# Patient Record
Sex: Female | Born: 1935
Health system: Southern US, Community
[De-identification: ages and names within clinical notes are randomized; demographics above are authoritative.]

## PROBLEM LIST (undated history)

## (undated) DIAGNOSIS — F0390 Unspecified dementia without behavioral disturbance: Secondary | ICD-10-CM

## (undated) DIAGNOSIS — I340 Nonrheumatic mitral (valve) insufficiency: Secondary | ICD-10-CM

## (undated) DIAGNOSIS — I4510 Unspecified right bundle-branch block: Secondary | ICD-10-CM

## (undated) DIAGNOSIS — I480 Paroxysmal atrial fibrillation: Secondary | ICD-10-CM

## (undated) DIAGNOSIS — I4892 Unspecified atrial flutter: Secondary | ICD-10-CM

## (undated) DIAGNOSIS — G4730 Sleep apnea, unspecified: Secondary | ICD-10-CM

## (undated) DIAGNOSIS — R0600 Dyspnea, unspecified: Secondary | ICD-10-CM

## (undated) DIAGNOSIS — I10 Essential (primary) hypertension: Secondary | ICD-10-CM

## (undated) DIAGNOSIS — J302 Other seasonal allergic rhinitis: Secondary | ICD-10-CM

## (undated) DIAGNOSIS — Z8619 Personal history of other infectious and parasitic diseases: Secondary | ICD-10-CM

## (undated) DIAGNOSIS — E7800 Pure hypercholesterolemia, unspecified: Secondary | ICD-10-CM

## (undated) DIAGNOSIS — I4891 Unspecified atrial fibrillation: Secondary | ICD-10-CM

## (undated) HISTORY — DX: Pure hypercholesterolemia, unspecified: E78.00

## (undated) HISTORY — DX: Unspecified atrial fibrillation: I48.91

## (undated) HISTORY — PX: ABDOMINAL HYSTERECTOMY: SHX81

## (undated) HISTORY — DX: Paroxysmal atrial fibrillation: I48.0

## (undated) HISTORY — DX: Unspecified atrial flutter: I48.92

## (undated) HISTORY — DX: Sleep apnea, unspecified: G47.30

## (undated) HISTORY — DX: Personal history of other infectious and parasitic diseases: Z86.19

## (undated) HISTORY — DX: Essential (primary) hypertension: I10

## (undated) HISTORY — DX: Nonrheumatic mitral (valve) insufficiency: I34.0

## (undated) HISTORY — PX: OTHER SURGICAL HISTORY: SHX169

## (undated) HISTORY — DX: Unspecified right bundle-branch block: I45.10

## (undated) HISTORY — DX: Other seasonal allergic rhinitis: J30.2

## (undated) HISTORY — DX: Dyspnea, unspecified: R06.00

---
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
## 1998-07-01 ENCOUNTER — Ambulatory Visit (HOSPITAL_COMMUNITY): Admission: RE | Admit: 1998-07-01 | Discharge: 1998-07-01 | Payer: Self-pay | Admitting: Specialist

## 1999-08-20 ENCOUNTER — Encounter: Admission: RE | Admit: 1999-08-20 | Discharge: 1999-08-20 | Payer: Self-pay | Admitting: Specialist

## 1999-08-20 ENCOUNTER — Encounter: Payer: Self-pay | Admitting: Specialist

## 1999-08-22 ENCOUNTER — Ambulatory Visit (HOSPITAL_BASED_OUTPATIENT_CLINIC_OR_DEPARTMENT_OTHER): Admission: RE | Admit: 1999-08-22 | Discharge: 1999-08-23 | Payer: Self-pay | Admitting: Specialist

## 1999-08-22 ENCOUNTER — Encounter (INDEPENDENT_AMBULATORY_CARE_PROVIDER_SITE_OTHER): Payer: Self-pay | Admitting: *Deleted

## 2006-09-09 ENCOUNTER — Ambulatory Visit: Payer: Self-pay | Admitting: Internal Medicine

## 2007-10-18 ENCOUNTER — Ambulatory Visit: Payer: Self-pay | Admitting: Internal Medicine

## 2007-10-20 ENCOUNTER — Ambulatory Visit: Payer: Self-pay | Admitting: Internal Medicine

## 2008-03-27 ENCOUNTER — Ambulatory Visit: Payer: Self-pay | Admitting: Internal Medicine

## 2008-03-31 ENCOUNTER — Ambulatory Visit: Payer: Self-pay | Admitting: Internal Medicine

## 2008-05-10 ENCOUNTER — Ambulatory Visit: Payer: Self-pay | Admitting: Internal Medicine

## 2008-12-31 ENCOUNTER — Ambulatory Visit: Payer: Self-pay | Admitting: Internal Medicine

## 2009-05-28 ENCOUNTER — Ambulatory Visit: Payer: Self-pay | Admitting: Internal Medicine

## 2010-03-06 ENCOUNTER — Inpatient Hospital Stay (HOSPITAL_COMMUNITY): Admission: EM | Admit: 2010-03-06 | Discharge: 2010-03-07 | Payer: Self-pay | Admitting: Emergency Medicine

## 2010-03-07 ENCOUNTER — Encounter (INDEPENDENT_AMBULATORY_CARE_PROVIDER_SITE_OTHER): Payer: Self-pay | Admitting: Emergency Medicine

## 2010-04-02 ENCOUNTER — Ambulatory Visit: Payer: Self-pay | Admitting: Cardiology

## 2010-06-06 ENCOUNTER — Ambulatory Visit: Payer: Self-pay | Admitting: Internal Medicine

## 2010-06-17 ENCOUNTER — Ambulatory Visit: Payer: Self-pay | Admitting: Internal Medicine

## 2010-08-05 ENCOUNTER — Ambulatory Visit: Payer: Self-pay | Admitting: Cardiology

## 2010-11-01 LAB — POCT I-STAT, CHEM 8
BUN: 10 mg/dL (ref 6–23)
Calcium, Ion: 1.07 mmol/L — ABNORMAL LOW (ref 1.12–1.32)
Chloride: 110 mEq/L (ref 96–112)
Creatinine, Ser: 0.7 mg/dL (ref 0.4–1.2)
Glucose, Bld: 105 mg/dL — ABNORMAL HIGH (ref 70–99)
HCT: 45 % (ref 36.0–46.0)
Hemoglobin: 15.3 g/dL — ABNORMAL HIGH (ref 12.0–15.0)
Potassium: 3.9 mEq/L (ref 3.5–5.1)
Sodium: 140 mEq/L (ref 135–145)
TCO2: 23 mmol/L (ref 0–100)

## 2010-11-01 LAB — CK TOTAL AND CKMB (NOT AT ARMC)
CK, MB: 1 ng/mL (ref 0.3–4.0)
Relative Index: INVALID (ref 0.0–2.5)

## 2010-11-01 LAB — LIPID PANEL
Cholesterol: 148 mg/dL (ref 0–200)
HDL: 39 mg/dL — ABNORMAL LOW (ref >39)
LDL Cholesterol: 71 mg/dL (ref 0–99)
Triglycerides: 188 mg/dL — ABNORMAL HIGH (ref <150)

## 2010-11-01 LAB — POCT CARDIAC MARKERS
CKMB, poc: <1 ng/mL — ABNORMAL LOW (ref 1.0–8.0)
Myoglobin, poc: 69.2 ng/mL (ref 12–200)
Troponin i, poc: <0.05 ng/mL (ref 0.00–0.09)

## 2010-11-01 LAB — CARDIAC PANEL(CRET KIN+CKTOT+MB+TROPI)
CK, MB: 0.8 ng/mL (ref 0.3–4.0)
CK, MB: 0.9 ng/mL (ref 0.3–4.0)
Total CK: 48 U/L (ref 7–177)
Troponin I: 0.01 ng/mL (ref 0.00–0.06)

## 2010-11-01 LAB — CBC
HCT: 42.5 % (ref 36.0–46.0)
HCT: 44.6 % (ref 36.0–46.0)
Hemoglobin: 14.6 g/dL (ref 12.0–15.0)
Hemoglobin: 15.6 g/dL — ABNORMAL HIGH (ref 12.0–15.0)
MCH: 32.4 pg (ref 26.0–34.0)
MCH: 32.7 pg (ref 26.0–34.0)
MCHC: 35 g/dL (ref 30.0–36.0)
MCV: 93.6 fL (ref 78.0–100.0)
MCV: 93.9 fL (ref 78.0–100.0)
Platelets: 181 10*3/uL (ref 150–400)
Platelets: 187 10*3/uL (ref 150–400)
RBC: 4.52 MIL/uL (ref 3.87–5.11)
RBC: 4.76 MIL/uL (ref 3.87–5.11)
RDW: 12.5 % (ref 11.5–15.5)
WBC: 6.1 10*3/uL (ref 4.0–10.5)
WBC: 7 10*3/uL (ref 4.0–10.5)

## 2010-11-01 LAB — COMPREHENSIVE METABOLIC PANEL
ALT: 33 U/L (ref 0–35)
AST: 35 U/L (ref 0–37)
Albumin: 3.9 g/dL (ref 3.5–5.2)
CO2: 21 mEq/L (ref 19–32)
Calcium: 8.9 mg/dL (ref 8.4–10.5)
Creatinine, Ser: 0.64 mg/dL (ref 0.4–1.2)
GFR calc Af Amer: >60 mL/min (ref >60)
Sodium: 137 mEq/L (ref 135–145)
Total Protein: 6.5 g/dL (ref 6.0–8.3)

## 2011-03-30 ENCOUNTER — Telehealth: Payer: Self-pay | Admitting: Cardiology

## 2011-03-30 NOTE — Telephone Encounter (Signed)
Called wanting to know when her app should be. Lm that she was suppose to have been seen in June. LM that she needs an app for Nov or Dec as a 57yr.

## 2011-03-30 NOTE — Telephone Encounter (Signed)
Please call pt back whether she needs to make an appt for a 6 M OV soon or should she wait until a year, pt states was last seen in December, chart in box

## 2011-04-01 ENCOUNTER — Other Ambulatory Visit: Payer: Self-pay | Admitting: Cardiology

## 2011-04-01 NOTE — Telephone Encounter (Signed)
escribe medication per fax request  

## 2011-06-29 ENCOUNTER — Ambulatory Visit: Payer: Self-pay | Admitting: Cardiology

## 2011-07-14 ENCOUNTER — Ambulatory Visit: Payer: Self-pay | Admitting: Internal Medicine

## 2011-07-22 ENCOUNTER — Encounter: Payer: Self-pay | Admitting: Cardiology

## 2011-07-22 ENCOUNTER — Ambulatory Visit (INDEPENDENT_AMBULATORY_CARE_PROVIDER_SITE_OTHER): Payer: Medicare Other | Admitting: Cardiology

## 2011-07-22 VITALS — BP 122/68 | HR 74 | Ht 66.0 in | Wt 191.0 lb

## 2011-07-22 DIAGNOSIS — E7800 Pure hypercholesterolemia, unspecified: Secondary | ICD-10-CM

## 2011-07-22 DIAGNOSIS — I480 Paroxysmal atrial fibrillation: Secondary | ICD-10-CM

## 2011-07-22 DIAGNOSIS — I4510 Unspecified right bundle-branch block: Secondary | ICD-10-CM

## 2011-07-22 DIAGNOSIS — I10 Essential (primary) hypertension: Secondary | ICD-10-CM

## 2011-07-22 DIAGNOSIS — I4891 Unspecified atrial fibrillation: Secondary | ICD-10-CM

## 2011-07-22 NOTE — Assessment & Plan Note (Signed)
Blood pressure is well controlled today. She will continue with her current medications.

## 2011-07-22 NOTE — Assessment & Plan Note (Signed)
No known recurrence of the atrial fibrillation since July of 2011. It is interesting to note that she has been diagnosed with sleep apnea and this may potentially have been a trigger for her atrophic fibrillation. We will continue with her diltiazem therapy. I'll followup again in one month.

## 2011-07-22 NOTE — Patient Instructions (Signed)
You need to get more aerobic exercise.  Continue your current medications.  I will see you again in 1 year

## 2011-07-22 NOTE — Progress Notes (Signed)
   Ned Clines Date of Birth: November 03, 1933 Medical Record #[id number]  History of Present Illness: Mrs. Pettitt is seen for yearly followup. She has a history of paroxysmal atrial fibrillation. Her last known occurrence was in July of 2011. She also has a history of hypertension and hypercholesterolemia. Since her visit last year she has been diagnosed with sleep apnea. She is now CPAP and oxygen therapy at night. She reports some chest and sinus congestion related to this. She has had no symptoms of palpitations or tachycardia. She has noted some periods of shortness of breath over the past couple of weeks but this resolves with Xanax. She has been under a lot of stress at work. She did have an echocardiogram in July of 2011 which was normal except for mild mitral insufficiency.  Current Outpatient Prescriptions on File Prior to Visit  Medication Sig Dispense Refill  . diltiazem (CARDIZEM CD) 240 MG 24 hr capsule TAKE 1 CAPSULE EVERY DAY  30 capsule  5    Allergies  Allergen Reactions  . Codeine     Past Medical History  Diagnosis Date  . Atrial fibrillation     paroxysmal  . Hypertension   . Hypercholesterolemia   . Mitral insufficiency     Mild mitral insufficiency  . Dyspnea   . Valvular heart disease     of unknown type  . Seasonal allergies   . Sleep apnea     Past Surgical History  Procedure Date  . Abdominal hysterectomy   . Tummy tuck     History  Smoking status  . Never Smoker   Smokeless tobacco  . Not on file    History  Alcohol Use     Family History  Problem Relation Age of Onset  . Stroke Mother   . Heart attack Mother   . Diabetes Sister     Review of Systems: As noted in history of present illness.  All other systems were reviewed and are negative.  Physical Exam: BP 122/68  Pulse 74  Ht 5\' 6"  (1.676 m)  Wt 86.637 kg (191 lb)  BMI 30.83 kg/m2 She is an overweight white female in no acute distress.The patient is alert and oriented  x 3.  The mood and affect are normal.  The skin is warm and dry.  Color is normal.  The HEENT exam reveals that the sclera are nonicteric.  The mucous membranes are moist.  The carotids are 2+ without bruits.  There is no thyromegaly.  There is no JVD.  The lungs are clear.  The chest wall is non tender.  The heart exam reveals a regular rate with a normal S1 and S2.  There are no murmurs, gallops, or rubs.  The PMI is not displaced.   Abdominal exam reveals good bowel sounds.  There is no guarding or rebound.  There is no hepatosplenomegaly or tenderness.  There are no masses.  Exam of the legs reveal no clubbing, cyanosis, or edema.  The legs are without rashes.  The distal pulses are intact.  Cranial nerves II - XII are intact.  Motor and sensory functions are intact.  The gait is normal.  LABORATORY DATA:  ECG demonstrates normal sinus rhythm with a chronic right bundle branch block. Assessment / Plan:

## 2011-10-09 ENCOUNTER — Telehealth: Payer: Self-pay | Admitting: Cardiology

## 2011-10-09 NOTE — Telephone Encounter (Signed)
New Problem   Patient request call concerning RX given to her by Chiropractor, she can be reached at hm# [phone]

## 2011-10-09 NOTE — Telephone Encounter (Signed)
Patient called, stated wanted to check with Dr.Jordan to see if okay to take 2 new meds prescribed by her chiropractor.Alpha-Lipoic Acid 100mg  3 times daily,and QH Absorbs 1 tablet 3 times daily (reduce form of Co Q 10) Advised will check with Dr.Jordan and call her back.

## 2011-10-13 NOTE — Telephone Encounter (Signed)
Patient called was told spoke with Dr.Jordan okay to take Alpha-lipoic acid and QH absorbs.

## 2011-10-28 ENCOUNTER — Other Ambulatory Visit: Payer: Self-pay

## 2011-10-28 MED ORDER — DILTIAZEM HCL ER COATED BEADS 240 MG PO CP24: 240.0000 mg | ORAL_CAPSULE | Freq: Every day | ORAL | Status: DC

## 2012-03-17 ENCOUNTER — Emergency Department: Payer: Self-pay | Admitting: *Deleted

## 2012-03-17 LAB — CBC
HCT: 39.3 % (ref 35.0–47.0)
MCV: 91 fL (ref 80–100)
Platelet: 202 10*3/uL (ref 150–440)
RDW: 12.5 % (ref 11.5–14.5)
WBC: 6.5 10*3/uL (ref 3.6–11.0)

## 2012-03-17 LAB — COMPREHENSIVE METABOLIC PANEL
Anion Gap: 9 (ref 7–16)
Calcium, Total: 9.3 mg/dL (ref 8.5–10.1)
Chloride: 102 mmol/L (ref 98–107)
Co2: 27 mmol/L (ref 21–32)
EGFR (African American): >60
EGFR (Non-African Amer.): 53 — ABNORMAL LOW
Glucose: 96 mg/dL (ref 65–99)
Potassium: 3.8 mmol/L (ref 3.5–5.1)
SGOT(AST): 36 U/L (ref 15–37)
Sodium: 138 mmol/L (ref 136–145)

## 2012-03-17 LAB — TROPONIN I: Troponin-I: <0.02 ng/mL

## 2012-05-04 ENCOUNTER — Emergency Department: Payer: Self-pay | Admitting: Emergency Medicine

## 2012-05-05 LAB — CBC WITH DIFFERENTIAL/PLATELET
Basophil #: 0.1 10*3/uL (ref 0.0–0.1)
Basophil %: 1.1 %
Eosinophil #: 0.3 10*3/uL (ref 0.0–0.7)
Eosinophil %: 5.9 %
HGB: 14.9 g/dL (ref 12.0–16.0)
Lymphocyte #: 1.2 10*3/uL (ref 1.0–3.6)
MCH: 32.8 pg (ref 26.0–34.0)
MCHC: 35.5 g/dL (ref 32.0–36.0)
MCV: 93 fL (ref 80–100)
Monocyte #: 0.5 x10 3/mm (ref 0.2–0.9)
Neutrophil %: 59.8 %
Platelet: 193 10*3/uL (ref 150–440)
RBC: 4.53 10*6/uL (ref 3.80–5.20)
RDW: 12.7 % (ref 11.5–14.5)
WBC: 5.2 10*3/uL (ref 3.6–11.0)

## 2012-05-05 LAB — CK TOTAL AND CKMB (NOT AT ARMC)
CK, Total: 37 U/L (ref 21–215)
CK-MB: <0.5 ng/mL — ABNORMAL LOW (ref 0.5–3.6)

## 2012-05-05 LAB — PROTIME-INR
INR: 1
Prothrombin Time: 13.2 secs (ref 11.5–14.7)

## 2012-05-05 LAB — URINALYSIS, COMPLETE
Glucose,UR: NEGATIVE mg/dL (ref 0–75)
Nitrite: NEGATIVE
Ph: 6 (ref 4.5–8.0)
Protein: NEGATIVE
RBC,UR: 8 /HPF (ref 0–5)
WBC UR: 8 /HPF (ref 0–5)

## 2012-05-05 LAB — COMPREHENSIVE METABOLIC PANEL
Alkaline Phosphatase: 81 U/L (ref 50–136)
Anion Gap: 9 (ref 7–16)
BUN: 13 mg/dL (ref 7–18)
Bilirubin,Total: 0.4 mg/dL (ref 0.2–1.0)
Calcium, Total: 9.6 mg/dL (ref 8.5–10.1)
Chloride: 104 mmol/L (ref 98–107)
Co2: 29 mmol/L (ref 21–32)
EGFR (Non-African Amer.): >60
Glucose: 126 mg/dL — ABNORMAL HIGH (ref 65–99)
Osmolality: 285 (ref 275–301)
Potassium: 3.6 mmol/L (ref 3.5–5.1)
Sodium: 142 mmol/L (ref 136–145)

## 2012-05-05 LAB — TROPONIN I: Troponin-I: <0.02 ng/mL

## 2012-07-19 ENCOUNTER — Ambulatory Visit: Payer: Self-pay | Admitting: Physician Assistant

## 2012-08-16 ENCOUNTER — Telehealth: Payer: Self-pay

## 2012-08-16 NOTE — Telephone Encounter (Signed)
Patient called was told received refill request for HCT 12.5 mg from [facility].Patient stated she is not taking.Also patient was told needs appointment with Dr.Jordan.Appointment scheduled with Dr.Jordan 09/14/12.

## 2012-09-14 ENCOUNTER — Ambulatory Visit: Payer: Medicare Other | Admitting: Cardiology

## 2013-05-14 ENCOUNTER — Inpatient Hospital Stay: Payer: Self-pay | Admitting: Internal Medicine

## 2013-05-14 DIAGNOSIS — R0602 Shortness of breath: Secondary | ICD-10-CM

## 2013-05-14 DIAGNOSIS — I4892 Unspecified atrial flutter: Secondary | ICD-10-CM

## 2013-05-14 DIAGNOSIS — I509 Heart failure, unspecified: Secondary | ICD-10-CM

## 2013-05-14 DIAGNOSIS — I10 Essential (primary) hypertension: Secondary | ICD-10-CM

## 2013-05-14 DIAGNOSIS — I059 Rheumatic mitral valve disease, unspecified: Secondary | ICD-10-CM

## 2013-05-14 LAB — BASIC METABOLIC PANEL
Anion Gap: 3 — ABNORMAL LOW (ref 7–16)
Chloride: 107 mmol/L (ref 98–107)
Creatinine: 1.04 mg/dL (ref 0.60–1.30)
EGFR (African American): >60

## 2013-05-14 LAB — CK TOTAL AND CKMB (NOT AT ARMC)
CK, Total: 71 U/L (ref 21–215)
CK-MB: 1.9 ng/mL (ref 0.5–3.6)

## 2013-05-14 LAB — CBC
HGB: 15.4 g/dL (ref 12.0–16.0)
MCH: 30.8 pg (ref 26.0–34.0)
MCHC: 33.2 g/dL (ref 32.0–36.0)
MCV: 93 fL (ref 80–100)
Platelet: 183 10*3/uL (ref 150–440)
RBC: 4.99 10*6/uL (ref 3.80–5.20)
RDW: 13.7 % (ref 11.5–14.5)
WBC: 5.7 10*3/uL (ref 3.6–11.0)

## 2013-05-14 LAB — MAGNESIUM: Magnesium: 1.6 mg/dL — ABNORMAL LOW

## 2013-05-14 LAB — TROPONIN I
Troponin-I: <0.02 ng/mL
Troponin-I: <0.02 ng/mL
Troponin-I: <0.02 ng/mL

## 2013-05-14 LAB — CK
CK, Total: 45 U/L (ref 21–215)
CK, Total: 42 U/L (ref 21–215)

## 2013-05-14 LAB — PRO B NATRIURETIC PEPTIDE: B-Type Natriuretic Peptide: 2418 pg/mL — ABNORMAL HIGH (ref 0–450)

## 2013-05-14 LAB — TSH: Thyroid Stimulating Horm: 2.24 u[IU]/mL

## 2013-05-15 LAB — CBC WITH DIFFERENTIAL/PLATELET
Basophil %: 1.3 %
Eosinophil %: 5.1 %
HCT: 43.6 % (ref 35.0–47.0)
HGB: 14.6 g/dL (ref 12.0–16.0)
MCH: 30.9 pg (ref 26.0–34.0)
MCHC: 33.5 g/dL (ref 32.0–36.0)
MCV: 92 fL (ref 80–100)
Monocyte #: 0.4 x10 3/mm (ref 0.2–0.9)
Monocyte %: 9.1 %
Neutrophil #: 2.6 10*3/uL (ref 1.4–6.5)
Neutrophil %: 53.7 %
Platelet: 171 10*3/uL (ref 150–440)
RBC: 4.73 10*6/uL (ref 3.80–5.20)

## 2013-05-15 LAB — MAGNESIUM: Magnesium: 1.9 mg/dL

## 2013-05-15 LAB — HEPATIC FUNCTION PANEL A (ARMC)
Albumin: 3.6 g/dL (ref 3.4–5.0)
SGPT (ALT): 31 U/L (ref 12–78)
Total Protein: 7 g/dL (ref 6.4–8.2)

## 2013-05-15 LAB — BASIC METABOLIC PANEL
BUN: 11 mg/dL (ref 7–18)
Calcium, Total: 9.5 mg/dL (ref 8.5–10.1)
Chloride: 105 mmol/L (ref 98–107)
Co2: 30 mmol/L (ref 21–32)
Creatinine: 1.25 mg/dL (ref 0.60–1.30)
EGFR (African American): 48 — ABNORMAL LOW
EGFR (Non-African Amer.): 41 — ABNORMAL LOW
Glucose: 92 mg/dL (ref 65–99)
Potassium: 3.7 mmol/L (ref 3.5–5.1)
Sodium: 139 mmol/L (ref 136–145)

## 2013-05-16 LAB — LIPASE, BLOOD: Lipase: 349 U/L (ref 73–393)

## 2013-05-17 DIAGNOSIS — I4891 Unspecified atrial fibrillation: Secondary | ICD-10-CM

## 2013-05-26 ENCOUNTER — Encounter: Payer: Self-pay | Admitting: Cardiovascular Disease

## 2013-05-26 ENCOUNTER — Ambulatory Visit (INDEPENDENT_AMBULATORY_CARE_PROVIDER_SITE_OTHER): Payer: Medicare Other | Admitting: Cardiovascular Disease

## 2013-05-26 VITALS — BP 120/62 | HR 51 | Ht 66.5 in | Wt 173.2 lb

## 2013-05-26 DIAGNOSIS — E7800 Pure hypercholesterolemia, unspecified: Secondary | ICD-10-CM

## 2013-05-26 DIAGNOSIS — R0989 Other specified symptoms and signs involving the circulatory and respiratory systems: Secondary | ICD-10-CM

## 2013-05-26 DIAGNOSIS — R0602 Shortness of breath: Secondary | ICD-10-CM

## 2013-05-26 DIAGNOSIS — I10 Essential (primary) hypertension: Secondary | ICD-10-CM

## 2013-05-26 DIAGNOSIS — E785 Hyperlipidemia, unspecified: Secondary | ICD-10-CM

## 2013-05-26 DIAGNOSIS — R000 Tachycardia, unspecified: Secondary | ICD-10-CM

## 2013-05-26 DIAGNOSIS — I4891 Unspecified atrial fibrillation: Secondary | ICD-10-CM

## 2013-05-26 NOTE — Assessment & Plan Note (Signed)
Blood pressure is well controlled on today's visit. No changes made to the medications. 

## 2013-05-26 NOTE — Assessment & Plan Note (Signed)
We have suggested that she had lab work done at her convenience given strong family history of CAD and PVD.

## 2013-05-26 NOTE — Assessment & Plan Note (Signed)
She is maintaining normal sinus rhythm today. Suspect recent episode was triggered by upper respiratory infection. We have suggested she cut her metoprolol succinate in half and take a half pill twice a day , decrease amiodarone down to 1 pill per day, hold her digoxin and stay on her anticoagulation for one more month. We have suggested she closely monitor her heart rate at home. If she has additional episodes of tachycardia concerning for atrial fibrillation or flutter, that she call he office.

## 2013-05-26 NOTE — Progress Notes (Signed)
Patient ID: Jill Snow, female    DOB: 1933/09/16, 79 y.o.   MRN: [id number]  HPI Comments: Jill Snow has a history of paroxysmal atrial fibrillation, episode in July 2011 , also history of hypertension and hypercholesterolemia, diagnosed with with sleep apnea, uses nasal pillow CPAP and oxygen therapy at night.  Recent hospitalization 05/14/2013 for palpitations, shortness of breath with tachycardia. Jill Snow had upper respiratory infection and was found to be in atrial flutter. Jill Snow was given IV Cardizem with minimal improvement of her heart rate. Jill Snow was hypotensive and required several days in the hospital for management. Jill Snow ended up needing TEE and cardioversion as rate was difficult to control despite metoprolol, aspirin channel blocker and digoxin. Jill Snow was started on anticoagulation at that time.   Jill Snow presents in followup today and reports that Jill Snow is feeling better. Jill Snow was initially week and now is back to work, feeling more like herself Jill Snow has not been measuring her heart rate or blood pressure at home Jill Snow did have some confusion while in the hospital. This has resolved at home.  TEE dated 05/16/2013 showed ejection fraction 45-50%, mildly dilated left and right atrium, moderate mitral valve regurgitation noted  Chest x-ray in the hospital did show small bilateral pleural effusions  EKG today shows sinus bradycardia, right bundle branch block, rate 51 beats per minute, nonspecific ST and T wave abnormality in lead 3, aVF   Outpatient Encounter Prescriptions as of 05/26/2013  Medication Sig Dispense Refill  . ALPRAZolam (XANAX) 0.25 MG tablet Take 0.25 mg by mouth at bedtime as needed.        Marland Kitchen amiodarone (PACERONE) 200 MG tablet Take 200 mg by mouth 2 (two) times daily.      Marland Kitchen apixaban (ELIQUIS) 5 MG TABS tablet Take 5 mg by mouth every 12 (twelve) hours.      . fenofibrate (TRICOR) 145 MG tablet Take 145 mg by mouth daily.      . fluticasone (FLONASE) 50 MCG/ACT nasal  spray Place 2 sprays into the nose daily.      . furosemide (LASIX) 20 MG tablet Take 20 mg by mouth as directed. [place], [place]. And Fri.      . metoprolol succinate (TOPROL-XL) 50 MG 24 hr tablet Take 50 mg by mouth daily. Take with or immediately following a meal.      . montelukast (SINGULAIR) 10 MG tablet Take 10 mg by mouth at bedtime.      . digoxin (LANOXIN) 0.125 MG tablet Take 0.125 mg by mouth daily.        Review of Systems  Constitutional: Negative.   HENT: Negative.   Eyes: Negative.   Respiratory: Negative.   Cardiovascular: Negative.   Gastrointestinal: Negative.   Endocrine: Negative.   Musculoskeletal: Negative.   Skin: Negative.   Allergic/Immunologic: Negative.   Neurological: Negative.   Hematological: Negative.   Psychiatric/Behavioral: Negative.     BP 120/62  Pulse 51  Ht 5' 6.5" (1.689 m)  Wt 173 lb 4 oz (78.586 kg)  BMI 27.55 kg/m2  Physical Exam  Nursing note and vitals reviewed. Constitutional: Jill Snow is oriented to person, place, and time. Jill Snow appears well-developed and well-nourished.  HENT:  Head: Normocephalic.  Nose: Nose normal.  Mouth/Throat: Oropharynx is clear and moist.  Eyes: Conjunctivae are normal. Pupils are equal, round, and reactive to light.  Neck: Normal range of motion. Neck supple. No JVD present. Carotid bruit is present.  Cardiovascular: Normal rate, regular rhythm, S1 normal, S2  normal, normal heart sounds and intact distal pulses.  Exam reveals no gallop and no friction rub.   No murmur heard. Pulmonary/Chest: Effort normal and breath sounds normal. No respiratory distress. Jill Snow has no wheezes. Jill Snow has no rales. Jill Snow exhibits no tenderness.  Abdominal: Soft. Bowel sounds are normal. Jill Snow exhibits no distension. There is no tenderness.  Musculoskeletal: Normal range of motion. Jill Snow exhibits no edema and no tenderness.  Lymphadenopathy:    Jill Snow has no cervical adenopathy.  Neurological: Jill Snow is alert and oriented to person, place,  and time. Coordination normal.  Skin: Skin is warm and dry. No rash noted. No erythema.  Psychiatric: Jill Snow has a normal mood and affect. Her behavior is normal. Judgment and thought content normal.    Assessment and Plan

## 2013-05-26 NOTE — Patient Instructions (Addendum)
You are doing well. Please cut the metoprolol and take 1/2 in the am and 1/2 in the PM Decrease the amiodarone to one a day Stop the digoxin Stay on the eliquis for one more month  Call the office if you have tachycardia/fast heart rate  We will schedule you for a carotid ultrasound  Please call us if you have new issues that need to be addressed before your next appt.  Your physician wants you to follow-up in: 6 months.  You will receive a reminder letter in the mail two months in advance. If you don't receive a letter, please call our office to schedule the follow-up appointment.

## 2013-05-29 ENCOUNTER — Ambulatory Visit: Payer: Medicare Other

## 2013-05-29 ENCOUNTER — Ambulatory Visit (INDEPENDENT_AMBULATORY_CARE_PROVIDER_SITE_OTHER): Payer: Medicare Other | Admitting: *Deleted

## 2013-05-29 DIAGNOSIS — E785 Hyperlipidemia, unspecified: Secondary | ICD-10-CM

## 2013-05-29 DIAGNOSIS — I4891 Unspecified atrial fibrillation: Secondary | ICD-10-CM

## 2013-05-29 NOTE — Progress Notes (Signed)
Pt came in today complaining of "no energy" and low heart rate.  Pt's metoprolol was decreased on her office visit 05/26/13 from 50mg  once daily to 1/2 tab in am and 1/2 tab in pm. Pt states that symptoms have not improved over the weekend.  EKG shows heart rate at 52. Spoke w/ Dr. Mariah Milling and instructed pt to decrease her metoprolol succinate down to 1/2 pill in the evening. Pt will monitor her symptoms and heart rate and will let us know if symptoms have not improved by next week.

## 2013-05-30 ENCOUNTER — Encounter: Payer: Medicare Other | Admitting: Cardiovascular Disease

## 2013-05-30 ENCOUNTER — Telehealth: Payer: Self-pay | Admitting: *Deleted

## 2013-05-30 LAB — LIPID PANEL
Cholesterol, Total: 224 mg/dL — ABNORMAL HIGH (ref 100–199)
Triglycerides: 472 mg/dL — ABNORMAL HIGH (ref 0–149)

## 2013-05-30 NOTE — Telephone Encounter (Signed)
Patient wanted to check with Dr. Mariah Milling and make sure it was ok for her to receive the flu shot?

## 2013-05-30 NOTE — Telephone Encounter (Signed)
Spoke w/ pt.  Encouraged her to seek the flu shot.   Told her to answer questions appropriately before getting it.

## 2013-06-02 ENCOUNTER — Other Ambulatory Visit: Payer: Self-pay | Admitting: Physician Assistant

## 2013-06-02 ENCOUNTER — Telehealth: Payer: Self-pay

## 2013-06-02 DIAGNOSIS — E785 Hyperlipidemia, unspecified: Secondary | ICD-10-CM

## 2013-06-02 MED ORDER — ATORVASTATIN CALCIUM 10 MG PO TABS: 10.0000 mg | ORAL_TABLET | Freq: Every day | ORAL | Status: DC

## 2013-06-02 NOTE — Telephone Encounter (Signed)
Spoke w/ pt.  She is aware of results.   She is agreeable with plan of starting atorvastatin 10 mg daily and having labs in 6 weeks.

## 2013-06-02 NOTE — Telephone Encounter (Signed)
Message copied by Marilynne Halsted on Fri Jun 02, 2013  4:02 PM ------      Message from: Odella Aquas A      Created: Fri Jun 02, 2013  3:46 PM       Markedly elevated TGs despite fibrate use. Given likely atherosclerosis (+ carotid bruit on exam per recent office note) would recommend adding at least moderate potency statin. Start atorvastatin 10mg  daily. Check LFTs and repeat lipid panel in 6 weeks. ------

## 2013-06-05 ENCOUNTER — Encounter: Payer: Self-pay | Admitting: *Deleted

## 2013-06-12 ENCOUNTER — Encounter: Payer: Self-pay | Admitting: *Deleted

## 2013-06-13 ENCOUNTER — Encounter: Payer: Self-pay | Admitting: Cardiovascular Disease

## 2013-06-22 ENCOUNTER — Encounter (INDEPENDENT_AMBULATORY_CARE_PROVIDER_SITE_OTHER): Payer: Medicare Other

## 2013-06-22 DIAGNOSIS — I6529 Occlusion and stenosis of unspecified carotid artery: Secondary | ICD-10-CM

## 2013-06-22 DIAGNOSIS — R0989 Other specified symptoms and signs involving the circulatory and respiratory systems: Secondary | ICD-10-CM

## 2013-06-26 ENCOUNTER — Telehealth: Payer: Self-pay

## 2013-06-26 NOTE — Telephone Encounter (Signed)
Message copied by Marilynne Halsted on Mon Jun 26, 2013  4:22 PM ------      Message from: Antonieta Iba      Created: Sat Jun 24, 2013  9:16 PM       Mild carotid disease bilateral  1 - 39%      Medical management ------

## 2013-06-26 NOTE — Telephone Encounter (Signed)
Spoke w/ pt.  She is aware of results and is agreeable to plan.  Reports that she finished her Eliquis and per Dr. Windell Hummingbird instructions on her last visit, she will not have that refilled.

## 2013-07-10 ENCOUNTER — Ambulatory Visit (INDEPENDENT_AMBULATORY_CARE_PROVIDER_SITE_OTHER): Payer: Medicare Other | Admitting: Internal Medicine

## 2013-07-10 ENCOUNTER — Encounter: Payer: Self-pay | Admitting: Internal Medicine

## 2013-07-10 VITALS — BP 126/78 | HR 72 | Temp 98.0°F | Ht 64.25 in | Wt 176.2 lb

## 2013-07-10 DIAGNOSIS — H6122 Impacted cerumen, left ear: Secondary | ICD-10-CM

## 2013-07-10 DIAGNOSIS — E7800 Pure hypercholesterolemia, unspecified: Secondary | ICD-10-CM

## 2013-07-10 DIAGNOSIS — H6120 Impacted cerumen, unspecified ear: Secondary | ICD-10-CM

## 2013-07-10 DIAGNOSIS — I10 Essential (primary) hypertension: Secondary | ICD-10-CM

## 2013-07-10 DIAGNOSIS — I4891 Unspecified atrial fibrillation: Secondary | ICD-10-CM

## 2013-07-10 NOTE — Assessment & Plan Note (Signed)
Lipids reviewed

## 2013-07-10 NOTE — Progress Notes (Signed)
Pre-visit discussion using our clinic review tool. No additional management support is needed unless otherwise documented below in the visit note.  

## 2013-07-10 NOTE — Assessment & Plan Note (Signed)
Well controlled Continue current therapy 

## 2013-07-10 NOTE — Patient Instructions (Signed)
Cerumen Impaction A cerumen impaction is when the wax in your ear forms a plug. This plug usually causes reduced hearing. Sometimes it also causes an earache or dizziness. Removing a cerumen impaction can be difficult and painful. The wax sticks to the ear canal. The canal is sensitive and bleeds easily. If you try to remove a heavy wax buildup with a cotton tipped swab, you may push it in further. Irrigation with water, suction, and small ear curettes may be used to clear out the wax. If the impaction is fixed to the skin in the ear canal, ear drops may be needed for a few days to loosen the wax. People who build up a lot of wax frequently can use ear wax removal products available in your local drugstore. SEEK MEDICAL CARE IF:  You develop an earache, increased hearing loss, or marked dizziness. Document Released: 09/10/2004 Document Revised: 10/26/2011 Document Reviewed: 10/31/2009 ExitCare Patient Information 2014 [employer].  

## 2013-07-10 NOTE — Progress Notes (Signed)
HPI  Pt presents to the clinic today to establish care. She is transferring care from Dr. Welton Flakes. She was dismissed from that practice for failure to comply with there treatment regimen. The last time she was seen there was 04/2013. She does see Dr. Mariah Milling for her heart related conditions  Flu: yearly 05/2013 Tetanus: unsure of date Pneumovax: unsure of date Pap Smear: 04/2013 Mammogram: due 07/2013 [facility]) Colonoscopy: never  Eye Doctor: as needed (2012) Dentist: biannually  Past Medical History  Diagnosis Date  . Atrial fibrillation     paroxysmal  . Hypertension   . Hypercholesterolemia   . Mitral insufficiency     Mild mitral insufficiency  . Dyspnea   . Seasonal allergies   . Sleep apnea   . RBBB (right bundle branch block)   . History of chicken pox   . Arrhythmia     Current Outpatient Prescriptions  Medication Sig Dispense Refill  . atorvastatin (LIPITOR) 10 MG tablet Take 1 tablet (10 mg total) by mouth daily.  90 tablet  3  . fluticasone (FLONASE) 50 MCG/ACT nasal spray Place 2 sprays into the nose daily.      . furosemide (LASIX) 20 MG tablet Take 20 mg by mouth as directed. [place], [place]. And Fri.      . ALPRAZolam (XANAX) 0.25 MG tablet Take 0.25 mg by mouth at bedtime as needed.        Marland Kitchen amiodarone (PACERONE) 200 MG tablet Take 200 mg by mouth 2 (two) times daily.      Marland Kitchen apixaban (ELIQUIS) 5 MG TABS tablet Take 5 mg by mouth every 12 (twelve) hours.      . fenofibrate (TRICOR) 145 MG tablet Take 145 mg by mouth daily.      . metoprolol succinate (TOPROL-XL) 50 MG 24 hr tablet Take 50 mg by mouth daily. Take with or immediately following a meal.      . montelukast (SINGULAIR) 10 MG tablet Take 10 mg by mouth at bedtime.      Marland Kitchen zolpidem (AMBIEN) 5 MG tablet Take 5 mg by mouth at bedtime as needed.        No current facility-administered medications for this visit.    Allergies  Allergen Reactions  . Codeine     Family History  Problem Relation Age of  Onset  . Stroke Mother   . Heart attack Mother   . Arthritis Mother   . Hyperlipidemia Mother   . Heart disease Mother   . Diabetes Mother   . Diabetes Sister   . Cancer Daughter   . Heart disease Daughter     History   Social History  . Marital Status: Widowed    Spouse Name: N/A    Number of Children: 2  . Years of Education: N/A   Occupational History  . REALTOR    Social History Main Topics  . Smoking status: Never Smoker   . Smokeless tobacco: Not on file  . Alcohol Use: No  . Drug Use: No  . Sexual Activity: No   Other Topics Concern  . Not on file   Social History Narrative  . No narrative on file    ROS:  Constitutional: Denies fever, malaise, fatigue, headache or abrupt weight changes.  HEENT: Denies eye pain, eye redness, ear pain, ringing in the ears, wax buildup, runny nose, nasal congestion, bloody nose, or sore throat. Respiratory: Pt reports cough. Denies difficulty breathing, shortness of breath, or sputum production.   Cardiovascular: Denies  chest pain, chest tightness, palpitations or swelling in the hands or feet.  Gastrointestinal: Denies abdominal pain, bloating, constipation, diarrhea or blood in the stool.  GU: Denies frequency, urgency, pain with urination, blood in urine, odor or discharge. Musculoskeletal: Denies decrease in range of motion, difficulty with gait, muscle pain or joint pain and swelling.  Skin: Denies redness, rashes, lesions or ulcercations.  Neurological: Denies dizziness, difficulty with memory, difficulty with speech or problems with balance and coordination.   No other specific complaints in a complete review of systems (except as listed in HPI above).  PE:  BP 126/78  Pulse 72  Temp(Src) 98 F (36.7 C) (Oral)  Ht 5' 4.25" (1.632 m)  Wt 176 lb 4 oz (79.946 kg)  BMI 30.02 kg/m2  SpO2 97% Wt Readings from Last 3 Encounters:  07/10/13 176 lb 4 oz (79.946 kg)  05/26/13 173 lb 4 oz (78.586 kg)  07/22/11 191 lb  (86.637 kg)    General: Appears her stated age, well developed, well nourished in NAD. HEENT: Head: normal shape and size; Eyes: sclera white, no icterus, conjunctiva pink, PERRLA and EOMs intact; Ears: Tm's gray and intact, normal light reflex; Nose: mucosa pink and moist, septum midline; Throat/Mouth: Teeth present, mucosa pink and moist, no lesions or ulcerations noted.  Neck: Normal range of motion. Neck supple, trachea midline. No massses, lumps or thyromegaly present.  Cardiovascular: Normal rate and irregular rhythm. S1,S2 noted.  No murmur, rubs or gallops noted. No JVD or BLE edema. No carotid bruits noted. Pulmonary/Chest: Normal effort and positive vesicular breath sounds. No respiratory distress. No wheezes, rales or ronchi noted.  Abdomen: Soft and nontender. Normal bowel sounds, no bruits noted. No distention or masses noted. Liver, spleen and kidneys non palpable. Musculoskeletal: Normal range of motion. No signs of joint swelling. No difficulty with gait.  Neurological: Alert and oriented. Cranial nerves II-XII intact. Coordination normal. +DTRs bilaterally. Psychiatric: Mood and affect normal. Behavior is normal. Judgment and thought content normal.     BMET    Component Value Date/Time   NA 137 03/06/2010 1411   K 3.8 03/06/2010 1411   CL 108 03/06/2010 1411   CO2 21 03/06/2010 1411   GLUCOSE 135* 03/06/2010 1411   BUN 8 03/06/2010 1411   CREATININE 0.64 03/06/2010 1411   CALCIUM 8.9 03/06/2010 1411   GFRNONAA >60 03/06/2010 1411   GFRAA  Value: >60        The eGFR has been calculated using the MDRD equation. This calculation has not been validated in all clinical situations. eGFR's persistently <60 mL/min signify possible Chronic Kidney Disease. 03/06/2010 1411    Lipid Panel     Component Value Date/Time   CHOL  Value: 148        ATP III CLASSIFICATION:  <200     mg/dL   Desirable  811-914  mg/dL   Borderline High  >=782    mg/dL   High        9/56/2130 0440   TRIG 472*  05/29/2013 0834   HDL 47 05/29/2013 0834   HDL 39* 03/07/2010 0440   CHOLHDL 4.8* 05/29/2013 0834   CHOLHDL 3.8 03/07/2010 0440   VLDL 38 03/07/2010 0440   LDLCALC Comment 05/29/2013 0834   LDLCALC  Value: 71        Total Cholesterol/HDL:CHD Risk Coronary Heart Disease Risk Table                     Men  Women  1/2 Average Risk   3.4   3.3  Average Risk       5.0   4.4  2 X Average Risk   9.6   7.1  3 X Average Risk  23.4   11.0        Use the calculated Patient Ratio above and the CHD Risk Table to determine the patient's CHD Risk.        ATP III CLASSIFICATION (LDL):  <100     mg/dL   Optimal  454-098  mg/dL   Near or Above                    Optimal  130-159  mg/dL   Borderline  119-147  mg/dL   High  >829     mg/dL   Very High 5/62/1308 6578    CBC    Component Value Date/Time   WBC 6.1 03/06/2010 1710   RBC 4.52 03/06/2010 1710   HGB 14.6 03/06/2010 1710   HCT 42.5 03/06/2010 1710   PLT 187 03/06/2010 1710   MCV 93.9 03/06/2010 1710   MCH 32.4 03/06/2010 1710   MCHC 34.5 03/06/2010 1710   RDW 12.4 03/06/2010 1710    Hgb A1C No results found for this basename: HGBA1C     Assessment and Plan:  Preventative Health Maintenance:  Will order your mammogram She declines colonoscopy She will hold on Tdap as Medicare will not pay for it She will find out if she has had a pneumonia shot Encouraged pt to visit eye doctor annually  Left cerumen impaction:  Irrigated by CMA  RTC in 6 months or sooner if needed

## 2013-07-10 NOTE — Assessment & Plan Note (Signed)
Not anticoagulated Follows with Dr. Mariah Milling

## 2013-07-17 ENCOUNTER — Other Ambulatory Visit: Payer: Self-pay | Admitting: Family Medicine

## 2013-07-18 ENCOUNTER — Ambulatory Visit (INDEPENDENT_AMBULATORY_CARE_PROVIDER_SITE_OTHER): Payer: Medicare Other

## 2013-07-18 ENCOUNTER — Telehealth: Payer: Self-pay

## 2013-07-18 ENCOUNTER — Other Ambulatory Visit: Payer: Self-pay

## 2013-07-18 DIAGNOSIS — E785 Hyperlipidemia, unspecified: Secondary | ICD-10-CM

## 2013-07-18 DIAGNOSIS — Z23 Encounter for immunization: Secondary | ICD-10-CM

## 2013-07-18 DIAGNOSIS — I4891 Unspecified atrial fibrillation: Secondary | ICD-10-CM

## 2013-07-18 DIAGNOSIS — I10 Essential (primary) hypertension: Secondary | ICD-10-CM

## 2013-07-18 MED ORDER — AMIODARONE HCL 200 MG PO TABS: 200.0000 mg | ORAL_TABLET | Freq: Every day | ORAL | Status: DC

## 2013-07-18 MED ORDER — METOPROLOL SUCCINATE ER 50 MG PO TB24: ORAL_TABLET | ORAL | Status: DC

## 2013-07-18 NOTE — Telephone Encounter (Signed)
Left message for pt to call back  °

## 2013-07-18 NOTE — Telephone Encounter (Signed)
Spoke w/ pt.  Reviewed meds with her and she states that she has no refills on her amiodarone and metoprolol.   Sent refills in for her. She will call if she has any other questions or concerns.

## 2013-07-18 NOTE — Telephone Encounter (Signed)
Patient has not taken her heart medications for two weeks. She is unsure of what medications she is to be taken for her heart. She came in today for a Liv/Lip and has taken the atorvastatin. Please call the patient because she is out of all her heart medications and was told has no refills left. I told her what the last office visit stated but she thinks Dr. Mariah Milling took her off a lot of heart medications and she needs some reassurance on what she is to be taking. Please advise.

## 2013-07-19 ENCOUNTER — Other Ambulatory Visit: Payer: Self-pay

## 2013-07-19 LAB — LIPID PANEL
Chol/HDL Ratio: 5 ratio units — ABNORMAL HIGH (ref 0.0–4.4)
Triglycerides: 156 mg/dL — ABNORMAL HIGH (ref 0–149)

## 2013-07-19 LAB — HEPATIC FUNCTION PANEL
ALT: 21 IU/L (ref 0–32)
AST: 30 IU/L (ref 0–40)
Albumin: 4.4 g/dL (ref 3.5–4.8)
Alkaline Phosphatase: 58 IU/L (ref 39–117)
Total Bilirubin: 0.6 mg/dL (ref 0.0–1.2)

## 2013-07-19 MED ORDER — ZOLPIDEM TARTRATE 5 MG PO TABS: 5.0000 mg | ORAL_TABLET | Freq: Every evening | ORAL | Status: DC | PRN

## 2013-07-19 NOTE — Telephone Encounter (Signed)
Called to [facility] [address] [place]. 

## 2013-07-19 NOTE — Telephone Encounter (Signed)
Pt left note requesting refill ambien to [facility] [address]Please advise.

## 2013-07-19 NOTE — Telephone Encounter (Signed)
Okay #30 x 0 

## 2013-07-24 ENCOUNTER — Encounter: Payer: Self-pay | Admitting: Cardiovascular Disease

## 2013-07-24 ENCOUNTER — Telehealth: Payer: Self-pay | Admitting: *Deleted

## 2013-07-24 NOTE — Telephone Encounter (Signed)
Pt called and left voicemail requesting a rx be called in for a cold that pt has had for 3 weeks.  I called pt and left a message on her cell phone voicemail that she would need to call back to schedule and appt to be evaluated.

## 2013-07-25 ENCOUNTER — Encounter: Payer: Self-pay | Admitting: Internal Medicine

## 2013-07-25 ENCOUNTER — Ambulatory Visit (INDEPENDENT_AMBULATORY_CARE_PROVIDER_SITE_OTHER): Payer: Medicare Other | Admitting: Internal Medicine

## 2013-07-25 ENCOUNTER — Telehealth: Payer: Self-pay

## 2013-07-25 VITALS — BP 120/80 | HR 58 | Temp 98.2°F

## 2013-07-25 DIAGNOSIS — J209 Acute bronchitis, unspecified: Secondary | ICD-10-CM

## 2013-07-25 MED ORDER — DOXYCYCLINE HYCLATE 100 MG PO TABS: 100.0000 mg | ORAL_TABLET | Freq: Two times a day (BID) | ORAL | Status: DC

## 2013-07-25 MED ORDER — AZITHROMYCIN 250 MG PO TABS: ORAL_TABLET | ORAL | Status: DC

## 2013-07-25 NOTE — Telephone Encounter (Signed)
Change to doxycycline 100 mg BID x 10 days

## 2013-07-25 NOTE — Telephone Encounter (Signed)
Arlys John pharmacist at [facility] [facility] left v/m; pt is taking amiodarone and Arlys John said combination of Amiodarone and Z pack can cause prolonged QT. Arlys John request cb to fill Z pack or change antibiotic to Doxycycline that may not cause prolonged QT with Amiodarone.Please advise.

## 2013-07-25 NOTE — Progress Notes (Signed)
Pre-visit discussion using our clinic review tool. No additional management support is needed unless otherwise documented below in the visit note.  

## 2013-07-25 NOTE — Progress Notes (Signed)
HPI  Pt presents to the clinic today with c/o chest congestion with thick green mucous production. She has had some fatigue. She denies fever, but has had chills and body aches. She has not tried anything OTC because she is not sure what interacts with her medicines. She has had sick contacts. She has a history of seasonal allergies and OSA.  Review of Systems      Past Medical History  Diagnosis Date  . Atrial fibrillation     paroxysmal  . Hypertension   . Hypercholesterolemia   . Mitral insufficiency     Mild mitral insufficiency  . Dyspnea   . Seasonal allergies   . Sleep apnea   . RBBB (right bundle branch block)   . History of chicken pox   . Arrhythmia     Family History  Problem Relation Age of Onset  . Stroke Mother   . Heart attack Mother   . Arthritis Mother   . Hyperlipidemia Mother   . Heart disease Mother   . Diabetes Mother   . Diabetes Sister   . Cancer Daughter   . Heart disease Daughter     History   Social History  . Marital Status: Widowed    Spouse Name: N/A    Number of Children: 2  . Years of Education: N/A   Occupational History  . REALTOR    Social History Main Topics  . Smoking status: Never Smoker   . Smokeless tobacco: Not on file  . Alcohol Use: No  . Drug Use: No  . Sexual Activity: No   Other Topics Concern  . Not on file   Social History Narrative  . No narrative on file    Allergies  Allergen Reactions  . Codeine      Constitutional: Positive headache, fatigue. Denies fever or abrupt weight changes.  HEENT:  Positive sore throat. Denies eye redness, eye pain, pressure behind the eyes, facial pain, nasal congestion, ear pain, ringing in the ears, wax buildup, runny nose or bloody nose. Respiratory: Positive cough. Denies difficulty breathing or shortness of breath.  Cardiovascular: Denies chest pain, chest tightness, palpitations or swelling in the hands or feet.   No other specific complaints in a complete  review of systems (except as listed in HPI above).  Objective:   BP 120/80  Pulse 58  Temp(Src) 98.2 F (36.8 C) (Tympanic)  SpO2 98% Wt Readings from Last 3 Encounters:  07/10/13 176 lb 4 oz (79.946 kg)  05/26/13 173 lb 4 oz (78.586 kg)  07/22/11 191 lb (86.637 kg)     General: Appears her stated age, well developed, well nourished in NAD. HEENT: Head: normal shape and size; Eyes: sclera white, no icterus, conjunctiva pink, PERRLA and EOMs intact; Ears: Tm's gray and intact, normal light reflex; Nose: mucosa pink and moist, septum midline; Throat/Mouth: + PND. Teeth present, mucosa erythematous and moist, no exudate noted, no lesions or ulcerations noted.  Neck: Mild cervical lymphadenopathy. Neck supple, trachea midline. No massses, lumps or thyromegaly present.  Cardiovascular: Normal rate and rhythm. S1,S2 noted.  No murmur, rubs or gallops noted. No JVD or BLE edema. No carotid bruits noted. Pulmonary/Chest: Normal effort and positive vesicular breath sounds. No respiratory distress. No wheezes, rales or ronchi noted.      Assessment & Plan:   Acute Bronchitis  Get some rest and drink plenty of water Do salt water gargles for the sore throat eRx for Azithromax x 5 days Delsym OTC Do not  use your CPAP for the next 4 nights  RTC as needed or if symptoms persist.

## 2013-07-25 NOTE — Telephone Encounter (Signed)
Pt called about status of med change. Spoke with [name] [facility] and called in doxycycline 100 mg twice a day for 10 days. #20 x 0. Cancelled Zpack prescription. Pt will go by pharmacy to pick up doxycycline.

## 2013-07-25 NOTE — Patient Instructions (Signed)
Acute Bronchitis Bronchitis is inflammation of the airways that extend from the windpipe into the lungs (bronchi). The inflammation often causes mucus to develop. This leads to a cough, which is the most common symptom of bronchitis.  In acute bronchitis, the condition usually develops suddenly and goes away over time, usually in a couple weeks. Smoking, allergies, and asthma can make bronchitis worse. Repeated episodes of bronchitis may cause further lung problems.  CAUSES Acute bronchitis is most often caused by the same virus that causes a cold. The virus can spread from person to person (contagious).  SIGNS AND SYMPTOMS   Cough.   Fever.   Coughing up mucus.   Body aches.   Chest congestion.   Chills.   Shortness of breath.   Sore throat.  DIAGNOSIS  Acute bronchitis is usually diagnosed through a physical exam. Tests, such as chest X-rays, are sometimes done to rule out other conditions.  TREATMENT  Acute bronchitis usually goes away in a couple weeks. Often times, no medical treatment is necessary. Medicines are sometimes given for relief of fever or cough. Antibiotics are usually not needed but may be prescribed in certain situations. In some cases, an inhaler may be recommended to help reduce shortness of breath and control the cough. A cool mist vaporizer may also be used to help thin bronchial secretions and make it easier to clear the chest.  HOME CARE INSTRUCTIONS  Get plenty of rest.   Drink enough fluids to keep your urine clear or pale yellow (unless you have a medical condition that requires fluid restriction). Increasing fluids may help thin your secretions and will prevent dehydration.   Only take over-the-counter or prescription medicines as directed by your health care provider.   Avoid smoking and secondhand smoke. Exposure to cigarette smoke or irritating chemicals will make bronchitis worse. If you are a smoker, consider using nicotine gum or skin  patches to help control withdrawal symptoms. Quitting smoking will help your lungs heal faster.   Reduce the chances of another bout of acute bronchitis by washing your hands frequently, avoiding people with cold symptoms, and trying not to touch your hands to your mouth, nose, or eyes.   Follow up with your health care provider as directed.  SEEK MEDICAL CARE IF: Your symptoms do not improve after 1 week of treatment.  SEEK IMMEDIATE MEDICAL CARE IF:  You develop an increased fever or chills.   You have chest pain.   You have severe shortness of breath.  You have bloody sputum.   You develop dehydration.  You develop fainting.  You develop repeated vomiting.  You develop a severe headache. MAKE SURE YOU:   Understand these instructions.  Will watch your condition.  Will get help right away if you are not doing well or get worse. Document Released: 09/10/2004 Document Revised: 04/05/2013 Document Reviewed: 01/24/2013 ExitCare Patient Information 2014 [employer].  

## 2013-08-08 ENCOUNTER — Other Ambulatory Visit: Payer: Self-pay | Admitting: Cardiovascular Disease

## 2013-08-08 DIAGNOSIS — E785 Hyperlipidemia, unspecified: Secondary | ICD-10-CM

## 2013-08-08 MED ORDER — ATORVASTATIN CALCIUM 10 MG PO TABS: 10.0000 mg | ORAL_TABLET | Freq: Every day | ORAL | Status: DC

## 2013-08-15 ENCOUNTER — Telehealth: Payer: Self-pay | Admitting: *Deleted

## 2013-08-15 ENCOUNTER — Other Ambulatory Visit: Payer: Self-pay | Admitting: Internal Medicine

## 2013-08-15 DIAGNOSIS — E7800 Pure hypercholesterolemia, unspecified: Secondary | ICD-10-CM

## 2013-08-15 NOTE — Telephone Encounter (Signed)
Patient stated that [facility] had called with her lab results and started her on atorvastatin per Dr. Mariah Milling. She is confused because she said she had already been taking atorvastatin 10 mg daily. Tricor was also on her med list. She said she does not think she has ever taken tricor.

## 2013-08-15 NOTE — Telephone Encounter (Signed)
Last filled 09/08/12

## 2013-08-15 NOTE — Telephone Encounter (Signed)
Ok to phone in ambien 

## 2013-08-15 NOTE — Telephone Encounter (Signed)
Patient called and is confused on the Lipitor. Not sure what the directions are please advise. Thanks

## 2013-08-15 NOTE — Telephone Encounter (Signed)
rx called into pharmacy

## 2013-08-17 NOTE — Telephone Encounter (Signed)
We did not have atorvastatin on her med lis on last visit. If she is already taking lipitor/atorvastatin 10 daily and total cholesterol 248, then need 40 mg daily with recheck in 3 months   If not taking daily, then start lipitor 10 daily. Could hold tricor for now if she likes, focus on atorvastatin/lipitor to start

## 2013-08-18 ENCOUNTER — Other Ambulatory Visit: Payer: Self-pay

## 2013-08-18 DIAGNOSIS — E7800 Pure hypercholesterolemia, unspecified: Secondary | ICD-10-CM

## 2013-08-18 MED ORDER — ATORVASTATIN CALCIUM 40 MG PO TABS
40.0000 mg | ORAL_TABLET | Freq: Every day | ORAL | Status: DC
Start: 2013-08-18 — End: 2013-11-27

## 2013-08-18 NOTE — Telephone Encounter (Signed)
Spoke w/ pt.  She reports that she has been taking lipitor 10mg  daily, as well as tricor.  She is agreeable to holding and tricor and starting lipitor 40 mg. She will have labs rechecking in 3 months and call the office w/ any questions or concerns.

## 2013-09-04 ENCOUNTER — Ambulatory Visit: Payer: Self-pay | Admitting: Internal Medicine

## 2013-09-13 ENCOUNTER — Other Ambulatory Visit: Payer: Self-pay

## 2013-09-13 MED ORDER — ALPRAZOLAM 0.25 MG PO TABS: 0.2500 mg | ORAL_TABLET | Freq: Every evening | ORAL | Status: DC | PRN

## 2013-09-13 MED ORDER — ZOLPIDEM TARTRATE 5 MG PO TABS: ORAL_TABLET | ORAL | Status: DC

## 2013-09-13 NOTE — Telephone Encounter (Signed)
Please phone in [place] and xanax

## 2013-09-13 NOTE — Telephone Encounter (Signed)
Received request for Xanax-in historical, never prescribed by you, and Ambien last filled 08/15/13--please advise

## 2013-09-13 NOTE — Telephone Encounter (Signed)
Rx called in to pharmacy. 

## 2013-10-13 ENCOUNTER — Telehealth: Payer: Self-pay

## 2013-10-13 NOTE — Telephone Encounter (Signed)
Pt said needs new order for cpap supplies;  Pt needs order for chin strap to keep pts mouth closed while using cpap. Pt request order faxed [place] Patient phone # [phone]. Pt said was seen recently and does not want to schedule appt for durable med equipment.Please advise. Pt request cb.

## 2013-10-13 NOTE — Telephone Encounter (Signed)
Who originally ordered her CPAP, primary care specialist normally do not order these supplies

## 2013-10-13 NOTE — Telephone Encounter (Signed)
Pt states that she usually orders for herself but she states this time they told her she needed an order for chin strap... Anything else pt states her previous PCP would order--I will call to find out what is needed

## 2013-10-28 ENCOUNTER — Other Ambulatory Visit: Payer: Self-pay | Admitting: Internal Medicine

## 2013-10-30 NOTE — Telephone Encounter (Signed)
LAST FILLED 09/13/13--PLEASE ADVISE

## 2013-10-30 NOTE — Telephone Encounter (Signed)
Ok to phone in ambien and xanax 

## 2013-10-30 NOTE — Telephone Encounter (Signed)
Rx called in to pharmacy. 

## 2013-11-22 ENCOUNTER — Telehealth: Payer: Self-pay

## 2013-11-22 ENCOUNTER — Ambulatory Visit (INDEPENDENT_AMBULATORY_CARE_PROVIDER_SITE_OTHER): Payer: Medicare PPO | Admitting: *Deleted

## 2013-11-22 DIAGNOSIS — E7800 Pure hypercholesterolemia, unspecified: Secondary | ICD-10-CM

## 2013-11-22 NOTE — Telephone Encounter (Signed)
Notes indicate she was started on lipitor 10 mg daily

## 2013-11-22 NOTE — Telephone Encounter (Signed)
Pt came in for lab draw for cholesterol check today.  She states that since starting lipitor 40mg , she is experiencing severe leg pain. She also states that she has been feeling very tired.   She has contributed these sx to her job, but is not wondering if they are r/t lipitor or if she needs further cardiac testing.  Please advise. Thank you.

## 2013-11-23 ENCOUNTER — Other Ambulatory Visit: Payer: Medicare Other

## 2013-11-23 LAB — LIPID PANEL
CHOLESTEROL TOTAL: 242 mg/dL — AB (ref 100–199)
Chol/HDL Ratio: 4.7 ratio units — ABNORMAL HIGH (ref 0.0–4.4)
HDL: 51 mg/dL (ref >39)
LDL Calculated: 165 mg/dL — ABNORMAL HIGH (ref 0–99)
Triglycerides: 131 mg/dL (ref 0–149)
VLDL Cholesterol Cal: 26 mg/dL (ref 5–40)

## 2013-11-23 NOTE — Telephone Encounter (Signed)
We increased her Lipitor to 40mg  daily on 08/18/13.  Do you want her to try the 10mg ?   "Stana Bunting, RN at 08/18/2013 8:52 AM   Status: Signed       Spoke w/ pt. She reports that she has been taking lipitor 10mg  daily, as well as tricor.  She is agreeable to holding and tricor and starting lipitor 40 mg.  She will have labs rechecking in 3 months and call the office w/ any questions or concerns.        Minna Merritts, MD at 08/17/2013 8:39 PM    Status: Signed        We did not have atorvastatin on her med lis on last visit.  If she is already taking lipitor/atorvastatin 10 daily and total cholesterol 248, then need 40 mg daily with recheck in 3 months"

## 2013-11-24 NOTE — Telephone Encounter (Signed)
Would go back to 10 mg daily

## 2013-11-27 ENCOUNTER — Other Ambulatory Visit: Payer: Self-pay

## 2013-11-27 DIAGNOSIS — E785 Hyperlipidemia, unspecified: Secondary | ICD-10-CM

## 2013-11-27 MED ORDER — LOVASTATIN 20 MG PO TABS: 20.0000 mg | ORAL_TABLET | Freq: Every day | ORAL | Status: DC

## 2013-11-27 NOTE — Telephone Encounter (Signed)
Left detailed message on pt's vm of Dr. Donivan Scull recommendation. Asked pt to call back w/ any questions or concerns.

## 2013-11-27 NOTE — Telephone Encounter (Signed)
Would try lovastatin 20 mg daily We'll need to increase the dose in several months as this is a weaker medicine Check cholesterol level in 3 months time

## 2013-11-27 NOTE — Telephone Encounter (Signed)
Spoke w/ pt.  Advised her to reduce Lipitor from 40mg  down to 10mg .  Pt states that she does not feel comfortable taking Lipitor at all, as she feels that it is "crippling" her. She would like to be changed to a different type of medication.  Pt reports that she has not taken any Lipitor since her last ov. Please advise.  Thank you.

## 2013-12-29 ENCOUNTER — Encounter: Payer: Self-pay | Admitting: Internal Medicine

## 2013-12-29 ENCOUNTER — Ambulatory Visit (INDEPENDENT_AMBULATORY_CARE_PROVIDER_SITE_OTHER): Payer: Medicare PPO | Admitting: Internal Medicine

## 2013-12-29 VITALS — BP 120/62 | HR 64 | Temp 98.5°F | Wt 176.5 lb

## 2013-12-29 DIAGNOSIS — J019 Acute sinusitis, unspecified: Secondary | ICD-10-CM

## 2013-12-29 MED ORDER — CEFUROXIME AXETIL 500 MG PO TABS
500.0000 mg | ORAL_TABLET | Freq: Two times a day (BID) | ORAL | Status: DC
Start: 2013-12-29 — End: 2014-01-02

## 2013-12-29 NOTE — Progress Notes (Signed)
HPI  Pt presents to the clinic today with c/o nasal congestion, facial pain and pressure, headache and fatigue. She reports this started 1 month ago. She is blowing thick yellow mucous out of her nose. She denies fever, but has had chills and body aches. She has tried OTC sinus medication, flonase and singulair without relief. She does have a history of seasonal allergies.  Review of Systems    Past Medical History  Diagnosis Date  . Atrial fibrillation     paroxysmal  . Hypertension   . Hypercholesterolemia   . Mitral insufficiency     Mild mitral insufficiency  . Dyspnea   . Seasonal allergies   . Sleep apnea   . RBBB (right bundle branch block)   . History of chicken pox   . Arrhythmia     Family History  Problem Relation Age of Onset  . Stroke Mother   . Heart attack Mother   . Arthritis Mother   . Hyperlipidemia Mother   . Heart disease Mother   . Diabetes Mother   . Diabetes Sister   . Cancer Daughter   . Heart disease Daughter     History   Social History  . Marital Status: Widowed    Spouse Name: N/A    Number of Children: 2  . Years of Education: N/A   Occupational History  . REALTOR    Social History Main Topics  . Smoking status: Never Smoker   . Smokeless tobacco: Not on file  . Alcohol Use: No  . Drug Use: No  . Sexual Activity: No   Other Topics Concern  . Not on file   Social History Narrative  . No narrative on file    Allergies  Allergen Reactions  . Codeine      Constitutional: Positive headache, fatigue. Denies fever or abrupt weight changes.  HEENT:  Positive eye pain, pressure behind the eyes, facial pain, nasal congestion and sore throat. Denies eye redness, ear pain, ringing in the ears, wax buildup, runny nose or bloody nose. Respiratory: Positive cough. Denies difficulty breathing or shortness of breath.  Cardiovascular: Denies chest pain, chest tightness, palpitations or swelling in the hands or feet.   No other  specific complaints in a complete review of systems (except as listed in HPI above).  Objective:    General: Appears his stated age, well developed, well nourished in NAD. HEENT: Head: normal shape and size, maxillary sinus tenderness noted; Eyes: sclera white, no icterus, conjunctiva pink, PERRLA and EOMs intact; Ears: Tm's gray and intact, normal light reflex, + effusion bilaterally; Nose: mucosa boggy and moist, septum midline; Throat/Mouth: + PND. Teeth present, mucosa pink and moist, no exudate noted, no lesions or ulcerations noted.  Neck: Neck supple, trachea midline. No massses, lumps or thyromegaly present.  Cardiovascular: Normal rate and rhythm. S1,S2 noted.  No murmur, rubs or gallops noted. No JVD or BLE edema. No carotid bruits noted. Pulmonary/Chest: Normal effort and positive vesicular breath sounds. No respiratory distress. No wheezes, rales or ronchi noted.      Assessment & Plan:   Acute sinusitis  Can use a Neti Pot which can be purchased from your local drug store. eRx for Ceftin BID x 10 days (she reports she cannot take Augmentin d/t GI upset) Ibuprofen for pain/fever Continue singulair, claritin and flonase  RTC as needed or if symptoms persist.

## 2013-12-29 NOTE — Patient Instructions (Addendum)

## 2013-12-29 NOTE — Progress Notes (Signed)
Pre visit review using our clinic review tool, if applicable. No additional management support is needed unless otherwise documented below in the visit note. 

## 2014-01-02 ENCOUNTER — Ambulatory Visit (INDEPENDENT_AMBULATORY_CARE_PROVIDER_SITE_OTHER): Payer: Medicare PPO | Admitting: Internal Medicine

## 2014-01-02 ENCOUNTER — Encounter: Payer: Self-pay | Admitting: Internal Medicine

## 2014-01-02 VITALS — BP 118/66 | HR 66 | Temp 97.5°F | Wt 177.5 lb

## 2014-01-02 DIAGNOSIS — Z23 Encounter for immunization: Secondary | ICD-10-CM

## 2014-01-02 DIAGNOSIS — S51809A Unspecified open wound of unspecified forearm, initial encounter: Secondary | ICD-10-CM

## 2014-01-02 DIAGNOSIS — W540XXA Bitten by dog, initial encounter: Secondary | ICD-10-CM

## 2014-01-02 DIAGNOSIS — S51859A Open bite of unspecified forearm, initial encounter: Secondary | ICD-10-CM

## 2014-01-02 MED ORDER — METRONIDAZOLE 500 MG PO TABS: 500.0000 mg | ORAL_TABLET | Freq: Three times a day (TID) | ORAL | Status: DC

## 2014-01-02 MED ORDER — CEFTRIAXONE SODIUM 1 G IJ SOLR
1.0000 g | Freq: Once | INTRAMUSCULAR | Status: AC
  Administered 2014-01-02: 1 g via INTRAMUSCULAR

## 2014-01-02 MED ORDER — CEFUROXIME AXETIL 500 MG PO TABS
500.0000 mg | ORAL_TABLET | Freq: Two times a day (BID) | ORAL | Status: DC
Start: 2014-01-02 — End: 2014-03-07

## 2014-01-02 NOTE — Addendum Note (Signed)
Addended by: Lurlean Nanny on: 01/02/2014 02:31 PM   Modules accepted: Orders

## 2014-01-02 NOTE — Patient Instructions (Addendum)

## 2014-01-02 NOTE — Progress Notes (Signed)
Pre visit review using our clinic review tool, if applicable. No additional management support is needed unless otherwise documented below in the visit note. 

## 2014-01-02 NOTE — Progress Notes (Signed)
Subjective:    Patient ID: Jill Snow, female    DOB: 08-20-1933, 80 y.o.   MRN: [id number]  HPI  Pt presents to the clinic today for a dog bite. This occurred 3 days ago. She reports 4 puncture wounds on her right forearm. She has noticed some redness, warmth and tenderness around the puncture sites. She denies fevers. Her tetanus injection is overdue. The dog is up to date on all of his shots.  Review of Systems  Past Medical History  Diagnosis Date  . Atrial fibrillation     paroxysmal  . Hypertension   . Hypercholesterolemia   . Mitral insufficiency     Mild mitral insufficiency  . Dyspnea   . Seasonal allergies   . Sleep apnea   . RBBB (right bundle branch block)   . History of chicken pox   . Arrhythmia     Current Outpatient Prescriptions  Medication Sig Dispense Refill  . ALPRAZolam (XANAX) 0.25 MG tablet TAKE ONE TABLET AT BEDTIME AS NEEDED  30 tablet  0  . amiodarone (PACERONE) 200 MG tablet Take 1 tablet (200 mg total) by mouth daily.  30 tablet  6  . cefUROXime (CEFTIN) 500 MG tablet Take 1 tablet (500 mg total) by mouth 2 (two) times daily with a meal.  20 tablet  0  . fluticasone (FLONASE) 50 MCG/ACT nasal spray Place 2 sprays into the nose daily.      . furosemide (LASIX) 20 MG tablet Take 20 mg by mouth as directed. [place], [place]. And Fri.      . lovastatin (MEVACOR) 20 MG tablet Take 1 tablet (20 mg total) by mouth at bedtime.  30 tablet  6  . metoprolol succinate (TOPROL-XL) 50 MG 24 hr tablet TAKE 1/2 TABLET BY MOUTH DAILY FOR BLOOD PRESSURE  30 tablet  5  . montelukast (SINGULAIR) 10 MG tablet Take 10 mg by mouth at bedtime.      Marland Kitchen zolpidem (AMBIEN) 5 MG tablet TAKE ONE TABLET AT BEDTIME AS NEEDED  30 tablet  0   No current facility-administered medications for this visit.    Allergies  Allergen Reactions  . Codeine     Family History  Problem Relation Age of Onset  . Stroke Mother   . Heart attack Mother   . Arthritis Mother   .  Hyperlipidemia Mother   . Heart disease Mother   . Diabetes Mother   . Diabetes Sister   . Cancer Daughter   . Heart disease Daughter     History   Social History  . Marital Status: Widowed    Spouse Name: N/A    Number of Children: 2  . Years of Education: N/A   Occupational History  . REALTOR    Social History Main Topics  . Smoking status: Never Smoker   . Smokeless tobacco: Not on file  . Alcohol Use: No  . Drug Use: No  . Sexual Activity: No   Other Topics Concern  . Not on file   Social History Narrative  . No narrative on file     Constitutional: Denies fever, malaise, fatigue, headache or abrupt weight changes.  Skin: Pt reports dog bite.    No other specific complaints in a complete review of systems (except as listed in HPI above).     Objective:   Physical Exam  BP 118/66  Pulse 66  Temp(Src) 97.5 F (36.4 C) (Oral)  Wt 177 lb 8 oz (80.513 kg)  SpO2 98% Wt Readings from Last 3 Encounters:  01/02/14 177 lb 8 oz (80.513 kg)  12/29/13 176 lb 8 oz (80.06 kg)  07/10/13 176 lb 4 oz (79.946 kg)    General: Appears her stated age, well developed, well nourished in NAD. Skin: 4 linear puncture wounds noted on the right forearm, surrounded by area of redness. Area scabbed over. Some streaking up the arm noted. Cardiovascular: Normal rate and rhythm. S1,S2 noted.  No murmur, rubs or gallops noted. No JVD or BLE edema. No carotid bruits noted. Pulmonary/Chest: Normal effort and positive vesicular breath sounds. No respiratory distress. No wheezes, rales or ronchi noted.    BMET    Component Value Date/Time   NA 137 03/06/2010 1411   K 3.8 03/06/2010 1411   CL 108 03/06/2010 1411   CO2 21 03/06/2010 1411   GLUCOSE 135* 03/06/2010 1411   BUN 8 03/06/2010 1411   CREATININE 0.64 03/06/2010 1411   CALCIUM 8.9 03/06/2010 1411   GFRNONAA >60 03/06/2010 1411   GFRAA  Value: >60        The eGFR has been calculated using the MDRD equation. This calculation has  not been validated in all clinical situations. eGFR's persistently <60 mL/min signify possible Chronic Kidney Disease. 03/06/2010 1411    Lipid Panel     Component Value Date/Time   CHOL  Value: 148        ATP III CLASSIFICATION:  <200     mg/dL   Desirable  200-239  mg/dL   Borderline High  >=240    mg/dL   High        03/07/2010 0440   TRIG 131 11/22/2013 0832   HDL 51 11/22/2013 0832   HDL 39* 03/07/2010 0440   CHOLHDL 4.7* 11/22/2013 0832   CHOLHDL 3.8 03/07/2010 0440   VLDL 38 03/07/2010 0440   LDLCALC 165* 11/22/2013 0832   LDLCALC  Value: 71        Total Cholesterol/HDL:CHD Risk Coronary Heart Disease Risk Table                     Men   Women  1/2 Average Risk   3.4   3.3  Average Risk       5.0   4.4  2 X Average Risk   9.6   7.1  3 X Average Risk  23.4   11.0        Use the calculated Patient Ratio above and the CHD Risk Table to determine the patient's CHD Risk.        ATP III CLASSIFICATION (LDL):  <100     mg/dL   Optimal  100-129  mg/dL   Near or Above                    Optimal  130-159  mg/dL   Borderline  160-189  mg/dL   High  >190     mg/dL   Very High 03/07/2010 0440    CBC    Component Value Date/Time   WBC 6.1 03/06/2010 1710   RBC 4.52 03/06/2010 1710   HGB 14.6 03/06/2010 1710   HCT 42.5 03/06/2010 1710   PLT 187 03/06/2010 1710   MCV 93.9 03/06/2010 1710   MCH 32.4 03/06/2010 1710   MCHC 34.5 03/06/2010 1710   RDW 12.4 03/06/2010 1710    Hgb A1C No results found for this basename: HGBA1C         Assessment &  Plan:   Dog bite:  She is already on Ceftin for a sinus infection- Will extend this course for 7 more days eRx for flagyl TID x 10 days 1 gm Rocephin IM today Tdap given today Instructed her how to clean the wound with warm water and soap  Watch for sign of infection, redness, streaking, warmth, purulent drainage, fever  RTC as needed or if any of the signs discussed above develop.

## 2014-01-05 ENCOUNTER — Other Ambulatory Visit: Payer: Self-pay | Admitting: Internal Medicine

## 2014-01-05 NOTE — Telephone Encounter (Signed)
Last called in 10/30/13--please advise

## 2014-01-09 NOTE — Telephone Encounter (Signed)
Rx called in to pharmacy. 

## 2014-01-09 NOTE — Telephone Encounter (Signed)
Ok to phone in xanax 

## 2014-01-23 ENCOUNTER — Other Ambulatory Visit: Payer: Self-pay | Admitting: Internal Medicine

## 2014-01-23 NOTE — Telephone Encounter (Signed)
Please phone in ambien

## 2014-01-23 NOTE — Telephone Encounter (Signed)
Last filled 10/30/13--please advise

## 2014-01-24 NOTE — Telephone Encounter (Signed)
Rx called in to pharmacy. 

## 2014-01-24 NOTE — Telephone Encounter (Signed)
This was never prescribed by you--just wanted the okay to send in Rx for pt--recently had an acute OV with you on 12/29/13 and 01/02/14--please advise

## 2014-02-28 ENCOUNTER — Telehealth: Payer: Self-pay | Admitting: Cardiovascular Disease

## 2014-02-28 NOTE — Telephone Encounter (Signed)
Pt isnt feeling well, doesn't want to say shes sick but would like to do some blood work and or come see [name], please advise

## 2014-02-28 NOTE — Telephone Encounter (Signed)
Pt has not been seen since 05/26/13.  She needs an appt to be seen.  Thank you.

## 2014-03-06 ENCOUNTER — Other Ambulatory Visit: Payer: Self-pay | Admitting: Internal Medicine

## 2014-03-06 NOTE — Telephone Encounter (Signed)
Ok to phone in ambien 

## 2014-03-06 NOTE — Telephone Encounter (Signed)
last filled 01/23/14--please advise

## 2014-03-06 NOTE — Telephone Encounter (Signed)
Rx called in to pharmacy. 

## 2014-03-07 ENCOUNTER — Encounter: Payer: Self-pay | Admitting: Cardiovascular Disease

## 2014-03-07 ENCOUNTER — Ambulatory Visit (INDEPENDENT_AMBULATORY_CARE_PROVIDER_SITE_OTHER): Payer: Medicare PPO | Admitting: Cardiovascular Disease

## 2014-03-07 VITALS — BP 138/82 | HR 65 | Ht 66.5 in | Wt 177.2 lb

## 2014-03-07 DIAGNOSIS — R0602 Shortness of breath: Secondary | ICD-10-CM

## 2014-03-07 DIAGNOSIS — G4733 Obstructive sleep apnea (adult) (pediatric): Secondary | ICD-10-CM | POA: Insufficient documentation

## 2014-03-07 DIAGNOSIS — I480 Paroxysmal atrial fibrillation: Secondary | ICD-10-CM

## 2014-03-07 DIAGNOSIS — M810 Age-related osteoporosis without current pathological fracture: Secondary | ICD-10-CM

## 2014-03-07 DIAGNOSIS — I10 Essential (primary) hypertension: Secondary | ICD-10-CM

## 2014-03-07 DIAGNOSIS — E7800 Pure hypercholesterolemia, unspecified: Secondary | ICD-10-CM

## 2014-03-07 DIAGNOSIS — R5381 Other malaise: Secondary | ICD-10-CM

## 2014-03-07 DIAGNOSIS — I4891 Unspecified atrial fibrillation: Secondary | ICD-10-CM

## 2014-03-07 DIAGNOSIS — R5382 Chronic fatigue, unspecified: Secondary | ICD-10-CM

## 2014-03-07 DIAGNOSIS — E785 Hyperlipidemia, unspecified: Secondary | ICD-10-CM

## 2014-03-07 DIAGNOSIS — Z79899 Other long term (current) drug therapy: Secondary | ICD-10-CM

## 2014-03-07 DIAGNOSIS — Z9989 Dependence on other enabling machines and devices: Secondary | ICD-10-CM

## 2014-03-07 DIAGNOSIS — R5383 Other fatigue: Secondary | ICD-10-CM

## 2014-03-07 NOTE — Assessment & Plan Note (Signed)
Suggested that she stay on her lovastatin. Lipid panel ordered and drawn today in clinic, among other labs.

## 2014-03-07 NOTE — Patient Instructions (Addendum)
You are doing well. No medication changes were made.  We will make a referral to pulmonary in [place] for sleep apnea, follow up for CPAP Dr. Elsworth Soho, Aug 27 @ 9:30 Please call [phone] if you are unable to keep this appt  Go to the app store: Search for pulse meter Download: instant heart rate , cardiograph  Please call us if you have new issues that need to be addressed before your next appt.  Your physician wants you to follow-up in: 12 months.  You will receive a reminder letter in the mail two months in advance. If you don't receive a letter, please call our office to schedule the follow-up appointment.

## 2014-03-07 NOTE — Assessment & Plan Note (Signed)
She denies having any further episodes of atrial fibrillation. No medication changes made

## 2014-03-07 NOTE — Progress Notes (Signed)
Patient ID: Jill Snow, female    DOB: [birth year], 80 y.o.   MRN: [id number]  HPI Comments: Jill Snow has a history of paroxysmal atrial fibrillation, episode in July 2011 , also history of hypertension and hypercholesterolemia, diagnosed with with sleep apnea, uses nasal pillow CPAP and oxygen therapy at night.  In followup today, she reports that she is feeling well. She denies having any arrhythmia. She works out several days per week, continues to work in Personal assistant. She is uncertain if she needs her CPAP. States that she might need a change on her settings or tubing. She does not have anyone that can help her. She has tried to sleep without her CPAP and reports that she sleeps well. Reports also having oxygen which again she does not think that she needs. No prior history of smoking  Recent hospitalization 05/14/2013 for palpitations, shortness of breath with tachycardia. She had upper respiratory infection and was found to be in atrial flutter. She was given IV Cardizem with minimal improvement of her heart rate. She was hypotensive and required several days in the hospital for management. She ended up needing TEE and cardioversion as rate was difficult to control despite metoprolol, aspirin channel blocker and digoxin. She was started on anticoagulation at that time.   She presents in followup today and reports that she is feeling better. She was initially week and now is back to work, feeling more like herself She has not been measuring her heart rate or blood pressure at home She did have some confusion while in the hospital. This has resolved at home.  TEE dated 05/16/2013 showed ejection fraction 45-50%, mildly dilated left and right atrium, moderate mitral valve regurgitation noted Chest x-ray in the hospital did show small bilateral pleural effusions  EKG today shows sinus bradycardia, right bundle branch block, rate 65 beats per minute, nonspecific ST and T wave  abnormality in lead 3, aVF   Outpatient Encounter Prescriptions as of 03/07/2014  Medication Sig  . ALPRAZolam (XANAX) 0.25 MG tablet TAKE ONE TABLET AT BEDTIME AS NEEDED  . amiodarone (PACERONE) 200 MG tablet Take 1 tablet (200 mg total) by mouth daily.  . cetirizine (ZYRTEC) 10 MG tablet TAKE ONE TABLET EVERY DAY  . fluticasone (FLONASE) 50 MCG/ACT nasal spray Place 2 sprays into the nose as needed.   Marland Kitchen lisinopril (PRINIVIL,ZESTRIL) 10 MG tablet Take 10 mg by mouth daily.  Marland Kitchen lovastatin (MEVACOR) 20 MG tablet Take 1 tablet (20 mg total) by mouth at bedtime.  . montelukast (SINGULAIR) 10 MG tablet Take 10 mg by mouth at bedtime.  Marland Kitchen zolpidem (AMBIEN) 5 MG tablet TAKE ONE TABLET AT BEDTIME AS NEEDED    Review of Systems  Constitutional: Negative.   HENT: Negative.   Eyes: Negative.   Respiratory: Negative.   Cardiovascular: Negative.   Gastrointestinal: Negative.   Endocrine: Negative.   Musculoskeletal: Negative.   Skin: Negative.   Allergic/Immunologic: Negative.   Neurological: Negative.   Hematological: Negative.   Psychiatric/Behavioral: Negative.   All other systems reviewed and are negative.   BP 138/82  Pulse 65  Ht 5' 6.5" (1.689 m)  Wt 177 lb 4 oz (80.4 kg)  BMI 28.18 kg/m2  Physical Exam  Nursing note and vitals reviewed. Constitutional: She is oriented to person, place, and time. She appears well-developed and well-nourished.  HENT:  Head: Normocephalic.  Nose: Nose normal.  Mouth/Throat: Oropharynx is clear and moist.  Eyes: Conjunctivae are normal. Pupils are equal, round, and  reactive to light.  Neck: Normal range of motion. Neck supple. No JVD present. Carotid bruit is present.  Cardiovascular: Normal rate, regular rhythm, S1 normal, S2 normal, normal heart sounds and intact distal pulses.  Exam reveals no gallop and no friction rub.   No murmur heard. Pulmonary/Chest: Effort normal and breath sounds normal. No respiratory distress. She has no wheezes.  She has no rales. She exhibits no tenderness.  Abdominal: Soft. Bowel sounds are normal. She exhibits no distension. There is no tenderness.  Musculoskeletal: Normal range of motion. She exhibits no edema and no tenderness.  Lymphadenopathy:    She has no cervical adenopathy.  Neurological: She is alert and oriented to person, place, and time. Coordination normal.  Skin: Skin is warm and dry. No rash noted. No erythema.  Psychiatric: She has a normal mood and affect. Her behavior is normal. Judgment and thought content normal.    Assessment and Plan

## 2014-03-07 NOTE — Assessment & Plan Note (Signed)
Blood pressure is well controlled on today's visit. No changes made to the medications. 

## 2014-03-07 NOTE — Assessment & Plan Note (Signed)
She currently uses CPAP with oxygen, nasal pillow. She does not feel like she needs it and would like followup. We will refer her to sleep clinic for further evaluation.

## 2014-03-08 LAB — LIPID PANEL
CHOL/HDL RATIO: 5.7 ratio — AB (ref 0.0–4.4)
CHOLESTEROL TOTAL: 281 mg/dL — AB (ref 100–199)
HDL: 49 mg/dL (ref >39)
LDL Calculated: 202 mg/dL — ABNORMAL HIGH (ref 0–99)
Triglycerides: 148 mg/dL (ref 0–149)
VLDL Cholesterol Cal: 30 mg/dL (ref 5–40)

## 2014-03-08 LAB — VITAMIN D 25 HYDROXY (VIT D DEFICIENCY, FRACTURES): Vit D, 25-Hydroxy: 19.2 ng/mL — ABNORMAL LOW (ref 30.0–100.0)

## 2014-03-08 LAB — TSH: TSH: 4.17 u[IU]/mL (ref 0.450–4.500)

## 2014-03-09 ENCOUNTER — Ambulatory Visit: Payer: Medicare PPO | Admitting: Cardiovascular Disease

## 2014-03-12 ENCOUNTER — Telehealth: Payer: Self-pay

## 2014-03-12 DIAGNOSIS — E785 Hyperlipidemia, unspecified: Secondary | ICD-10-CM

## 2014-03-12 NOTE — Telephone Encounter (Signed)
Pt called requesting lab results, had Vit D drawn last week.

## 2014-03-12 NOTE — Telephone Encounter (Signed)
Pt lab results

## 2014-03-13 NOTE — Telephone Encounter (Signed)
Cholesterol is 280, Very high Is she taking lovastatin 20?  May do better on crestor 20 mg daily   Vit D is very low Would talk with PMD Suggest at least 3,000 units per day

## 2014-03-14 ENCOUNTER — Encounter: Payer: Self-pay | Admitting: Internal Medicine

## 2014-03-14 ENCOUNTER — Telehealth: Payer: Self-pay

## 2014-03-14 ENCOUNTER — Ambulatory Visit (INDEPENDENT_AMBULATORY_CARE_PROVIDER_SITE_OTHER): Payer: Medicare PPO | Admitting: Internal Medicine

## 2014-03-14 VITALS — BP 124/66 | HR 76 | Temp 97.8°F | Wt 176.0 lb

## 2014-03-14 DIAGNOSIS — E559 Vitamin D deficiency, unspecified: Secondary | ICD-10-CM

## 2014-03-14 DIAGNOSIS — R5383 Other fatigue: Secondary | ICD-10-CM

## 2014-03-14 DIAGNOSIS — R5381 Other malaise: Secondary | ICD-10-CM

## 2014-03-14 LAB — VITAMIN B12: Vitamin B-12: 406 pg/mL (ref 211–911)

## 2014-03-14 MED ORDER — ROSUVASTATIN CALCIUM 20 MG PO TABS: 20.0000 mg | ORAL_TABLET | Freq: Every day | ORAL | Status: DC

## 2014-03-14 MED ORDER — VITAMIN D (ERGOCALCIFEROL) 1.25 MG (50000 UNIT) PO CAPS
50000.0000 [IU] | ORAL_CAPSULE | ORAL | Status: DC
Start: 2014-03-14 — End: 2014-08-29

## 2014-03-14 NOTE — Telephone Encounter (Signed)
Reviewed results w/ pt.  She states that she does not want to try Crestor, as she thinks she has heard bad things about it and is pretty sure that she took it previously, but cannot remember. She states that she has not been adhering to her diet recently, as she has been depressed and eating a lot of sweets.  She would like to modify her diet and have her chol rechecked in a few months to see if this helps.  She will contact her PCP to discuss Vit D and call us w/ any questions or concerns.

## 2014-03-14 NOTE — Patient Instructions (Addendum)
Vitamin D Deficiency Vitamin D is an important vitamin that your body needs. Having too little of it in your body is called a deficiency. A very bad deficiency can make your bones soft and can cause a condition called rickets.  Vitamin D is important to your body for different reasons, such as:   It helps your body absorb 2 minerals called calcium and phosphorus.  It helps make your bones healthy.  It may prevent some diseases, such as diabetes and multiple sclerosis.  It helps your muscles and heart. You can get vitamin D in several ways. It is a natural part of some foods. The vitamin is also added to some dairy products and cereals. Some people take vitamin D supplements. Also, your body makes vitamin D when you are in the sun. It changes the sun's rays into a form of the vitamin that your body can use. CAUSES   Not eating enough foods that contain vitamin D.  Not getting enough sunlight.  Having certain digestive system diseases that make it hard to absorb vitamin D. These diseases include Crohn's disease, chronic pancreatitis, and cystic fibrosis.  Having a surgery in which part of the stomach or small intestine is removed.  Being obese. Fat cells pull vitamin D out of your blood. That means that obese people may not have enough vitamin D left in their blood and in other body tissues.  Having chronic kidney or liver disease. RISK FACTORS Risk factors are things that make you more likely to develop a vitamin D deficiency. They include:  Being older.  Not being able to get outside very much.  Living in a nursing home.  Having had broken bones.  Having weak or thin bones (osteoporosis).  Having a disease or condition that changes how your body absorbs vitamin D.  Having dark skin.  Some medicines such as seizure medicines or steroids.  Being overweight or obese. SYMPTOMS Mild cases of vitamin D deficiency may not have any symptoms. If you have a very bad case, symptoms  may include:  Bone pain.  Muscle pain.  Falling often.  Broken bones caused by a minor injury, due to osteoporosis. DIAGNOSIS A blood test is the best way to tell if you have a vitamin D deficiency. TREATMENT Vitamin D deficiency can be treated in different ways. Treatment for vitamin D deficiency depends on what is causing it. Options include:  Taking vitamin D supplements.  Taking a calcium supplement. Your caregiver will suggest what dose is best for you. HOME CARE INSTRUCTIONS  Take any supplements that your caregiver prescribes. Follow the directions carefully. Take only the suggested amount.  Have your blood tested 2 months after you start taking supplements.  Eat foods that contain vitamin D. Healthy choices include:  Fortified dairy products, cereals, or juices. Fortified means vitamin D has been added to the food. Check the label on the package to be sure.  Fatty fish like salmon or trout.  Eggs.  Oysters.  Do not use a tanning bed.  Keep your weight at a healthy level. Lose weight if you need to.  Keep all follow-up appointments. Your caregiver will need to perform blood tests to make sure your vitamin D deficiency is going away. SEEK MEDICAL CARE IF:  You have any questions about your treatment.  You continue to have symptoms of vitamin D deficiency.  You have nausea or vomiting.  You are constipated.  You feel confused.  You have severe abdominal or back pain. MAKE   SURE YOU:  Understand these instructions.  Will watch your condition.  Will get help right away if you are not doing well or get worse. Document Released: 10/26/2011 Document Revised: 11/28/2012 Document Reviewed: 10/26/2011 Exit[employer] Patient Information 2015 [employer]. This information is not intended to replace advice given to you by your health care provider. Make sure you discuss any questions you have with your health care provider.  

## 2014-03-14 NOTE — Telephone Encounter (Signed)
I though she was already taking pravastatin 20 mg daily? If not, Would start Diet and exercise will help, but will likely continue to run high Will need a pill for any significant drop

## 2014-03-14 NOTE — Progress Notes (Signed)
Subjective:    Patient ID: Jill Snow, female    DOB: April 01, 1934, 80 y.o.   MRN: [id number]  HPI  Pt presents to the clinic today to discuss her Vit D levels. She has her labs drawn by cardiology. Dr. Philmore Pali advised her that her Vit D was very low and that she should start 3000 units of Vit D daily. She has not started that yet. Her level was 19. She c/o fatigue and joint pains. She has had a bone density exam within the last 3 years which was normal by her report. She would also like to have her b12 checked as well.  Review of Systems      Past Medical History  Diagnosis Date  . Atrial fibrillation     paroxysmal  . Hypertension   . Hypercholesterolemia   . Mitral insufficiency     Mild mitral insufficiency  . Dyspnea   . Seasonal allergies   . Sleep apnea   . RBBB (right bundle branch block)   . History of chicken pox   . Arrhythmia     Current Outpatient Prescriptions  Medication Sig Dispense Refill  . ALPRAZolam (XANAX) 0.25 MG tablet TAKE ONE TABLET AT BEDTIME AS NEEDED  30 tablet  0  . amiodarone (PACERONE) 200 MG tablet Take 1 tablet (200 mg total) by mouth daily.  30 tablet  6  . cetirizine (ZYRTEC) 10 MG tablet TAKE ONE TABLET EVERY DAY  30 tablet  6  . fluticasone (FLONASE) 50 MCG/ACT nasal spray Place 2 sprays into the nose as needed.       Marland Kitchen lisinopril (PRINIVIL,ZESTRIL) 10 MG tablet Take 10 mg by mouth daily.      Marland Kitchen lovastatin (MEVACOR) 20 MG tablet Take 1 tablet (20 mg total) by mouth at bedtime.  30 tablet  6  . montelukast (SINGULAIR) 10 MG tablet Take 10 mg by mouth at bedtime.      Marland Kitchen zolpidem (AMBIEN) 5 MG tablet TAKE ONE TABLET AT BEDTIME AS NEEDED  30 tablet  0   No current facility-administered medications for this visit.    Allergies  Allergen Reactions  . Codeine     Family History  Problem Relation Age of Onset  . Stroke Mother   . Heart attack Mother   . Arthritis Mother   . Hyperlipidemia Mother   . Heart disease Mother   .  Diabetes Mother   . Diabetes Sister   . Cancer Daughter   . Heart disease Daughter     History   Social History  . Marital Status: Widowed    Spouse Name: N/A    Number of Children: 2  . Years of Education: N/A   Occupational History  . REALTOR    Social History Main Topics  . Smoking status: Never Smoker   . Smokeless tobacco: Not on file  . Alcohol Use: No  . Drug Use: No  . Sexual Activity: No   Other Topics Concern  . Not on file   Social History Narrative  . No narrative on file     Constitutional: Pt reports fatigue. Denies fever, malaise, headache or abrupt weight changes.  Respiratory: Denies difficulty breathing, shortness of breath, cough or sputum production.   Cardiovascular: Denies chest pain, chest tightness, palpitations or swelling in the hands or feet.  Musculoskeletal: Pt reports joint pain. Denies decrease in range of motion, difficulty with gait, muscle pain or joint swelling.  Neurological: Denies dizziness, difficulty with memory, difficulty  with speech or problems with balance and coordination.   No other specific complaints in a complete review of systems (except as listed in HPI above).  Objective:   Physical Exam   BP 124/66  Pulse 76  Temp(Src) 97.8 F (36.6 C) (Oral)  Wt 176 lb (79.833 kg)  SpO2 98% Wt Readings from Last 3 Encounters:  03/14/14 176 lb (79.833 kg)  03/07/14 177 lb 4 oz (80.4 kg)  01/02/14 177 lb 8 oz (80.513 kg)    General: Appears her stated age, well developed, well nourished in NAD. Cardiovascular: Normal rate and rhythm. S1,S2 noted.  No murmur, rubs or gallops noted. No JVD or BLE edema. No carotid bruits noted. Pulmonary/Chest: Normal effort and positive vesicular breath sounds. No respiratory distress. No wheezes, rales or ronchi noted.  Neurological: Alert and oriented. Cranial nerves grossly intact.   BMET    Component Value Date/Time   NA 137 03/06/2010 1411   K 3.8 03/06/2010 1411   CL 108  03/06/2010 1411   CO2 21 03/06/2010 1411   GLUCOSE 135* 03/06/2010 1411   BUN 8 03/06/2010 1411   CREATININE 0.64 03/06/2010 1411   CALCIUM 8.9 03/06/2010 1411   GFRNONAA >60 03/06/2010 1411   GFRAA  Value: >60        The eGFR has been calculated using the MDRD equation. This calculation has not been validated in all clinical situations. eGFR's persistently <60 mL/min signify possible Chronic Kidney Disease. 03/06/2010 1411    Lipid Panel     Component Value Date/Time   CHOL  Value: 148        ATP III CLASSIFICATION:  <200     mg/dL   Desirable  200-239  mg/dL   Borderline High  >=240    mg/dL   High        03/07/2010 0440   TRIG 148 03/07/2014 0832   HDL 49 03/07/2014 0832   HDL 39* 03/07/2010 0440   CHOLHDL 5.7* 03/07/2014 0832   CHOLHDL 3.8 03/07/2010 0440   VLDL 38 03/07/2010 0440   LDLCALC 202* 03/07/2014 0832   LDLCALC  Value: 71        Total Cholesterol/HDL:CHD Risk Coronary Heart Disease Risk Table                     Men   Women  1/2 Average Risk   3.4   3.3  Average Risk       5.0   4.4  2 X Average Risk   9.6   7.1  3 X Average Risk  23.4   11.0        Use the calculated Patient Ratio above and the CHD Risk Table to determine the patient's CHD Risk.        ATP III CLASSIFICATION (LDL):  <100     mg/dL   Optimal  100-129  mg/dL   Near or Above                    Optimal  130-159  mg/dL   Borderline  160-189  mg/dL   High  >190     mg/dL   Very High 03/07/2010 0440    CBC    Component Value Date/Time   WBC 6.1 03/06/2010 1710   RBC 4.52 03/06/2010 1710   HGB 14.6 03/06/2010 1710   HCT 42.5 03/06/2010 1710   PLT 187 03/06/2010 1710   MCV 93.9 03/06/2010 1710   MCH 32.4 03/06/2010  1710   MCHC 34.5 03/06/2010 1710   RDW 12.4 03/06/2010 1710    Hgb A1C No results found for this basename: HGBA1C        Assessment & Plan:   Vit D deficiency:  Will give RX for ergocalciferol 50000 units weekly x 12 weeks RTC in 12 weeks for lab on ly appt to recheck vit D  Fatigue:  Will also check B12  today and let you know if you need to start supplements for that  RTC as needed

## 2014-03-14 NOTE — Telephone Encounter (Signed)
Spoke w/ pt.  After some discussion, she would like to try Crestor 20 mg daily w/ the understanding that if she experiences any unpleasant side effects, we can definitely try something different.  Advised pt that I am leaving samples at the front desk for her to pick up at her convenience, as well as a low cholesterol diet.  She is appreciative and will call w/ any other questions or concerns.

## 2014-03-14 NOTE — Progress Notes (Signed)
Pre visit review using our clinic review tool, if applicable. No additional management support is needed unless otherwise documented below in the visit note. 

## 2014-03-14 NOTE — Telephone Encounter (Signed)
Spoke w/ pt earlier today regarding her labs:  Reviewed results w/ pt. She states that she does not want to try Crestor, as she thinks she has heard bad things about it and is pretty sure that she took it previously, but cannot remember.  She states that she has not been adhering to her diet recently, as she has been depressed and eating a lot of sweets.  She would like to modify her diet and have her chol rechecked in a few months to see if this helps.  She will contact her PCP to discuss Vit D and call us w/ any questions or concerns.   She states that she spoke w/ her daughter who has had good results on pravastatin and would like to try this.  Please advise. Thank you.

## 2014-03-14 NOTE — Telephone Encounter (Signed)
Pt is returning your call regarding her Crestor. Please call.

## 2014-03-16 ENCOUNTER — Other Ambulatory Visit: Payer: Self-pay | Admitting: Internal Medicine

## 2014-03-19 NOTE — Telephone Encounter (Signed)
Rx called to pharmacy as instructed. 

## 2014-03-19 NOTE — Telephone Encounter (Signed)
Ok to phone in xanax 

## 2014-03-20 ENCOUNTER — Other Ambulatory Visit: Payer: Self-pay | Admitting: Cardiovascular Disease

## 2014-04-12 ENCOUNTER — Institutional Professional Consult (permissible substitution): Payer: Medicare PPO | Admitting: Pulmonary Disease

## 2014-04-28 ENCOUNTER — Other Ambulatory Visit: Payer: Self-pay | Admitting: Internal Medicine

## 2014-04-30 NOTE — Telephone Encounter (Signed)
Last filled 03/19/14--please advise

## 2014-04-30 NOTE — Telephone Encounter (Signed)
Rx called in to pharmacy. 

## 2014-05-15 ENCOUNTER — Encounter: Payer: Self-pay | Admitting: Family Medicine

## 2014-05-15 ENCOUNTER — Ambulatory Visit (INDEPENDENT_AMBULATORY_CARE_PROVIDER_SITE_OTHER): Payer: Medicare PPO | Admitting: Family Medicine

## 2014-05-15 ENCOUNTER — Institutional Professional Consult (permissible substitution): Payer: Medicare PPO | Admitting: Pulmonary Disease

## 2014-05-15 VITALS — BP 120/60 | HR 68 | Temp 97.9°F | Ht 66.5 in | Wt 175.0 lb

## 2014-05-15 DIAGNOSIS — Z23 Encounter for immunization: Secondary | ICD-10-CM

## 2014-05-15 DIAGNOSIS — J069 Acute upper respiratory infection, unspecified: Secondary | ICD-10-CM

## 2014-05-15 NOTE — Progress Notes (Signed)
See scanned in note.

## 2014-05-15 NOTE — Progress Notes (Signed)
Pre visit review using our clinic review tool, if applicable. No additional management support is needed unless otherwise documented below in the visit note. 

## 2014-05-15 NOTE — Addendum Note (Signed)
Addended by: Eliezer Lofts E on: 05/15/2014 04:16 PM   Modules accepted: Level of Service

## 2014-05-21 ENCOUNTER — Other Ambulatory Visit: Payer: Self-pay | Admitting: Family Medicine

## 2014-06-04 ENCOUNTER — Other Ambulatory Visit: Payer: Self-pay | Admitting: Internal Medicine

## 2014-06-05 NOTE — Telephone Encounter (Signed)
Rx called in as prescribed pt advise she is due for f/u with Webb Silversmith, NP in winter, pt said her calender is at work and she will call back to schedule appt once she get to work

## 2014-06-05 NOTE — Telephone Encounter (Signed)
Alprazolam #30x0 last filled 04/30/14, Zolpidem #30x0 last filled 03/06/14. Pt est in 11/15, last appt was an acute visit for URI on 05/15/14. No future appts scheduled. Ok to refill?

## 2014-06-05 NOTE — Telephone Encounter (Signed)
Please schedule her an appt for winter  Px written for call in

## 2014-06-06 ENCOUNTER — Other Ambulatory Visit: Payer: Self-pay | Admitting: Cardiovascular Disease

## 2014-06-06 ENCOUNTER — Other Ambulatory Visit: Payer: Self-pay

## 2014-06-06 NOTE — Telephone Encounter (Signed)
Medications have not been filled by you yet--please advise--also received request that pt wants mail order for Xanax and Ambien please advise for 30 day or will have to stay local

## 2014-06-06 NOTE — Telephone Encounter (Signed)
She is followed by cardiology who is prescribing the amiodarone and lisinopril. She should have them refill those. Ambien and xanax can only have 30 day supply. No mail order for controlled substances.

## 2014-06-07 ENCOUNTER — Other Ambulatory Visit: Payer: Self-pay | Admitting: Internal Medicine

## 2014-06-14 ENCOUNTER — Telehealth: Payer: Self-pay | Admitting: Cardiovascular Disease

## 2014-06-14 ENCOUNTER — Other Ambulatory Visit: Payer: Self-pay | Admitting: *Deleted

## 2014-06-14 ENCOUNTER — Other Ambulatory Visit: Payer: Self-pay | Admitting: Cardiovascular Disease

## 2014-06-14 ENCOUNTER — Telehealth: Payer: Self-pay | Admitting: *Deleted

## 2014-06-14 MED ORDER — ROSUVASTATIN CALCIUM 20 MG PO TABS: 20.0000 mg | ORAL_TABLET | Freq: Every day | ORAL | Status: DC

## 2014-06-14 NOTE — Telephone Encounter (Signed)
Crestor Rx sent to [facility] #30 R#3  Pt made aware that we do not refill vitamin D that would have to be refilled by pcp not cardiology medication.

## 2014-06-14 NOTE — Telephone Encounter (Signed)
Requested Prescriptions   Signed Prescriptions Disp Refills  . rosuvastatin (CRESTOR) 20 MG tablet 30 tablet 3    Sig: Take 1 tablet (20 mg total) by mouth daily.    Authorizing Provider: Minna Merritts    Ordering User: Britt Bottom

## 2014-06-14 NOTE — Telephone Encounter (Signed)
Pt is calling so we can refill her Vitamin and dr wanted her to go on crestor can we call them in for her,  [facility], [address].

## 2014-06-20 ENCOUNTER — Other Ambulatory Visit: Payer: Self-pay | Admitting: Internal Medicine

## 2014-06-22 ENCOUNTER — Ambulatory Visit: Payer: Medicare PPO | Admitting: Internal Medicine

## 2014-06-25 ENCOUNTER — Telehealth: Payer: Self-pay | Admitting: Internal Medicine

## 2014-06-25 ENCOUNTER — Encounter: Payer: Self-pay | Admitting: Internal Medicine

## 2014-06-25 ENCOUNTER — Ambulatory Visit (INDEPENDENT_AMBULATORY_CARE_PROVIDER_SITE_OTHER): Payer: Medicare PPO | Admitting: Internal Medicine

## 2014-06-25 VITALS — BP 114/70 | HR 74 | Temp 97.7°F | Wt 172.0 lb

## 2014-06-25 DIAGNOSIS — E559 Vitamin D deficiency, unspecified: Secondary | ICD-10-CM

## 2014-06-25 DIAGNOSIS — E785 Hyperlipidemia, unspecified: Secondary | ICD-10-CM

## 2014-06-25 DIAGNOSIS — M25572 Pain in left ankle and joints of left foot: Secondary | ICD-10-CM

## 2014-06-25 DIAGNOSIS — R5383 Other fatigue: Secondary | ICD-10-CM

## 2014-06-25 LAB — COMPREHENSIVE METABOLIC PANEL
ALBUMIN: 3.7 g/dL (ref 3.5–5.2)
ALK PHOS: 62 U/L (ref 39–117)
ALT: 31 U/L (ref 0–35)
AST: 35 U/L (ref 0–37)
BUN: 12 mg/dL (ref 6–23)
CHLORIDE: 106 meq/L (ref 96–112)
CO2: 25 mEq/L (ref 19–32)
Calcium: 9.8 mg/dL (ref 8.4–10.5)
Creatinine, Ser: 0.9 mg/dL (ref 0.4–1.2)
GFR: 64.26 mL/min (ref >60.00)
Glucose, Bld: 84 mg/dL (ref 70–99)
POTASSIUM: 4.1 meq/L (ref 3.5–5.1)
SODIUM: 141 meq/L (ref 135–145)
TOTAL PROTEIN: 7.6 g/dL (ref 6.0–8.3)
Total Bilirubin: 0.9 mg/dL (ref 0.2–1.2)

## 2014-06-25 LAB — CBC
HCT: 44 % (ref 36.0–46.0)
Hemoglobin: 14.7 g/dL (ref 12.0–15.0)
MCHC: 33.3 g/dL (ref 30.0–36.0)
MCV: 95.6 fl (ref 78.0–100.0)
PLATELETS: 200 10*3/uL (ref 150.0–400.0)
RBC: 4.61 Mil/uL (ref 3.87–5.11)
RDW: 12.6 % (ref 11.5–15.5)
WBC: 5.1 10*3/uL (ref 4.0–10.5)

## 2014-06-25 LAB — VITAMIN D 25 HYDROXY (VIT D DEFICIENCY, FRACTURES): VITD: 38.53 ng/mL (ref 30.00–100.00)

## 2014-06-25 LAB — LIPID PANEL
CHOL/HDL RATIO: 6
Cholesterol: 273 mg/dL — ABNORMAL HIGH (ref 0–200)
HDL: 43.9 mg/dL (ref >39.00)
LDL Cholesterol: 195 mg/dL — ABNORMAL HIGH (ref 0–99)
NONHDL: 229.1
Triglycerides: 169 mg/dL — ABNORMAL HIGH (ref 0.0–149.0)
VLDL: 33.8 mg/dL (ref 0.0–40.0)

## 2014-06-25 LAB — TSH: TSH: 3.19 u[IU]/mL (ref 0.35–4.50)

## 2014-06-25 NOTE — Telephone Encounter (Signed)
Pt needs to schedule follow up appt 2 months from 06/25/14 per [facility]. Called to r/s left vm.

## 2014-06-25 NOTE — Patient Instructions (Signed)

## 2014-06-25 NOTE — Progress Notes (Signed)
Pre visit review using our clinic review tool, if applicable. No additional management support is needed unless otherwise documented below in the visit note. 

## 2014-06-25 NOTE — Progress Notes (Signed)
HPI  Jill Snow is a 80yrold female being seen for increased fatigue. She is concerned that her Vit D level is low again because she has been out of her prescription. She denies any changes in her diet, temperature, hair loss, malaise, weight loss, or depression/anxiety. She states she sleeps well when taking ambien, but still feels increasingly tired throughout the day. She is able to function and complete all her daily routines without difficulty. She denies any chest pain or shortness of breath.  She did fall 4 days ago while cleaning her house. She denies any overall complaints except left ankle swelling, which she states has improved. She is going to her chiropractor for her back, but otherwise feels she is recovering well. Denies any bruising.  She does request a refill on her Crestor which was started by Dr. GHilma Favors her cardiologist.  Past Medical History  Diagnosis Date  . Atrial fibrillation     paroxysmal  . Hypertension   . Hypercholesterolemia   . Mitral insufficiency     Mild mitral insufficiency  . Dyspnea   . Seasonal allergies   . Sleep apnea   . RBBB (right bundle branch block)   . History of chicken pox   . Arrhythmia     Current Outpatient Prescriptions  Medication Sig Dispense Refill  . ALPRAZolam (XANAX) 0.25 MG tablet TAKE 1 TABLET AT BEDTIME AS NEEDED 30 tablet 0  . amiodarone (PACERONE) 200 MG tablet TAKE ONE TABLET EVERY DAY 30 tablet 3  . fluticasone (FLONASE) 50 MCG/ACT nasal spray Place 2 sprays into the nose as needed.     .Marland Kitchenlisinopril (PRINIVIL,ZESTRIL) 10 MG tablet Take 10 mg by mouth daily.    . montelukast (SINGULAIR) 10 MG tablet Take 10 mg by mouth at bedtime.    . rosuvastatin (CRESTOR) 20 MG tablet Take 1 tablet (20 mg total) by mouth daily. 30 tablet 3  . Vitamin D, Ergocalciferol, (DRISDOL) 50000 UNITS CAPS capsule Take 1 capsule (50,000 Units total) by mouth every 7 (seven) days. 12 capsule 0  . zolpidem (AMBIEN) 5 MG tablet TAKE ONE TABLET  BY MOUTH AT BEDTIME AS NEEDED 30 tablet 0   No current facility-administered medications for this visit.    Allergies  Allergen Reactions  . Codeine     Family History  Problem Relation Age of Onset  . Stroke Mother   . Heart attack Mother   . Arthritis Mother   . Hyperlipidemia Mother   . Heart disease Mother   . Diabetes Mother   . Diabetes Sister   . Cancer Daughter   . Heart disease Daughter     History   Social History  . Marital Status: Widowed    Spouse Name: N/A    Number of Children: 2  . Years of Education: N/A   Occupational History  . REALTOR    Social History Main Topics  . Smoking status: Never Smoker   . Smokeless tobacco: Never Used  . Alcohol Use: No  . Drug Use: No  . Sexual Activity: No   Other Topics Concern  . Not on file   Social History Narrative    ROS:  Constitutional: Endorses fatigue. Denies fever, malaise, headache or abrupt weight changes.  Respiratory: Denies difficulty breathing, shortness of breath, cough or sputum production.   Cardiovascular: Denies chest pain, chest tightness, palpitations or swelling in the hands or feet.  Musculoskeletal: Endorses left ankle swelling. Denies decrease in range of motion, difficulty with gait, muscle  pain or joint pain.  Skin: Denies redness, rashes, lesions or ulcercations.  Neurological: Denies dizziness, difficulty with memory, difficulty with speech or problems with balance and coordination.   No other specific complaints in a complete review of systems (except as listed in HPI above).  PE:  BP 114/70 mmHg  Pulse 74  Temp(Src) 97.7 F (36.5 C) (Oral)  Wt 172 lb (78.019 kg)  SpO2 97% Wt Readings from Last 3 Encounters:  06/25/14 172 lb (78.019 kg)  05/15/14 175 lb (79.379 kg)  03/14/14 176 lb (79.833 kg)    General: Appears their stated age, well developed, well nourished in NAD. Neck: Mild thyromegaly. No difficulty swallowing. Cardiovascular: Irregular rate. Normal rate  and rhythm. S1,S2 noted.  No murmur, rubs or gallops noted. No JVD or BLE edema. No carotid bruits noted. Pulmonary/Chest: Normal effort and positive vesicular breath sounds. No respiratory distress. No wheezes, rales or ronchi noted.  Abdomen: Soft and nontender. Normal bowel sounds, no bruits noted. No distention or masses noted. Liver, spleen and kidneys non palpable. Musculoskeletal: Mild swelling to left ankle noted, no bruising. Normal range of motion. No signs of joint swelling. No difficulty with gait.  Neurological: Alert and oriented. Cranial nerves II-XII intact. Coordination normal. +DTRs bilaterally. Psychiatric: Mood and affect normal. Behavior is normal. Judgment and thought content normal.   EKG:  BMET    Component Value Date/Time   NA 137 03/06/2010 1411   K 3.8 03/06/2010 1411   CL 108 03/06/2010 1411   CO2 21 03/06/2010 1411   GLUCOSE 135* 03/06/2010 1411   BUN 8 03/06/2010 1411   CREATININE 0.64 03/06/2010 1411   CALCIUM 8.9 03/06/2010 1411   GFRNONAA >60 03/06/2010 1411   GFRAA  03/06/2010 1411    >60        The eGFR has been calculated using the MDRD equation. This calculation has not been validated in all clinical situations. eGFR's persistently <60 mL/min signify possible Chronic Kidney Disease.    Lipid Panel     Component Value Date/Time   CHOL  03/07/2010 0440    148        ATP III CLASSIFICATION:  <200     mg/dL   Desirable  200-239  mg/dL   Borderline High  >=240    mg/dL   High          TRIG 148 03/07/2014 0832   HDL 49 03/07/2014 0832   HDL 39* 03/07/2010 0440   CHOLHDL 5.7* 03/07/2014 0832   CHOLHDL 3.8 03/07/2010 0440   VLDL 38 03/07/2010 0440   LDLCALC 202* 03/07/2014 0832   LDLCALC  03/07/2010 0440    71        Total Cholesterol/HDL:CHD Risk Coronary Heart Disease Risk Table                     Men   Women  1/2 Average Risk   3.4   3.3  Average Risk       5.0   4.4  2 X Average Risk   9.6   7.1  3 X Average Risk  23.4    11.0        Use the calculated Patient Ratio above and the CHD Risk Table to determine the patient's CHD Risk.        ATP III CLASSIFICATION (LDL):  <100     mg/dL   Optimal  100-129  mg/dL   Near or Above  Optimal  130-159  mg/dL   Borderline  160-189  mg/dL   High  >190     mg/dL   Very High    CBC    Component Value Date/Time   WBC 6.1 03/06/2010 1710   RBC 4.52 03/06/2010 1710   HGB 14.6 03/06/2010 1710   HCT 42.5 03/06/2010 1710   PLT 187 03/06/2010 1710   MCV 93.9 03/06/2010 1710   MCH 32.4 03/06/2010 1710   MCHC 34.5 03/06/2010 1710   RDW 12.4 03/06/2010 1710    Hgb A1C No results found for: HGBA1C   Assessment and Plan:  Fatigue: Exam is overall normal.  Will check labs today: Vitamin D level, CBC, CMET, TSH Will treat following results  Hyperlipidemia: Will recheck lipid panel today Refilled Crestor Follow up in 3-6 months for routine visit  Left ankle sprain RICE Ibuprofen/tylenol for pain/swelling Call for any changes or concerns  Westley Foots, Student-NP

## 2014-06-25 NOTE — Progress Notes (Signed)
 Subjective:    Patient ID: Jill Snow, female    DOB: 11/08/1933, 80 y.o.   MRN: [id number]  HPI  Pt presents to the clinic today with c/o fatigue. She reports she has felt more fatigued since she stop the prescription the Vit D. She would like a refill of that today. She reports that she is sleeping okay with the use of the ambien.   She reports that she did fall about 4 days ago. She was cleaning her house, when she unexpectedly tripped and fell. She c/o pain mostly in her right ankle. She reports that it is sore.  Additionally, she is requesting a RX for Crestor. She reports that Dr. Gollan wants her to switch from Lipitor to Crestor. She reports that Dr. Gollan did give her samples but never gave her a prescription. She finished the prescription, and reports she started taking her Lipitor that she previously had. She has been working on a low fat diet.  Review of Systems   Past Medical History  Diagnosis Date  . Atrial fibrillation     paroxysmal  . Hypertension   . Hypercholesterolemia   . Mitral insufficiency     Mild mitral insufficiency  . Dyspnea   . Seasonal allergies   . Sleep apnea   . RBBB (right bundle branch block)   . History of chicken pox   . Arrhythmia     Current Outpatient Prescriptions  Medication Sig Dispense Refill  . ALPRAZolam (XANAX) 0.25 MG tablet TAKE 1 TABLET AT BEDTIME AS NEEDED 30 tablet 0  . amiodarone (PACERONE) 200 MG tablet TAKE ONE TABLET EVERY DAY 30 tablet 3  . fluticasone (FLONASE) 50 MCG/ACT nasal spray Place 2 sprays into the nose as needed.     . lisinopril (PRINIVIL,ZESTRIL) 10 MG tablet Take 10 mg by mouth daily.    . montelukast (SINGULAIR) 10 MG tablet Take 10 mg by mouth at bedtime.    . rosuvastatin (CRESTOR) 20 MG tablet Take 1 tablet (20 mg total) by mouth daily. 30 tablet 3  . Vitamin D, Ergocalciferol, (DRISDOL) 50000 UNITS CAPS capsule Take 1 capsule (50,000 Units total) by mouth every 7 (seven) days. 12 capsule 0    . zolpidem (AMBIEN) 5 MG tablet TAKE ONE TABLET BY MOUTH AT BEDTIME AS NEEDED 30 tablet 0   No current facility-administered medications for this visit.    Allergies  Allergen Reactions  . Codeine     Family History  Problem Relation Age of Onset  . Stroke Mother   . Heart attack Mother   . Arthritis Mother   . Hyperlipidemia Mother   . Heart disease Mother   . Diabetes Mother   . Diabetes Sister   . Cancer Daughter   . Heart disease Daughter     History   Social History  . Marital Status: Widowed    Spouse Name: N/A    Number of Children: 2  . Years of Education: N/A   Occupational History  . REALTOR    Social History Main Topics  . Smoking status: Never Smoker   . Smokeless tobacco: Never Used  . Alcohol Use: No  . Drug Use: No  . Sexual Activity: No   Other Topics Concern  . Not on file   Social History Narrative     Constitutional: Pt reports fatigue. Denies fever, malaise, headache or abrupt weight changes.  Respiratory: Denies difficulty breathing, shortness of breath, cough or sputum production.   Cardiovascular: Denies chest   pain, chest tightness, palpitations or swelling in the hands or feet.  Musculoskeletal: Pt reports left ankle pain. Denies decrease in range of motion, difficulty with gait, muscle pain or joint swelling.    No other specific complaints in a complete review of systems (except as listed in HPI above).   Objective:   Physical Exam  BP 114/70 mmHg  Pulse 74  Temp(Src) 97.7 F (36.5 C) (Oral)  Wt 172 lb (78.019 kg)  SpO2 97% Wt Readings from Last 3 Encounters:  06/25/14 172 lb (78.019 kg)  05/15/14 175 lb (79.379 kg)  03/14/14 176 lb (79.833 kg)    General: Appears her stated age, well developed, well nourished in NAD. Cardiovascular: Normal rate and rhythm. S1,S2 noted.  No murmur, rubs or gallops noted.  Pulmonary/Chest: Normal effort and positive vesicular breath sounds. No respiratory distress. No wheezes, rales  or ronchi noted.  Musculoskeletal: Normal flexion, extension and rotation of the left ankle. No signs of joint swelling. No difficulty with gait.   BMET    Component Value Date/Time   NA 137 03/06/2010 1411   K 3.8 03/06/2010 1411   CL 108 03/06/2010 1411   CO2 21 03/06/2010 1411   GLUCOSE 135* 03/06/2010 1411   BUN 8 03/06/2010 1411   CREATININE 0.64 03/06/2010 1411   CALCIUM 8.9 03/06/2010 1411   GFRNONAA >60 03/06/2010 1411   GFRAA  03/06/2010 1411    >60        The eGFR has been calculated using the MDRD equation. This calculation has not been validated in all clinical situations. eGFR's persistently <60 mL/min signify possible Chronic Kidney Disease.    Lipid Panel     Component Value Date/Time   CHOL  03/07/2010 0440    148        ATP III CLASSIFICATION:  <200     mg/dL   Desirable  200-239  mg/dL   Borderline High  >=240    mg/dL   High          TRIG 148 03/07/2014 0832   HDL 49 03/07/2014 0832   HDL 39* 03/07/2010 0440   CHOLHDL 5.7* 03/07/2014 0832   CHOLHDL 3.8 03/07/2010 0440   VLDL 38 03/07/2010 0440   LDLCALC 202* 03/07/2014 0832   LDLCALC  03/07/2010 0440    71        Total Cholesterol/HDL:CHD Risk Coronary Heart Disease Risk Table                     Men   Women  1/2 Average Risk   3.4   3.3  Average Risk       5.0   4.4  2 X Average Risk   9.6   7.1  3 X Average Risk  23.4   11.0        Use the calculated Patient Ratio above and the CHD Risk Table to determine the patient's CHD Risk.        ATP III CLASSIFICATION (LDL):  <100     mg/dL   Optimal  100-129  mg/dL   Near or Above                    Optimal  130-159  mg/dL   Borderline  160-189  mg/dL   High  >190     mg/dL   Very High    CBC    Component Value Date/Time   WBC 6.1 03/06/2010 1710   RBC 4.52  03/06/2010 1710   HGB 14.6 03/06/2010 1710   HCT 42.5 03/06/2010 1710   PLT 187 03/06/2010 1710   MCV 93.9 03/06/2010 1710   MCH 32.4 03/06/2010 1710   MCHC 34.5 03/06/2010  1710   RDW 12.4 03/06/2010 1710    Hgb A1C No results found for: HGBA1C       Assessment & Plan:   Fatigue:  Will recheck CBC, TSH and Vit D Discussed continuing the 50,000 units of Vit D versus 2000 units of Vit D OTC (will depend on lab results)  Hyperlipidemia:  Will check CMET and Lipid profile today Once gt results back- will call in RX for crestor  Left ankle pain s/p fall:  Likely just soft tissue injury/sprain Encouraged her to put ice/ibuprofen for swelling and pain Wear shoes that support the ankle  RTC in 1-2 months for followup of chronic conditions 

## 2014-06-26 MED ORDER — ROSUVASTATIN CALCIUM 10 MG PO TABS: 10.0000 mg | ORAL_TABLET | Freq: Every day | ORAL | Status: DC

## 2014-06-26 NOTE — Addendum Note (Signed)
Addended by: Lurlean Nanny on: 06/26/2014 04:53 PM   Modules accepted: Orders

## 2014-07-03 ENCOUNTER — Other Ambulatory Visit: Payer: Self-pay | Admitting: Family Medicine

## 2014-07-04 NOTE — Telephone Encounter (Signed)
Ok to phone in xanax on 07/06/14

## 2014-07-05 NOTE — Telephone Encounter (Signed)
Pt left v/m requesting cb with status of xanax refill to [facility].Please advise.

## 2014-07-06 NOTE — Telephone Encounter (Signed)
Rx called in to pharmacy. 

## 2014-08-13 ENCOUNTER — Other Ambulatory Visit: Payer: Self-pay | Admitting: Internal Medicine

## 2014-08-13 NOTE — Telephone Encounter (Signed)
Last filled 07/04/14--please advise

## 2014-08-13 NOTE — Telephone Encounter (Signed)
Ok to phone in xanax 

## 2014-08-13 NOTE — Telephone Encounter (Signed)
Rx called in to pharmacy. 

## 2014-08-14 ENCOUNTER — Telehealth: Payer: Self-pay

## 2014-08-14 NOTE — Telephone Encounter (Signed)
PCP made this change, they will need to address.

## 2014-08-14 NOTE — Telephone Encounter (Signed)
The patient would like to come off crestor because it's too expensive and would like to go back on Lovastatin. Please advise?

## 2014-08-27 ENCOUNTER — Ambulatory Visit: Payer: Medicare PPO | Admitting: Internal Medicine

## 2014-08-29 ENCOUNTER — Encounter: Payer: Self-pay | Admitting: Family Medicine

## 2014-08-29 ENCOUNTER — Ambulatory Visit (INDEPENDENT_AMBULATORY_CARE_PROVIDER_SITE_OTHER): Payer: PPO | Admitting: Family Medicine

## 2014-08-29 VITALS — BP 126/74 | HR 64 | Temp 98.2°F | Wt 171.5 lb

## 2014-08-29 DIAGNOSIS — E559 Vitamin D deficiency, unspecified: Secondary | ICD-10-CM

## 2014-08-29 DIAGNOSIS — J0100 Acute maxillary sinusitis, unspecified: Secondary | ICD-10-CM

## 2014-08-29 DIAGNOSIS — J019 Acute sinusitis, unspecified: Secondary | ICD-10-CM | POA: Insufficient documentation

## 2014-08-29 MED ORDER — AMOXICILLIN-POT CLAVULANATE 875-125 MG PO TABS: 1.0000 | ORAL_TABLET | Freq: Two times a day (BID) | ORAL | Status: AC

## 2014-08-29 MED ORDER — VITAMIN D (ERGOCALCIFEROL) 1.25 MG (50000 UNIT) PO CAPS: 50000.0000 [IU] | ORAL_CAPSULE | ORAL | Status: DC

## 2014-08-29 NOTE — Progress Notes (Signed)
Pre visit review using our clinic review tool, if applicable. No additional management support is needed unless otherwise documented below in the visit note. 

## 2014-08-29 NOTE — Patient Instructions (Addendum)
You have a sinus infection, possibly viral. Give 1-2 days - and if improving, no need for antibiotics. Otherwise augmentin 10- d course is at your pharmacy. Use over the counter delsym or dimetapp (make sure no decongestant in it. Push fluids and plenty of rest. Nasal saline irrigation or neti pot to help drain sinuses. Let us know if fever >101.5, trouble opening/closing mouth, difficulty swallowing, or worsening - you may need to be seen again.

## 2014-08-29 NOTE — Assessment & Plan Note (Signed)
Of 1 wk duration. Discussed viral vs bacterial infection. Treat with supportive care as per instructions, delsym or dimetapp otc (intolerance to codeine and mucinex in past) Discussed reasons to fill abx - pt agrees with plan.

## 2014-08-29 NOTE — Assessment & Plan Note (Signed)
Pt has not been taking - discussed this. Will start weekly supplementation. refilled today

## 2014-08-29 NOTE — Progress Notes (Signed)
BP 126/74 mmHg  Pulse 64  Temp(Src) 98.2 F (36.8 C) (Oral)  Wt 171 lb 8 oz (77.792 kg)  SpO2 97%   CC: sinus congestion  Subjective:    Patient ID: Jill Snow, female    DOB: 1934/02/10, 81 y.o.   MRN: [id number]  HPI: Jill Snow is a 81 y.o. female presenting on 08/29/2014 for Sinusitis   1 wk h/o head congestion that seems to be progressing into chest. Cough worse at night time productive of thick white mucous. + maxillary and frontal sinus pressure. + ST. + sinus pressure headache. + myalgias.  No fevers/chills, ear or tooth pain, PNDrainage.  Hasn't tried any OTC remedies. No sick contacts at home. No smokers at home. No h/o asthma. H/o lone episode of paroxysmal atrial fibrillation on amiodarone.  Relevant past medical, surgical, family and social history reviewed and updated as indicated. Interim medical history since our last visit reviewed. Allergies and medications reviewed and updated. Current Outpatient Prescriptions on File Prior to Visit  Medication Sig  . ALPRAZolam (XANAX) 0.25 MG tablet TAKE ONE TABLET BY MOUTH AT BEDTIME AS NEEDED  . amiodarone (PACERONE) 200 MG tablet TAKE ONE TABLET EVERY DAY  . lisinopril (PRINIVIL,ZESTRIL) 10 MG tablet Take 10 mg by mouth daily.  . montelukast (SINGULAIR) 10 MG tablet Take 10 mg by mouth at bedtime.  . rosuvastatin (CRESTOR) 10 MG tablet Take 1 tablet (10 mg total) by mouth daily.  . fluticasone (FLONASE) 50 MCG/ACT nasal spray Place 2 sprays into the nose as needed.   . zolpidem (AMBIEN) 5 MG tablet TAKE ONE TABLET BY MOUTH AT BEDTIME AS NEEDED (Patient not taking: Reported on 08/29/2014)   No current facility-administered medications on file prior to visit.    Review of Systems Per HPI unless specifically indicated above     Objective:    BP 126/74 mmHg  Pulse 64  Temp(Src) 98.2 F (36.8 C) (Oral)  Wt 171 lb 8 oz (77.792 kg)  SpO2 97%  Wt Readings from Last 3 Encounters:  08/29/14 171 lb 8  oz (77.792 kg)  06/25/14 172 lb (78.019 kg)  05/15/14 175 lb (79.379 kg)    Physical Exam  Constitutional: She appears well-developed and well-nourished. No distress.  HENT:  Head: Normocephalic and atraumatic.  Right Ear: Hearing, tympanic membrane, external ear and ear canal normal.  Left Ear: Hearing, tympanic membrane, external ear and ear canal normal.  Nose: No mucosal edema (injected nares) or rhinorrhea. Right sinus exhibits maxillary sinus tenderness. Right sinus exhibits no frontal sinus tenderness. Left sinus exhibits maxillary sinus tenderness. Left sinus exhibits no frontal sinus tenderness.  Mouth/Throat: Uvula is midline, oropharynx is clear and moist and mucous membranes are normal. No oropharyngeal exudate, posterior oropharyngeal edema, posterior oropharyngeal erythema or tonsillar abscesses.  Eyes: Conjunctivae and EOM are normal. Pupils are equal, round, and reactive to light. No scleral icterus.  Neck: Normal range of motion. Neck supple.  Cardiovascular: Normal rate, regular rhythm, normal heart sounds and intact distal pulses.   No murmur heard. Pulmonary/Chest: Effort normal and breath sounds normal. No respiratory distress. She has no wheezes. She has no rales.  Lymphadenopathy:    She has no cervical adenopathy.  Skin: Skin is warm and dry. No rash noted.  Nursing note and vitals reviewed.      Assessment & Plan:   Problem List Items Addressed This Visit    Vitamin D deficiency    Pt has not been taking - discussed this.  Will start weekly supplementation. refilled today      Relevant Medications   Vitamin D, Ergocalciferol, (DRISDOL) 50000 UNITS CAPS capsule   Acute sinusitis - Primary    Of 1 wk duration. Discussed viral vs bacterial infection. Treat with supportive care as per instructions, delsym or dimetapp otc (intolerance to codeine and mucinex in past) Discussed reasons to fill abx - pt agrees with plan.      Relevant Medications    amoxicillin-clavulanate (AUGMENTIN) tablet 875-125 mg       Follow up plan: Return if symptoms worsen or fail to improve.

## 2014-09-27 ENCOUNTER — Ambulatory Visit: Payer: Medicare PPO | Admitting: Internal Medicine

## 2014-09-28 ENCOUNTER — Ambulatory Visit: Payer: Medicare PPO | Admitting: Internal Medicine

## 2014-10-01 ENCOUNTER — Ambulatory Visit: Payer: PPO | Admitting: Internal Medicine

## 2014-10-05 ENCOUNTER — Telehealth: Payer: Self-pay | Admitting: Internal Medicine

## 2014-10-05 ENCOUNTER — Other Ambulatory Visit: Payer: Self-pay | Admitting: Cardiovascular Disease

## 2014-10-05 ENCOUNTER — Other Ambulatory Visit: Payer: Self-pay | Admitting: Internal Medicine

## 2014-10-05 NOTE — Telephone Encounter (Signed)
Ok to phone in xanax 

## 2014-10-05 NOTE — Telephone Encounter (Signed)
Pt called stating her pharmacy has requested her xanax 4 times. I told pt I had not seen where they have requested it since 08/13/14. Pt is requesting refill for xanax.   [facility]

## 2014-10-05 NOTE — Telephone Encounter (Signed)
Last filled 08/13/14--please advise

## 2014-10-08 NOTE — Telephone Encounter (Signed)
Confirmed Rx called into pharmacy

## 2014-10-09 ENCOUNTER — Ambulatory Visit (INDEPENDENT_AMBULATORY_CARE_PROVIDER_SITE_OTHER): Payer: PPO | Admitting: Internal Medicine

## 2014-10-09 ENCOUNTER — Other Ambulatory Visit: Payer: Self-pay | Admitting: Internal Medicine

## 2014-10-09 ENCOUNTER — Encounter: Payer: Self-pay | Admitting: Internal Medicine

## 2014-10-09 VITALS — BP 118/62 | HR 67 | Temp 97.8°F | Wt 170.0 lb

## 2014-10-09 DIAGNOSIS — R748 Abnormal levels of other serum enzymes: Secondary | ICD-10-CM

## 2014-10-09 DIAGNOSIS — E785 Hyperlipidemia, unspecified: Secondary | ICD-10-CM

## 2014-10-09 LAB — COMPREHENSIVE METABOLIC PANEL
ALT: 26 U/L (ref 0–35)
AST: 35 U/L (ref 0–37)
Albumin: 4.2 g/dL (ref 3.5–5.2)
Alkaline Phosphatase: 62 U/L (ref 39–117)
BUN: 10 mg/dL (ref 6–23)
CALCIUM: 9.6 mg/dL (ref 8.4–10.5)
CHLORIDE: 105 meq/L (ref 96–112)
CO2: 31 meq/L (ref 19–32)
CREATININE: 0.79 mg/dL (ref 0.40–1.20)
GFR: 74.64 mL/min (ref >60.00)
GLUCOSE: 109 mg/dL — AB (ref 70–99)
Potassium: 3.7 mEq/L (ref 3.5–5.1)
Sodium: 141 mEq/L (ref 135–145)
Total Bilirubin: 0.6 mg/dL (ref 0.2–1.2)
Total Protein: 7 g/dL (ref 6.0–8.3)

## 2014-10-09 LAB — LDL CHOLESTEROL, DIRECT: Direct LDL: 173 mg/dL

## 2014-10-09 LAB — LIPID PANEL
Cholesterol: 240 mg/dL — ABNORMAL HIGH (ref 0–200)
HDL: 42.2 mg/dL (ref >39.00)
NonHDL: 197.8
TRIGLYCERIDES: 222 mg/dL — AB (ref 0.0–149.0)
Total CHOL/HDL Ratio: 6
VLDL: 44.4 mg/dL — ABNORMAL HIGH (ref 0.0–40.0)

## 2014-10-09 NOTE — Progress Notes (Signed)
Pre visit review using our clinic review tool, if applicable. No additional management support is needed unless otherwise documented below in the visit note. 

## 2014-10-09 NOTE — Progress Notes (Signed)
Subjective:    Patient ID: Jill Snow, female    DOB: 06/04/1934, 81 y.o.   MRN: [id number]  HPI  Pt presents to the clinic today for 3 month follow up of HLD. At her last visit, her cholesterol was elevated. Total cholesterol 273, HDL 43, LDL 195, Triglycerides 169. She was started on Crestor 10 mg at that time. She was also advised to consume a low fat diet. She reports the Crestor caused her to have muscle cramps so she stopped it. She reports that she is taking an old prescription but not sure if it is Lipitor or Lovastatin. She has been consuming a low fat diet. She does not exercise.  Review of Systems      Past Medical History  Diagnosis Date  . Atrial fibrillation     paroxysmal  . Hypertension   . Hypercholesterolemia   . Mitral insufficiency     Mild mitral insufficiency  . Dyspnea   . Seasonal allergies   . Sleep apnea   . RBBB (right bundle branch block)   . History of chicken pox   . Arrhythmia     Current Outpatient Prescriptions  Medication Sig Dispense Refill  . ALPRAZolam (XANAX) 0.25 MG tablet TAKE ONE TABLET BY MOUTH AT BEDTIME AS NEEDED 30 tablet 0  . amiodarone (PACERONE) 200 MG tablet TAKE ONE TABLET BY MOUTH EVERY DAY 30 tablet 3  . fluticasone (FLONASE) 50 MCG/ACT nasal spray Place 2 sprays into the nose as needed.     Marland Kitchen lisinopril (PRINIVIL,ZESTRIL) 10 MG tablet Take 10 mg by mouth daily.    . montelukast (SINGULAIR) 10 MG tablet Take 10 mg by mouth at bedtime.    . rosuvastatin (CRESTOR) 10 MG tablet Take 1 tablet (10 mg total) by mouth daily. 30 tablet 2  . Vitamin D, Ergocalciferol, (DRISDOL) 50000 UNITS CAPS capsule Take 1 capsule (50,000 Units total) by mouth every 7 (seven) days. 12 capsule 0  . zolpidem (AMBIEN) 5 MG tablet TAKE ONE TABLET BY MOUTH AT BEDTIME AS NEEDED 30 tablet 0   No current facility-administered medications for this visit.    Allergies  Allergen Reactions  . Codeine Anaphylaxis    "cant remember reaction"     Family History  Problem Relation Age of Onset  . Stroke Mother   . Heart attack Mother   . Arthritis Mother   . Hyperlipidemia Mother   . Heart disease Mother   . Diabetes Mother   . Diabetes Sister   . Cancer Daughter   . Heart disease Daughter     History   Social History  . Marital Status: Widowed    Spouse Name: N/A  . Number of Children: 2  . Years of Education: N/A   Occupational History  . REALTOR    Social History Main Topics  . Smoking status: Never Smoker   . Smokeless tobacco: Never Used  . Alcohol Use: No  . Drug Use: No  . Sexual Activity: No   Other Topics Concern  . Not on file   Social History Narrative     Constitutional: Denies fever, malaise, fatigue, headache or abrupt weight changes.  Respiratory: Denies difficulty breathing, shortness of breath, cough or sputum production.   Cardiovascular: Denies chest pain, chest tightness, palpitations or swelling in the hands or feet.  Neurological: Denies dizziness, difficulty with memory, difficulty with speech or problems with balance and coordination.   No other specific complaints in a complete review of systems (except  as listed in HPI above).  Objective:   Physical Exam   BP 118/62 mmHg  Pulse 67  Temp(Src) 97.8 F (36.6 C) (Oral)  Wt 170 lb (77.111 kg)  SpO2 98% Wt Readings from Last 3 Encounters:  10/09/14 170 lb (77.111 kg)  08/29/14 171 lb 8 oz (77.792 kg)  06/25/14 172 lb (78.019 kg)    General: Appears her stated age, well developed, well nourished in NAD.  Cardiovascular: Normal rate and rhythm. S1,S2 noted.  No murmur, rubs or gallops noted.  Pulmonary/Chest: Normal effort and positive vesicular breath sounds. No respiratory distress. No wheezes, rales or ronchi noted.  Neurological: Alert and oriented.   BMET    Component Value Date/Time   NA 141 06/25/2014 1045   K 4.1 06/25/2014 1045   CL 106 06/25/2014 1045   CO2 25 06/25/2014 1045   GLUCOSE 84 06/25/2014 1045    BUN 12 06/25/2014 1045   CREATININE 0.9 06/25/2014 1045   CALCIUM 9.8 06/25/2014 1045   GFRNONAA >60 03/06/2010 1411   GFRAA  03/06/2010 1411    >60        The eGFR has been calculated using the MDRD equation. This calculation has not been validated in all clinical situations. eGFR's persistently <60 mL/min signify possible Chronic Kidney Disease.    Lipid Panel     Component Value Date/Time   CHOL 273* 06/25/2014 1045   TRIG 169.0* 06/25/2014 1045   HDL 43.90 06/25/2014 1045   HDL 49 03/07/2014 0832   CHOLHDL 6 06/25/2014 1045   CHOLHDL 5.7* 03/07/2014 0832   VLDL 33.8 06/25/2014 1045   LDLCALC 195* 06/25/2014 1045   LDLCALC 202* 03/07/2014 0832    CBC    Component Value Date/Time   WBC 5.1 06/25/2014 1045   RBC 4.61 06/25/2014 1045   HGB 14.7 06/25/2014 1045   HCT 44.0 06/25/2014 1045   PLT 200.0 06/25/2014 1045   MCV 95.6 06/25/2014 1045   MCH 32.4 03/06/2010 1710   MCHC 33.3 06/25/2014 1045   RDW 12.6 06/25/2014 1045    Hgb A1C No results found for: HGBA1C      Assessment & Plan:

## 2014-10-09 NOTE — Patient Instructions (Signed)
Fat and Cholesterol Control Diet Fat and cholesterol levels in your blood and organs are influenced by your diet. High levels of fat and cholesterol may lead to diseases of the heart, small and large blood vessels, gallbladder, liver, and pancreas. CONTROLLING FAT AND CHOLESTEROL WITH DIET Although exercise and lifestyle factors are important, your diet is key. That is because certain foods are known to raise cholesterol and others to lower it. The goal is to balance foods for their effect on cholesterol and more importantly, to replace saturated and trans fat with other types of fat, such as monounsaturated fat, polyunsaturated fat, and omega-3 fatty acids. On average, a person should consume no more than 15 to 17 g of saturated fat daily. Saturated and trans fats are considered "bad" fats, and they will raise LDL cholesterol. Saturated fats are primarily found in animal products such as meats, butter, and cream. However, that does not mean you need to give up all your favorite foods. Today, there are good tasting, low-fat, low-cholesterol substitutes for most of the things you like to eat. Choose low-fat or nonfat alternatives. Choose round or loin cuts of red meat. These types of cuts are lowest in fat and cholesterol. Chicken (without the skin), fish, veal, and ground turkey breast are great choices. Eliminate fatty meats, such as hot dogs and salami. Even shellfish have little or no saturated fat. Have a 3 oz (85 g) portion when you eat lean meat, poultry, or fish. Trans fats are also called "partially hydrogenated oils." They are oils that have been scientifically manipulated so that they are solid at room temperature resulting in a longer shelf life and improved taste and texture of foods in which they are added. Trans fats are found in stick margarine, some tub margarines, cookies, crackers, and baked goods.  When baking and cooking, oils are a great substitute for butter. The monounsaturated oils are  especially beneficial since it is believed they lower LDL and raise HDL. The oils you should avoid entirely are saturated tropical oils, such as coconut and palm.  Remember to eat a lot from food groups that are naturally free of saturated and trans fat, including fish, fruit, vegetables, beans, grains (barley, rice, couscous, bulgur wheat), and pasta (without cream sauces).  IDENTIFYING FOODS THAT LOWER FAT AND CHOLESTEROL  Soluble fiber may lower your cholesterol. This type of fiber is found in fruits such as apples, vegetables such as broccoli, potatoes, and carrots, legumes such as beans, peas, and lentils, and grains such as barley. Foods fortified with plant sterols (phytosterol) may also lower cholesterol. You should eat at least 2 g per day of these foods for a cholesterol lowering effect.  Read package labels to identify low-saturated fats, trans fat free, and low-fat foods at the supermarket. Select cheeses that have only 2 to 3 g saturated fat per ounce. Use a heart-healthy tub margarine that is free of trans fats or partially hydrogenated oil. When buying baked goods (cookies, crackers), avoid partially hydrogenated oils. Breads and muffins should be made from whole grains (whole-wheat or whole oat flour, instead of "flour" or "enriched flour"). Buy non-creamy canned soups with reduced salt and no added fats.  FOOD PREPARATION TECHNIQUES  Never deep-fry. If you must fry, either stir-fry, which uses very little fat, or use non-stick cooking sprays. When possible, broil, bake, or roast meats, and steam vegetables. Instead of putting butter or margarine on vegetables, use lemon and herbs, applesauce, and cinnamon (for squash and sweet potatoes). Use nonfat   yogurt, salsa, and low-fat dressings for salads.  LOW-SATURATED FAT / LOW-FAT FOOD SUBSTITUTES Meats / Saturated Fat (g)  Avoid: Steak, marbled (3 oz/85 g) / 11 g  Choose: Steak, lean (3 oz/85 g) / 4 g  Avoid: Hamburger (3 oz/85 g) / 7  g  Choose: Hamburger, lean (3 oz/85 g) / 5 g  Avoid: Ham (3 oz/85 g) / 6 g  Choose: Ham, lean cut (3 oz/85 g) / 2.4 g  Avoid: Chicken, with skin, dark meat (3 oz/85 g) / 4 g  Choose: Chicken, skin removed, dark meat (3 oz/85 g) / 2 g  Avoid: Chicken, with skin, light meat (3 oz/85 g) / 2.5 g  Choose: Chicken, skin removed, light meat (3 oz/85 g) / 1 g Dairy / Saturated Fat (g)  Avoid: Whole milk (1 cup) / 5 g  Choose: Low-fat milk, 2% (1 cup) / 3 g  Choose: Low-fat milk, 1% (1 cup) / 1.5 g  Choose: Skim milk (1 cup) / 0.3 g  Avoid: Hard cheese (1 oz/28 g) / 6 g  Choose: Skim milk cheese (1 oz/28 g) / 2 to 3 g  Avoid: Cottage cheese, 4% fat (1 cup) / 6.5 g  Choose: Low-fat cottage cheese, 1% fat (1 cup) / 1.5 g  Avoid: Ice cream (1 cup) / 9 g  Choose: Sherbet (1 cup) / 2.5 g  Choose: Nonfat frozen yogurt (1 cup) / 0.3 g  Choose: Frozen fruit bar / trace  Avoid: Whipped cream (1 tbs) / 3.5 g  Choose: Nondairy whipped topping (1 tbs) / 1 g Condiments / Saturated Fat (g)  Avoid: Mayonnaise (1 tbs) / 2 g  Choose: Low-fat mayonnaise (1 tbs) / 1 g  Avoid: Butter (1 tbs) / 7 g  Choose: Extra light margarine (1 tbs) / 1 g  Avoid: Coconut oil (1 tbs) / 11.8 g  Choose: Olive oil (1 tbs) / 1.8 g  Choose: Corn oil (1 tbs) / 1.7 g  Choose: Safflower oil (1 tbs) / 1.2 g  Choose: Sunflower oil (1 tbs) / 1.4 g  Choose: Soybean oil (1 tbs) / 2.4 g  Choose: Canola oil (1 tbs) / 1 g Document Released: 08/03/2005 Document Revised: 11/28/2012 Document Reviewed: 11/01/2013 Exit[employer] Patient Information 2015 [employer]. This information is not intended to replace advice given to you by your health care provider. Make sure you discuss any questions you have with your health care provider.  

## 2014-10-09 NOTE — Assessment & Plan Note (Signed)
Will repeat Lipid profile and CMET today Advised her to call me when she gets home with the medication and dosage that she is taking If cholesterol remains elevated, consider Zocor Handout given on low fat diet

## 2014-10-10 NOTE — Addendum Note (Signed)
Addended by: Marchia Bond on: 10/10/2014 08:10 AM   Modules accepted: SmartSet

## 2014-10-11 MED ORDER — SIMVASTATIN 20 MG PO TABS: 20.0000 mg | ORAL_TABLET | Freq: Every day | ORAL | Status: DC

## 2014-10-11 NOTE — Addendum Note (Signed)
Addended by: Lurlean Nanny on: 10/11/2014 03:54 PM   Modules accepted: Orders

## 2014-10-15 ENCOUNTER — Ambulatory Visit: Payer: Self-pay | Admitting: Internal Medicine

## 2014-10-15 ENCOUNTER — Encounter: Payer: Self-pay | Admitting: Internal Medicine

## 2014-10-16 ENCOUNTER — Other Ambulatory Visit: Payer: Self-pay | Admitting: Radiology

## 2014-10-16 DIAGNOSIS — I6523 Occlusion and stenosis of bilateral carotid arteries: Secondary | ICD-10-CM

## 2014-11-23 ENCOUNTER — Encounter (INDEPENDENT_AMBULATORY_CARE_PROVIDER_SITE_OTHER): Payer: PPO

## 2014-11-23 DIAGNOSIS — I6523 Occlusion and stenosis of bilateral carotid arteries: Secondary | ICD-10-CM

## 2014-11-26 ENCOUNTER — Telehealth: Payer: Self-pay | Admitting: *Deleted

## 2014-11-26 NOTE — Telephone Encounter (Signed)
Dr. Ollen Gross stated for her sleep study,  she could have it done in [place] and his office in [place] could read it. She needs a order for the [facility] sleep lab. Thanks

## 2014-11-28 ENCOUNTER — Ambulatory Visit (INDEPENDENT_AMBULATORY_CARE_PROVIDER_SITE_OTHER): Payer: PPO | Admitting: Internal Medicine

## 2014-11-28 ENCOUNTER — Encounter: Payer: Self-pay | Admitting: Internal Medicine

## 2014-11-28 VITALS — BP 120/78 | HR 84 | Temp 98.5°F | Wt 165.0 lb

## 2014-11-28 DIAGNOSIS — J309 Allergic rhinitis, unspecified: Secondary | ICD-10-CM | POA: Diagnosis not present

## 2014-11-28 MED ORDER — MOMETASONE FUROATE 50 MCG/ACT NA SUSP: 2.0000 | Freq: Every day | NASAL | Status: DC

## 2014-11-28 MED ORDER — FEXOFENADINE HCL 180 MG PO TABS: 180.0000 mg | ORAL_TABLET | Freq: Every day | ORAL | Status: DC

## 2014-11-28 NOTE — Progress Notes (Signed)
Pre visit review using our clinic review tool, if applicable. No additional management support is needed unless otherwise documented below in the visit note. 

## 2014-11-28 NOTE — Progress Notes (Signed)
HPI  Pt presents to the clinic today with c/o facial pain and pressure, ear pain and sore throat. She reports this started 2 days ago. She is not blowing anything out of her nose. She does have a slight cough, minimally productive of yellow mucous. Her cough is worse first thing in the morning. She denies fever, chills or body aches. She did take some leftover Augmentin she had from 08/2014, but it made her sick on her stomach. She did not take anything this morning and the nausea has resolved. She does have a history of allergies. She takes Flonase and Singulair daily but does not feel like it is working. She has not had sick contacts.  Review of Systems    Past Medical History  Diagnosis Date  . Atrial fibrillation     paroxysmal  . Hypertension   . Hypercholesterolemia   . Mitral insufficiency     Mild mitral insufficiency  . Dyspnea   . Seasonal allergies   . Sleep apnea   . RBBB (right bundle branch block)   . History of chicken pox   . Arrhythmia     Family History  Problem Relation Age of Onset  . Stroke Mother   . Heart attack Mother   . Arthritis Mother   . Hyperlipidemia Mother   . Heart disease Mother   . Diabetes Mother   . Diabetes Sister   . Cancer Daughter   . Heart disease Daughter     History   Social History  . Marital Status: Widowed    Spouse Name: N/A  . Number of Children: 2  . Years of Education: N/A   Occupational History  . REALTOR    Social History Main Topics  . Smoking status: Never Smoker   . Smokeless tobacco: Never Used  . Alcohol Use: No  . Drug Use: No  . Sexual Activity: No   Other Topics Concern  . Not on file   Social History Narrative    Allergies  Allergen Reactions  . Codeine Anaphylaxis    "cant remember reaction"  . Augmentin [Amoxicillin-Pot Clavulanate] Nausea And Vomiting  . Crestor [Rosuvastatin] Other (See Comments)    Muscle pain/cramps      Constitutional:  Denies headache, fatigue, fever or abrupt  weight changes.  HEENT:  Positive facial pain, ear pain and sore throat. Denies eye redness, nasal congestion, ringing in the ears, wax buildup, runny nose or bloody nose. Respiratory: Positive cough. Denies difficulty breathing or shortness of breath.  Cardiovascular: Denies chest pain, chest tightness, palpitations or swelling in the hands or feet.   No other specific complaints in a complete review of systems (except as listed in HPI above).  Objective:  BP 120/78 mmHg  Pulse 84  Temp(Src) 98.5 F (36.9 C) (Oral)  Wt 165 lb (74.844 kg)  SpO2 98%   General: Appears her stated age, well developed, well nourished in NAD. HEENT: Head: normal shape and size, maxillary sinus tenderness noted; Eyes: sclera white, no icterus, conjunctiva pink; Ears: Tm's gray and intact, normal light reflex, mild cerumen impaction; Nose: mucosa boggy and moist, septum midline; Throat/Mouth: + PND. Teeth present, mucosa pink and moist, no exudate noted, no lesions or ulcerations noted.  Neck: No adenopathy noted.  Cardiovascular: Normal rate and rhythm. S1,S2 noted.  No murmur, rubs or gallops noted.  Pulmonary/Chest: Normal effort and positive vesicular breath sounds. No respiratory distress. No wheezes, rales or ronchi noted.      Assessment & Plan:  Allergic Rhintis  Can use a Neti Pot which can be purchased from your local drug store. Switch Flonase to Nasonex Flonase 2 sprays each nostril for 3 days and then as needed. Start Allegra in addition to your Singulair Watch for fever, chills, colored nasal mucous, cough or shortness of breath  RTC as needed or if symptoms persist.

## 2014-11-28 NOTE — Patient Instructions (Signed)

## 2014-11-29 ENCOUNTER — Other Ambulatory Visit: Payer: Self-pay | Admitting: Internal Medicine

## 2014-11-30 ENCOUNTER — Other Ambulatory Visit: Payer: Self-pay | Admitting: Internal Medicine

## 2014-12-03 NOTE — Telephone Encounter (Signed)
Ok to phone in xanax. I did not see CSA or UDS in system

## 2014-12-03 NOTE — Telephone Encounter (Signed)
Last filled 10/05/2014--please advise

## 2014-12-04 NOTE — Telephone Encounter (Signed)
Rx called in to pharmacy. 

## 2014-12-07 NOTE — Telephone Encounter (Signed)
Spoke to pt and she is aware that she needs to come in the complete the CSA before next refill

## 2014-12-07 NOTE — Discharge Summary (Signed)
PATIENT NAME:  MONETTA, LICK MR#:  [id number] DATE OF BIRTH:  August 06, 1934  ADDENDUM   Please take a look at the discharge summary done by me on September 30 for the full details of the hospital course.   The patient was not discharged on September 30 as she had a sundowning event where she became somewhat confused and eloped from the medical telemetry floor. The patient was found in her car. She was brought back to the hospital and remains stable overnight. Did not have any further mental status change or any further sundowning. I suspect her confusion and mental status change was probably polypharmacy related from the Phenergan and the Ambien she got the night before. At this point, she is clinically stable to be discharged. She remains in normal sinus rhythm. There is no longer A. flutter or AFib after doing electrical cardioversion 2 days ago. Her daughter is in agreement with this plan.   Please add Lasix 20 mg, Monday, Wednesday, Friday to the discharge medications.   CODE STATUS: FULL CODE.   TIME SPENT: 30 minutes.    ____________________________ [name]. [name], MD vjs:[id number] D: 05/17/2013 15:28:05 ET T: 05/17/2013 17:59:39 ET JOB#: [id number]  cc: [name]. [name], MD, <Dictator> Henreitta Leber MD ELECTRONICALLY SIGNED 05/24/2013 19:14

## 2014-12-07 NOTE — H&P (Signed)
PATIENT NAME:  Jill Snow, Jill Snow MR#:  [id number] DATE OF BIRTH:  09-21-1933  DATE OF ADMISSION:  05/14/2013  ATTENDING PHYSICIAN:  Dr. Charlesetta Ivory.  PRIMARY CARE PHYSICIAN: Dr Clayborn Bigness, at [facility].  PRIMARY CARDIOLOGIST: Dr. Martinique, at [facility] in [place].   CHIEF COMPLAINT: Palpitations, shortness of breath, chest pain.   HISTORY OF PRESENT ILLNESS: This is a 81 year old female with a significant past medical history of paroxysmal a-fib in the past, reports having an episode last year. No recurrence after that. Never on anticoagulation, history of hyperlipidemia but not on any statins, history of obstructive sleep apnea on CPAP, presents with the above-mentioned complaints. Reports that this evening she had an episode of sudden shortness of breath and palpitations and chest pain.   The patient drove herself here to the ED. The patient was found to be in a-flutter with RVR, heart rate in the 150s. The patient was given IV Cardizem x 2 which slowed her heart rate, as well dropped her blood pressure to the 80s and the 37S systolic. The patient was bolused where she responded to the fluid bolus clinically, blood pressure in the high 90s. By reviewing the patient's old records, she is known to have a history of atrial fibrillation in the past, but seems to be in normal sinus rhythm since then.   The patient reports she has seen her primary care physician recently last Thursday as she has been complaining of some congestion over the last couple of weeks where she has been started on doxycycline, but she has not been feeling well, where she was told by her PCP her heart rate and blood pressure was up, where she was started on p.o. metoprolol. Prior to that she had no history of high blood pressure, and she is not clear if she is was started on this medicine because of her heart rate or because of blood pressure at this point.   The patient's EKG is showing a right bundle  branch block, which appears to be old. The patient also complains of lower extremity edema for the last few days and shortness of breath, mainly exertional. Denies any orthopnea. Has no JVD.   The patient was given subcu Lovenox treatment dose in the ED out of concern of acute coronary syndrome due to her chest pain. As well, she received aspirin.   Hospitalist service was requested to admit the patient for further workup.   PAST MEDICAL HISTORY: Obstructive sleep apnea, on CPAP.   PAST MEDICAL HISTORY: 1.  Paroxysmal a-fibrillation.  2.  Obstructive sleep apnea.  3.  Hyperlipidemia.   PAST SURGICAL HISTORY: 1.  Cholecystectomy.  2.  Tubal ligation.  3.  Tonsillectomy.  4.  Appendectomy.   SOCIAL HISTORY: The patient has no history of smoking, illicit drug use or alcohol use. She is still actively working as a Cabin crew.   FAMILY HISTORY: Denies any history of coronary artery disease at a young age. Family history is significant for heart disease in her father in his 64s.   ALLERGIES: CODEINE.   HOME MEDICATIONS: 1.  Doxycycline 100 mg oral 2 times a day.  2.  Alprazolam 0.25 mg oral at bedtime.  3.  Zolpidem 5 mg oral at bedtime.  4.  Metoprolol succinate extended-release 50 mg oral daily, started this last week.   REVIEW OF SYSTEMS: CONSTITUTIONAL: Denies fever, chills. Complains of fatigue, weakness. Denies weight gain, weight loss.  EYES: Denies blurry vision, double vision, inflammation, glaucoma.  ENT:  Denies tinnitus, ear pain, hearing loss, epistaxis. Complains of some congestion and nasal discharge.  RESPIRATORY: Denies any COPD, hemoptysis, wheezing. Had complaints of some cough with nonproductive sputum.  CARDIOVASCULAR: Complaints of palpitations. CHF possible. Chest pain. She is unclear if this is chest pain or severe palpitations. Denies any syncope. Complains of edema.  GASTROINTESTINAL: Denies nausea, vomiting, diarrhea, abdominal pain, hematemesis, jaundice, rectal  bleed.  GENITOURINARY: Denies dysuria, hematuria, renal colic.  ENDOCRINE: Denies polyuria or polydipsia, heat or cold intolerance.  HEMATOLOGY: Denies anemia, easy bruising or bleeding diathesis.  INTEGUMENTARY: Denies acne, rash or skin lesions.  MUSCULOSKELETAL: Denies any gout, redness, arthritis, cramps.  NEUROLOGIC: Denies CVA, TIA, dementia, headache, vertigo tremor.  PSYCHIATRIC: Denies anxiety, bipolar disorder, schizophrenia, substance or alcohol abuse.   PHYSICAL EXAMINATION: VITAL SIGNS: Temperature 97.3, pulse 96, respiratory rate 18, blood pressure 107/81, saturating 96% on 2 liters.  GENERAL: Well-nourished female who looks comfortable, in no apparent distress.  HEENT: Head atraumatic, normocephalic. Pupils equal and reactive to light. Pink conjunctivae. Anicteric sclerae. Moist oral mucosa.  NECK: Supple. No thyromegaly. No JVD.  CHEST: Good air entry bilaterally. No wheezing, rales, rhonchi.  CARDIOVASCULAR: S1, S2 heard. No rubs, murmur or gallops. Irregularly irregular rhythm.  ABDOMEN: Soft, nontender, nondistended. Bowel sounds present.  EXTREMITIES: +1 edema bilaterally. No clubbing. No cyanosis. Has trace dorsalis pedis pulses +2 bilaterally.  PSYCHIATRIC: Appropriate affect. Awake, alert x 3. Intact judgment and insight.  NEUROLOGIC: Cranial nerves grossly intact, 5/5. No focal deficit.  LYMPHATICS: No cervical or supraclavicular lymphadenopathy.  MUSCULOSKELETAL: No joint effusion or erythema.   PERTINENT LABS: Glucose 100, BNP 2418, BUN 14, creatinine 1.04, sodium 140, potassium 3.5, chloride 107. Magnesium 1.6, TSH 2.24, troponin less than 0.02. White blood cells 5.7, hemoglobin 15.4, hematocrit 46.2, platelets 183.   EKG showing a-flutter at 150 beats per minute.   ASSESSMENT AND PLAN: 1.  Atrial fibrillation/atrial flutter with rapid ventricular rate: Currently heart rate is better-controlled with IV Cardizem x 2 in the Emergency Department. Will resume her  back on metoprolol, extended-release, 50 mg. Will put parameters for blood pressure and heart rate, if her blood pressure allows and if it does not will load her with digoxin. Will consult cardiology. The patient is seen by Dr. Martinique from [facility], so will consult [facility]. Will continue to cycle her cardiac enzymes, monitor her on telemetry. Will continue with  gentle hydration, will correct her magnesium and replace her potassium.  2.  Hypomagnesemia: Will replace with 2 grams of magnesium  sulfate.  3.  Chest pain: This appears to be most likely related to her atrial fibrillation with rapid ventricular response. The patient already received aspirin and Lovenox. Will continue to cycle her cardiac enzymes.  4.  Congestive heart failure: Unclear if this is congestive heart or if this is related to her atrial fibrillation with rapid ventricular response. She is having a mildly elevated BNP and lower extremity edema, but no other signs of congestive heart failure. Will check 2-D echo and will hold on diuresis due to his soft blood pressure.  5.  Obstructive sleep apnea: Continue on CPAP.  6.  Hyperlipidemia: Will check lipid panel. If elevated, will start her on statins.  7. Deep vein thrombosis prophylaxis: The patient received one dose of subcutaneous Lovenox in the ED. Will await cardiology evaluation prior to deciding on continuing treatment, versus plus  prophylaxis dose.   CODE STATUS: THE PATIENT IS FULL CODE.   Total time spent on admission  and patient care: Fifty minutes    ____________________________ Albertine Madalene, MD dse:dm D: 05/14/2013 06:57:41 ET T: 05/14/2013 07:30:02 ET JOB#: [id number]  cc: Albertine Myda, MD, <Dictator> Alston Berrie Graciela Husbands MD ELECTRONICALLY SIGNED 05/16/2013 1:01

## 2014-12-07 NOTE — Discharge Summary (Signed)
PATIENT NAME:  Jill Snow, Jill Snow MR#:  [id number] DATE OF BIRTH:  1934-01-21  DATE OF ADMISSION:  05/14/2013 DATE OF DISCHARGE:  05/16/2013  For a detailed note, please take a look at the history and physical done on admission by Dr. Waldron Labs.   DISCHARGE DIAGNOSIS: 1.  Atrial fibrillation/flutter status post electrical cardioversion.  2.  Congestive heart failure, likely acute systolic dysfunction, much improved.  3.  Hypertension.  4.  Anxiety.   DIET: The patient is being discharged on a low-sodium diet.   ACTIVITY: As tolerated.   DISCHARGE FOLLOWUP: Is with Dr. Ida Rogue and Dr. Clayborn Bigness in the next 1 to 2 weeks.   DISCHARGE MEDICATIONS: 1.  Toprol 50 mg daily. 2.  Ambien 5 mg at bedtime. 3.  Xanax 0.25 mg at bedtime.  4.  Apixaban 5 mg b.i.d. 5.  Amiodarone 200 mg b.i.d. 6.  Digoxin 125 mcg daily.   CONSULTANTS DURING HOSPITAL COURSE: Dr. Kathlyn Sacramento and Dr. Ida Rogue from cardiology.   PERTINENT STUDIES DONE DURING HOSPITAL COURSE: A chest x-ray done on admission showing findings consistent with congestive heart failure with mild pulmonary interstitial edema. An ultrasound of her abdomen showing mild gallbladder wall thickening without evidence of stones or pericolic cystic fluid. No sonographic Murphy sign. Kidneys are normal. Bilateral pleural effusions. Liver and spleen and the visualized portion of the pancreas appear normal.   A transesophageal echocardiogram done on September 30th showing rhythm to be in atrial flutter, EF of 45% to 50%, mild decreased global LV systolic function, moderate mitral valve regurgitation, mildly dilated right atrium, mildly dilated left atrium. No thrombus in the left atrium or left atrial appendage. Intact interatrial septum. Saline contrast bubble study negative for PFO or atrial septal defect.   HOSPITAL COURSE: This is a 81 year old female with medical problems as mentioned above, presented to the hospital on 05/14/2013  secondary to palpitations and chest pain and shortness of breath and noted to be in rapid A-fib/flutter.  1.  Atrial fibrillation/flutter. This was likely the cause of the patient's palpitations. The patient apparently has a history of atrial fibrillation but had converted to a sinus rhythm spontaneously. This is a few years back. She had not had a followup in over a few months and had been having palpitations on and off for 2 weeks and therefore presented to the hospital and was noted to be in rapid A flutter. The patient received pulse doses of IV Cardizem with some improvement in her heart rate and she was admitted to the telemetry floor, started on oral Cardizem, along with her Toprol. A cardiology consult was obtained. The patient remained somewhat on the hypotensive side. Therefore, it was difficult to titrate her rate controlling meds at which point cardiology planned on doing electrical cardioversion. The patient underwent electrical cardioversion on the morning of September 30th and had converted to a normal sinus rhythm then. Presently, she remains in normal sinus rhythm and hemodynamically stable. She is currently being discharged on some Toprol, amiodarone and digoxin for rate control of her atrial flutter. She was also started on long-term anticoagulation with Eliquis and she will resume that upon discharge presently. The patient will have follow-up with Dr. Rockey Situ from cardiology as an outpatient.  2.  Congestive heart failure. This was likely the cause of the patient's shortness of breath. This was likely acute diastolic dysfunction secondary to her uncontrolled afib/flutter. Her echo showed EF of 40% to 45%. The patient responded to some p.r.n. Lasix doses and  is about 2 to 3 liters negative since admission and is clinically feeling a lot better.  3.  Hypertension. The patient has remained hemodynamically stable. She will continue her Toprol. Her blood pressure has been on the low side. 4.   Hypomagnesemia. This has since then been supplemented and resolved.  5.  Anxiety. The patient was maintained on her Xanax. She will resume that.  6.  Right upper quadrant pain with some nausea. The exact etiology of this is unclear. There was some concern of some biliary pathology, although the patient's LFTs are normal and her right upper quadrant ultrasound also showed no evidence of acute abnormalities. She is currently pain free and has had no further nausea or vomiting in the past 48 hours and therefore is being discharged home.   CODE STATUS: The patient is a FULL CODE.   TIME SPENT WITH DISCHARGE: 40 minutes.  ____________________________ Jill Snow. Jill Carmine, MD vjs:sb D: 05/16/2013 14:13:00 ET T: 05/16/2013 14:42:46 ET JOB#: [id number]  cc: Jill Snow. Jill Carmine, MD, <Dictator> Jill Merritts, MD Jill Guise, MD Jill Leber MD ELECTRONICALLY SIGNED 05/24/2013 19:14

## 2014-12-07 NOTE — Consult Note (Signed)
General Aspect Jill Snow has a hx of  paroxysmal atrial fibrillation,  occurrence  in July of 2011 and two years ago per the patient, history of hypertension, OSA, hypercholesterolemia on CPAP (nasal pillow) and oxygen therapy at night, work related anxiety, presenting with tachycardia, palpitations, worsening SOB. Cardiology was consulted for atrial fibrillation.  She reports having chest congestion for the past few weeks, cough white sputum. Sx worse over the past week. She saw her PMD on Thursday, 3 1/2 days ago, started on a new script for "her heart". Sx got worse.   Reports that this evening she had an episode of sudden shortness of breath and palpitations and chest pain.  The patient drove herself here to the ED.   In the ER, The patient was found to be in a-flutter with RVR, heart rate in the 150s. The patient was given IV Cardizem x 2 which slowed her heart rate, as well dropped her blood pressure to the 80s and the 42A systolic. The patient was bolused where she responded to the fluid bolus clinically, blood pressure in the high 90s.   Overnight, rates in the 100 range  Previous Echocardiogram in July of 2011 which was normal except for mild mitral insufficiency.   Present Illness . PAST MEDICAL HISTORY: Obstructive sleep apnea, on CPAP.   PAST MEDICAL HISTORY: 1.  Paroxysmal a-fibrillation.  2.  Obstructive sleep apnea.  3.  Hyperlipidemia.   PAST SURGICAL HISTORY: 1.  Cholecystectomy.  2.  Tubal ligation.  3.  Tonsillectomy.  4.  Appendectomy.   SOCIAL HISTORY: The patient has no history of smoking, illicit drug use or alcohol use. She is still actively working as a Cabin crew.   FAMILY HISTORY: Denies any history of coronary artery disease at a young age. Family history is significant for heart disease in her father in his 48s.   ALLERGIES: CODEINE.   HOME MEDICATIONS: 1.  Doxycycline 100 mg oral 2 times a day.  2.  Alprazolam 0.25 mg oral at bedtime.  3.  Zolpidem  5 mg oral at bedtime.  4.  Metoprolol succinate extended-release 50 mg oral daily, started this last week.   Physical Exam:  GEN well developed, well nourished, no acute distress   HEENT moist oral mucosa   NECK supple   RESP normal resp effort  clear BS   CARD Irregular rate and rhythm  Tachycardic  No murmur   ABD denies tenderness   EXTR negative edema   NEURO cranial nerves intact, motor/sensory function intact   PSYCH alert, A+O to time, place, person, good insight   Review of Systems:  Subjective/Chief Complaint SOB, palpitations, tachycardia   General: Fatigue   ENT: No Complaints   Eyes: No Complaints   Neck: No Complaints   Respiratory: Frequent cough  Short of breath   Cardiovascular: Dyspnea  Edema   Gastrointestinal: No Complaints   Genitourinary: No Complaints   Vascular: No Complaints   Musculoskeletal: No Complaints   Neurologic: No Complaints   Hematologic: No Complaints   Endocrine: No Complaints   Psychiatric: No Complaints   Review of Systems: All other systems were reviewed and found to be negative   Medications/Allergies Reviewed Medications/Allergies reviewed   Lab Results:  Thyroid:  28-Sep-14 02:21   Thyroid Stimulating Hormone 2.24 (0.45-4.50 (International Unit)  ----------------------- Pregnant patients have  different reference  ranges for TSH:  - - - - - - - - - -  Pregnant, first trimetser:  0.36 - 2.50 uIU/mL)  Routine Chem:  28-Sep-14 02:21   B-Type Natriuretic Peptide [place] [place])  2418 (Result(s) reported on 14 May 2013 at 06:15AM.)  Magnesium, Serum  1.6 (1.8-2.4 THERAPEUTIC RANGE: 4-7 mg/dL TOXIC: > 10 mg/dL  -----------------------)  Glucose, Serum  100  BUN 14  Creatinine (comp) 1.04  Sodium, Serum 140  Potassium, Serum 3.5  Chloride, Serum 107  CO2, Serum 30  Calcium (Total), Serum 9.1  Anion Gap  3  Osmolality (calc) 280  eGFR (African American) >60  eGFR (Non-African American)  52 (eGFR values  <24m/min/1.73 m2 may be an indication of chronic kidney disease (CKD). Calculated eGFR is useful in patients with stable renal function. The eGFR calculation will not be reliable in acutely ill patients when serum creatinine is changing rapidly. It is not useful in  patients on dialysis. The eGFR calculation may not be applicable to patients at the low and high extremes of body sizes, pregnant women, and vegetarians.)  Cardiac:  28-Sep-14 02:21   Troponin I < 0.02 (0.00-0.05 0.05 ng/mL or less: NEGATIVE  Repeat testing in 3-6 hrs  if clinically indicated. >0.05 ng/mL: POTENTIAL  MYOCARDIAL INJURY. Repeat  testing in 3-6 hrs if  clinically indicated. NOTE: An increase or decrease  of 30% or more on serial  testing suggests a  clinically important change)  CK, Total 71  CPK-MB, Serum 1.9 (Result(s) reported on 14 May 2013 at 02:59AM.)  Routine Hem:  28-Sep-14 02:21   WBC (CBC) 5.7  RBC (CBC) 4.99  Hemoglobin (CBC) 15.4  Hematocrit (CBC) 46.2  Platelet Count (CBC) 183 (Result(s) reported on 14 May 2013 at 02:44AM.)  MCV 93  MCH 30.8  MCHC 33.2  RDW 13.7   EKG:  Interpretation EKG shows atrial flutter with ventricular rate 150 bpm, nonspecific ST ABN   Radiology Results: XRay:    28-Sep-14 02:50, Chest Portable Single View  Chest Portable Single View   REASON FOR EXAM:    Chest Pain, shortness of breath  COMMENTS:       PROCEDURE: DXR - DXR PORTABLE CHEST SINGLE VIEW  - May 14 2013  2:50AM     RESULT: Comparison is made to study of May 04, 2012.    The cardiac silhouette is enlarged. The pulmonary vascularity is   engorged. The interstitial markings are increased. There is no alveolar   infiltrate nor significant pleural effusion. The observed portions of the   bony thorax appear normal.    IMPRESSION:  The findings are consistent with congestive heart failure   with mild pulmonary interstitial edema. The appearance of the chest has   deteriorated since  the previous study.   Dictation Site: 1        Verified By: DAVID A. JMartinique M.D., MD    Codeine: Unknown  Vital Signs/Nurse's Notes: **Vital Signs.:   28-Sep-14 07:23  Vital Signs Type Routine  Temperature Temperature (F) 97.4  Celsius 36.3  Temperature Source oral  Pulse Pulse 122  Pulse source if not from Vital Sign Device per Telemetry Clerk  Systolic BP Systolic BP 1478 Diastolic BP (mmHg) Diastolic BP (mmHg) 61  Mean BP 86  Pulse Ox % Pulse Ox % 96  Pulse Ox Activity Level  At rest  Oxygen Delivery 2L    Impression Mrs. HBuddehas a hx of  paroxysmal atrial fibrillation,  occurrence  in July of 2011 and two years ago per the patient, history of hypertension, OSA, hypercholesterolemia on CPAP (nasal pillow) and oxygen therapy at night, work related  anxiety, presenting with tachycardia, palpitations, worsening SOB. Cardiology was consulted for atrial fibrillation.  1) Atrial flutter Rate poorly controlled this AM, >130 bpm with walking Uncertain how long she has been in atial flutter, will try to avoid using amiodarone (as this may convert her) She has certainly been in atrial flutter for more than 48 hours --She is on metoprolol, will add diltiazem and digoxin Start eliquis 5 mg po BID Echo pending  2) SOB suspect she has acute diastolic CHF, from arrhythmia/poorly controlled atrial flutter Will start lasix BID  3) HTN Will continue metoprolol Add diltiazem  4) OSA:  on nasal pillow, oxygen   Electronic Signatures: Ida Rogue (MD)  (Signed 28-Sep-14 10:54)  Authored: General Aspect/Present Illness, History and Physical Exam, Review of System, Past Medical History, Home Medications, Labs, EKG , Radiology, Allergies, Vital Signs/Nurse's Notes, Impression/Plan   Last Updated: 28-Sep-14 10:54 by Ida Rogue (MD)

## 2014-12-14 ENCOUNTER — Encounter: Payer: Self-pay | Admitting: Internal Medicine

## 2014-12-28 ENCOUNTER — Institutional Professional Consult (permissible substitution): Payer: PPO | Admitting: Pulmonary Disease

## 2015-01-04 ENCOUNTER — Ambulatory Visit (INDEPENDENT_AMBULATORY_CARE_PROVIDER_SITE_OTHER): Payer: PPO | Admitting: Cardiovascular Disease

## 2015-01-04 ENCOUNTER — Encounter: Payer: Self-pay | Admitting: Cardiovascular Disease

## 2015-01-04 VITALS — BP 110/62 | HR 62 | Ht 66.0 in | Wt 166.5 lb

## 2015-01-04 DIAGNOSIS — J0100 Acute maxillary sinusitis, unspecified: Secondary | ICD-10-CM

## 2015-01-04 DIAGNOSIS — E559 Vitamin D deficiency, unspecified: Secondary | ICD-10-CM | POA: Diagnosis not present

## 2015-01-04 DIAGNOSIS — E7800 Pure hypercholesterolemia, unspecified: Secondary | ICD-10-CM

## 2015-01-04 DIAGNOSIS — G4733 Obstructive sleep apnea (adult) (pediatric): Secondary | ICD-10-CM

## 2015-01-04 DIAGNOSIS — Z9989 Dependence on other enabling machines and devices: Secondary | ICD-10-CM

## 2015-01-04 DIAGNOSIS — I10 Essential (primary) hypertension: Secondary | ICD-10-CM

## 2015-01-04 DIAGNOSIS — I480 Paroxysmal atrial fibrillation: Secondary | ICD-10-CM

## 2015-01-04 MED ORDER — VITAMIN D (ERGOCALCIFEROL) 1.25 MG (50000 UNIT) PO CAPS
50000.0000 [IU] | ORAL_CAPSULE | ORAL | Status: DC
Start: 2015-01-04 — End: 2016-07-23

## 2015-01-04 MED ORDER — ROSUVASTATIN CALCIUM 10 MG PO TABS: 10.0000 mg | ORAL_TABLET | Freq: Every day | ORAL | Status: DC

## 2015-01-04 NOTE — Patient Instructions (Signed)
You are doing well.  Please start 1/2 pill of crestor for a few weeks Increase up to a full pill if tolerated. Labs fasting in 3 months  Check out [facility]" for CPAP machine  Restart Vit D  Please call us if you have new issues that need to be addressed before your next appt.  Your physician wants you to follow-up in: 12 months.  You will receive a reminder letter in the mail two months in advance. If you don't receive a letter, please call our office to schedule the follow-up appointment.

## 2015-01-04 NOTE — Assessment & Plan Note (Signed)
She reports having problems with simvastatin in the past. After long discussion, she could take either Lipitor or Crestor She prefers to try Crestor 5 mg with titration up to 10 mg in several weeks' time

## 2015-01-04 NOTE — Assessment & Plan Note (Signed)
She reports that her machine is broken. Suggested she go to the [facility] across the street, [facility], as she had her sleep study done through [facility]. They should be up to help her with the machine. They might need a prescription

## 2015-01-04 NOTE — Assessment & Plan Note (Signed)
Continued on the same medications. She denies any palpitations concerning for arrhythmia

## 2015-01-04 NOTE — Assessment & Plan Note (Signed)
Blood pressure is well controlled on today's visit. No changes made to the medications. 

## 2015-01-04 NOTE — Assessment & Plan Note (Signed)
Symptoms are slowly improving.

## 2015-01-04 NOTE — Progress Notes (Signed)
Patient ID: Jill Snow, female    DOB: 1934-02-25, 81 y.o.   MRN: [id number]  HPI Comments: Jill Snow is a pleasant 81 year old woman with a history of paroxysmal atrial fibrillation, episode in July 2011 , also history of hypertension and hypercholesterolemia, diagnosed with with sleep apnea, uses nasal pillow CPAP and oxygen therapy at night. She presents for follow-up of her atrial fibrillation  In follow-up today, she reports that she is doing relatively well. She's had some sinus issues for the past 2 months. Otherwise denies any significant shortness of breath or chest pain She denies any significant palpitations concerning for atrial fibrillation. She reports that she is unable to tolerate simvastatin. She was able to tolerate Lipitor in the past Previous cholesterol numbers she was not taking cholesterol medication per the patient Currently not using CPAP. Machine is not working.  EKG on today's visit shows normal sinus rhythm with rate 62 bpm, right bundle branch block  Other past medical history hospitalization 05/14/2013 for palpitations, shortness of breath with tachycardia. She had upper respiratory infection and was found to be in atrial flutter. She was given IV Cardizem with minimal improvement of her heart rate. She was hypotensive and required several days in the hospital for management. She ended up needing TEE and cardioversion as rate was difficult to control despite metoprolol, aspirin channel blocker and digoxin. She was started on anticoagulation at that time.   TEE dated 05/16/2013 showed ejection fraction 45-50%, mildly dilated left and right atrium, moderate mitral valve regurgitation noted Chest x-ray in the hospital did show small bilateral pleural effusions    Allergies  Allergen Reactions  . Codeine Anaphylaxis    "cant remember reaction"  . Augmentin [Amoxicillin-Pot Clavulanate] Nausea And Vomiting  . Crestor [Rosuvastatin] Other (See Comments)     Muscle pain/cramps     Current Outpatient Prescriptions on File Prior to Visit  Medication Sig Dispense Refill  . ALPRAZolam (XANAX) 0.25 MG tablet TAKE ONE TABLET BY MOUTH AT BEDTIME AS NEEDED 30 tablet 0  . amiodarone (PACERONE) 200 MG tablet TAKE ONE TABLET BY MOUTH EVERY DAY 30 tablet 3  . fexofenadine (ALLEGRA) 180 MG tablet Take 1 tablet (180 mg total) by mouth daily. 30 tablet 2  . fluticasone (FLONASE) 50 MCG/ACT nasal spray TAKE 2 SPRAYS IN EACH NOSTRIL EVERY DAY 16 g 5  . lisinopril (PRINIVIL,ZESTRIL) 10 MG tablet Take 10 mg by mouth daily.    . mometasone (NASONEX) 50 MCG/ACT nasal spray Place 2 sprays into the nose daily. 17 g 12  . montelukast (SINGULAIR) 10 MG tablet Take 10 mg by mouth at bedtime.    Marland Kitchen zolpidem (AMBIEN) 5 MG tablet TAKE ONE TABLET BY MOUTH AT BEDTIME AS NEEDED 30 tablet 0   No current facility-administered medications on file prior to visit.    Past Medical History  Diagnosis Date  . Atrial fibrillation     paroxysmal  . Hypertension   . Hypercholesterolemia   . Mitral insufficiency     Mild mitral insufficiency  . Dyspnea   . Seasonal allergies   . Sleep apnea   . RBBB (right bundle branch block)   . History of chicken pox   . Arrhythmia     Past Surgical History  Procedure Laterality Date  . Abdominal hysterectomy    . Tummy tuck    . Tonsillectomy and adenoidectomy  1947    Social History  reports that she has never smoked. She has never used smokeless tobacco. She  reports that she does not drink alcohol or use illicit drugs.  Family History family history includes Arthritis in her mother; Cancer in her daughter; Diabetes in her mother and sister; Heart attack in her mother; Heart disease in her daughter and mother; Hyperlipidemia in her mother; Stroke in her mother.   Review of Systems  Constitutional: Negative.   Respiratory: Negative.   Cardiovascular: Negative.   Gastrointestinal: Negative.   Musculoskeletal: Negative.    Skin: Negative.   Neurological: Negative.   Hematological: Negative.   Psychiatric/Behavioral: Negative.   All other systems reviewed and are negative.   BP 110/62 mmHg  Pulse 62  Ht 5\' 6"  (1.676 m)  Wt 166 lb 8 oz (75.524 kg)  BMI 26.89 kg/m2  Physical Exam  Constitutional: She is oriented to person, place, and time. She appears well-developed and well-nourished.  HENT:  Head: Normocephalic.  Nose: Nose normal.  Mouth/Throat: Oropharynx is clear and moist.  Eyes: Conjunctivae are normal. Pupils are equal, round, and reactive to light.  Neck: Normal range of motion. Neck supple. No JVD present. Carotid bruit is present.  Cardiovascular: Normal rate, regular rhythm, S1 normal, S2 normal, normal heart sounds and intact distal pulses.  Exam reveals no gallop and no friction rub.   No murmur heard. Pulmonary/Chest: Effort normal and breath sounds normal. No respiratory distress. She has no wheezes. She has no rales. She exhibits no tenderness.  Abdominal: Soft. Bowel sounds are normal. She exhibits no distension. There is no tenderness.  Musculoskeletal: Normal range of motion. She exhibits no edema or tenderness.  Lymphadenopathy:    She has no cervical adenopathy.  Neurological: She is alert and oriented to person, place, and time. Coordination normal.  Skin: Skin is warm and dry. No rash noted. No erythema.  Psychiatric: She has a normal mood and affect. Her behavior is normal. Judgment and thought content normal.    Assessment and Plan  Nursing note and vitals reviewed.

## 2015-01-07 ENCOUNTER — Ambulatory Visit (INDEPENDENT_AMBULATORY_CARE_PROVIDER_SITE_OTHER): Payer: PPO | Admitting: Internal Medicine

## 2015-01-07 ENCOUNTER — Telehealth: Payer: Self-pay

## 2015-01-07 ENCOUNTER — Encounter: Payer: Self-pay | Admitting: Internal Medicine

## 2015-01-07 VITALS — BP 120/68 | HR 74 | Temp 98.7°F | Wt 166.0 lb

## 2015-01-07 DIAGNOSIS — J309 Allergic rhinitis, unspecified: Secondary | ICD-10-CM

## 2015-01-07 DIAGNOSIS — E785 Hyperlipidemia, unspecified: Secondary | ICD-10-CM | POA: Diagnosis not present

## 2015-01-07 DIAGNOSIS — I10 Essential (primary) hypertension: Secondary | ICD-10-CM | POA: Diagnosis not present

## 2015-01-07 DIAGNOSIS — G4733 Obstructive sleep apnea (adult) (pediatric): Secondary | ICD-10-CM | POA: Diagnosis not present

## 2015-01-07 DIAGNOSIS — E559 Vitamin D deficiency, unspecified: Secondary | ICD-10-CM

## 2015-01-07 DIAGNOSIS — R5383 Other fatigue: Secondary | ICD-10-CM

## 2015-01-07 DIAGNOSIS — I480 Paroxysmal atrial fibrillation: Secondary | ICD-10-CM

## 2015-01-07 LAB — COMPREHENSIVE METABOLIC PANEL
ALK PHOS: 69 U/L (ref 39–117)
ALT: 39 U/L — ABNORMAL HIGH (ref 0–35)
AST: 55 U/L — AB (ref 0–37)
Albumin: 4.3 g/dL (ref 3.5–5.2)
BUN: 10 mg/dL (ref 6–23)
CO2: 29 meq/L (ref 19–32)
Calcium: 10.1 mg/dL (ref 8.4–10.5)
Chloride: 104 mEq/L (ref 96–112)
Creatinine, Ser: 0.83 mg/dL (ref 0.40–1.20)
GFR: 70.46 mL/min (ref >60.00)
Glucose, Bld: 92 mg/dL (ref 70–99)
Potassium: 4.1 mEq/L (ref 3.5–5.1)
Sodium: 138 mEq/L (ref 135–145)
TOTAL PROTEIN: 7.1 g/dL (ref 6.0–8.3)
Total Bilirubin: 1 mg/dL (ref 0.2–1.2)

## 2015-01-07 LAB — LIPID PANEL
CHOL/HDL RATIO: 5
Cholesterol: 230 mg/dL — ABNORMAL HIGH (ref 0–200)
HDL: 44.4 mg/dL (ref >39.00)
LDL CALC: 153 mg/dL — AB (ref 0–99)
NonHDL: 185.6
Triglycerides: 165 mg/dL — ABNORMAL HIGH (ref 0.0–149.0)
VLDL: 33 mg/dL (ref 0.0–40.0)

## 2015-01-07 LAB — VITAMIN D 25 HYDROXY (VIT D DEFICIENCY, FRACTURES): VITD: 37.19 ng/mL (ref 30.00–100.00)

## 2015-01-07 MED ORDER — METHYLPREDNISOLONE ACETATE 80 MG/ML IJ SUSP
80.0000 mg | Freq: Once | INTRAMUSCULAR | Status: AC
  Administered 2015-01-07: 80 mg via INTRAMUSCULAR

## 2015-01-07 NOTE — Patient Instructions (Signed)
Fat and Cholesterol Control Diet Fat and cholesterol levels in your blood and organs are influenced by your diet. High levels of fat and cholesterol may lead to diseases of the heart, small and large blood vessels, gallbladder, liver, and pancreas. CONTROLLING FAT AND CHOLESTEROL WITH DIET Although exercise and lifestyle factors are important, your diet is key. That is because certain foods are known to raise cholesterol and others to lower it. The goal is to balance foods for their effect on cholesterol and more importantly, to replace saturated and trans fat with other types of fat, such as monounsaturated fat, polyunsaturated fat, and omega-3 fatty acids. On average, a person should consume no more than 15 to 17 g of saturated fat daily. Saturated and trans fats are considered "bad" fats, and they will raise LDL cholesterol. Saturated fats are primarily found in animal products such as meats, butter, and cream. However, that does not mean you need to give up all your favorite foods. Today, there are good tasting, low-fat, low-cholesterol substitutes for most of the things you like to eat. Choose low-fat or nonfat alternatives. Choose round or loin cuts of red meat. These types of cuts are lowest in fat and cholesterol. Chicken (without the skin), fish, veal, and ground turkey breast are great choices. Eliminate fatty meats, such as hot dogs and salami. Even shellfish have little or no saturated fat. Have a 3 oz (85 g) portion when you eat lean meat, poultry, or fish. Trans fats are also called "partially hydrogenated oils." They are oils that have been scientifically manipulated so that they are solid at room temperature resulting in a longer shelf life and improved taste and texture of foods in which they are added. Trans fats are found in stick margarine, some tub margarines, cookies, crackers, and baked goods.  When baking and cooking, oils are a great substitute for butter. The monounsaturated oils are  especially beneficial since it is believed they lower LDL and raise HDL. The oils you should avoid entirely are saturated tropical oils, such as coconut and palm.  Remember to eat a lot from food groups that are naturally free of saturated and trans fat, including fish, fruit, vegetables, beans, grains (barley, rice, couscous, bulgur wheat), and pasta (without cream sauces).  IDENTIFYING FOODS THAT LOWER FAT AND CHOLESTEROL  Soluble fiber may lower your cholesterol. This type of fiber is found in fruits such as apples, vegetables such as broccoli, potatoes, and carrots, legumes such as beans, peas, and lentils, and grains such as barley. Foods fortified with plant sterols (phytosterol) may also lower cholesterol. You should eat at least 2 g per day of these foods for a cholesterol lowering effect.  Read package labels to identify low-saturated fats, trans fat free, and low-fat foods at the supermarket. Select cheeses that have only 2 to 3 g saturated fat per ounce. Use a heart-healthy tub margarine that is free of trans fats or partially hydrogenated oil. When buying baked goods (cookies, crackers), avoid partially hydrogenated oils. Breads and muffins should be made from whole grains (whole-wheat or whole oat flour, instead of "flour" or "enriched flour"). Buy non-creamy canned soups with reduced salt and no added fats.  FOOD PREPARATION TECHNIQUES  Never deep-fry. If you must fry, either stir-fry, which uses very little fat, or use non-stick cooking sprays. When possible, broil, bake, or roast meats, and steam vegetables. Instead of putting butter or margarine on vegetables, use lemon and herbs, applesauce, and cinnamon (for squash and sweet potatoes). Use nonfat   yogurt, salsa, and low-fat dressings for salads.  LOW-SATURATED FAT / LOW-FAT FOOD SUBSTITUTES Meats / Saturated Fat (g)  Avoid: Steak, marbled (3 oz/85 g) / 11 g  Choose: Steak, lean (3 oz/85 g) / 4 g  Avoid: Hamburger (3 oz/85 g) / 7  g  Choose: Hamburger, lean (3 oz/85 g) / 5 g  Avoid: Ham (3 oz/85 g) / 6 g  Choose: Ham, lean cut (3 oz/85 g) / 2.4 g  Avoid: Chicken, with skin, dark meat (3 oz/85 g) / 4 g  Choose: Chicken, skin removed, dark meat (3 oz/85 g) / 2 g  Avoid: Chicken, with skin, light meat (3 oz/85 g) / 2.5 g  Choose: Chicken, skin removed, light meat (3 oz/85 g) / 1 g Dairy / Saturated Fat (g)  Avoid: Whole milk (1 cup) / 5 g  Choose: Low-fat milk, 2% (1 cup) / 3 g  Choose: Low-fat milk, 1% (1 cup) / 1.5 g  Choose: Skim milk (1 cup) / 0.3 g  Avoid: Hard cheese (1 oz/28 g) / 6 g  Choose: Skim milk cheese (1 oz/28 g) / 2 to 3 g  Avoid: Cottage cheese, 4% fat (1 cup) / 6.5 g  Choose: Low-fat cottage cheese, 1% fat (1 cup) / 1.5 g  Avoid: Ice cream (1 cup) / 9 g  Choose: Sherbet (1 cup) / 2.5 g  Choose: Nonfat frozen yogurt (1 cup) / 0.3 g  Choose: Frozen fruit bar / trace  Avoid: Whipped cream (1 tbs) / 3.5 g  Choose: Nondairy whipped topping (1 tbs) / 1 g Condiments / Saturated Fat (g)  Avoid: Mayonnaise (1 tbs) / 2 g  Choose: Low-fat mayonnaise (1 tbs) / 1 g  Avoid: Butter (1 tbs) / 7 g  Choose: Extra light margarine (1 tbs) / 1 g  Avoid: Coconut oil (1 tbs) / 11.8 g  Choose: Olive oil (1 tbs) / 1.8 g  Choose: Corn oil (1 tbs) / 1.7 g  Choose: Safflower oil (1 tbs) / 1.2 g  Choose: Sunflower oil (1 tbs) / 1.4 g  Choose: Soybean oil (1 tbs) / 2.4 g  Choose: Canola oil (1 tbs) / 1 g Document Released: 08/03/2005 Document Revised: 11/28/2012 Document Reviewed: 11/01/2013 Exit[employer] Patient Information 2015 [employer]. This information is not intended to replace advice given to you by your health care provider. Make sure you discuss any questions you have with your health care provider.  

## 2015-01-07 NOTE — Assessment & Plan Note (Signed)
Will recheck Vit D levels today

## 2015-01-07 NOTE — Progress Notes (Signed)
Subjective:    Patient ID: Jill Snow, female    DOB: Nov 13, 1933, 81 y.o.   MRN: [id number]  HPI  Pt presents to the clinic today for 3 months follow up of chronic conditions.  HLD: She was started on Zocor 09/2014. She was taking it as prescribed. She reports some muscle cramps but it is intermittent. She saw Dr. Rockey Situ 01/04/15 and reports Dr. Rockey Situ stopped her Zocor and put her back on Crestor. She reports the Crestor caused her severe muscle cramping in the past. She does try to consume a low fat, low cholesterol diet.  AFIB: She just followed up with her cardiologist, Dr. Rockey Situ, note reviewed. She will continue on Amiodarone. She is not on any anticoagulation.  HTN: BP is well controlled on Lisinopril. BP today is 120/68.  OSA on CPAP: Not wearing her CPAP for the last 3 months because it is old and does not work. She needs a new machine. She has been feeling increasingly fatigued.  Vit D deficiency: She is taking the Vit d supplement weekly. She is due to have her Vit D levels rechecked today.  Seasonal Allergies: She reports these seem to be getting worse since she returned from her trip to [place]. She has nasal congestion, sore throat and cough. She is not blowing anything out of her nose. She is coughing up thick yellow mucous, mostly in the morning. She also reports some mild facial pressure, but denies fever, chills or body aches. She takes [employer] account planner daily.  Review of Systems      Past Medical History  Diagnosis Date  . Atrial fibrillation     paroxysmal  . Hypertension   . Hypercholesterolemia   . Mitral insufficiency     Mild mitral insufficiency  . Dyspnea   . Seasonal allergies   . Sleep apnea   . RBBB (right bundle branch block)   . History of chicken pox   . Arrhythmia     Current Outpatient Prescriptions  Medication Sig Dispense Refill  . ALPRAZolam (XANAX) 0.25 MG tablet TAKE ONE TABLET BY MOUTH AT BEDTIME AS NEEDED 30 tablet 0  .  amiodarone (PACERONE) 200 MG tablet TAKE ONE TABLET BY MOUTH EVERY DAY 30 tablet 3  . fexofenadine (ALLEGRA) 180 MG tablet Take 1 tablet (180 mg total) by mouth daily. 30 tablet 2  . fluticasone (FLONASE) 50 MCG/ACT nasal spray TAKE 2 SPRAYS IN EACH NOSTRIL EVERY DAY 16 g 5  . lisinopril (PRINIVIL,ZESTRIL) 10 MG tablet Take 10 mg by mouth daily.    . mometasone (NASONEX) 50 MCG/ACT nasal spray Place 2 sprays into the nose daily. 17 g 12  . montelukast (SINGULAIR) 10 MG tablet Take 10 mg by mouth at bedtime.    . rosuvastatin (CRESTOR) 10 MG tablet Take 1 tablet (10 mg total) by mouth daily. 30 tablet 11  . Vitamin D, Ergocalciferol, (DRISDOL) 50000 UNITS CAPS capsule Take 1 capsule (50,000 Units total) by mouth every 7 (seven) days. 12 capsule 3  . zolpidem (AMBIEN) 5 MG tablet TAKE ONE TABLET BY MOUTH AT BEDTIME AS NEEDED 30 tablet 0   No current facility-administered medications for this visit.    Allergies  Allergen Reactions  . Codeine Anaphylaxis    "cant remember reaction"  . Augmentin [Amoxicillin-Pot Clavulanate] Nausea And Vomiting  . Crestor [Rosuvastatin] Other (See Comments)    Muscle pain/cramps     Family History  Problem Relation Age of Onset  . Stroke Mother   .  Heart attack Mother   . Arthritis Mother   . Hyperlipidemia Mother   . Heart disease Mother   . Diabetes Mother   . Diabetes Sister   . Cancer Daughter   . Heart disease Daughter     History   Social History  . Marital Status: Widowed    Spouse Name: N/A  . Number of Children: 2  . Years of Education: N/A   Occupational History  . REALTOR    Social History Main Topics  . Smoking status: Never Smoker   . Smokeless tobacco: Never Used  . Alcohol Use: No  . Drug Use: No  . Sexual Activity: No   Other Topics Concern  . Not on file   Social History Narrative     Constitutional: Pt reports fatigue. Denies fever, malaise, headache or abrupt weight changes.  HEENT: Pt reports nasal  congestion and sore throat. Denies eye pain, eye redness, ear pain, ringing in the ears, wax buildup, runny nose or bloody nose. Respiratory: Pt reports cough. Denies difficulty breathing, shortness of breath,or sputum production.   Cardiovascular: Denies chest pain, chest tightness, palpitations or swelling in the hands or feet.  Musculoskeletal: Pt reports leg cramps. Denies decrease in range of motion, difficulty with gait, or joint pain and swelling.  Skin: Denies redness, rashes, lesions or ulcercations.  Neurological: Pt reports difficulty sleeping. Denies dizziness, difficulty with memory, difficulty with speech or problems with balance and coordination.   No other specific complaints in a complete review of systems (except as listed in HPI above).  Objective:   Physical Exam   BP 120/68 mmHg  Pulse 74  Temp(Src) 98.7 F (37.1 C) (Oral)  Wt 166 lb (75.297 kg)  SpO2 98% Wt Readings from Last 3 Encounters:  01/07/15 166 lb (75.297 kg)  01/04/15 166 lb 8 oz (75.524 kg)  11/28/14 165 lb (74.844 kg)    General: Appears her stated age, well developed, well nourished in NAD. Skin: Warm, dry and intact. No rashes, lesions or ulcerations noted. HEENT: Head: normal shape and size, mild maxillary sinus tenderness noted; Eyes: sclera white, no icterus, conjunctiva pink; Ears: Tm's gray and intact, normal light reflex, slight wax buildup bilaterally; Nose: mucosa boggy and moist, septum midline; Throat/Mouth: Teeth present, mucosa pink and moist, no exudate, lesions or ulcerations noted.  Neck: No adenopathy noted. Cardiovascular: Normal rate and rhythm. S1,S2 noted.  No murmur, rubs or gallops noted. No JVD or BLE edema. No carotid bruits noted. Pulmonary/Chest: Normal effort and positive vesicular breath sounds. No respiratory distress. No wheezes, rales or ronchi noted.  Musculoskeletal: No pain with palpation of the calves, bilaterally. No swelling noted. No difficulty with gait.    Neurological: Alert. She does seem mildly confused at times, has difficulty with recalling information that Dr. Rockey Situ gave her on Friday.    BMET    Component Value Date/Time   NA 141 10/09/2014 1026   NA 139 05/15/2013 0514   K 3.7 10/09/2014 1026   K 3.7 05/15/2013 0514   CL 105 10/09/2014 1026   CL 105 05/15/2013 0514   CO2 31 10/09/2014 1026   CO2 30 05/15/2013 0514   GLUCOSE 109* 10/09/2014 1026   GLUCOSE 92 05/15/2013 0514   BUN 10 10/09/2014 1026   BUN 11 05/15/2013 0514   CREATININE 0.79 10/09/2014 1026   CREATININE 1.25 05/15/2013 0514   CALCIUM 9.6 10/09/2014 1026   CALCIUM 9.5 05/15/2013 0514   GFRNONAA 41* 05/15/2013 0514   GFRNONAA >60  03/06/2010 1411   GFRAA 48* 05/15/2013 0514   GFRAA  03/06/2010 1411    >60        The eGFR has been calculated using the MDRD equation. This calculation has not been validated in all clinical situations. eGFR's persistently <60 mL/min signify possible Chronic Kidney Disease.    Lipid Panel     Component Value Date/Time   CHOL 240* 10/09/2014 1026   CHOL 281* 03/07/2014 0832   TRIG 222.0* 10/09/2014 1026   HDL 42.20 10/09/2014 1026   HDL 49 03/07/2014 0832   CHOLHDL 6 10/09/2014 1026   CHOLHDL 5.7* 03/07/2014 0832   VLDL 44.4* 10/09/2014 1026   LDLCALC 195* 06/25/2014 1045   LDLCALC 202* 03/07/2014 0832    CBC    Component Value Date/Time   WBC 5.1 06/25/2014 1045   WBC 4.9 05/15/2013 0514   RBC 4.61 06/25/2014 1045   RBC 4.73 05/15/2013 0514   HGB 14.7 06/25/2014 1045   HGB 14.6 05/15/2013 0514   HCT 44.0 06/25/2014 1045   HCT 43.6 05/15/2013 0514   PLT 200.0 06/25/2014 1045   PLT 171 05/15/2013 0514   MCV 95.6 06/25/2014 1045   MCV 92 05/15/2013 0514   MCH 30.9 05/15/2013 0514   MCH 32.4 03/06/2010 1710   MCHC 33.3 06/25/2014 1045   MCHC 33.5 05/15/2013 0514   RDW 12.6 06/25/2014 1045   RDW 13.9 05/15/2013 0514   LYMPHSABS 1.5 05/15/2013 0514   MONOABS 0.4 05/15/2013 0514   EOSABS 0.3  05/15/2013 0514   BASOSABS 0.1 05/15/2013 0514    Hgb A1C No results found for: HGBA1C      Assessment & Plan:   Allergic Rhinitis:  Continue Allegra and Flonase 80 mg Depo IM today If symptoms persist or worsen prior to Friday, call me, will call in Ceftin BID x 10 days  RTC in 6 months or sooner if needed

## 2015-01-07 NOTE — Assessment & Plan Note (Signed)
She is still taking Zocor although Dr. Rockey Situ did just give her a RX for Crestor last week, she has not started taking it Will check CMET and Lipid profile today Handout given on low fat diet

## 2015-01-07 NOTE — Addendum Note (Signed)
Addended by: Lurlean Nanny on: 01/07/2015 10:36 AM   Modules accepted: Orders

## 2015-01-07 NOTE — Telephone Encounter (Signed)
p left v/m; pt was seen earlier today and pt forgot to ask if could get Vit B 12 injection due to low energy level. Pt request cb.

## 2015-01-07 NOTE — Assessment & Plan Note (Addendum)
Regular on Amiodarone She is not on any anticoagulation at this time Will have her continue to follow with Dr. Rockey Situ

## 2015-01-07 NOTE — Assessment & Plan Note (Signed)
RX for a new mask, tubing and machine today Could not find records of sleep study She will request this from Dr. Chancy Milroy

## 2015-01-07 NOTE — Progress Notes (Signed)
Pre visit review using our clinic review tool, if applicable. No additional management support is needed unless otherwise documented below in the visit note. 

## 2015-01-07 NOTE — Assessment & Plan Note (Signed)
Well controlled on Lisinopril Will check CMET today

## 2015-01-07 NOTE — Telephone Encounter (Signed)
We will have to recheck her B12 level, please order future B12 and have her schedule lab appt

## 2015-01-08 NOTE — Addendum Note (Signed)
Addended by: Lurlean Nanny on: 01/08/2015 10:15 AM   Modules accepted: Orders

## 2015-01-08 NOTE — Telephone Encounter (Signed)
Left detailed msg on VM per HIPAA b12 lab has been future ordered Pt can make a lab only appt

## 2015-01-09 ENCOUNTER — Other Ambulatory Visit: Payer: Self-pay | Admitting: Internal Medicine

## 2015-01-09 ENCOUNTER — Other Ambulatory Visit: Payer: Self-pay

## 2015-01-09 ENCOUNTER — Other Ambulatory Visit (INDEPENDENT_AMBULATORY_CARE_PROVIDER_SITE_OTHER): Payer: PPO

## 2015-01-09 DIAGNOSIS — R5383 Other fatigue: Secondary | ICD-10-CM | POA: Diagnosis not present

## 2015-01-09 MED ORDER — SIMVASTATIN 40 MG PO TABS: 40.0000 mg | ORAL_TABLET | Freq: Every day | ORAL | Status: DC

## 2015-01-09 MED ORDER — CEFUROXIME AXETIL 500 MG PO TABS: 500.0000 mg | ORAL_TABLET | Freq: Two times a day (BID) | ORAL | Status: DC

## 2015-01-10 LAB — VITAMIN B12: Vitamin B-12: 469 pg/mL (ref 211–911)

## 2015-01-10 NOTE — Telephone Encounter (Signed)
Ok to phone in Xanax 

## 2015-01-10 NOTE — Telephone Encounter (Signed)
Rx called in to pharmacy. 

## 2015-01-10 NOTE — Telephone Encounter (Signed)
Last filled 12/03/2014--please advise

## 2015-02-19 ENCOUNTER — Encounter: Payer: Self-pay | Admitting: Physician Assistant

## 2015-02-19 ENCOUNTER — Ambulatory Visit (INDEPENDENT_AMBULATORY_CARE_PROVIDER_SITE_OTHER): Payer: PPO | Admitting: Physician Assistant

## 2015-02-19 ENCOUNTER — Telehealth: Payer: Self-pay | Admitting: Cardiovascular Disease

## 2015-02-19 VITALS — BP 110/62 | HR 68 | Ht 67.5 in | Wt 165.0 lb

## 2015-02-19 DIAGNOSIS — I480 Paroxysmal atrial fibrillation: Secondary | ICD-10-CM | POA: Diagnosis not present

## 2015-02-19 DIAGNOSIS — I4892 Unspecified atrial flutter: Secondary | ICD-10-CM | POA: Insufficient documentation

## 2015-02-19 DIAGNOSIS — I4510 Unspecified right bundle-branch block: Secondary | ICD-10-CM

## 2015-02-19 DIAGNOSIS — E7800 Pure hypercholesterolemia, unspecified: Secondary | ICD-10-CM

## 2015-02-19 DIAGNOSIS — R0602 Shortness of breath: Secondary | ICD-10-CM | POA: Diagnosis not present

## 2015-02-19 DIAGNOSIS — I340 Nonrheumatic mitral (valve) insufficiency: Secondary | ICD-10-CM | POA: Insufficient documentation

## 2015-02-19 DIAGNOSIS — I10 Essential (primary) hypertension: Secondary | ICD-10-CM | POA: Diagnosis not present

## 2015-02-19 DIAGNOSIS — G4733 Obstructive sleep apnea (adult) (pediatric): Secondary | ICD-10-CM

## 2015-02-19 DIAGNOSIS — Z9989 Dependence on other enabling machines and devices: Secondary | ICD-10-CM
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
## 2015-02-19 NOTE — Patient Instructions (Signed)
Medication Instructions:  Your physician recommends that you continue on your current medications as directed. Please refer to the Current Medication list given to you today.   Labwork: none  Testing/Procedures: Your physician has requested that you have an echocardiogram. Echocardiography is a painless test that uses sound waves to create images of your heart. It provides your doctor with information about the size and shape of your heart and how well your heart's chambers and valves are working. This procedure takes approximately one hour. There are no restrictions for this procedure.    Follow-Up: Your physician recommends that you schedule a follow-up appointment pending echo results.   Any Other Special Instructions Will Be Listed Below (If Applicable).

## 2015-02-20 ENCOUNTER — Institutional Professional Consult (permissible substitution): Payer: PPO | Admitting: Pulmonary Disease

## 2015-03-11 ENCOUNTER — Other Ambulatory Visit: Payer: Self-pay | Admitting: Internal Medicine

## 2015-03-11 NOTE — Telephone Encounter (Signed)
Last filled 02/10/2015--please advise

## 2015-03-11 NOTE — Telephone Encounter (Signed)
Ok to phone in xanax 

## 2015-03-12 NOTE — Telephone Encounter (Signed)
Rx called in to pharmacy. 

## 2015-03-21 ENCOUNTER — Ambulatory Visit (INDEPENDENT_AMBULATORY_CARE_PROVIDER_SITE_OTHER): Payer: PPO

## 2015-03-21 ENCOUNTER — Other Ambulatory Visit: Payer: Self-pay

## 2015-03-21 ENCOUNTER — Telehealth: Payer: Self-pay | Admitting: *Deleted

## 2015-03-21 DIAGNOSIS — R0602 Shortness of breath: Secondary | ICD-10-CM | POA: Diagnosis not present

## 2015-03-21 NOTE — Telephone Encounter (Signed)
Pt is asking if we can order her a sleep study.  She states she is always tried, states last night she went to bed around 10 pm and right now she is tired. Asked her if she's tried with a PCP and she stated Dr Rockey Situ said he would take of her. I then stated to her that it would be best if she got a PCP for if she wanted to see Dr Rockey Situ it would not be until Oct.  She stated "well I could die by then" I need someone to see me and or order this for me.  Pt already has apt for sleep study consult in oct, she stated she can't wait that long.  I added her to the wait list for them. So hopefully they can get her in sooner.  She insisted that we order it for when she went to sleep study here in [place] they said they just need an order.  Please advise I stated to her that she needs a PCP and or A pulmonologist but she would not understand.

## 2015-03-21 NOTE — Telephone Encounter (Signed)
Spoke w/ pt.  Advised her that we can refer her to the sleep center, as discussed at her last ov, but we cannot order a new CPAP machine for her.  She reports that she missed her last appt for sleep study consult, as she got lost, was 20 mins late and they wouldn't see her.  Discussed w/ her at length and advised her that she is on the waiting list for pulm consult. She states that her PCP will not order for her.  She is appreciative of the call and will wait for a call from pulm in the event of a cancellation.

## 2015-03-22 ENCOUNTER — Ambulatory Visit (INDEPENDENT_AMBULATORY_CARE_PROVIDER_SITE_OTHER): Payer: PPO | Admitting: Internal Medicine

## 2015-03-22 ENCOUNTER — Encounter: Payer: Self-pay | Admitting: Internal Medicine

## 2015-03-22 VITALS — BP 122/70 | HR 61 | Ht 66.5 in | Wt 165.0 lb

## 2015-03-22 DIAGNOSIS — R413 Other amnesia: Secondary | ICD-10-CM | POA: Diagnosis not present

## 2015-03-22 DIAGNOSIS — I480 Paroxysmal atrial fibrillation: Secondary | ICD-10-CM | POA: Diagnosis not present

## 2015-03-22 DIAGNOSIS — G4719 Other hypersomnia: Secondary | ICD-10-CM | POA: Insufficient documentation

## 2015-03-22 DIAGNOSIS — Z9989 Dependence on other enabling machines and devices: Principal | ICD-10-CM

## 2015-03-22 DIAGNOSIS — G4733 Obstructive sleep apnea (adult) (pediatric): Secondary | ICD-10-CM

## 2015-03-22 DIAGNOSIS — I10 Essential (primary) hypertension: Secondary | ICD-10-CM | POA: Insufficient documentation

## 2015-03-22 NOTE — Progress Notes (Addendum)
[facility] Consultation      Assessment and Plan: Obstructive sleep apnea on CPAP --Currently. I have the patient's sleep study from 2011 from [place]. We do not have access to the titration study from that time. There was apparently another titration study done since that time through Dr. Marella Bile office, will need to obtain a copy of this report as well. -We'll place an order for the patient receive a new CPAP machine as well as supplies and have her follow-up in a proximally 6 months time.  Atrial fibrillation --May be exacerbated by OSA, it is important for her to get back on CPAP.   Memory impairment -Mild cognitive dysfunction since she has been off her CPAP, which is likely due to untreated obstructive sleep apnea  Excessive daytime sleepiness Due to obstructive sleep apnea.   Addendum: Review of the patient's most recent sleep study on 12/26/2010 shows the following, the patient was sent for a split-night study at that time, her apnea popping index was noted to be 35 with mild snoring noted throughout the study. She was titrated on CPAP to an ideal pressure of 9.   Date: 03/22/2015  MRN# [id number] Jill Snow 22-Feb-1934  Referring Physician:   ADDILEE Snow is a 81 y.o. old female seen in consultation for sleep apnea  CC:  Chief Complaint  Patient presents with  . Advice Only    feels tired all day; SOB w/activity at times; had CPAP that broke over a year ago and has not used one since;    HPI:   The patient is a 81 year old female with a history, systolic congestive heart failure, atrial fibrillation hypertension and obstructive sleep apnea. Echo 03/21/2015: Ejection fraction was 60-65% echo 2014: EF 40-45%, mildly dilated RV, mod reduced RV systolic fxn, mod dilated LA, Mild to mod MR, mod TR, mildly elevated PASP   She was diagnosed with sleep apnea about 6  Years ago at [facility]. She had another one since that time. Review of that sleep  study on 01/07/10 showed an AHI of 23, she was then sent for a titration study and started on CPAP, though I do not have that report, she also tells me that she had another more recent.   Her CPAP broke about 7 months ago, she has not been on cpap since that time. She is feeling tired, sleepy during the day. She does not feel refreshed during the day when she wakes in the am.  She goes to bed at 1030, wakes at 7. She works as a Cabin crew, and wakes around the same time on the weekends.  She notes that her memory is slightly impaired since she has been off of CPAP.    PMHX:   Past Medical History  Diagnosis Date  . PAF (paroxysmal atrial fibrillation)     a. initial episode 2011; b. not on long term anticoagulation since 06/2013  . Hypertension   . Hypercholesterolemia   . Mitral regurgitation     a. echo 2014: EF 40-45%, mildly dilated RV, mod reduced RV systolic fxn, mod dilated LA, Mild to mod MR, mod TR, mildly elevated PASP  . Dyspnea   . Seasonal allergies   . Sleep apnea     a. on CPAP  . RBBB (right bundle branch block)   . History of chicken pox   . Atrial flutter     a. s/p successful TEE/DCCV in 2014; b. TEE 2014 showed EF 45-50%, mild bi-atrial enlargement, mod MR  Surgical Hx:  Past Surgical History  Procedure Laterality Date  . Abdominal hysterectomy    . Tummy tuck    . Tonsillectomy and adenoidectomy  1947   Family Hx:  Family History  Problem Relation Age of Onset  . Stroke Mother   . Heart attack Mother   . Arthritis Mother   . Hyperlipidemia Mother   . Heart disease Mother   . Diabetes Mother   . Diabetes Sister   . Cancer Daughter   . Heart disease Daughter    Social Hx:   History  Substance Use Topics  . Smoking status: Never Smoker   . Smokeless tobacco: Never Used  . Alcohol Use: No   Medication:   Current Outpatient Rx  Name  Route  Sig  Dispense  Refill  . ALPRAZolam (XANAX) 0.25 MG tablet      TAKE ONE TABLET AT BEDTIME AS NEEDED   30  tablet   0   . amiodarone (PACERONE) 200 MG tablet      TAKE ONE TABLET BY MOUTH EVERY DAY   30 tablet   3   . fexofenadine (ALLEGRA) 180 MG tablet   Oral   Take 1 tablet (180 mg total) by mouth daily.   30 tablet   2   . fluticasone (FLONASE) 50 MCG/ACT nasal spray      TAKE 2 SPRAYS IN EACH NOSTRIL EVERY DAY   16 g   5   . lisinopril (PRINIVIL,ZESTRIL) 10 MG tablet   Oral   Take 10 mg by mouth daily.         . montelukast (SINGULAIR) 10 MG tablet   Oral   Take 10 mg by mouth at bedtime.         . simvastatin (ZOCOR) 40 MG tablet   Oral   Take 1 tablet (40 mg total) by mouth at bedtime.   30 tablet   2   . Vitamin D, Ergocalciferol, (DRISDOL) 50000 UNITS CAPS capsule   Oral   Take 1 capsule (50,000 Units total) by mouth every 7 (seven) days.   12 capsule   3   . zolpidem (AMBIEN) 5 MG tablet      TAKE ONE TABLET BY MOUTH AT BEDTIME AS NEEDED   30 tablet   0       Allergies:  Codeine; Augmentin; and Crestor  Review of Systems: Gen:  Denies  fever, sweats, chills HEENT: Denies blurred vision, double vision Cvc:  No dizziness, chest pain or heaviness Resp:   Denies cough or sputum porduction,  Gi: Denies swallowing difficulty, stomach pain,  Gu:  Denies bladder incontinence, burning urine Ext:   No Joint pain, stiffness or swelling Skin: No skin rash, easy bruising or bleeding or hives Endoc:  No polyuria, polydipsia , polyphagia or weight change Psych: No depression, insomnia or hallucinations  Other:  All other systems negative  Physical Examination:   VS: BP 122/70 mmHg  Pulse 61  Ht 5' 6.5" (1.689 m)  Wt 165 lb (74.844 kg)  BMI 26.24 kg/m2  SpO2 98%  General Appearance: No distress  Neuro:without focal findings,  speech normal,  HEENT: PERRLA, EOM intact.  Mallampati 3 Pulmonary: normal breath sounds, No wheezing, No rales;    CardiovascularNormal S1,S2.  No m/r/g.   Abdomen: Benign, Soft, non-tender. Renal:  No costovertebral  tenderness  GU:  No performed at this time. Endoc: No evident thyromegaly, no signs of acromegaly or Cushing features Skin:   warm, no rashes,  no ecchymosis  Extremities: normal, no cyanosis, clubbing, no edema, warm with normal capillary refill. Other findings:   Labs results:   Rad results:     Other:      Orders for this visit: No orders of the defined types were placed in this encounter.     Thank  you for the consultation and for allowing [facility] to assist in the care of your patient. Our recommendations are noted above.  Please contact us if we can be of further service.   Marda Stalker, MD.  Board Certified in Internal Medicine, Pulmonary Medicine, [place], and Sleep Medicine.  [facility] Office Number: [phone]  Ahnna Pesa, M.D.  Vilinda Boehringer, M.D.  Cheral Marker, M.D

## 2015-03-22 NOTE — Addendum Note (Signed)
Addended by: Oscar La R on: 03/22/2015 04:28 PM   Modules accepted: Orders

## 2015-03-22 NOTE — Assessment & Plan Note (Signed)
-  Mild cognitive dysfunction since she has been off her CPAP, which is likely due to untreated obstructive sleep apnea

## 2015-03-22 NOTE — Assessment & Plan Note (Addendum)
--  Currently. I have the patient's sleep study from 2011 from [place]. We do not have access to the titration study from that time. There was apparently another titration study done since that time through Dr. Marella Bile office, will need to obtain a copy of this report as well. -We'll place an order for the patient receive a new CPAP machine as well as supplies and have her follow-up in a proximally 6 months time.

## 2015-03-22 NOTE — Patient Instructions (Signed)
We will obtain copies of the old sleep studies and place an order for a new CPAP and supplies for you. Follow-up in 6 months.

## 2015-03-22 NOTE — Assessment & Plan Note (Signed)
--  May be exacerbated by OSA, it is important for her to get back on CPAP.

## 2015-03-22 NOTE — Assessment & Plan Note (Signed)
Due to obstructive sleep apnea.

## 2015-03-29 ENCOUNTER — Telehealth: Payer: Self-pay | Admitting: Internal Medicine

## 2015-03-29 DIAGNOSIS — G4733 Obstructive sleep apnea (adult) (pediatric): Secondary | ICD-10-CM

## 2015-03-29 NOTE — Telephone Encounter (Signed)
Per [facility], patient will need new Sleep Study  Please advise if you want Split Study, Home Study, etc..

## 2015-03-30 ENCOUNTER — Emergency Department
Admission: EM | Admit: 2015-03-30 | Discharge: 2015-03-30 | Disposition: A | Discharge status: Home / Self Care | Payer: PPO | Attending: Emergency Medicine | Admitting: Emergency Medicine

## 2015-03-30 ENCOUNTER — Encounter: Payer: Self-pay | Admitting: *Deleted

## 2015-03-30 DIAGNOSIS — Y998 Other external cause status: Secondary | ICD-10-CM | POA: Diagnosis not present

## 2015-03-30 DIAGNOSIS — Z7951 Long term (current) use of inhaled steroids: Secondary | ICD-10-CM | POA: Diagnosis not present

## 2015-03-30 DIAGNOSIS — Y288XXA Contact with other sharp object, undetermined intent, initial encounter: Secondary | ICD-10-CM | POA: Insufficient documentation

## 2015-03-30 DIAGNOSIS — I10 Essential (primary) hypertension: Secondary | ICD-10-CM | POA: Diagnosis not present

## 2015-03-30 DIAGNOSIS — Y9389 Activity, other specified: Secondary | ICD-10-CM | POA: Insufficient documentation

## 2015-03-30 DIAGNOSIS — L03115 Cellulitis of right lower limb: Secondary | ICD-10-CM

## 2015-03-30 DIAGNOSIS — Z792 Long term (current) use of antibiotics: Secondary | ICD-10-CM | POA: Diagnosis not present

## 2015-03-30 DIAGNOSIS — Y9289 Other specified places as the place of occurrence of the external cause: Secondary | ICD-10-CM | POA: Diagnosis not present

## 2015-03-30 DIAGNOSIS — Z79899 Other long term (current) drug therapy: Secondary | ICD-10-CM | POA: Insufficient documentation

## 2015-03-30 DIAGNOSIS — S8991XA Unspecified injury of right lower leg, initial encounter: Secondary | ICD-10-CM | POA: Diagnosis present

## 2015-03-30 LAB — CBC WITH DIFFERENTIAL/PLATELET
BASOS ABS: 0.1 10*3/uL (ref 0–0.1)
Basophils Relative: 1 %
Eosinophils Absolute: 0.2 10*3/uL (ref 0–0.7)
Eosinophils Relative: 3 %
HCT: 41.7 % (ref 35.0–47.0)
HEMOGLOBIN: 14.4 g/dL (ref 12.0–16.0)
LYMPHS ABS: 0.8 10*3/uL — AB (ref 1.0–3.6)
Lymphocytes Relative: 11 %
MCH: 32.4 pg (ref 26.0–34.0)
MCHC: 34.4 g/dL (ref 32.0–36.0)
MCV: 94 fL (ref 80.0–100.0)
MONO ABS: 0.8 10*3/uL (ref 0.2–0.9)
MONOS PCT: 11 %
Neutro Abs: 5.6 10*3/uL (ref 1.4–6.5)
Neutrophils Relative %: 74 %
Platelets: 164 10*3/uL (ref 150–440)
RBC: 4.44 MIL/uL (ref 3.80–5.20)
RDW: 12.9 % (ref 11.5–14.5)
WBC: 7.4 10*3/uL (ref 3.6–11.0)

## 2015-03-30 LAB — COMPREHENSIVE METABOLIC PANEL
ALBUMIN: 4.4 g/dL (ref 3.5–5.0)
ALT: 35 U/L (ref 14–54)
ANION GAP: 6 (ref 5–15)
AST: 35 U/L (ref 15–41)
Alkaline Phosphatase: 68 U/L (ref 38–126)
BUN: 12 mg/dL (ref 6–20)
CO2: 27 mmol/L (ref 22–32)
CREATININE: 0.71 mg/dL (ref 0.44–1.00)
Calcium: 9.6 mg/dL (ref 8.9–10.3)
Chloride: 106 mmol/L (ref 101–111)
GFR calc Af Amer: >60 mL/min (ref >60)
GFR calc non Af Amer: >60 mL/min (ref >60)
GLUCOSE: 101 mg/dL — AB (ref 65–99)
Potassium: 3.9 mmol/L (ref 3.5–5.1)
SODIUM: 139 mmol/L (ref 135–145)
TOTAL PROTEIN: 7.4 g/dL (ref 6.5–8.1)
Total Bilirubin: 1.1 mg/dL (ref 0.3–1.2)

## 2015-03-30 MED ORDER — SODIUM CHLORIDE 0.9 % IV BOLUS (SEPSIS)
1000.0000 mL | Freq: Once | INTRAVENOUS | Status: AC
  Administered 2015-03-30: 1000 mL via INTRAVENOUS

## 2015-03-30 MED ORDER — CLINDAMYCIN HCL 300 MG PO CAPS: 300.0000 mg | ORAL_CAPSULE | Freq: Three times a day (TID) | ORAL | Status: DC

## 2015-03-30 MED ORDER — CLINDAMYCIN PHOSPHATE 600 MG/50ML IV SOLN
600.0000 mg | Freq: Once | INTRAVENOUS | Status: AC
  Administered 2015-03-30: 600 mg via INTRAVENOUS
  Filled 2015-03-30: qty 50

## 2015-03-30 MED ORDER — KETOROLAC TROMETHAMINE 30 MG/ML IJ SOLN
30.0000 mg | Freq: Once | INTRAMUSCULAR | Status: AC
  Administered 2015-03-30: 30 mg via INTRAVENOUS
  Filled 2015-03-30: qty 1

## 2015-03-30 NOTE — ED Provider Notes (Signed)
[facility] Emergency Department Provider Note ____________________________________________  Time seen: Approximately 9:49 AM  I have reviewed the triage vital signs and the nursing notes.   HISTORY  Chief Complaint Wound Infection   HPI Jill Snow is a 81 y.o. female presents to the emergency department for evaluation of right leg and knee pain. She states that she accidentally cut herself with a new "ninja" blade 2 weeks ago. She states that the pain in the swelling has been progressively worsening. She noticed last night that it began to drain yellow fluid and "it had fever in it."   Past Medical History  Diagnosis Date  . PAF (paroxysmal atrial fibrillation)     a. initial episode 2011; b. not on long term anticoagulation since 06/2013  . Hypertension   . Hypercholesterolemia   . Mitral regurgitation     a. echo 2014: EF 40-45%, mildly dilated RV, mod reduced RV systolic fxn, mod dilated LA, Mild to mod MR, mod TR, mildly elevated PASP  . Dyspnea   . Seasonal allergies   . Sleep apnea     a. on CPAP  . RBBB (right bundle branch block)   . History of chicken pox   . Atrial flutter     a. s/p successful TEE/DCCV in 2014; b. TEE 2014 showed EF 45-50%, mild bi-atrial enlargement, mod MR  . Atrial fibrillation     Patient Active Problem List   Diagnosis Date Noted  . Excessive daytime sleepiness 03/22/2015  . Memory impairment 03/22/2015  . Essential hypertension 03/22/2015  . Atrial flutter   . PAF (paroxysmal atrial fibrillation)   . Mitral regurgitation   . Vitamin D deficiency 08/29/2014  . Obstructive sleep apnea on CPAP 03/07/2014  . Atrial fibrillation   . Hypertension   . Hypercholesterolemia   . RBBB (right bundle branch block)     Past Surgical History  Procedure Laterality Date  . Abdominal hysterectomy    . Tummy tuck    . Tonsillectomy and adenoidectomy  1947    Current Outpatient Rx  Name  Route  Sig  Dispense   Refill  . ALPRAZolam (XANAX) 0.25 MG tablet      TAKE ONE TABLET AT BEDTIME AS NEEDED   30 tablet   0   . amiodarone (PACERONE) 200 MG tablet      TAKE ONE TABLET BY MOUTH EVERY DAY   30 tablet   3   . clindamycin (CLEOCIN) 300 MG capsule   Oral   Take 1 capsule (300 mg total) by mouth 3 (three) times daily.   30 capsule   0   . fexofenadine (ALLEGRA) 180 MG tablet   Oral   Take 1 tablet (180 mg total) by mouth daily.   30 tablet   2   . fluticasone (FLONASE) 50 MCG/ACT nasal spray      TAKE 2 SPRAYS IN EACH NOSTRIL EVERY DAY   16 g   5   . lisinopril (PRINIVIL,ZESTRIL) 10 MG tablet   Oral   Take 10 mg by mouth daily.         . montelukast (SINGULAIR) 10 MG tablet   Oral   Take 10 mg by mouth at bedtime.         . simvastatin (ZOCOR) 40 MG tablet   Oral   Take 1 tablet (40 mg total) by mouth at bedtime.   30 tablet   2   . Vitamin D, Ergocalciferol, (DRISDOL) 50000 UNITS CAPS capsule  Oral   Take 1 capsule (50,000 Units total) by mouth every 7 (seven) days.   12 capsule   3   . zolpidem (AMBIEN) 5 MG tablet      TAKE ONE TABLET BY MOUTH AT BEDTIME AS NEEDED   30 tablet   0     Allergies Codeine; Augmentin; and Crestor  Family History  Problem Relation Age of Onset  . Stroke Mother   . Heart attack Mother   . Arthritis Mother   . Hyperlipidemia Mother   . Heart disease Mother   . Diabetes Mother   . Diabetes Sister   . Cancer Daughter   . Heart disease Daughter     Social History Social History  Substance Use Topics  . Smoking status: Never Smoker   . Smokeless tobacco: Never Used  . Alcohol Use: No    Review of Systems   Constitutional: No fever/chills Eyes: No visual changes. ENT: No congestion or rhinorrhea Cardiovascular: Denies chest pain. Respiratory: Denies shortness of breath. Gastrointestinal: No abdominal pain.  No nausea, no vomiting.  No diarrhea.  No constipation. Genitourinary: Negative for  dysuria. Musculoskeletal: Negative for back pain. Skin: Redness, swelling, and drainage from the right knee. Neurological: Negative for headaches, focal weakness or numbness.  10-point ROS otherwise negative.  ____________________________________________   PHYSICAL EXAM:  VITAL SIGNS: ED Triage Vitals  Enc Vitals Group     BP 03/30/15 0852 132/68 mmHg     Pulse Rate 03/30/15 0852 65     Resp 03/30/15 0852 18     Temp 03/30/15 0852 97.8 F (36.6 C)     Temp Source 03/30/15 0852 Oral     SpO2 03/30/15 0852 100 %     Weight 03/30/15 0852 165 lb (74.844 kg)     Height 03/30/15 0852 5\' 6"  (1.676 m)     Head Cir --      Peak Flow --      Pain Score 03/30/15 0853 8     Pain Loc --      Pain Edu? --      Excl. in [place]? --     Constitutional: Alert and oriented. Well appearing and in no acute distress. Eyes: Conjunctivae are normal. PERRL. EOMI. Head: Atraumatic. Nose: No congestion/rhinnorhea. Mouth/Throat: Mucous membranes are moist.  Oropharynx non-erythematous. No oral lesions. Neck: No stridor. Cardiovascular: Normal rate, regular rhythm.  Good peripheral circulation. Respiratory: Normal respiratory effort.  No retractions. Lungs CTAB. Gastrointestinal: Soft and nontender. No distention. No abdominal bruits.  Musculoskeletal: No lower extremity tenderness nor edema.  No joint effusions. Range of motion of right knee is limited due to pain. Neurologic:  Normal speech and language. No gross focal neurologic deficits are appreciated. Speech is normal. No gait instability. Skin:  Erythematous, swollen, purulent drainage noted from the anterior aspect of the right knee above the patella. Psychiatric: Mood and affect are normal. Speech and behavior are normal.  ____________________________________________   LABS (all labs ordered are listed, but only abnormal results are displayed)  Labs Reviewed  CBC WITH DIFFERENTIAL/PLATELET - Abnormal; Notable for the following:    Lymphs  Abs 0.8 (*)    All other components within normal limits  COMPREHENSIVE METABOLIC PANEL - Abnormal; Notable for the following:    Glucose, Bld 101 (*)    All other components within normal limits  CULTURE, BLOOD (ROUTINE X 2)  CULTURE, BLOOD (ROUTINE X 2)   ____________________________________________  EKG   ____________________________________________  RADIOLOGY   ____________________________________________  PROCEDURES  Procedure(s) performed:  Wound cleaned and dressed with antibiotic ointment. ____________________________________________   INITIAL IMPRESSION / ASSESSMENT AND PLAN / ED COURSE  Pertinent labs & imaging results that were available during my care of the patient were reviewed by me and considered in my medical decision making (see chart for details).  Patient was advised to follow-up with her primary care provider on Monday. She was advised to return here for recheck if she is unable to schedule an appointment. She will be given a prescription for clindamycin. She was advised to return to emergency department sooner for symptoms that change or worsen or if she begins to run a  fever.  ____________________________________________   FINAL CLINICAL IMPRESSION(S) / ED DIAGNOSES  Final diagnoses:  Cellulitis of knee, right      Victorino Dike, FNP 03/30/15 1532  Lavonia Drafts, MD 04/01/15 [phone]

## 2015-03-30 NOTE — ED Notes (Signed)
Pt has wound noted to right knee with redness and swelling.

## 2015-03-30 NOTE — ED Notes (Signed)
Pt cut right upper leg 2 weeks ago with a "ninja" blade, wound has scab with yellow drainage

## 2015-04-01 ENCOUNTER — Ambulatory Visit (INDEPENDENT_AMBULATORY_CARE_PROVIDER_SITE_OTHER)
Admission: RE | Admit: 2015-04-01 | Discharge: 2015-04-01 | Disposition: A | Discharge status: Home / Self Care | Payer: PPO | Source: Ambulatory Visit | Attending: Internal Medicine | Admitting: Internal Medicine

## 2015-04-01 ENCOUNTER — Encounter: Payer: Self-pay | Admitting: Internal Medicine

## 2015-04-01 ENCOUNTER — Ambulatory Visit (INDEPENDENT_AMBULATORY_CARE_PROVIDER_SITE_OTHER): Payer: PPO | Admitting: Internal Medicine

## 2015-04-01 ENCOUNTER — Other Ambulatory Visit: Payer: Self-pay | Admitting: Internal Medicine

## 2015-04-01 ENCOUNTER — Telehealth: Payer: Self-pay

## 2015-04-01 ENCOUNTER — Institutional Professional Consult (permissible substitution): Payer: PPO | Admitting: Internal Medicine

## 2015-04-01 VITALS — BP 118/74 | HR 65 | Temp 97.9°F | Wt 167.0 lb

## 2015-04-01 DIAGNOSIS — M25561 Pain in right knee: Secondary | ICD-10-CM | POA: Diagnosis not present

## 2015-04-01 DIAGNOSIS — S81011D Laceration without foreign body, right knee, subsequent encounter: Secondary | ICD-10-CM

## 2015-04-01 DIAGNOSIS — L03115 Cellulitis of right lower limb: Secondary | ICD-10-CM

## 2015-04-01 MED ORDER — TRAMADOL HCL 50 MG PO TABS: 50.0000 mg | ORAL_TABLET | Freq: Three times a day (TID) | ORAL | Status: DC | PRN

## 2015-04-01 NOTE — Telephone Encounter (Signed)
Pt seen  [facility] ED on 03/30/15 and has appt with Webb Silversmith NP on 04/01/15 at 10 AM.

## 2015-04-01 NOTE — Telephone Encounter (Signed)
[facility] came to [facility] to discuss need for new CPAP to be ordered.  Please call patient.

## 2015-04-01 NOTE — Telephone Encounter (Signed)
lmtcb X1 for Melissa.  

## 2015-04-01 NOTE — Telephone Encounter (Signed)
The patient's CPAP broke only 7 months ago, not 1 year ago as indicated in your message. She also had a sleep study in 2012, I would think that since she has not been off cpap for 1 year we would be able to get another machine without another sleep study? Thanks.  -DR.

## 2015-04-01 NOTE — Telephone Encounter (Signed)
Called spoke with pt and made her aware we are waiting on a call back from [facility].  Pt reports she received a letter from her insurance stating CPAP would be approved.  Awaiting on call back from [facility].
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
## 2015-04-01 NOTE — Patient Instructions (Signed)

## 2015-04-01 NOTE — Telephone Encounter (Signed)
PR - patient needs new sleep study per [facility], please advise if you want home study?

## 2015-04-01 NOTE — Progress Notes (Signed)
Subjective:    Patient ID: Jill Snow, female    DOB: [birth year], 81 y.o.   MRN: [id number]  HPI  Pt presents to the clinic today with c/o ER followup. She went to [facility] 03/30/15 with c/o right knee pain and swelling. This actually started 2 weeks prior after she accidentally stabbed a blade from a ninja food processor into her knee. Over the last week, she started to notice warmth and drainage from the knee. She denies fever, chills or body aches. They diagnosed her with cellulitis of the right knee and put her on Clindamycin. She has only taken 4 doses of the medication. She does feel like the right knee is less warm but it is still red and tender.  Review of Systems      Past Medical History  Diagnosis Date  . PAF (paroxysmal atrial fibrillation)     a. initial episode 2011; b. not on long term anticoagulation since 06/2013  . Hypertension   . Hypercholesterolemia   . Mitral regurgitation     a. echo 2014: EF 40-45%, mildly dilated RV, mod reduced RV systolic fxn, mod dilated LA, Mild to mod MR, mod TR, mildly elevated PASP  . Dyspnea   . Seasonal allergies   . Sleep apnea     a. on CPAP  . RBBB (right bundle branch block)   . History of chicken pox   . Atrial flutter     a. s/p successful TEE/DCCV in 2014; b. TEE 2014 showed EF 45-50%, mild bi-atrial enlargement, mod MR  . Atrial fibrillation     Current Outpatient Prescriptions  Medication Sig Dispense Refill  . ALPRAZolam (XANAX) 0.25 MG tablet TAKE ONE TABLET AT BEDTIME AS NEEDED 30 tablet 0  . amiodarone (PACERONE) 200 MG tablet TAKE ONE TABLET BY MOUTH EVERY DAY 30 tablet 3  . clindamycin (CLEOCIN) 300 MG capsule Take 1 capsule (300 mg total) by mouth 3 (three) times daily. 30 capsule 0  . fexofenadine (ALLEGRA) 180 MG tablet Take 1 tablet (180 mg total) by mouth daily. 30 tablet 2  . fluticasone (FLONASE) 50 MCG/ACT nasal spray TAKE 2 SPRAYS IN EACH NOSTRIL EVERY DAY 16 g 5  . lisinopril (PRINIVIL,ZESTRIL) 10  MG tablet Take 10 mg by mouth daily.    . montelukast (SINGULAIR) 10 MG tablet Take 10 mg by mouth at bedtime.    . simvastatin (ZOCOR) 40 MG tablet Take 1 tablet (40 mg total) by mouth at bedtime. 30 tablet 2  . Vitamin D, Ergocalciferol, (DRISDOL) 50000 UNITS CAPS capsule Take 1 capsule (50,000 Units total) by mouth every 7 (seven) days. 12 capsule 3  . zolpidem (AMBIEN) 5 MG tablet TAKE ONE TABLET BY MOUTH AT BEDTIME AS NEEDED 30 tablet 0   No current facility-administered medications for this visit.    Allergies  Allergen Reactions  . Codeine Anaphylaxis    "cant remember reaction"  . Augmentin [Amoxicillin-Pot Clavulanate] Nausea And Vomiting  . Crestor [Rosuvastatin] Other (See Comments)    Muscle pain/cramps     Family History  Problem Relation Age of Onset  . Stroke Mother   . Heart attack Mother   . Arthritis Mother   . Hyperlipidemia Mother   . Heart disease Mother   . Diabetes Mother   . Diabetes Sister   . Cancer Daughter   . Heart disease Daughter     Social History   Social History  . Marital Status: Widowed    Spouse Name: N/A  .  Number of Children: 2  . Years of Education: N/A   Occupational History  . REALTOR    Social History Main Topics  . Smoking status: Never Smoker   . Smokeless tobacco: Never Used  . Alcohol Use: No  . Drug Use: No  . Sexual Activity: No   Other Topics Concern  . Not on file   Social History Narrative     Constitutional: Denies fever, malaise, fatigue, headache or abrupt weight changes.  Respiratory: Denies difficulty breathing, shortness of breath, cough or sputum production.   Cardiovascular: Denies chest pain, chest tightness, palpitations or swelling in the hands or feet.  Skin: Pt reports stab wound to right knee with redness. Denies rashes, lesions or ulcercations.  Neurological: Denies dizziness, difficulty with memory, difficulty with speech or problems with balance and coordination.   No other specific  complaints in a complete review of systems (except as listed in HPI above).  Objective:   Physical Exam   BP 118/74 mmHg  Pulse 65  Temp(Src) 97.9 F (36.6 C) (Oral)  Wt 167 lb (75.751 kg)  SpO2 99% Wt Readings from Last 3 Encounters:  04/01/15 167 lb (75.751 kg)  03/30/15 165 lb (74.844 kg)  03/22/15 165 lb (74.844 kg)    General: Appears her stated age, well developed, well nourished in NAD. Skin: 1 inch laceration to right knee, draining serous fluid. Surrounding cellulitis noted, no streaking. Cardiovascular: Normal rate and rhythm. S1,S2 noted.  No murmur, rubs or gallops noted.  Pulmonary/Chest: Normal effort and positive vesicular breath sounds. No respiratory distress. No wheezes, rales or ronchi noted.  Musculoskeletal: Decreased flexion and extension of the right knee.  Neurological: Alert and oriented.   BMET    Component Value Date/Time   NA 139 03/30/2015 1038   NA 139 05/15/2013 0514   K 3.9 03/30/2015 1038   K 3.7 05/15/2013 0514   CL 106 03/30/2015 1038   CL 105 05/15/2013 0514   CO2 27 03/30/2015 1038   CO2 30 05/15/2013 0514   GLUCOSE 101* 03/30/2015 1038   GLUCOSE 92 05/15/2013 0514   BUN 12 03/30/2015 1038   BUN 11 05/15/2013 0514   CREATININE 0.71 03/30/2015 1038   CREATININE 1.25 05/15/2013 0514   CALCIUM 9.6 03/30/2015 1038   CALCIUM 9.5 05/15/2013 0514   GFRNONAA >60 03/30/2015 1038   GFRNONAA 41* 05/15/2013 0514   GFRAA >60 03/30/2015 1038   GFRAA 48* 05/15/2013 0514    Lipid Panel     Component Value Date/Time   CHOL 230* 01/07/2015 0925   CHOL 281* 03/07/2014 0832   TRIG 165.0* 01/07/2015 0925   HDL 44.40 01/07/2015 0925   HDL 49 03/07/2014 0832   CHOLHDL 5 01/07/2015 0925   CHOLHDL 5.7* 03/07/2014 0832   VLDL 33.0 01/07/2015 0925   LDLCALC 153* 01/07/2015 0925   LDLCALC 202* 03/07/2014 0832    CBC    Component Value Date/Time   WBC 7.4 03/30/2015 1038   WBC 4.9 05/15/2013 0514   RBC 4.44 03/30/2015 1038   RBC 4.73  05/15/2013 0514   HGB 14.4 03/30/2015 1038   HGB 14.6 05/15/2013 0514   HCT 41.7 03/30/2015 1038   HCT 43.6 05/15/2013 0514   PLT 164 03/30/2015 1038   PLT 171 05/15/2013 0514   MCV 94.0 03/30/2015 1038   MCV 92 05/15/2013 0514   MCH 32.4 03/30/2015 1038   MCH 30.9 05/15/2013 0514   MCHC 34.4 03/30/2015 1038   MCHC 33.5 05/15/2013 0514  RDW 12.9 03/30/2015 1038   RDW 13.9 05/15/2013 0514   LYMPHSABS 0.8* 03/30/2015 1038   LYMPHSABS 1.5 05/15/2013 0514   MONOABS 0.8 03/30/2015 1038   MONOABS 0.4 05/15/2013 0514   EOSABS 0.2 03/30/2015 1038   EOSABS 0.3 05/15/2013 0514   BASOSABS 0.1 03/30/2015 1038   BASOSABS 0.1 05/15/2013 0514    Hgb A1C No results found for: HGBA1C      Assessment & Plan:   ER followup for right knee cellulitis:  Hospital notes and labs reviewed We will check xray of right knee today Continue Clindamycin as prescribed RX for Tramadol for severe pain Watch for increased redness, pain, swelling, fever or chills  RTC as needed or if symptoms persist or worsen

## 2015-04-01 NOTE — Progress Notes (Signed)
Pre visit review using our clinic review tool, if applicable. No additional management support is needed unless otherwise documented below in the visit note. 

## 2015-04-02 NOTE — Telephone Encounter (Signed)
I have sent a staff message to [place]. Will await call

## 2015-04-02 NOTE — Telephone Encounter (Signed)
Per Melissa with [facility]: Unfortunately, MCR considers it a break in therapy after 30 days of no use. So, if she has not been using for 7 months, it is considered a break in therapy. Unfortunately, she will have to start the process all over again.     This info should be on the laminated CMS guidelines that I left for each pod a while back.  ---  Please advise thanks

## 2015-04-04 ENCOUNTER — Telehealth: Payer: Self-pay | Admitting: Internal Medicine

## 2015-04-04 LAB — CULTURE, BLOOD (ROUTINE X 2)
Culture: NO GROWTH
Culture: NO GROWTH

## 2015-04-04 NOTE — Telephone Encounter (Signed)
error 

## 2015-04-04 NOTE — Telephone Encounter (Signed)
Spoke with pt. States that she does not want to a HST. Would prefer doing this a sleep lab. According to [facility], she will have to another sleep study in order to get a CPAP.  Dr. Ashby Dawes - please advise if we can order an in lab sleep study and what kind >> split night? Thanks.

## 2015-04-04 NOTE — Telephone Encounter (Signed)
Called pt and LMTCB x1 to make aware per Dr. Ashby Dawes pt needs a HST

## 2015-04-05 NOTE — Telephone Encounter (Signed)
Yes, please send for split night study, thanks.

## 2015-04-05 NOTE — Telephone Encounter (Signed)
LM x 1 for pt to make aware.

## 2015-04-08 ENCOUNTER — Telehealth: Payer: Self-pay | Admitting: Internal Medicine

## 2015-04-08 DIAGNOSIS — G4733 Obstructive sleep apnea (adult) (pediatric): Secondary | ICD-10-CM

## 2015-04-08 DIAGNOSIS — Z9989 Dependence on other enabling machines and devices: Principal | ICD-10-CM

## 2015-04-08 NOTE — Telephone Encounter (Signed)
Order placed for split night as seen below.  Pt aware. Nothing further needed.   Laverle Hobby, MD at 04/05/2015 1:54 PM     Status: Signed       Expand All Collapse All   Yes, please send for split night study, thanks.

## 2015-04-10 ENCOUNTER — Telehealth: Payer: Self-pay | Admitting: Internal Medicine

## 2015-04-11 NOTE — Telephone Encounter (Signed)
Per phone note on 8/18 it looks like Jill Snow was calling patient to make aware that a sleep study in the lab has been ordered. In 8/22 phone note pt was made aware of sleep study.    Spoke with pt, she is aware of sleep study being ordered.  Nothing further needed.

## 2015-04-18 ENCOUNTER — Ambulatory Visit (INDEPENDENT_AMBULATORY_CARE_PROVIDER_SITE_OTHER): Payer: PPO | Admitting: Internal Medicine

## 2015-04-18 ENCOUNTER — Encounter: Payer: Self-pay | Admitting: Internal Medicine

## 2015-04-18 VITALS — BP 120/78 | HR 86 | Temp 97.8°F | Wt 163.0 lb

## 2015-04-18 DIAGNOSIS — R1033 Periumbilical pain: Secondary | ICD-10-CM

## 2015-04-18 NOTE — Progress Notes (Signed)
Pre visit review using our clinic review tool, if applicable. No additional management support is needed unless otherwise documented below in the visit note. 

## 2015-04-18 NOTE — Patient Instructions (Signed)

## 2015-04-18 NOTE — Progress Notes (Signed)
Subjective:    Patient ID: Jill Snow, female    DOB: Jun 09, 1934, 81 y.o.   MRN: [id number]  HPI  Pt presents to the clinic today with c/o abdominal pain. This started 5 days ago. The pain is located in her umbilical region. She describes the pain as throbbing. She also feels like there is some swelling in the area as well. She denies nausea, vomiting, diarrhea or reflux. She has been a little constipated over the last 2 days but reports she has been passing gas. Her appetite has been decreased. She has not tried anything OTC. She did lift a box about 1 week ago and does feel like the pain started after this. She does have a history of an umbilical hernia that she has had repaired and she feels like this pain is similar to that.  Review of Systems      Past Medical History  Diagnosis Date  . PAF (paroxysmal atrial fibrillation)     a. initial episode 2011; b. not on long term anticoagulation since 06/2013  . Hypertension   . Hypercholesterolemia   . Mitral regurgitation     a. echo 2014: EF 40-45%, mildly dilated RV, mod reduced RV systolic fxn, mod dilated LA, Mild to mod MR, mod TR, mildly elevated PASP  . Dyspnea   . Seasonal allergies   . Sleep apnea     a. on CPAP  . RBBB (right bundle branch block)   . History of chicken pox   . Atrial flutter     a. s/p successful TEE/DCCV in 2014; b. TEE 2014 showed EF 45-50%, mild bi-atrial enlargement, mod MR  . Atrial fibrillation     Current Outpatient Prescriptions  Medication Sig Dispense Refill  . ALPRAZolam (XANAX) 0.25 MG tablet TAKE ONE TABLET AT BEDTIME AS NEEDED 30 tablet 0  . amiodarone (PACERONE) 200 MG tablet TAKE ONE TABLET BY MOUTH EVERY DAY 30 tablet 3  . clindamycin (CLEOCIN) 300 MG capsule Take 1 capsule (300 mg total) by mouth 3 (three) times daily. 30 capsule 0  . fexofenadine (ALLEGRA) 180 MG tablet Take 1 tablet (180 mg total) by mouth daily. 30 tablet 2  . fluticasone (FLONASE) 50 MCG/ACT nasal spray  TAKE 2 SPRAYS IN EACH NOSTRIL EVERY DAY 16 g 5  . lisinopril (PRINIVIL,ZESTRIL) 10 MG tablet TAKE ONE TABLET EVERY DAY 30 tablet 3  . montelukast (SINGULAIR) 10 MG tablet TAKE ONE TABLET EVERY DAY 30 tablet 5  . simvastatin (ZOCOR) 40 MG tablet Take 1 tablet (40 mg total) by mouth at bedtime. 30 tablet 2  . traMADol (ULTRAM) 50 MG tablet Take 1 tablet (50 mg total) by mouth every 8 (eight) hours as needed. 30 tablet 0  . Vitamin D, Ergocalciferol, (DRISDOL) 50000 UNITS CAPS capsule Take 1 capsule (50,000 Units total) by mouth every 7 (seven) days. 12 capsule 3  . zolpidem (AMBIEN) 5 MG tablet TAKE ONE TABLET BY MOUTH AT BEDTIME AS NEEDED 30 tablet 0   No current facility-administered medications for this visit.    Allergies  Allergen Reactions  . Codeine Anaphylaxis    "cant remember reaction"  . Augmentin [Amoxicillin-Pot Clavulanate] Nausea And Vomiting  . Crestor [Rosuvastatin] Other (See Comments)    Muscle pain/cramps     Family History  Problem Relation Age of Onset  . Stroke Mother   . Heart attack Mother   . Arthritis Mother   . Hyperlipidemia Mother   . Heart disease Mother   . Diabetes  Mother   . Diabetes Sister   . Cancer Daughter   . Heart disease Daughter     Social History   Social History  . Marital Status: Widowed    Spouse Name: N/A  . Number of Children: 2  . Years of Education: N/A   Occupational History  . REALTOR    Social History Main Topics  . Smoking status: Never Smoker   . Smokeless tobacco: Never Used  . Alcohol Use: No  . Drug Use: No  . Sexual Activity: No   Other Topics Concern  . Not on file   Social History Narrative     Constitutional: Denies fever, malaise, fatigue, headache or abrupt weight changes.  Respiratory: Denies difficulty breathing, shortness of breath, cough or sputum production.   Cardiovascular: Denies chest pain, chest tightness, palpitations or swelling in the hands or feet.  Gastrointestinal: Pt reports  abdominal pain and constipation. Denies diarrhea or blood in the stool.  GU: Denies urgency, frequency, pain with urination, burning sensation, blood in urine, odor or discharge.  No other specific complaints in a complete review of systems (except as listed in HPI above).   Objective:   Physical Exam    BP 120/78 mmHg  Pulse 86  Temp(Src) 97.8 F (36.6 C) (Oral)  Wt 163 lb (73.936 kg)  SpO2 98% Wt Readings from Last 3 Encounters:  04/18/15 163 lb (73.936 kg)  04/01/15 167 lb (75.751 kg)  03/30/15 165 lb (74.844 kg)    General: Appears her stated age, well developed, well nourished in NAD. Cardiovascular: Normal rate and rhythm. S1,S2 noted.  No murmur, rubs or gallops noted.  Pulmonary/Chest: Normal effort and positive vesicular breath sounds. No respiratory distress. No wheezes, rales or ronchi noted.  Abdomen: Soft and tender over the umbilicus. Normal bowel sounds, no bruits noted. No distention or masses noted.  Neurological: Alert and oriented.   BMET    Component Value Date/Time   NA 139 03/30/2015 1038   NA 139 05/15/2013 0514   K 3.9 03/30/2015 1038   K 3.7 05/15/2013 0514   CL 106 03/30/2015 1038   CL 105 05/15/2013 0514   CO2 27 03/30/2015 1038   CO2 30 05/15/2013 0514   GLUCOSE 101* 03/30/2015 1038   GLUCOSE 92 05/15/2013 0514   BUN 12 03/30/2015 1038   BUN 11 05/15/2013 0514   CREATININE 0.71 03/30/2015 1038   CREATININE 1.25 05/15/2013 0514   CALCIUM 9.6 03/30/2015 1038   CALCIUM 9.5 05/15/2013 0514   GFRNONAA >60 03/30/2015 1038   GFRNONAA 41* 05/15/2013 0514   GFRAA >60 03/30/2015 1038   GFRAA 48* 05/15/2013 0514    Lipid Panel     Component Value Date/Time   CHOL 230* 01/07/2015 0925   CHOL 281* 03/07/2014 0832   TRIG 165.0* 01/07/2015 0925   HDL 44.40 01/07/2015 0925   HDL 49 03/07/2014 0832   CHOLHDL 5 01/07/2015 0925   CHOLHDL 5.7* 03/07/2014 0832   VLDL 33.0 01/07/2015 0925   LDLCALC 153* 01/07/2015 0925   LDLCALC 202* 03/07/2014  0832    CBC    Component Value Date/Time   WBC 7.4 03/30/2015 1038   WBC 4.9 05/15/2013 0514   RBC 4.44 03/30/2015 1038   RBC 4.73 05/15/2013 0514   HGB 14.4 03/30/2015 1038   HGB 14.6 05/15/2013 0514   HCT 41.7 03/30/2015 1038   HCT 43.6 05/15/2013 0514   PLT 164 03/30/2015 1038   PLT 171 05/15/2013 0514   MCV 94.0  03/30/2015 1038   MCV 92 05/15/2013 0514   MCH 32.4 03/30/2015 1038   MCH 30.9 05/15/2013 0514   MCHC 34.4 03/30/2015 1038   MCHC 33.5 05/15/2013 0514   RDW 12.9 03/30/2015 1038   RDW 13.9 05/15/2013 0514   LYMPHSABS 0.8* 03/30/2015 1038   LYMPHSABS 1.5 05/15/2013 0514   MONOABS 0.8 03/30/2015 1038   MONOABS 0.4 05/15/2013 0514   EOSABS 0.2 03/30/2015 1038   EOSABS 0.3 05/15/2013 0514   BASOSABS 0.1 03/30/2015 1038   BASOSABS 0.1 05/15/2013 0514    Hgb A1C No results found for: HGBA1C     Assessment & Plan:   Umbilical abdominal pain:  It could be a reoccurrence of her hernia She is not interested in CT scan for evaluation at this time Discussed splinting her abdomen when coughing or straining with BM Discussed red flags- she understands to call me if she notices a bulge that doesn't retract, worsening pain, nausea with vomiting and black or bloody stools Advised her to drink fluids and try to eat something to try to produce a BM  RTC as needed, if pain worsens or you have bloody stools over the weekend, go to the ER.

## 2015-04-23 ENCOUNTER — Ambulatory Visit: Payer: PPO | Attending: Pulmonary Disease

## 2015-04-23 DIAGNOSIS — I10 Essential (primary) hypertension: Secondary | ICD-10-CM | POA: Insufficient documentation

## 2015-04-23 DIAGNOSIS — I4891 Unspecified atrial fibrillation: Secondary | ICD-10-CM | POA: Insufficient documentation

## 2015-04-23 DIAGNOSIS — G4733 Obstructive sleep apnea (adult) (pediatric): Secondary | ICD-10-CM | POA: Insufficient documentation

## 2015-04-25 ENCOUNTER — Encounter: Payer: Self-pay | Admitting: Internal Medicine

## 2015-04-25 ENCOUNTER — Ambulatory Visit (INDEPENDENT_AMBULATORY_CARE_PROVIDER_SITE_OTHER): Payer: PPO | Admitting: Internal Medicine

## 2015-04-25 VITALS — BP 116/64 | HR 61 | Temp 98.1°F | Wt 164.0 lb

## 2015-04-25 DIAGNOSIS — R358 Other polyuria: Secondary | ICD-10-CM | POA: Diagnosis not present

## 2015-04-25 DIAGNOSIS — R202 Paresthesia of skin: Secondary | ICD-10-CM | POA: Diagnosis not present

## 2015-04-25 DIAGNOSIS — R631 Polydipsia: Secondary | ICD-10-CM | POA: Diagnosis not present

## 2015-04-25 DIAGNOSIS — G4733 Obstructive sleep apnea (adult) (pediatric): Secondary | ICD-10-CM | POA: Diagnosis not present

## 2015-04-25 DIAGNOSIS — R3589 Other polyuria: Secondary | ICD-10-CM

## 2015-04-25 LAB — HEMOGLOBIN A1C: Hgb A1c MFr Bld: 5 % (ref 4.6–6.5)

## 2015-04-25 NOTE — Progress Notes (Signed)
Subjective:    Patient ID: Jill Snow, female    DOB: Jan 11, 1934, 81 y.o.   MRN: [id number]  HPI Jill Snow is a 81 year old female who presents today with chief complaint of numbness and tingling in her feet, especially at night.  This first started 3 weeks ago and she has tried to elevate her feet without relief.  She is concerned because she has a strong family history of diabetes.  She has also noted increased thirst and polyuria.  She has not taken anything OTC for the symptoms in her feet.     Review of Systems  Constitutional: Positive for fatigue. Negative for fever and chills.  HENT: Negative.   Respiratory: Negative for cough, shortness of breath and wheezing.   Cardiovascular: Negative for chest pain, palpitations and leg swelling.  Gastrointestinal: Negative.   Endocrine: Positive for polydipsia and polyuria. Negative for polyphagia.  Genitourinary: Negative for dysuria, urgency and frequency.  Musculoskeletal: Negative.   Skin: Negative.   Neurological: Negative for dizziness, light-headedness and headaches.   Family History  Problem Relation Age of Onset  . Stroke Mother   . Heart attack Mother   . Arthritis Mother   . Hyperlipidemia Mother   . Heart disease Mother   . Diabetes Mother   . Diabetes Sister   . Cancer Daughter   . Heart disease Daughter   . Diabetes Daughter   . Diabetes Brother   . Diabetes Son   . Diabetes Maternal Grandmother   . Diabetes Maternal Grandfather    Current Outpatient Prescriptions on File Prior to Visit  Medication Sig Dispense Refill  . ALPRAZolam (XANAX) 0.25 MG tablet TAKE ONE TABLET AT BEDTIME AS NEEDED 30 tablet 0  . amiodarone (PACERONE) 200 MG tablet TAKE ONE TABLET BY MOUTH EVERY DAY 30 tablet 3  . clindamycin (CLEOCIN) 300 MG capsule Take 1 capsule (300 mg total) by mouth 3 (three) times daily. 30 capsule 0  . fexofenadine (ALLEGRA) 180 MG tablet Take 1 tablet (180 mg total) by mouth daily. 30 tablet 2  .  fluticasone (FLONASE) 50 MCG/ACT nasal spray TAKE 2 SPRAYS IN EACH NOSTRIL EVERY DAY 16 g 5  . lisinopril (PRINIVIL,ZESTRIL) 10 MG tablet TAKE ONE TABLET EVERY DAY 30 tablet 3  . montelukast (SINGULAIR) 10 MG tablet TAKE ONE TABLET EVERY DAY 30 tablet 5  . simvastatin (ZOCOR) 40 MG tablet Take 1 tablet (40 mg total) by mouth at bedtime. 30 tablet 2  . traMADol (ULTRAM) 50 MG tablet Take 1 tablet (50 mg total) by mouth every 8 (eight) hours as needed. 30 tablet 0  . Vitamin D, Ergocalciferol, (DRISDOL) 50000 UNITS CAPS capsule Take 1 capsule (50,000 Units total) by mouth every 7 (seven) days. 12 capsule 3  . zolpidem (AMBIEN) 5 MG tablet TAKE ONE TABLET BY MOUTH AT BEDTIME AS NEEDED 30 tablet 0   No current facility-administered medications on file prior to visit.        Objective:   Physical Exam  Constitutional: She is oriented to person, place, and time. She appears well-developed and well-nourished.  HENT:  Head: Normocephalic and atraumatic.  Eyes: Pupils are equal, round, and reactive to light.  Neck: Normal range of motion. Neck supple.  Cardiovascular: Normal rate, regular rhythm and normal heart sounds.   Pulmonary/Chest: Effort normal and breath sounds normal.  Abdominal: Soft. Bowel sounds are normal.  Musculoskeletal: Normal range of motion.  Neurological: She is alert and oriented to person, place, and time.  Skin: Skin is warm and dry.          Assessment & Plan:  1. Numbness of feet - B12 was checked 4 months ago and was normal - Will check A1C  - Encouraged patient to elevate feet when she is sitting.

## 2015-04-25 NOTE — Progress Notes (Signed)
Subjective:    Patient ID: Jill Snow, female    DOB: June 02, 1934, 81 y.o.   MRN: [id number]  HPI  Pt presents to the clinic today with c/o numbness and tingling in her feet. This started about 3 weeks ago. She does report that her feet feel cold all the time. She has noticed increased thirst and frequent urination as well. She has a strong family history of diabetes and would like to get checked for that today. She has not tried anything OTC to improve her symptoms.  Review of Systems      Past Medical History  Diagnosis Date  . PAF (paroxysmal atrial fibrillation)     a. initial episode 2011; b. not on long term anticoagulation since 06/2013  . Hypertension   . Hypercholesterolemia   . Mitral regurgitation     a. echo 2014: EF 40-45%, mildly dilated RV, mod reduced RV systolic fxn, mod dilated LA, Mild to mod MR, mod TR, mildly elevated PASP  . Dyspnea   . Seasonal allergies   . Sleep apnea     a. on CPAP  . RBBB (right bundle branch block)   . History of chicken pox   . Atrial flutter     a. s/p successful TEE/DCCV in 2014; b. TEE 2014 showed EF 45-50%, mild bi-atrial enlargement, mod MR  . Atrial fibrillation     Current Outpatient Prescriptions  Medication Sig Dispense Refill  . ALPRAZolam (XANAX) 0.25 MG tablet TAKE ONE TABLET AT BEDTIME AS NEEDED 30 tablet 0  . amiodarone (PACERONE) 200 MG tablet TAKE ONE TABLET BY MOUTH EVERY DAY 30 tablet 3  . clindamycin (CLEOCIN) 300 MG capsule Take 1 capsule (300 mg total) by mouth 3 (three) times daily. 30 capsule 0  . fexofenadine (ALLEGRA) 180 MG tablet Take 1 tablet (180 mg total) by mouth daily. 30 tablet 2  . fluticasone (FLONASE) 50 MCG/ACT nasal spray TAKE 2 SPRAYS IN EACH NOSTRIL EVERY DAY 16 g 5  . lisinopril (PRINIVIL,ZESTRIL) 10 MG tablet TAKE ONE TABLET EVERY DAY 30 tablet 3  . montelukast (SINGULAIR) 10 MG tablet TAKE ONE TABLET EVERY DAY 30 tablet 5  . simvastatin (ZOCOR) 40 MG tablet Take 1 tablet (40 mg  total) by mouth at bedtime. 30 tablet 2  . traMADol (ULTRAM) 50 MG tablet Take 1 tablet (50 mg total) by mouth every 8 (eight) hours as needed. 30 tablet 0  . Vitamin D, Ergocalciferol, (DRISDOL) 50000 UNITS CAPS capsule Take 1 capsule (50,000 Units total) by mouth every 7 (seven) days. 12 capsule 3  . zolpidem (AMBIEN) 5 MG tablet TAKE ONE TABLET BY MOUTH AT BEDTIME AS NEEDED 30 tablet 0   No current facility-administered medications for this visit.    Allergies  Allergen Reactions  . Codeine Anaphylaxis    "cant remember reaction"  . Augmentin [Amoxicillin-Pot Clavulanate] Nausea And Vomiting  . Crestor [Rosuvastatin] Other (See Comments)    Muscle pain/cramps     Family History  Problem Relation Age of Onset  . Stroke Mother   . Heart attack Mother   . Arthritis Mother   . Hyperlipidemia Mother   . Heart disease Mother   . Diabetes Mother   . Diabetes Sister   . Cancer Daughter   . Heart disease Daughter     Social History   Social History  . Marital Status: Widowed    Spouse Name: N/A  . Number of Children: 2  . Years of Education: N/A  Occupational History  . REALTOR    Social History Main Topics  . Smoking status: Never Smoker   . Smokeless tobacco: Never Used  . Alcohol Use: No  . Drug Use: No  . Sexual Activity: No   Other Topics Concern  . Not on file   Social History Narrative     Constitutional: Denies fever, malaise, fatigue, headache or abrupt weight changes.  Respiratory: Denies difficulty breathing, shortness of breath, cough or sputum production.   Cardiovascular: Denies chest pain, chest tightness, palpitations or swelling in the hands or feet.  Musculoskeletal: Denies decrease in range of motion, difficulty with gait, muscle pain or joint pain and swelling.  Skin: Denies redness, rashes, lesions or ulcercations.  Neurological: Pt reports numbness in her feet. Denies dizziness, difficulty with memory, difficulty with speech or problems  with balance and coordination.    No other specific complaints in a complete review of systems (except as listed in HPI above).  Objective:   Physical Exam   BP 116/64 mmHg  Pulse 61  Temp(Src) 98.1 F (36.7 C) (Oral)  Wt 164 lb (74.39 kg)  SpO2 98% Wt Readings from Last 3 Encounters:  04/25/15 164 lb (74.39 kg)  04/18/15 163 lb (73.936 kg)  04/01/15 167 lb (75.751 kg)    General: Appears her stated age, in NAD. Skin: Warm, dry and intact. No rashes, lesions or ulcerations noted. Cardiovascular: Normal rate and rhythm. S1,S2 noted.  No murmur, rubs or gallops noted. Pedal pulses 2+. Pulmonary/Chest: Normal effort and positive vesicular breath sounds. No respiratory distress. No wheezes, rales or ronchi noted.  Musculoskeletal: Normal flexion, extension and rotation of bilateral lower extremities. No signs of joint swelling. No difficulty with gait.  Neurological: Alert and oriented. Sensation intact to BLE.   BMET    Component Value Date/Time   NA 139 03/30/2015 1038   NA 139 05/15/2013 0514   K 3.9 03/30/2015 1038   K 3.7 05/15/2013 0514   CL 106 03/30/2015 1038   CL 105 05/15/2013 0514   CO2 27 03/30/2015 1038   CO2 30 05/15/2013 0514   GLUCOSE 101* 03/30/2015 1038   GLUCOSE 92 05/15/2013 0514   BUN 12 03/30/2015 1038   BUN 11 05/15/2013 0514   CREATININE 0.71 03/30/2015 1038   CREATININE 1.25 05/15/2013 0514   CALCIUM 9.6 03/30/2015 1038   CALCIUM 9.5 05/15/2013 0514   GFRNONAA >60 03/30/2015 1038   GFRNONAA 41* 05/15/2013 0514   GFRAA >60 03/30/2015 1038   GFRAA 48* 05/15/2013 0514    Lipid Panel     Component Value Date/Time   CHOL 230* 01/07/2015 0925   CHOL 281* 03/07/2014 0832   TRIG 165.0* 01/07/2015 0925   HDL 44.40 01/07/2015 0925   HDL 49 03/07/2014 0832   CHOLHDL 5 01/07/2015 0925   CHOLHDL 5.7* 03/07/2014 0832   VLDL 33.0 01/07/2015 0925   LDLCALC 153* 01/07/2015 0925   LDLCALC 202* 03/07/2014 0832    CBC    Component Value  Date/Time   WBC 7.4 03/30/2015 1038   WBC 4.9 05/15/2013 0514   RBC 4.44 03/30/2015 1038   RBC 4.73 05/15/2013 0514   HGB 14.4 03/30/2015 1038   HGB 14.6 05/15/2013 0514   HCT 41.7 03/30/2015 1038   HCT 43.6 05/15/2013 0514   PLT 164 03/30/2015 1038   PLT 171 05/15/2013 0514   MCV 94.0 03/30/2015 1038   MCV 92 05/15/2013 0514   MCH 32.4 03/30/2015 1038   MCH 30.9 05/15/2013 0514  MCHC 34.4 03/30/2015 1038   MCHC 33.5 05/15/2013 0514   RDW 12.9 03/30/2015 1038   RDW 13.9 05/15/2013 0514   LYMPHSABS 0.8* 03/30/2015 1038   LYMPHSABS 1.5 05/15/2013 0514   MONOABS 0.8 03/30/2015 1038   MONOABS 0.4 05/15/2013 0514   EOSABS 0.2 03/30/2015 1038   EOSABS 0.3 05/15/2013 0514   BASOSABS 0.1 03/30/2015 1038   BASOSABS 0.1 05/15/2013 0514    Hgb A1C No results found for: HGBA1C      Assessment & Plan:   Paresthesia of BLE:  ? Vascular versus nerve related Will check A1C today We recently checked a B12 12/2014 which was normal She will keep her feet elevated and wear socks if she feels like her feet are cold  Will follow up after labs are back, RTC as needed

## 2015-04-25 NOTE — Progress Notes (Signed)
Pre visit review using our clinic review tool, if applicable. No additional management support is needed unless otherwise documented below in the visit note. 

## 2015-04-25 NOTE — Patient Instructions (Signed)

## 2015-04-30 ENCOUNTER — Telehealth: Payer: Self-pay | Admitting: Endocrinology

## 2015-04-30 ENCOUNTER — Telehealth: Payer: Self-pay | Admitting: Internal Medicine

## 2015-04-30 DIAGNOSIS — G4733 Obstructive sleep apnea (adult) (pediatric): Secondary | ICD-10-CM

## 2015-04-30 NOTE — Telephone Encounter (Signed)
Patient wants sleeps study results .   Please call.

## 2015-04-30 NOTE — Telephone Encounter (Signed)
error 

## 2015-04-30 NOTE — Telephone Encounter (Signed)
Pt calling request sleep study results. Please advise. Results under procedures. Thanks

## 2015-05-01 NOTE — Telephone Encounter (Signed)
  Her test showed moderate sleep apnea with an average of about 20 apnea events per hour of sleep. She needs to be ideally sent for a cpap titration study. If she does not want to go for another sleep study we could get away with a home  cpap titration study.

## 2015-05-02 NOTE — Telephone Encounter (Signed)
Spoke with pt and gave results. Order placed for CPAP titration. Nothing further needed.

## 2015-05-10 ENCOUNTER — Telehealth: Payer: Self-pay | Admitting: *Deleted

## 2015-05-10 DIAGNOSIS — G4733 Obstructive sleep apnea (adult) (pediatric): Secondary | ICD-10-CM

## 2015-05-10 NOTE — Telephone Encounter (Signed)
CPAP titration study ordered per DR.

## 2015-05-14 ENCOUNTER — Ambulatory Visit: Payer: PPO | Attending: Pulmonary Disease

## 2015-05-14 DIAGNOSIS — G4733 Obstructive sleep apnea (adult) (pediatric): Secondary | ICD-10-CM | POA: Diagnosis not present

## 2015-05-15 DIAGNOSIS — G4733 Obstructive sleep apnea (adult) (pediatric): Secondary | ICD-10-CM

## 2015-05-20 ENCOUNTER — Telehealth: Payer: Self-pay | Admitting: Internal Medicine

## 2015-05-20 NOTE — Telephone Encounter (Signed)
Spoke with pt, states that she still has not received her cpap machine.  Pt is calling to see what the holdup is.  Per epic it looks like her cpap was initially ordered in August through [facility]... Called [facility], states that they need the sleep study in order to process this order.   Sleep study needs to be faxed to [phone] attn: Sonia Baller.   This has been faxed.  Nothing further needed at this time.

## 2015-05-20 NOTE — Telephone Encounter (Signed)
lmtcb for pt.     Renelda Mom, LPN at 2/33/0076 22:63 AM     Status: Signed       Expand All Collapse All   Spoke with pt and gave results. Order placed for CPAP titration. Nothing further needed.            Laverle Hobby, MD at 05/01/2015 4:26 PM     Status: Signed       Expand All Collapse All    Her test showed moderate sleep apnea with an average of about 20 apnea events per hour of sleep. She needs to be ideally sent for a cpap titration study. If she does not want to go for another sleep study we could get away with a home cpap titration study.

## 2015-05-20 NOTE — Telephone Encounter (Signed)
Patient returned Elise's call.  Said we already had her phone number.

## 2015-05-22 ENCOUNTER — Telehealth: Payer: Self-pay | Admitting: *Deleted

## 2015-05-22 DIAGNOSIS — E559 Vitamin D deficiency, unspecified: Secondary | ICD-10-CM

## 2015-05-22 NOTE — Telephone Encounter (Signed)
  1. Which medications need to be refilled? Vitamin D (wants to take one everyday instead of once a week)  2. Which pharmacy is medication to be sent to? [facility]  3. Do they need a 30 day or 90 day supply? 90 day  4. Would they like a call back once the medication has been sent to the pharmacy? Yes please if no answer please lmom

## 2015-05-22 NOTE — Telephone Encounter (Signed)
Please advise pt requesting Vitamin D daily Rx originally prescribed weekly.. Please see note.

## 2015-05-22 NOTE — Telephone Encounter (Signed)
Would recommend patient with primary care

## 2015-05-23 NOTE — Telephone Encounter (Signed)
Spoke w/ pt.  Advised her of Dr. Donivan Scull recommendation.  She reports that she called her PCP and was advised that she is on the correct dose, so no changes need to be made.

## 2015-05-29 ENCOUNTER — Encounter: Payer: Self-pay | Admitting: Internal Medicine

## 2015-06-12 ENCOUNTER — Telehealth: Payer: Self-pay | Admitting: Internal Medicine

## 2015-06-12 NOTE — Telephone Encounter (Signed)
Pneumovax was done 2014---do not see Prevnar--please advise

## 2015-06-12 NOTE — Telephone Encounter (Signed)
Pt would like to know if she needs a flu shot and a pnemonia shot this year.  She can not remember if she has gotten them or not.   Pt also is requesting a rx for eye drops for her eyes for allergies  Please call back at [phone] Thank you

## 2015-06-13 NOTE — Telephone Encounter (Signed)
Ok to refill eye drops Give flu and prevnar

## 2015-06-13 NOTE — Telephone Encounter (Signed)
The eye drops must be OTC, ask her the name She can try Trazadone 25 mg QHS prn  #30, 0 refills if she wants

## 2015-06-13 NOTE — Telephone Encounter (Signed)
Pt is aware and is on nurse visit schedule---i mentioned to pt about the UDS---pt reports she would rather just stop Xanax at this time---pt states she has [place] but it makes her feel "crazy" pt wanted to know if there is something else she can try instead to help her with sleep..pt stated she had never tried Trazadone--also I did not see a Rx for eye drops----please advise

## 2015-06-14 ENCOUNTER — Ambulatory Visit (INDEPENDENT_AMBULATORY_CARE_PROVIDER_SITE_OTHER): Payer: PPO

## 2015-06-14 ENCOUNTER — Other Ambulatory Visit: Payer: Self-pay | Admitting: Internal Medicine

## 2015-06-14 DIAGNOSIS — Z23 Encounter for immunization: Secondary | ICD-10-CM | POA: Diagnosis not present

## 2015-06-14 MED ORDER — TRAZODONE HCL 50 MG PO TABS: 25.0000 mg | ORAL_TABLET | Freq: Every evening | ORAL | Status: DC | PRN

## 2015-06-14 NOTE — Telephone Encounter (Signed)
Pt came in for nurse visit--pt advised on OTC allergy eye drops and is okay with starting Trazadone--Rx sent to pharmacy

## 2015-06-17 ENCOUNTER — Other Ambulatory Visit: Payer: Self-pay | Admitting: Internal Medicine

## 2015-06-17 ENCOUNTER — Institutional Professional Consult (permissible substitution): Payer: PPO | Admitting: Internal Medicine

## 2015-06-25 ENCOUNTER — Other Ambulatory Visit: Payer: Self-pay | Admitting: Cardiovascular Disease

## 2015-07-02 ENCOUNTER — Telehealth: Payer: Self-pay | Admitting: Internal Medicine

## 2015-07-02 NOTE — Telephone Encounter (Signed)
Message has been forwarded to [facility] for follow though

## 2015-07-02 NOTE — Telephone Encounter (Signed)
Attempted to call pt back Line rang with no option of voicemail Will call pt back at a later time

## 2015-07-02 NOTE — Telephone Encounter (Signed)
Pt returning call.Jill Snow ° °

## 2015-07-03 NOTE — Telephone Encounter (Signed)
Spoke with the pt  She states that she had been having trouble with her mask leaking, but [facility] gave her tips on how to adjust it and she it is working properly now  She also had them up the humidity and is not bothered by dryness anymore  Nothing needed per pt so will close encounter

## 2015-07-05 ENCOUNTER — Ambulatory Visit: Payer: PPO | Admitting: Internal Medicine

## 2015-07-08 ENCOUNTER — Encounter: Payer: Self-pay | Admitting: Internal Medicine

## 2015-07-08 ENCOUNTER — Ambulatory Visit: Payer: PPO | Admitting: Internal Medicine

## 2015-07-08 ENCOUNTER — Ambulatory Visit (INDEPENDENT_AMBULATORY_CARE_PROVIDER_SITE_OTHER)
Admission: RE | Admit: 2015-07-08 | Discharge: 2015-07-08 | Disposition: A | Discharge status: Home / Self Care | Payer: PPO | Source: Ambulatory Visit | Attending: Internal Medicine | Admitting: Internal Medicine

## 2015-07-08 ENCOUNTER — Ambulatory Visit (INDEPENDENT_AMBULATORY_CARE_PROVIDER_SITE_OTHER): Payer: PPO | Admitting: Internal Medicine

## 2015-07-08 ENCOUNTER — Telehealth: Payer: Self-pay | Admitting: Internal Medicine

## 2015-07-08 VITALS — BP 128/74 | HR 66 | Temp 98.4°F | Wt 170.5 lb

## 2015-07-08 DIAGNOSIS — I10 Essential (primary) hypertension: Secondary | ICD-10-CM | POA: Diagnosis not present

## 2015-07-08 DIAGNOSIS — W19XXXA Unspecified fall, initial encounter: Secondary | ICD-10-CM

## 2015-07-08 DIAGNOSIS — Y92009 Unspecified place in unspecified non-institutional (private) residence as the place of occurrence of the external cause: Principal | ICD-10-CM

## 2015-07-08 DIAGNOSIS — G4733 Obstructive sleep apnea (adult) (pediatric): Secondary | ICD-10-CM

## 2015-07-08 DIAGNOSIS — Y92099 Unspecified place in other non-institutional residence as the place of occurrence of the external cause: Secondary | ICD-10-CM

## 2015-07-08 DIAGNOSIS — E785 Hyperlipidemia, unspecified: Secondary | ICD-10-CM

## 2015-07-08 DIAGNOSIS — M546 Pain in thoracic spine: Secondary | ICD-10-CM | POA: Diagnosis not present

## 2015-07-08 DIAGNOSIS — Z9989 Dependence on other enabling machines and devices: Secondary | ICD-10-CM

## 2015-07-08 DIAGNOSIS — K1379 Other lesions of oral mucosa: Secondary | ICD-10-CM

## 2015-07-08 DIAGNOSIS — J302 Other seasonal allergic rhinitis: Secondary | ICD-10-CM | POA: Insufficient documentation

## 2015-07-08 LAB — COMPREHENSIVE METABOLIC PANEL
ALBUMIN: 4.4 g/dL (ref 3.5–5.2)
ALK PHOS: 63 U/L (ref 39–117)
ALT: 31 U/L (ref 0–35)
AST: 39 U/L — ABNORMAL HIGH (ref 0–37)
BUN: 14 mg/dL (ref 6–23)
CALCIUM: 9.7 mg/dL (ref 8.4–10.5)
CO2: 27 mEq/L (ref 19–32)
Chloride: 104 mEq/L (ref 96–112)
Creatinine, Ser: 0.8 mg/dL (ref 0.40–1.20)
GFR: 73.42 mL/min (ref >60.00)
Glucose, Bld: 96 mg/dL (ref 70–99)
POTASSIUM: 3.9 meq/L (ref 3.5–5.1)
Sodium: 140 mEq/L (ref 135–145)
TOTAL PROTEIN: 7.1 g/dL (ref 6.0–8.3)
Total Bilirubin: 0.7 mg/dL (ref 0.2–1.2)

## 2015-07-08 LAB — LIPID PANEL
CHOLESTEROL: 267 mg/dL — AB (ref 0–200)
HDL: 55 mg/dL (ref >39.00)
LDL Cholesterol: 191 mg/dL — ABNORMAL HIGH (ref 0–99)
NonHDL: 212.36
TRIGLYCERIDES: 107 mg/dL (ref 0.0–149.0)
Total CHOL/HDL Ratio: 5
VLDL: 21.4 mg/dL (ref 0.0–40.0)

## 2015-07-08 MED ORDER — MAGIC MOUTHWASH W/LIDOCAINE: 5.0000 mL | Freq: Three times a day (TID) | ORAL | Status: DC | PRN

## 2015-07-08 NOTE — Telephone Encounter (Signed)
Ok, lets try the magic mouthwash first, if that does not work we can consider something else.

## 2015-07-08 NOTE — Progress Notes (Signed)
Pre visit review using our clinic review tool, if applicable. No additional management support is needed unless otherwise documented below in the visit note. 

## 2015-07-08 NOTE — Assessment & Plan Note (Addendum)
Encouraged her to consume a low fat diet Advised her to start taking a baby ASA daily to reduce stroke risk Will check Lipid Profile and CMET today

## 2015-07-08 NOTE — Patient Instructions (Signed)

## 2015-07-08 NOTE — Assessment & Plan Note (Signed)
BP controlled on Lisinopril Will check CBC and CMET today

## 2015-07-08 NOTE — Addendum Note (Signed)
Addended by: Jearld Fenton on: 07/08/2015 12:52 PM   Modules accepted: Orders, Medications

## 2015-07-08 NOTE — Telephone Encounter (Signed)
Richardson Landry with [facility] called.  Patient came into pharmacy and had a cut in her mouth.  She wanted to know what she could use.  He recommended Kenalog and something else.  Richardson Landry wants [name] to call this is on behalf of the patient.    [facility] called:  [phone]

## 2015-07-08 NOTE — Assessment & Plan Note (Signed)
Controlled on Flonase and Allegra  

## 2015-07-08 NOTE — Assessment & Plan Note (Signed)
Insomnia and daytime fatigue has improved She will continue to wear CPAP as instructed She will follow with Dr. Ashby Dawes

## 2015-07-08 NOTE — Progress Notes (Signed)
Subjective:    Patient ID: Jill Snow, female    DOB: [birth year], 81 y.o.   MRN: [id number]  HPI  Pt presents to the clinic today for 6 month follow up of chronic conditions.  Afib/Aflutter:  She has not been feeling like her heart is racing. She takes Amiodarone. She is not on any ASA or anticoag. She follows with Dr. Rockey Situ- note from 7/206 reviewed. Echo from 03/2015 showed a EF of 60-65% with mild-moderate mitral regurgitation.  HTN: Her BP today is 128/74. She is taking Lisinopril. She denies any chest pain, shortness of breath or cough. ECG from 02/2015 reviewed.  HLD: She denies myalgias on Zocor. She does consume a lot of fried foods but reports she has been trying to cut back.  OSA: Now on nasal CPAP. She is now able to sleep 8 hours witht he use of the machine. She does feel like her daytime fatigue has improved. She follows with Dr. Ashby Dawes- note from 03/2015 reviewed.   Seasonal Allergies: She takes the Allegra and Flonase all year around. She does feel like her symptoms are well controlled.  Insomnia: She is not taking the Trazadone and Ambien. She reports they do not work for her. She does report improvement with her sleep since she started wearing the CPAP machine.  She has a sore in her mouth. She noticed this a few months ago after starting to wear her nasal CPAP. She did call her pulmonologist and reports he told her this can be a common side effect. The sore gets bigger and smaller but never completely goes away. It is very sore. She has not tried anything OTC. She reports the pharmacist at [facility] recommended an antibiotic which would make this go away, but she can not remember the name of it.  She also reports pain in her back. This started on Saturday after a fall at her home. She was walking when her feet slipped out from under her. She landed flat on her back. She denies hitting her head or LOC. She had immense trouble getting up. She has  complained if pain in the mid of her back since that time. She describes the pain as soreness. It is worse with certain movement. She denies radicular pain. She denies loss of bowel or bladder. She has tried Advil with some relief.  Review of Systems  Past Medical History  Diagnosis Date  . PAF (paroxysmal atrial fibrillation) ([place])     a. initial episode 2011; b. not on long term anticoagulation since 06/2013  . Hypertension   . Hypercholesterolemia   . Mitral regurgitation     a. echo 2014: EF 40-45%, mildly dilated RV, mod reduced RV systolic fxn, mod dilated LA, Mild to mod MR, mod TR, mildly elevated PASP  . Dyspnea   . Seasonal allergies   . Sleep apnea     a. on CPAP  . RBBB (right bundle branch block)   . History of chicken pox   . Atrial flutter ([place])     a. s/p successful TEE/DCCV in 2014; b. TEE 2014 showed EF 45-50%, mild bi-atrial enlargement, mod MR  . Atrial fibrillation [facility])     Current Outpatient Prescriptions  Medication Sig Dispense Refill  . amiodarone (PACERONE) 200 MG tablet TAKE ONE TABLET BY MOUTH EVERY DAY 30 tablet 8  . fexofenadine (ALLEGRA) 180 MG tablet TAKE ONE TABLET EVERY DAY 30 tablet 11  . fluticasone (FLONASE) 50 MCG/ACT nasal spray TAKE 2 SPRAYS  IN EACH NOSTRIL EVERY DAY 16 g 5  . lisinopril (PRINIVIL,ZESTRIL) 10 MG tablet TAKE ONE TABLET EVERY DAY 30 tablet 3  . montelukast (SINGULAIR) 10 MG tablet TAKE ONE TABLET EVERY DAY 30 tablet 5  . simvastatin (ZOCOR) 40 MG tablet TAKE ONE TABLET AT BEDTIME 30 tablet 1  . Vitamin D, Ergocalciferol, (DRISDOL) 50000 UNITS CAPS capsule Take 1 capsule (50,000 Units total) by mouth every 7 (seven) days. 12 capsule 3  . clindamycin (CLEOCIN) 300 MG capsule Take 1 capsule (300 mg total) by mouth 3 (three) times daily. (Patient not taking: Reported on 07/08/2015) 30 capsule 0  . traMADol (ULTRAM) 50 MG tablet Take 1 tablet (50 mg total) by mouth every 8 (eight) hours as needed. (Patient not taking: Reported on  07/08/2015) 30 tablet 0  . traZODone (DESYREL) 50 MG tablet Take 0.5 tablets (25 mg total) by mouth at bedtime as needed for sleep. (Patient not taking: Reported on 07/08/2015) 30 tablet 0  . zolpidem (AMBIEN) 5 MG tablet TAKE ONE TABLET BY MOUTH AT BEDTIME AS NEEDED (Patient not taking: Reported on 07/08/2015) 30 tablet 0   No current facility-administered medications for this visit.    Allergies  Allergen Reactions  . Codeine Anaphylaxis    "cant remember reaction"  . Augmentin [Amoxicillin-Pot Clavulanate] Nausea And Vomiting  . Crestor [Rosuvastatin] Other (See Comments)    Muscle pain/cramps     Family History  Problem Relation Age of Onset  . Stroke Mother   . Heart attack Mother   . Arthritis Mother   . Hyperlipidemia Mother   . Heart disease Mother   . Diabetes Mother   . Diabetes Sister   . Cancer Daughter   . Heart disease Daughter   . Diabetes Daughter   . Diabetes Brother   . Diabetes Son   . Diabetes Maternal Grandmother   . Diabetes Maternal Grandfather     Social History   Social History  . Marital Status: Widowed    Spouse Name: N/A  . Number of Children: 2  . Years of Education: N/A   Occupational History  . REALTOR    Social History Main Topics  . Smoking status: Never Smoker   . Smokeless tobacco: Never Used  . Alcohol Use: No  . Drug Use: No  . Sexual Activity: No   Other Topics Concern  . Not on file   Social History Narrative     Constitutional: Denies fever, malaise, fatigue, headache or abrupt weight changes.  HEENT: Pt reports blurred vision (cataracts) anda sore in her mouth. Denies eye pain, eye redness, ear pain, ringing in the ears, wax buildup, runny nose, nasal congestion, bloody nose, or sore throat. Respiratory: Denies difficulty breathing, shortness of breath, cough or sputum production.   Cardiovascular: Denies chest pain, chest tightness, palpitations or swelling in the hands or feet.  Musculoskeletal: Pt reports back  pain. Denies decrease in range of motion, difficulty with gait, muscle pain or joint pain and swelling.  Neurological: Denies dizziness, difficulty with memory, difficulty with speech or problems with balance and coordination.  Psych: Pt reports anxiety. Denies anxiety, depression, SI/HI.  No other specific complaints in a complete review of systems (except as listed in HPI above).     Objective:   Physical Exam    BP 128/74 mmHg  Pulse 66  Temp(Src) 98.4 F (36.9 C) (Oral)  Wt 170 lb 8 oz (77.338 kg)  SpO2 99% Wt Readings from Last 3 Encounters:  07/08/15 170 lb  8 oz (77.338 kg)  04/25/15 164 lb (74.39 kg)  04/18/15 163 lb (73.936 kg)    General: Appears her stated age, well developed, well nourished in NAD. Skin: Warm, dry and intact. No bruising noted from the fall. HEENT: Head: normal shape and size; Eyes: sclera white, no icterus, conjunctiva pink, PERRLA and EOMs intact; She has a small, round, raised lesion on left upper gum. Cardiovascular: Normal rate and rhythm. S1,S2 noted.  No murmur, rubs or gallops noted. No JVD or BLE edema. No carotid bruits noted. Pulmonary/Chest: Normal effort and positive vesicular breath sounds. No respiratory distress. No wheezes, rales or ronchi noted.  Musculoskeletal: Pain with spinal flexion. Normal rotation and extension. She has some bony tenderness noted over the thoracic spine. Strength 5/5 BLE. Neurological: Alert and oriented.  Psychiatric: She is talkative and does engage. She does seem mildly anxious today.  BMET    Component Value Date/Time   NA 139 03/30/2015 1038   NA 139 05/15/2013 0514   K 3.9 03/30/2015 1038   K 3.7 05/15/2013 0514   CL 106 03/30/2015 1038   CL 105 05/15/2013 0514   CO2 27 03/30/2015 1038   CO2 30 05/15/2013 0514   GLUCOSE 101* 03/30/2015 1038   GLUCOSE 92 05/15/2013 0514   BUN 12 03/30/2015 1038   BUN 11 05/15/2013 0514   CREATININE 0.71 03/30/2015 1038   CREATININE 1.25 05/15/2013 0514    CALCIUM 9.6 03/30/2015 1038   CALCIUM 9.5 05/15/2013 0514   GFRNONAA >60 03/30/2015 1038   GFRNONAA 41* 05/15/2013 0514   GFRAA >60 03/30/2015 1038   GFRAA 48* 05/15/2013 0514    Lipid Panel     Component Value Date/Time   CHOL 230* 01/07/2015 0925   CHOL 281* 03/07/2014 0832   TRIG 165.0* 01/07/2015 0925   HDL 44.40 01/07/2015 0925   HDL 49 03/07/2014 0832   CHOLHDL 5 01/07/2015 0925   CHOLHDL 5.7* 03/07/2014 0832   VLDL 33.0 01/07/2015 0925   LDLCALC 153* 01/07/2015 0925   LDLCALC 202* 03/07/2014 0832    CBC    Component Value Date/Time   WBC 7.4 03/30/2015 1038   WBC 4.9 05/15/2013 0514   RBC 4.44 03/30/2015 1038   RBC 4.73 05/15/2013 0514   HGB 14.4 03/30/2015 1038   HGB 14.6 05/15/2013 0514   HCT 41.7 03/30/2015 1038   HCT 43.6 05/15/2013 0514   PLT 164 03/30/2015 1038   PLT 171 05/15/2013 0514   MCV 94.0 03/30/2015 1038   MCV 92 05/15/2013 0514   MCH 32.4 03/30/2015 1038   MCH 30.9 05/15/2013 0514   MCHC 34.4 03/30/2015 1038   MCHC 33.5 05/15/2013 0514   RDW 12.9 03/30/2015 1038   RDW 13.9 05/15/2013 0514   LYMPHSABS 0.8* 03/30/2015 1038   LYMPHSABS 1.5 05/15/2013 0514   MONOABS 0.8 03/30/2015 1038   MONOABS 0.4 05/15/2013 0514   EOSABS 0.2 03/30/2015 1038   EOSABS 0.3 05/15/2013 0514   BASOSABS 0.1 03/30/2015 1038   BASOSABS 0.1 05/15/2013 0514    Hgb A1C Lab Results  Component Value Date   HGBA1C 5.0 04/25/2015       Assessment & Plan:   Thoracic back pain, s/p fall at home:  Will check xray of thoracic spine to r/o compression fracture Continue Advil or Aleve for Ibuprofen  Afib/Aflutter:  She has not had any issues on Amiodarone Anticoagulation not needed per Dr. Rockey Situ (although given HLD, she should be on a baby ASA daily)  Sore in mouth:  eRx for magic mouthwast TID prn  RTC in 6 months for wellness exam/followup She will continue to follow with Dr. Rockey Situ

## 2015-07-09 LAB — CBC
HEMATOCRIT: 42.3 % (ref 36.0–46.0)
HEMOGLOBIN: 14.1 g/dL (ref 12.0–15.0)
MCHC: 33.4 g/dL (ref 30.0–36.0)
MCV: 94.9 fl (ref 78.0–100.0)
Platelets: 179 10*3/uL (ref 150.0–400.0)
RBC: 4.46 Mil/uL (ref 3.87–5.11)
RDW: 12.7 % (ref 11.5–15.5)
WBC: 4.6 10*3/uL (ref 4.0–10.5)

## 2015-07-10 ENCOUNTER — Other Ambulatory Visit: Payer: Self-pay | Admitting: Internal Medicine

## 2015-07-10 DIAGNOSIS — E785 Hyperlipidemia, unspecified: Secondary | ICD-10-CM

## 2015-07-10 MED ORDER — EZETIMIBE 10 MG PO TABS: 10.0000 mg | ORAL_TABLET | Freq: Every day | ORAL | Status: DC

## 2015-07-10 NOTE — Telephone Encounter (Signed)
Rx faxed to pharmacy  

## 2015-07-18 ENCOUNTER — Telehealth: Payer: Self-pay | Admitting: Internal Medicine

## 2015-07-18 NOTE — Telephone Encounter (Signed)
Pt would like a call back Her mouth is not any better from the cpap machine She wanted to know what she needs to do

## 2015-07-18 NOTE — Telephone Encounter (Signed)
lmovm for pt to return my call also asked pt about order we received in reference to ordering CPAP supplies--wanted to confirm if pt requested before I fax order

## 2015-07-18 NOTE — Telephone Encounter (Signed)
I'm not sure what to do if the treatment I provided didn't work. She should discuss with her pulmonologist if this is a common occurrence with CPAP users

## 2015-07-18 NOTE — Telephone Encounter (Signed)
Spoke to pt and she is aware that it will be best to contact pulm and also states she did not request any CPAP supplies

## 2015-07-30 ENCOUNTER — Ambulatory Visit (INDEPENDENT_AMBULATORY_CARE_PROVIDER_SITE_OTHER): Payer: PPO | Admitting: Internal Medicine

## 2015-07-30 ENCOUNTER — Encounter: Payer: Self-pay | Admitting: Internal Medicine

## 2015-07-30 ENCOUNTER — Telehealth: Payer: Self-pay | Admitting: Internal Medicine

## 2015-07-30 VITALS — BP 128/72 | HR 65 | Ht 66.5 in | Wt 171.8 lb

## 2015-07-30 DIAGNOSIS — G4733 Obstructive sleep apnea (adult) (pediatric): Secondary | ICD-10-CM | POA: Diagnosis not present

## 2015-07-30 DIAGNOSIS — R413 Other amnesia: Secondary | ICD-10-CM

## 2015-07-30 DIAGNOSIS — I480 Paroxysmal atrial fibrillation: Secondary | ICD-10-CM | POA: Diagnosis not present

## 2015-07-30 NOTE — Patient Instructions (Addendum)
-  Take Allegra-D, instead of Allegra for the next 2 weeks. -Make sure to have your ears cleaned of wax, either with your PCP, or with a kit from the pharmacist. -You have an aphthous ulcer, which should go away on its own in the next 1-2 weeks, and is not related to CPAP. -Use your Flonase 2 sprays in each nostril every night. -We can restart Ambien 5 mg every night as needed. Try not to use this medication if it is not needed or for long periods of time, as it can cause addiction. -Continue to use her CPAP every night.

## 2015-07-30 NOTE — Telephone Encounter (Signed)
LM for pt to return call. The only medication I know of is the Ambien that Margie called in to the pharmacy this am. Will wait for pt's call.

## 2015-07-30 NOTE — Progress Notes (Signed)
* [name]     Assessment and Plan:  Obstructive sleep apnea on CPAP --AHI of 20, was started on a new CPAP.  Cerumen impaction. -This may be the cause of some of her ear pain while on CPAP. I recommended that she buy kit from the pharmacy to clean her ears, or follow-up with her PCP.  Allergic rhinitis. -This may be continuing to some of her ear pain as well. Recommend she use her Flonase at night, 2 sprays in each nostril. I also recommended that instead of Allegra, She can use Allegra-D for the next 2 weeks.  Atrial fibrillation --May be exacerbated by OSA, it is important for her to stay on CPAP.   Memory impairment -Mild cognitive dysfunction since she has been off her CPAP, which is likely due to untreated obstructive sleep apnea, this is improved since she has been on CPAP.  Excessive daytime sleepiness -Due to obstructive sleep apnea, she notes this is significantly improved since being on CPAP.   Date: 07/30/2015  MRN# [id number] Jill Snow October 02, 1933   Jill Snow is a 81 y.o. old female seen in follow up for chief complaint of  Chief Complaint  Patient presents with  . Follow-up    pt. states she tries to wear CPAP 7hr. everynight but feels it's causing sores in her mouth and pain in rt. ear.  pressure is good. no sipplies needed. DME:[facility]     HPI:   The patient has a history of obstructive sleep apnea, as well as atrial fibrillation, diastolic congestive heart failure. She was previously diagnosed with sleep apnea in May 2011, with an AHI of 23. She was started on CPAP, but her machine broke and she was off it for a significant amount of time. In order to get her restarted on CPAP, it was required that we send her for another sleep study. Per insurance regulations. Subsequently she was sent for a repeat sleep study which showed an AHI of 20, and she was started on a new CPAP. Today she notes she has been using her CPAP  regularly. However, she is not tolerating as well as she had in the past. She notes that she has had some pain in her right ear, as well as an ulcer on the inside of her upper lip.  Medication:   Outpatient Encounter Prescriptions as of 07/30/2015  Medication Sig  . amiodarone (PACERONE) 200 MG tablet TAKE ONE TABLET BY MOUTH EVERY DAY  . ezetimibe (ZETIA) 10 MG tablet Take 1 tablet (10 mg total) by mouth daily.  . fexofenadine (ALLEGRA) 180 MG tablet TAKE ONE TABLET EVERY DAY  . fluticasone (FLONASE) 50 MCG/ACT nasal spray TAKE 2 SPRAYS IN EACH NOSTRIL EVERY DAY  . lisinopril (PRINIVIL,ZESTRIL) 10 MG tablet TAKE ONE TABLET EVERY DAY  . montelukast (SINGULAIR) 10 MG tablet TAKE ONE TABLET EVERY DAY  . simvastatin (ZOCOR) 40 MG tablet TAKE ONE TABLET AT BEDTIME  . Vitamin D, Ergocalciferol, (DRISDOL) 50000 UNITS CAPS capsule Take 1 capsule (50,000 Units total) by mouth every 7 (seven) days.  . [DISCONTINUED] magic mouthwash w/lidocaine SOLN Take 5 mLs by mouth 3 (three) times daily as needed for mouth pain. (Patient not taking: Reported on 07/30/2015)   No facility-administered encounter medications on file as of 07/30/2015.     Allergies:  Codeine; Augmentin; and Crestor  Review of Systems: Gen:  Denies  fever, sweats. HEENT: Denies blurred vision. Cvc:  No dizziness, chest pain or heaviness Resp:  Denies cough or sputum porduction. Gi: Denies swallowing difficulty, stomach pain. constipation Gu:  Denies bladder incontinence, burning urine Ext:   No Joint pain, stiffness. Skin: No skin rash, easy bruising. Endoc:  No polyuria, polydipsia. Psych: No depression, insomnia. Other:  All other systems were reviewed and found to be negative other than what is mentioned in the HPI.   Physical Examination:   VS: BP 128/72 mmHg  Pulse 65  Ht 5' 6.5" (1.689 m)  Wt 171 lb 12.8 oz (77.928 kg)  BMI 27.32 kg/m2  SpO2 98%  General Appearance: No distress  Neuro:without focal findings,   speech normal,  HEENT: PERRLA, EOM intact. Inside of the upper lip has a small aphthous ulcer. There is cerumen impaction in both ears. Pulmonary: normal breath sounds, No wheezing.   CardiovascularNormal S1,S2.  No m/r/g.   Abdomen: Benign, Soft, non-tender. Renal:  No costovertebral tenderness  GU:  Not performed at this time. Endoc: No evident thyromegaly, no signs of acromegaly. Skin:   warm, no rash. Extremities: normal, no cyanosis, clubbing.   LABORATORY PANEL:   CBC No results for input(s): WBC, HGB, HCT, PLT in the last 168 hours. ------------------------------------------------------------------------------------------------------------------  Chemistries  No results for input(s): NA, K, CL, CO2, GLUCOSE, BUN, CREATININE, CALCIUM, MG, AST, ALT, ALKPHOS, BILITOT in the last 168 hours.  Invalid input(s): GFRCGP ------------------------------------------------------------------------------------------------------------------  Cardiac Enzymes No results for input(s): TROPONINI in the last 168 hours. ------------------------------------------------------------  RADIOLOGY:   No results found for this or any previous visit. Results for orders placed in visit on 08/20/99  DG Chest 2 View   Narrative FINDINGS IMPRESSION 1.  QUESTIONABLE 7 MM NODULE AT THE LEFT BASE WITH OTHERWISE UNREMARKABLE CHEST AND I BELIEVE THE CHEST IS SATISFACTORY FOR THE PROPOSED SURGICAL PROCEDURE. 2.  THE PATIENT HAS HAD PREVIOUS CHEST FILMS AT [facility] WILL ATTEMPT TO OBTAIN AND ADD AN ADDENDUM TO THIS REPORT.   IF PRIOR FILMS ARE NOT AVAILABLE RECOMMEND LIMITED CT OF THE CHEST. ADDENDUM THE PATIENT WAS ABLE TO OBTAIN COPIES OF CHEST FILMS PERFORMED AT [facility] ON 01/26/96 AND THERE IS NO DEFINITE NODULE AT THE LEFT BASE.  I WOULD RECOMMEND A LIMITED CT OF THE CHEST.    ------------------------------------------------------------------------------------------------------------------  Thank  you for allowing [facility] to assist in the care of your patient. Our recommendations are noted above.  Please contact us if we can be of further service.   Marda Stalker, MD.  [facility] Office Number: [phone]  Sally Pesa, M.D.  Vilinda Boehringer, M.D.  Merton Border, M.D

## 2015-07-30 NOTE — Telephone Encounter (Signed)
Called and spoke with pt Pt stated that provider told her this morning during visit that medication refill would be sent to pharmacy  Pt states that no refill was received from her pharmacy today Informed pt that i did not see if our system where a refill had been sent, but that i would send a message to [facility] to see if she could find out which medication was suppose to be sent  Misty, please advise. Thanks

## 2015-07-31 MED ORDER — ZOLPIDEM TARTRATE 5 MG PO TABS: 5.0000 mg | ORAL_TABLET | Freq: Every evening | ORAL | Status: DC | PRN

## 2015-07-31 NOTE — Telephone Encounter (Signed)
[facility] called [facility] and spoke w/ Terry--no RX was called into them and she double checked and still did not see anything. [facility] RX [facility] and this has been cancelled.

## 2015-07-31 NOTE — Telephone Encounter (Signed)
Spoke with patient- states that the Ambien Rx was called into the wrong pharmacy - [facility] was [facility] and was supposed to go [place].  Mindy called [facility] and cancelled this Rx and has called this into [facility] - pt is aware.  Ambien 5mg  - Take 1 qhs prn #30 x 1 Nothing further needed.

## 2015-07-31 NOTE — Telephone Encounter (Signed)
LMTCB x2 for pt 

## 2015-08-01 ENCOUNTER — Ambulatory Visit: Payer: PPO | Admitting: Internal Medicine

## 2015-08-01 ENCOUNTER — Telehealth: Payer: Self-pay | Admitting: Internal Medicine

## 2015-08-01 NOTE — Telephone Encounter (Signed)
error 

## 2015-08-08 ENCOUNTER — Ambulatory Visit (INDEPENDENT_AMBULATORY_CARE_PROVIDER_SITE_OTHER): Payer: PPO | Admitting: Internal Medicine

## 2015-08-08 ENCOUNTER — Encounter: Payer: Self-pay | Admitting: Internal Medicine

## 2015-08-08 VITALS — BP 126/74 | HR 64 | Temp 98.5°F | Wt 169.0 lb

## 2015-08-08 DIAGNOSIS — H6123 Impacted cerumen, bilateral: Secondary | ICD-10-CM | POA: Diagnosis not present

## 2015-08-08 DIAGNOSIS — H109 Unspecified conjunctivitis: Secondary | ICD-10-CM | POA: Diagnosis not present

## 2015-08-08 DIAGNOSIS — J309 Allergic rhinitis, unspecified: Secondary | ICD-10-CM | POA: Diagnosis not present

## 2015-08-08 MED ORDER — POLYMYXIN B-TRIMETHOPRIM 10000-0.1 UNIT/ML-% OP SOLN: 1.0000 [drp] | OPHTHALMIC | Status: DC

## 2015-08-08 MED ORDER — MONTELUKAST SODIUM 10 MG PO TABS: 10.0000 mg | ORAL_TABLET | Freq: Every day | ORAL | Status: DC

## 2015-08-08 NOTE — Progress Notes (Signed)
Pre visit review using our clinic review tool, if applicable. No additional management support is needed unless otherwise documented below in the visit note. 

## 2015-08-08 NOTE — Patient Instructions (Signed)

## 2015-08-08 NOTE — Progress Notes (Addendum)
HPI  Pt presents to the clinic today with c/o right eye pain and swelling. She noticed this yesterday. Her right eye does itchy. She has noticed some drainage from the eye and blurry vision. She has had associated nasal congestion and cough. The cough is non productive. She denies fever, chills or body aches. She takes Allegra, Singulair and Flonase but has not tried anything additional OTC. She has not had sick contacts that she is aware of.  Review of Systems    Past Medical History  Diagnosis Date  . PAF (paroxysmal atrial fibrillation) ([place])     a. initial episode 2011; b. not on long term anticoagulation since 06/2013  . Hypertension   . Hypercholesterolemia   . Mitral regurgitation     a. echo 2014: EF 40-45%, mildly dilated RV, mod reduced RV systolic fxn, mod dilated LA, Mild to mod MR, mod TR, mildly elevated PASP  . Dyspnea   . Seasonal allergies   . Sleep apnea     a. on CPAP  . RBBB (right bundle branch block)   . History of chicken pox   . Atrial flutter ([place])     a. s/p successful TEE/DCCV in 2014; b. TEE 2014 showed EF 45-50%, mild bi-atrial enlargement, mod MR  . Atrial fibrillation ([facility])     Family History  Problem Relation Age of Onset  . Stroke Mother   . Heart attack Mother   . Arthritis Mother   . Hyperlipidemia Mother   . Heart disease Mother   . Diabetes Mother   . Diabetes Sister   . Cancer Daughter   . Heart disease Daughter   . Diabetes Daughter   . Diabetes Brother   . Diabetes Son   . Diabetes Maternal Grandmother   . Diabetes Maternal Grandfather     Social History   Social History  . Marital Status: Widowed    Spouse Name: N/A  . Number of Children: 2  . Years of Education: N/A   Occupational History  . REALTOR    Social History Main Topics  . Smoking status: Never Smoker   . Smokeless tobacco: Never Used  . Alcohol Use: No  . Drug Use: No  . Sexual Activity: No   Other Topics Concern  . Not on file   Social History  Narrative    Allergies  Allergen Reactions  . Codeine Anaphylaxis    "cant remember reaction"  . Augmentin [Amoxicillin-Pot Clavulanate] Nausea And Vomiting  . Crestor [Rosuvastatin] Other (See Comments)    Muscle pain/cramps      Constitutional: Denies headache, fatigue, fever or abrupt weight changes.  HEENT:  Positive eye pain, eye redness, nasal congestion and sore throat. Denies ear pain, ringing in the ears, wax buildup, runny nose or bloody nose. Respiratory: Positive cough. Denies difficulty breathing or shortness of breath.  Cardiovascular: Denies chest pain, chest tightness, palpitations or swelling in the hands or feet.   No other specific complaints in a complete review of systems (except as listed in HPI above).  Objective:  BP 126/74 mmHg  Pulse 64  Temp(Src) 98.5 F (36.9 C) (Oral)  Wt 169 lb (76.658 kg)  SpO2 98%   General: Appears her stated age,  in NAD. HEENT: Head: normal shape and size, no sinus tenderness noted; Right Eye: sclera injected, no icterus, conjunctiva erythematous, yellow drainage noted; Ears: partially occluded with wax; Nose: mucosa pink and moist, septum midline; Throat/Mouth: + PND. Teeth present, mucosa pink and moist, no exudate noted,  no lesions or ulcerations noted.  Neck:  No adenopathy noted.  Cardiovascular: Normal rate and rhythm. S1,S2 noted.  No murmur, rubs or gallops noted.  Pulmonary/Chest: Normal effort and positive vesicular breath sounds. No respiratory distress. No wheezes, rales or ronchi noted.      Assessment & Plan:   Conjunctivitis, right eye  Advised her to wash her hands thoroughly after touching her face Ibuprofen and warm compresses TID eRx for Neomycin eye drops Q4H while awake  Allergic Rhinitis:  Continue Singulair, Allegra and Flonase Singulair refilled today  Bilateral Cerumen Impaction:  Manual lavage by CMA Advised her to try using Debrox drops 2 x week to prevent wax buildup  RTC as needed  or if symptoms persist.

## 2015-08-09 ENCOUNTER — Telehealth: Payer: Self-pay | Admitting: Internal Medicine

## 2015-08-09 NOTE — Telephone Encounter (Signed)
Pt said she is not able to sleep; rt eye is bothering her a lot; pt has hx of trouble sleeping but problem with her eye has made it worse to try to sleep; cant take Lorrin Mais because gives pt crazy dreams. Pt [facility]. Has not gotten cb from Dr Ashby Dawes' office about changing ambien. Webb Silversmith NP has left office. Pt request cb. Pt said she has to get some sleep. Pt can go to sleep and the when wakes up cannot go back to sleep. Please advise.

## 2015-08-09 NOTE — Telephone Encounter (Signed)
Let pt know  We can send in rx of trazodone if she does not think she was intolerant in past. Let me know.

## 2015-08-09 NOTE — Telephone Encounter (Signed)
Pt said she will try taking trazodone 50 mg one tab tonight; pt said in the past she only took 1/2 tab. Pt appreciative and will cb if needed.

## 2015-08-09 NOTE — Telephone Encounter (Signed)
Spoke with pt and pt has tried trazodone in past and was not effective. Pt has trazodone at home and may try taking the trazodone again; pt only took trazodone x 1 before. Pt said not to worry she will try something.

## 2015-08-09 NOTE — Telephone Encounter (Signed)
Pt would like you to call her back. She said it was regarding her pink eye and how she is not getting sleep. You can reach her at [phone]

## 2015-08-13 ENCOUNTER — Ambulatory Visit (INDEPENDENT_AMBULATORY_CARE_PROVIDER_SITE_OTHER): Payer: PPO | Admitting: Internal Medicine

## 2015-08-13 ENCOUNTER — Encounter: Payer: Self-pay | Admitting: Internal Medicine

## 2015-08-13 VITALS — BP 130/66 | HR 76 | Temp 98.4°F | Ht 66.5 in | Wt 170.0 lb

## 2015-08-13 DIAGNOSIS — H109 Unspecified conjunctivitis: Secondary | ICD-10-CM | POA: Diagnosis not present

## 2015-08-13 DIAGNOSIS — Z0000 Encounter for general adult medical examination without abnormal findings: Secondary | ICD-10-CM

## 2015-08-13 NOTE — Progress Notes (Signed)
HPI:  Pt presents to the clinic today for her Medicare Wellness Exam.  Past Medical History  Diagnosis Date  . PAF (paroxysmal atrial fibrillation) ([place])     a. initial episode 2011; b. not on long term anticoagulation since 06/2013  . Hypertension   . Hypercholesterolemia   . Mitral regurgitation     a. echo 2014: EF 40-45%, mildly dilated RV, mod reduced RV systolic fxn, mod dilated LA, Mild to mod MR, mod TR, mildly elevated PASP  . Dyspnea   . Seasonal allergies   . Sleep apnea     a. on CPAP  . RBBB (right bundle branch block)   . History of chicken pox   . Atrial flutter ([place])     a. s/p successful TEE/DCCV in 2014; b. TEE 2014 showed EF 45-50%, mild bi-atrial enlargement, mod MR  . Atrial fibrillation [facility])     Current Outpatient Prescriptions  Medication Sig Dispense Refill  . amiodarone (PACERONE) 200 MG tablet TAKE ONE TABLET BY MOUTH EVERY DAY 30 tablet 8  . ezetimibe (ZETIA) 10 MG tablet Take 1 tablet (10 mg total) by mouth daily. 30 tablet 2  . fexofenadine (ALLEGRA) 180 MG tablet TAKE ONE TABLET EVERY DAY 30 tablet 11  . fluticasone (FLONASE) 50 MCG/ACT nasal spray TAKE 2 SPRAYS IN EACH NOSTRIL EVERY DAY 16 g 5  . lisinopril (PRINIVIL,ZESTRIL) 10 MG tablet TAKE ONE TABLET EVERY DAY 30 tablet 3  . montelukast (SINGULAIR) 10 MG tablet Take 1 tablet (10 mg total) by mouth daily. 30 tablet 5  . simvastatin (ZOCOR) 40 MG tablet TAKE ONE TABLET AT BEDTIME 30 tablet 1  . trimethoprim-polymyxin b (POLYTRIM) ophthalmic solution Place 1 drop into the right eye every 4 (four) hours. 10 mL 0  . Vitamin D, Ergocalciferol, (DRISDOL) 50000 UNITS CAPS capsule Take 1 capsule (50,000 Units total) by mouth every 7 (seven) days. 12 capsule 3  . zolpidem (AMBIEN) 5 MG tablet Take 1 tablet (5 mg total) by mouth at bedtime as needed for sleep. 30 tablet 1   No current facility-administered medications for this visit.    Allergies  Allergen Reactions  . Codeine Anaphylaxis    "cant  remember reaction"  . Augmentin [Amoxicillin-Pot Clavulanate] Nausea And Vomiting  . Crestor [Rosuvastatin] Other (See Comments)    Muscle pain/cramps     Family History  Problem Relation Age of Onset  . Stroke Mother   . Heart attack Mother   . Arthritis Mother   . Hyperlipidemia Mother   . Heart disease Mother   . Diabetes Mother   . Diabetes Sister   . Cancer Daughter   . Heart disease Daughter   . Diabetes Daughter   . Diabetes Brother   . Diabetes Son   . Diabetes Maternal Grandmother   . Diabetes Maternal Grandfather     Social History   Social History  . Marital Status: Widowed    Spouse Name: N/A  . Number of Children: 2  . Years of Education: N/A   Occupational History  . REALTOR    Social History Main Topics  . Smoking status: Never Smoker   . Smokeless tobacco: Never Used  . Alcohol Use: No  . Drug Use: No  . Sexual Activity: No   Other Topics Concern  . Not on file   Social History Narrative    Hospitiliaztions: None  Health Maintenance:    Flu: 05/2015  Tetanus: 12/2013  Pneumovax: 07/2013  Prevnar: 05/2015  Zostavax: unsure  Mammogram: 09/2014  Pap Smear: > 5 years ago  Bone Density: > 2 years ago  Colon Screening: never  Eye Doctor: 01/2015  Dental Exam: biannually   Providers:   PCP: Webb Silversmith, NP-C  Cardiologist: Dr. Rockey Situ  Pulmonologist: Dr. Ashby Dawes   I have personally reviewed and have noted:  1. The patient's medical and social history 2. Their use of alcohol, tobacco or illicit drugs 3. Their current medications and supplements 4. The patient's functional ability including ADL's, fall risks, home safety risks and  hearing or visual impairment. 5. Diet and physical activities 6. Evidence for depression or mood disorder  Subjective:   Review of Systems:   Constitutional: Denies fever, malaise, fatigue, headache or abrupt weight changes.  HEENT: Pt reports bilateral eye pain and redness. Denies ear pain,  ringing in the ears, wax buildup, runny nose, nasal congestion, bloody nose, or sore throat. Respiratory: Denies difficulty breathing, shortness of breath, cough or sputum production.   Cardiovascular: Denies chest pain, chest tightness, palpitations or swelling in the hands or feet.  Neurological: Denies dizziness, difficulty with memory, difficulty with speech or problems with balance and coordination.    No other specific complaints in a complete review of systems (except as listed in HPI above).  Objective:  PE:   BP 130/66 mmHg  Pulse 76  Temp(Src) 98.4 F (36.9 C) (Oral)  Ht 5' 6.5" (1.689 m)  Wt 170 lb (77.111 kg)  BMI 27.03 kg/m2  SpO2 97%  Wt Readings from Last 3 Encounters:  08/08/15 169 lb (76.658 kg)  07/30/15 171 lb 12.8 oz (77.928 kg)  07/08/15 170 lb 8 oz (77.338 kg)    General: Appears her stated age, well developed, well nourished in NAD. HEENT:  Eyes: sclera inhjected, no icterus, conjunctiva erythematous, no drainage noted.  Neck: No adenopathy noted. Neurological: Alert and oriented.   Psychiatric: Mood and affect normal. Behavior is normal. Judgment and thought content normal.     BMET    Component Value Date/Time   NA 140 07/08/2015 0913   NA 139 05/15/2013 0514   K 3.9 07/08/2015 0913   K 3.7 05/15/2013 0514   CL 104 07/08/2015 0913   CL 105 05/15/2013 0514   CO2 27 07/08/2015 0913   CO2 30 05/15/2013 0514   GLUCOSE 96 07/08/2015 0913   GLUCOSE 92 05/15/2013 0514   BUN 14 07/08/2015 0913   BUN 11 05/15/2013 0514   CREATININE 0.80 07/08/2015 0913   CREATININE 1.25 05/15/2013 0514   CALCIUM 9.7 07/08/2015 0913   CALCIUM 9.5 05/15/2013 0514   GFRNONAA >60 03/30/2015 1038   GFRNONAA 41* 05/15/2013 0514   GFRAA >60 03/30/2015 1038   GFRAA 48* 05/15/2013 0514    Lipid Panel     Component Value Date/Time   CHOL 267* 07/08/2015 0913   CHOL 281* 03/07/2014 0832   TRIG 107.0 07/08/2015 0913   HDL 55.00 07/08/2015 0913   HDL 49 03/07/2014  0832   CHOLHDL 5 07/08/2015 0913   CHOLHDL 5.7* 03/07/2014 0832   VLDL 21.4 07/08/2015 0913   LDLCALC 191* 07/08/2015 0913   LDLCALC 202* 03/07/2014 0832    CBC    Component Value Date/Time   WBC 4.6 07/08/2015 0913   WBC 4.9 05/15/2013 0514   RBC 4.46 07/08/2015 0913   RBC 4.73 05/15/2013 0514   HGB 14.1 07/08/2015 0913   HGB 14.6 05/15/2013 0514   HCT 42.3 07/08/2015 0913   HCT 43.6 05/15/2013 0514   PLT 179.0 07/08/2015  0913   PLT 171 05/15/2013 0514   MCV 94.9 07/08/2015 0913   MCV 92 05/15/2013 0514   MCH 32.4 03/30/2015 1038   MCH 30.9 05/15/2013 0514   MCHC 33.4 07/08/2015 0913   MCHC 33.5 05/15/2013 0514   RDW 12.7 07/08/2015 0913   RDW 13.9 05/15/2013 0514   LYMPHSABS 0.8* 03/30/2015 1038   LYMPHSABS 1.5 05/15/2013 0514   MONOABS 0.8 03/30/2015 1038   MONOABS 0.4 05/15/2013 0514   EOSABS 0.2 03/30/2015 1038   EOSABS 0.3 05/15/2013 0514   BASOSABS 0.1 03/30/2015 1038   BASOSABS 0.1 05/15/2013 0514    Hgb A1C Lab Results  Component Value Date   HGBA1C 5.0 04/25/2015      Assessment and Plan:   Medicare Annual Wellness Visit:  Diet: Low fat, low salt Physical activity: Sedentery Depression/mood screen: Negative Hearing: Intact to whispered voice Visual acuity: Grossly normal, performs annual eye exam  ADLs: Capable Fall risk: None Home safety: Good Cognitive evaluation: Intact to orientation, naming, recall and repetition EOL planning: No adv directives, full code/ I agree  Preventative Medicine: She will contact insurance company about shingles vaccine. BMD due in 2018. Mammogram UTD. She never had colonoscopy and one is not indicated at this time. Encouraged her to continue to see an eye doctor and dentist annually.   Next appointment: 6 months to follow up chronic conditions  Bilateral conjunctivitis:  Continue Polytrim Continue warm compresses 80 mg Depo IM today

## 2015-08-13 NOTE — Progress Notes (Signed)
Pre visit review using our clinic review tool, if applicable. No additional management support is needed unless otherwise documented below in the visit note. 

## 2015-08-13 NOTE — Patient Instructions (Signed)

## 2015-10-29 ENCOUNTER — Other Ambulatory Visit: Payer: Self-pay | Admitting: Internal Medicine

## 2015-12-11 ENCOUNTER — Encounter: Payer: Self-pay | Admitting: Internal Medicine

## 2015-12-11 ENCOUNTER — Ambulatory Visit (INDEPENDENT_AMBULATORY_CARE_PROVIDER_SITE_OTHER): Payer: PPO | Admitting: Internal Medicine

## 2015-12-11 VITALS — BP 122/78 | HR 74 | Temp 97.9°F | Wt 171.0 lb

## 2015-12-11 DIAGNOSIS — R5382 Chronic fatigue, unspecified: Secondary | ICD-10-CM

## 2015-12-11 DIAGNOSIS — E785 Hyperlipidemia, unspecified: Secondary | ICD-10-CM | POA: Diagnosis not present

## 2015-12-11 DIAGNOSIS — I482 Chronic atrial fibrillation, unspecified: Secondary | ICD-10-CM

## 2015-12-11 DIAGNOSIS — G4700 Insomnia, unspecified: Secondary | ICD-10-CM | POA: Diagnosis not present

## 2015-12-11 MED ORDER — ALPRAZOLAM 0.25 MG PO TABS: 0.2500 mg | ORAL_TABLET | Freq: Every evening | ORAL | Status: DC | PRN

## 2015-12-11 NOTE — Progress Notes (Signed)
Pre visit review using our clinic review tool, if applicable. No additional management support is needed unless otherwise documented below in the visit note. 

## 2015-12-11 NOTE — Progress Notes (Signed)
Subjective:    Patient ID: Jill Snow, female    DOB: 1934-03-14, 82 y.o.   MRN: [id number]  HPI  Pt presents to the clinic today with c/o insomnia. This has been a chronic issue for her. She wears a CPAP at night, and reports that some nights (3-4/wk) she can feel the air blowing around the nasal mask, and it keeps her awake all night long. She has tried Ambien, Melatonin and Trazodone to help her sleep without success. She reports the only thing that helps her sleep at night is Xanax.   Additionally, pt would like her lipids checked since it has been a while since she last had them checked. She is taking Zocor and Zetia and tries to consume a low fat diet.  Additionally, she reports episodes of A fib. She is taking Amiodarone. She denies chest pain or shortness of breath. She does follow with cardiology, Dr. Rockey Situ.  Review of Systems   Past Medical History  Diagnosis Date  . PAF (paroxysmal atrial fibrillation) ([place])     a. initial episode 2011; b. not on long term anticoagulation since 06/2013  . Hypertension   . Hypercholesterolemia   . Mitral regurgitation     a. echo 2014: EF 40-45%, mildly dilated RV, mod reduced RV systolic fxn, mod dilated LA, Mild to mod MR, mod TR, mildly elevated PASP  . Dyspnea   . Seasonal allergies   . Sleep apnea     a. on CPAP  . RBBB (right bundle branch block)   . History of chicken pox   . Atrial flutter ([place])     a. s/p successful TEE/DCCV in 2014; b. TEE 2014 showed EF 45-50%, mild bi-atrial enlargement, mod MR  . Atrial fibrillation [facility])     Current Outpatient Prescriptions  Medication Sig Dispense Refill  . amiodarone (PACERONE) 200 MG tablet TAKE ONE TABLET BY MOUTH EVERY DAY 30 tablet 8  . ezetimibe (ZETIA) 10 MG tablet Take 1 tablet (10 mg total) by mouth daily. 30 tablet 2  . fexofenadine (ALLEGRA) 180 MG tablet TAKE ONE TABLET EVERY DAY 30 tablet 11  . fluticasone (FLONASE) 50 MCG/ACT nasal spray TAKE 2 SPRAYS IN EACH  NOSTRIL EVERY DAY 16 g 5  . lisinopril (PRINIVIL,ZESTRIL) 10 MG tablet TAKE ONE TABLET EVERY DAY 30 tablet 3  . montelukast (SINGULAIR) 10 MG tablet Take 1 tablet (10 mg total) by mouth daily. 30 tablet 5  . simvastatin (ZOCOR) 40 MG tablet Take 1 tablet (40 mg total) by mouth at bedtime. SCHEDULE LAB ONLY APPOINTMENT 30 tablet 1  . Vitamin D, Ergocalciferol, (DRISDOL) 50000 UNITS CAPS capsule Take 1 capsule (50,000 Units total) by mouth every 7 (seven) days. 12 capsule 3  . ALPRAZolam (XANAX) 0.25 MG tablet Take 1 tablet (0.25 mg total) by mouth at bedtime as needed for anxiety. 30 tablet 0   No current facility-administered medications for this visit.    Allergies  Allergen Reactions  . Codeine Anaphylaxis    "cant remember reaction"  . Augmentin [Amoxicillin-Pot Clavulanate] Nausea And Vomiting  . Crestor [Rosuvastatin] Other (See Comments)    Muscle pain/cramps     Family History  Problem Relation Age of Onset  . Stroke Mother   . Heart attack Mother   . Arthritis Mother   . Hyperlipidemia Mother   . Heart disease Mother   . Diabetes Mother   . Diabetes Sister   . Cancer Daughter   . Heart disease Daughter   .  Diabetes Daughter   . Diabetes Brother   . Diabetes Son   . Diabetes Maternal Grandmother   . Diabetes Maternal Grandfather     Social History   Social History  . Marital Status: Widowed    Spouse Name: N/A  . Number of Children: 2  . Years of Education: N/A   Occupational History  . REALTOR    Social History Main Topics  . Smoking status: Never Smoker   . Smokeless tobacco: Never Used  . Alcohol Use: No  . Drug Use: No  . Sexual Activity: No   Other Topics Concern  . Not on file   Social History Narrative     Constitutional: Positive for fatigue. Denies fever, headache or abrupt weight changes.  Respiratory: Denies difficulty breathing, shortness of breath, or cough.   Cardiovascular: Occasional fluttering in her chest. Denies chest pain,  chest tightness, or swelling in the hands or feet.   No other specific complaints in a complete review of systems (except as listed in HPI above).     Objective:   Physical Exam  BP 122/78 mmHg  Pulse 74  Temp(Src) 97.9 F (36.6 C) (Oral)  Wt 171 lb (77.565 kg)  SpO2 98% Wt Readings from Last 3 Encounters:  12/11/15 171 lb (77.565 kg)  08/13/15 170 lb (77.111 kg)  08/08/15 169 lb (76.658 kg)    General: Appears her stated age, chronically ill appearing, in NAD. Skin: Warm, dry and intact. No rashes, lesions or ulcerations noted. Cardiovascular: Normal rate and rhythm. S1,S2 noted.  No murmur, rubs or gallops noted. No JVD or BLE edema.  Pulmonary/Chest: Normal effort and positive vesicular breath sounds. No respiratory distress. No wheezes, rales or ronchi noted.  Neuro: She is alert and oriented. Psych: She is anxious today.    BMET    Component Value Date/Time   NA 140 07/08/2015 0913   NA 139 05/15/2013 0514   K 3.9 07/08/2015 0913   K 3.7 05/15/2013 0514   CL 104 07/08/2015 0913   CL 105 05/15/2013 0514   CO2 27 07/08/2015 0913   CO2 30 05/15/2013 0514   GLUCOSE 96 07/08/2015 0913   GLUCOSE 92 05/15/2013 0514   BUN 14 07/08/2015 0913   BUN 11 05/15/2013 0514   CREATININE 0.80 07/08/2015 0913   CREATININE 1.25 05/15/2013 0514   CALCIUM 9.7 07/08/2015 0913   CALCIUM 9.5 05/15/2013 0514   GFRNONAA >60 03/30/2015 1038   GFRNONAA 41* 05/15/2013 0514   GFRAA >60 03/30/2015 1038   GFRAA 48* 05/15/2013 0514    Lipid Panel     Component Value Date/Time   CHOL 267* 07/08/2015 0913   CHOL 281* 03/07/2014 0832   TRIG 107.0 07/08/2015 0913   HDL 55.00 07/08/2015 0913   HDL 49 03/07/2014 0832   CHOLHDL 5 07/08/2015 0913   CHOLHDL 5.7* 03/07/2014 0832   VLDL 21.4 07/08/2015 0913   LDLCALC 191* 07/08/2015 0913   LDLCALC 202* 03/07/2014 0832    CBC    Component Value Date/Time   WBC 4.6 07/08/2015 0913   WBC 4.9 05/15/2013 0514   RBC 4.46 07/08/2015 0913     RBC 4.73 05/15/2013 0514   HGB 14.1 07/08/2015 0913   HGB 14.6 05/15/2013 0514   HCT 42.3 07/08/2015 0913   HCT 43.6 05/15/2013 0514   PLT 179.0 07/08/2015 0913   PLT 171 05/15/2013 0514   MCV 94.9 07/08/2015 0913   MCV 92 05/15/2013 0514   MCH 32.4 03/30/2015 1038  MCH 30.9 05/15/2013 0514   MCHC 33.4 07/08/2015 0913   MCHC 33.5 05/15/2013 0514   RDW 12.7 07/08/2015 0913   RDW 13.9 05/15/2013 0514   LYMPHSABS 0.8* 03/30/2015 1038   LYMPHSABS 1.5 05/15/2013 0514   MONOABS 0.8 03/30/2015 1038   MONOABS 0.4 05/15/2013 0514   EOSABS 0.2 03/30/2015 1038   EOSABS 0.3 05/15/2013 0514   BASOSABS 0.1 03/30/2015 1038   BASOSABS 0.1 05/15/2013 0514    Hgb A1C Lab Results  Component Value Date   HGBA1C 5.0 04/25/2015         Assessment & Plan:   Fatigue, secondary to Insomnia:  Reviewed TSH, B12 and Vit D levels RX for Xanax 0.25 qhs F/U with pulmonology about CPAP equipment  A. Fib:  She declines ECG today Continue Amiodarone F/U with cardiology  HLD:  Encouraged her to consume a low fat diet Check lipid and CMET  RTC 07/2016 for annual exam

## 2015-12-11 NOTE — Patient Instructions (Signed)
Insomnia Insomnia is a sleep disorder that makes it difficult to fall asleep or to stay asleep. Insomnia can cause tiredness (fatigue), low energy, difficulty concentrating, mood swings, and poor performance at work or school.  There are three different ways to classify insomnia:  Difficulty falling asleep.  Difficulty staying asleep.  Waking up too early in the morning. Any type of insomnia can be long-term (chronic) or short-term (acute). Both are common. Short-term insomnia usually lasts for three months or less. Chronic insomnia occurs at least three times a week for longer than three months. CAUSES  Insomnia may be caused by another condition, situation, or substance, such as:  Anxiety.  Certain medicines.  Gastroesophageal reflux disease (GERD) or other gastrointestinal conditions.  Asthma or other breathing conditions.  Restless legs syndrome, sleep apnea, or other sleep disorders.  Chronic pain.  Menopause. This may include hot flashes.  Stroke.  Abuse of alcohol, tobacco, or illegal drugs.  Depression.  Caffeine.   Neurological disorders, such as Alzheimer disease.  An overactive thyroid (hyperthyroidism). The cause of insomnia may not be known. RISK FACTORS Risk factors for insomnia include:  Gender. Women are more commonly affected than men.  Age. Insomnia is more common as you get older.  Stress. This may involve your professional or personal life.  Income. Insomnia is more common in people with lower income.  Lack of exercise.   Irregular work schedule or night shifts.  Traveling between different time zones. SIGNS AND SYMPTOMS If you have insomnia, trouble falling asleep or trouble staying asleep is the main symptom. This may lead to other symptoms, such as:  Feeling fatigued.  Feeling nervous about going to sleep.  Not feeling rested in the morning.  Having trouble concentrating.  Feeling irritable, anxious, or depressed. TREATMENT   Treatment for insomnia depends on the cause. If your insomnia is caused by an underlying condition, treatment will focus on addressing the condition. Treatment may also include:   Medicines to help you sleep.  Counseling or therapy.  Lifestyle adjustments. HOME CARE INSTRUCTIONS   Take medicines only as directed by your health care provider.  Keep regular sleeping and waking hours. Avoid naps.  Keep a sleep diary to help you and your health care provider figure out what could be causing your insomnia. Include:   When you sleep.  When you wake up during the night.  How well you sleep.   How rested you feel the next day.  Any side effects of medicines you are taking.  What you eat and drink.   Make your bedroom a comfortable place where it is easy to fall asleep:  Put up shades or special blackout curtains to block light from outside.  Use a white noise machine to block noise.  Keep the temperature cool.   Exercise regularly as directed by your health care provider. Avoid exercising right before bedtime.  Use relaxation techniques to manage stress. Ask your health care provider to suggest some techniques that may work well for you. These may include:  Breathing exercises.  Routines to release muscle tension.  Visualizing peaceful scenes.  Cut back on alcohol, caffeinated beverages, and cigarettes, especially close to bedtime. These can disrupt your sleep.  Do not overeat or eat spicy foods right before bedtime. This can lead to digestive discomfort that can make it hard for you to sleep.  Limit screen use before bedtime. This includes:  Watching TV.  Using your smartphone, tablet, and computer.  Stick to a routine. This   can help you fall asleep faster. Try to do a quiet activity, brush your teeth, and go to bed at the same time each night.  Get out of bed if you are still awake after 15 minutes of trying to sleep. Keep the lights down, but try reading or  doing a quiet activity. When you feel sleepy, go back to bed.  Make sure that you drive carefully. Avoid driving if you feel very sleepy.  Keep all follow-up appointments as directed by your health care provider. This is important. SEEK MEDICAL CARE IF:   You are tired throughout the day or have trouble in your daily routine due to sleepiness.  You continue to have sleep problems or your sleep problems get worse. SEEK IMMEDIATE MEDICAL CARE IF:   You have serious thoughts about hurting yourself or someone else.   This information is not intended to replace advice given to you by your health care provider. Make sure you discuss any questions you have with your health care provider.   Document Released: 07/31/2000 Document Revised: 04/24/2015 Document Reviewed: 05/04/2014 Elsevier Interactive Patient Education 2016 Elsevier Inc.  

## 2016-01-07 ENCOUNTER — Ambulatory Visit (INDEPENDENT_AMBULATORY_CARE_PROVIDER_SITE_OTHER): Payer: PPO | Admitting: Cardiovascular Disease

## 2016-01-07 ENCOUNTER — Encounter: Payer: Self-pay | Admitting: Cardiovascular Disease

## 2016-01-07 ENCOUNTER — Telehealth: Payer: Self-pay | Admitting: Internal Medicine

## 2016-01-07 VITALS — BP 130/60 | HR 64 | Ht 67.0 in | Wt 175.0 lb

## 2016-01-07 DIAGNOSIS — I482 Chronic atrial fibrillation, unspecified: Secondary | ICD-10-CM

## 2016-01-07 DIAGNOSIS — E7800 Pure hypercholesterolemia, unspecified: Secondary | ICD-10-CM

## 2016-01-07 DIAGNOSIS — I480 Paroxysmal atrial fibrillation: Secondary | ICD-10-CM

## 2016-01-07 DIAGNOSIS — I4510 Unspecified right bundle-branch block: Secondary | ICD-10-CM

## 2016-01-07 DIAGNOSIS — I4892 Unspecified atrial flutter: Secondary | ICD-10-CM | POA: Diagnosis not present

## 2016-01-07 DIAGNOSIS — I10 Essential (primary) hypertension: Secondary | ICD-10-CM

## 2016-01-07 DIAGNOSIS — G4733 Obstructive sleep apnea (adult) (pediatric): Secondary | ICD-10-CM

## 2016-01-07 DIAGNOSIS — Z9989 Dependence on other enabling machines and devices: Secondary | ICD-10-CM

## 2016-01-07 MED ORDER — EZETIMIBE 10 MG PO TABS: 10.0000 mg | ORAL_TABLET | Freq: Every day | ORAL | Status: DC

## 2016-01-07 NOTE — Assessment & Plan Note (Signed)
Unable to tolerate CPAP Reports that she sleeps well Denies excessive fatigue

## 2016-01-07 NOTE — Assessment & Plan Note (Signed)
Discussed right bundle branch block with her in detail, currently not a concern   Total encounter time more than 25 minutes  Greater than 50% was spent in counseling and coordination of care with the patient

## 2016-01-07 NOTE — Assessment & Plan Note (Signed)
Blood pressure is well controlled on today's visit. No changes made to the medications. 

## 2016-01-07 NOTE — Telephone Encounter (Signed)
Ivin Booty @ [facility] with Dr Donivan Scull called  Pt wants to transfer care from [facility] to dr Josephina Gip She spoke with [place] office they  told her to call here to put note to transfer Is it is ok schedule??   Lorriane Shire can you help pt with this Thanks

## 2016-01-07 NOTE — Patient Instructions (Addendum)
You are doing well.  We will schedule a CT coronary calcium score for family hx of CAD $150  [facility],  [address], [place]  Please call when you are ready to schedule  Please start zetia one a  Day  We will make an appt for primary care at [facility]  Please call us if you have new issues that need to be addressed before your next appt.  Your physician wants you to follow-up in: 6 months.  You will receive a reminder letter in the mail two months in advance. If you don't receive a letter, please call our office to schedule the follow-up appointment.

## 2016-01-07 NOTE — Assessment & Plan Note (Signed)
Rhythm well controlled, no symptoms concerning for atrial fibrillation We'll continue amiodarone with surveillance lab work TSH LFTs on annual basis

## 2016-01-07 NOTE — Progress Notes (Signed)
Patient ID: Jill Snow, female    DOB: 1934-05-06, 82 y.o.   MRN: [id number]  HPI Comments: Mrs. Odea is a pleasant 82 year old woman with a history of paroxysmal atrial fibrillation, episode in July 2011 , also history of hypertension and hypercholesterolemia, diagnosed with  sleep apnea, uses nasal pillow CPAP and oxygen therapy at night. She presents for follow-up of her atrial fibrillation   in follow-up today, she reports that she is doing well She would like to change primary care providers as she lives in [place], leads a busy lifestyle, continues to work She has expressed interest in aggressive medical care such as mammograms, lab work Social research officer, government. Ports having family history of breast cancer, daughter  Long discussion today concerning family history, mother with coronary disease. She was a smoker and diabetic. Patient's cholesterol is elevated ranging between 230 and 270 She is tolerating simvastatin 40 mg daily Zetia was previously prescribed for some reason she is not taking this, she does not know why. She would like to be aggressive with her cholesterol  She denies any symptoms concerning for atrial fibrillation Rarely has fatigue. Continues to work in Scientist, research (life sciences) estate   denies any significant shortness of breath or chest pain Currently not using CPAP. Machine is not working.  EKG on today's visit shows normal sinus rhythm with rate 64 bpm, right bundle branch block  Other past medical history hospitalization 05/14/2013 for palpitations, shortness of breath with tachycardia. She had upper respiratory infection and was found to be in atrial flutter. She was given IV Cardizem with minimal improvement of her heart rate. She was hypotensive and required several days in the hospital for management. She ended up needing TEE and cardioversion as rate was difficult to control despite metoprolol, aspirin channel blocker and digoxin. She was started on anticoagulation at that time.    TEE dated 05/16/2013 showed ejection fraction 45-50%, mildly dilated left and right atrium, moderate mitral valve regurgitation noted Chest x-ray in the hospital did show small bilateral pleural effusions    Allergies  Allergen Reactions  . Codeine Anaphylaxis    "cant remember reaction"  . Augmentin [Amoxicillin-Pot Clavulanate] Nausea And Vomiting  . Crestor [Rosuvastatin] Other (See Comments)    Muscle pain/cramps     Current Outpatient Prescriptions on File Prior to Visit  Medication Sig Dispense Refill  . ALPRAZolam (XANAX) 0.25 MG tablet Take 1 tablet (0.25 mg total) by mouth at bedtime as needed for anxiety. 30 tablet 0  . amiodarone (PACERONE) 200 MG tablet TAKE ONE TABLET BY MOUTH EVERY DAY 30 tablet 8  . fexofenadine (ALLEGRA) 180 MG tablet TAKE ONE TABLET EVERY DAY 30 tablet 11  . fluticasone (FLONASE) 50 MCG/ACT nasal spray TAKE 2 SPRAYS IN EACH NOSTRIL EVERY DAY 16 g 5  . lisinopril (PRINIVIL,ZESTRIL) 10 MG tablet TAKE ONE TABLET EVERY DAY 30 tablet 3  . montelukast (SINGULAIR) 10 MG tablet Take 1 tablet (10 mg total) by mouth daily. 30 tablet 5  . simvastatin (ZOCOR) 40 MG tablet Take 1 tablet (40 mg total) by mouth at bedtime. SCHEDULE LAB ONLY APPOINTMENT 30 tablet 1  . Vitamin D, Ergocalciferol, (DRISDOL) 50000 UNITS CAPS capsule Take 1 capsule (50,000 Units total) by mouth every 7 (seven) days. 12 capsule 3   No current facility-administered medications on file prior to visit.    Past Medical History  Diagnosis Date  . PAF (paroxysmal atrial fibrillation) ([place])     a. initial episode 2011; b. not on long term anticoagulation since  06/2013  . Hypertension   . Hypercholesterolemia   . Mitral regurgitation     a. echo 2014: EF 40-45%, mildly dilated RV, mod reduced RV systolic fxn, mod dilated LA, Mild to mod MR, mod TR, mildly elevated PASP  . Dyspnea   . Seasonal allergies   . Sleep apnea     a. on CPAP  . RBBB (right bundle branch block)   . History of  chicken pox   . Atrial flutter ([place])     a. s/p successful TEE/DCCV in 2014; b. TEE 2014 showed EF 45-50%, mild bi-atrial enlargement, mod MR  . Atrial fibrillation [facility])     Past Surgical History  Procedure Laterality Date  . Abdominal hysterectomy    . Tummy tuck    . Tonsillectomy and adenoidectomy  1947    Social History  reports that she has never smoked. She has never used smokeless tobacco. She reports that she does not drink alcohol or use illicit drugs.  Family History family history includes Arthritis in her mother; Cancer in her daughter; Diabetes in her brother, daughter, maternal grandfather, maternal grandmother, mother, sister, and son; Heart attack in her mother; Heart disease in her daughter and mother; Hyperlipidemia in her mother; Stroke in her mother.   Review of Systems  Constitutional: Negative.   Respiratory: Negative.   Cardiovascular: Negative.   Gastrointestinal: Negative.   Musculoskeletal: Negative.   Skin: Negative.   Neurological: Negative.   Hematological: Negative.   Psychiatric/Behavioral: Negative.   All other systems reviewed and are negative.   BP 130/60 mmHg  Pulse 64  Ht 5\' 7"  (1.702 m)  Wt 175 lb (79.379 kg)  BMI 27.40 kg/m2  Physical Exam  Constitutional: She is oriented to person, place, and time. She appears well-developed and well-nourished.  HENT:  Head: Normocephalic.  Nose: Nose normal.  Mouth/Throat: Oropharynx is clear and moist.  Eyes: Conjunctivae are normal. Pupils are equal, round, and reactive to light.  Neck: Normal range of motion. Neck supple. No JVD present.  Cardiovascular: Normal rate, regular rhythm, S1 normal, S2 normal, normal heart sounds and intact distal pulses.  Exam reveals no gallop and no friction rub.   No murmur heard. Pulmonary/Chest: Effort normal and breath sounds normal. No respiratory distress. She has no wheezes. She has no rales. She exhibits no tenderness.  Abdominal: Soft. Bowel sounds  are normal. She exhibits no distension. There is no tenderness.  Musculoskeletal: Normal range of motion. She exhibits no edema or tenderness.  Lymphadenopathy:    She has no cervical adenopathy.  Neurological: She is alert and oriented to person, place, and time. Coordination normal.  Skin: Skin is warm and dry. No rash noted. No erythema.  Psychiatric: She has a normal mood and affect. Her behavior is normal. Judgment and thought content normal.    Assessment and Plan  Nursing note and vitals reviewed.

## 2016-01-07 NOTE — Telephone Encounter (Signed)
Fine with me

## 2016-01-07 NOTE — Assessment & Plan Note (Signed)
Long discussion concerning her hyperlipidemia and strong family history of coronary artery disease Recommended that she continue on her simvastatin, start Zetia daily CT coronary calcium score discussed with her She would like to do this for risk stratification sometime in June given strong family history If she does have significant underlying coronary artery disease, we would probably change her to Crestor 40 mg daily with zetia daily

## 2016-01-08 NOTE — Telephone Encounter (Signed)
Dr. Caryl Bis can I schedule this patient with you.

## 2016-01-08 NOTE — Telephone Encounter (Signed)
Please schedule patient with Dr. Caryl Bis.

## 2016-01-08 NOTE — Telephone Encounter (Signed)
Jill Snow can you help pt with this Thanks

## 2016-01-08 NOTE — Telephone Encounter (Signed)
Okay with me 

## 2016-01-09 ENCOUNTER — Other Ambulatory Visit: Payer: PPO

## 2016-01-09 NOTE — Telephone Encounter (Signed)
Pt is scheduled °

## 2016-01-20 ENCOUNTER — Telehealth: Payer: Self-pay | Admitting: Cardiovascular Disease

## 2016-01-20 NOTE — Telephone Encounter (Signed)
Error

## 2016-02-10 ENCOUNTER — Ambulatory Visit (INDEPENDENT_AMBULATORY_CARE_PROVIDER_SITE_OTHER): Payer: PPO | Admitting: Family Medicine

## 2016-02-10 ENCOUNTER — Encounter: Payer: Self-pay | Admitting: Family Medicine

## 2016-02-10 VITALS — BP 122/70 | HR 65 | Temp 97.9°F | Ht 67.0 in | Wt 174.8 lb

## 2016-02-10 DIAGNOSIS — I482 Chronic atrial fibrillation, unspecified: Secondary | ICD-10-CM

## 2016-02-10 DIAGNOSIS — G629 Polyneuropathy, unspecified: Secondary | ICD-10-CM | POA: Diagnosis not present

## 2016-02-10 DIAGNOSIS — J302 Other seasonal allergic rhinitis: Secondary | ICD-10-CM

## 2016-02-10 DIAGNOSIS — I480 Paroxysmal atrial fibrillation: Secondary | ICD-10-CM

## 2016-02-10 LAB — COMPREHENSIVE METABOLIC PANEL
ALK PHOS: 55 U/L (ref 39–117)
ALT: 29 U/L (ref 0–35)
AST: 40 U/L — AB (ref 0–37)
Albumin: 4.5 g/dL (ref 3.5–5.2)
BILIRUBIN TOTAL: 0.8 mg/dL (ref 0.2–1.2)
BUN: 14 mg/dL (ref 6–23)
CALCIUM: 9.7 mg/dL (ref 8.4–10.5)
CO2: 30 mEq/L (ref 19–32)
Chloride: 105 mEq/L (ref 96–112)
Creatinine, Ser: 0.93 mg/dL (ref 0.40–1.20)
GFR: 61.62 mL/min (ref >60.00)
GLUCOSE: 111 mg/dL — AB (ref 70–99)
POTASSIUM: 3.9 meq/L (ref 3.5–5.1)
Sodium: 140 mEq/L (ref 135–145)
TOTAL PROTEIN: 7.2 g/dL (ref 6.0–8.3)

## 2016-02-10 LAB — HEMOGLOBIN A1C: Hgb A1c MFr Bld: 5.1 % (ref 4.6–6.5)

## 2016-02-10 LAB — VITAMIN B12: VITAMIN B 12: 287 pg/mL (ref 211–911)

## 2016-02-10 LAB — TSH: TSH: 2.09 u[IU]/mL (ref 0.35–4.50)

## 2016-02-10 NOTE — Patient Instructions (Addendum)
Nice to meet you. We are going to obtain some lab work to evaluate the numbness that she get in her feet. You can trial Flonase and continue Claritin for ear congestion. You can do Flonase 2 sprays each nostril daily. If your congestion and sinus issues do not improve please let us know. If you develop numbness elsewhere, or develop weakness, or any new or changing symptoms please seek medical attention.

## 2016-02-10 NOTE — Progress Notes (Signed)
Pre visit review using our clinic review tool, if applicable. No additional management support is needed unless otherwise documented below in the visit note. 

## 2016-02-11 DIAGNOSIS — G629 Polyneuropathy, unspecified: Secondary | ICD-10-CM | POA: Insufficient documentation

## 2016-02-11 NOTE — Assessment & Plan Note (Signed)
Symptoms consistent with likely allergies. Could have had a viral illness as well. She is improving overall. Flonase and Claritin over-the-counter. She'll continue to monitor. Given return precautions.

## 2016-02-11 NOTE — Assessment & Plan Note (Signed)
Patient's symptoms seem to be consistent with neuropathy in her bilateral feet. Unknown cause at this time. It has coincided with increased sugary drink intake. We'll check lab work as outlined below. She'll continue to monitor. She is given return precautions.

## 2016-02-11 NOTE — Progress Notes (Signed)
Patient ID: Jill Snow, female   DOB: 25-Feb-1934, 82 y.o.   MRN: [id number]  [name], MD Phone: [phone]  Jill Snow is a 82 y.o. female who presents today for follow-up.  Congestion: Patient notes for the last 3 weeks she's had some upper respiratory congestion. Postnasal drip. Some cough. Was coughing up green mucus. Blowing clear mucus out of her nose. Cough is improving. Less green mucus. No fevers. No ear pain. No shortness of breath. Taking Sudafed. Has been improving.  A. fib: Rarely gets palpitations. Is on amiodarone. No blood thinner. Followed by cardiology.  Patient notes her feet are falling asleep occasionally. Just the bottom of her feet. No polyuria or polydipsia. This started after she started drinking 5 Cokes a day. Notes her mother had diabetes.  PMH: nonsmoker.   ROS see history of present illness  Objective  Physical Exam Filed Vitals:   02/10/16 1412  BP: 122/70  Pulse: 65  Temp: 97.9 F (36.6 C)    BP Readings from Last 3 Encounters:  02/10/16 122/70  01/07/16 130/60  12/11/15 122/78   Wt Readings from Last 3 Encounters:  02/10/16 174 lb 12.8 oz (79.289 kg)  01/07/16 175 lb (79.379 kg)  12/11/15 171 lb (77.565 kg)    Physical Exam  Constitutional: She is well-developed, well-nourished, and in no distress.  HENT:  Head: Normocephalic and atraumatic.  Right Ear: External ear normal.  Left Ear: External ear normal.  Mouth/Throat: Oropharynx is clear and moist. No oropharyngeal exudate.  Normal TMs  Eyes: Conjunctivae are normal. Pupils are equal, round, and reactive to light.  Neck: Neck supple.  Cardiovascular: Normal rate, regular rhythm and normal heart sounds.   2+ DP pulses  Pulmonary/Chest: Effort normal and breath sounds normal.  Lymphadenopathy:    She has no cervical adenopathy.  Neurological: She is alert. Gait normal.  CN 2-12 intact, 5/5 strength in bilateral biceps, triceps, grip, quads, hamstrings,  plantar and dorsiflexion, sensation to light touch intact in bilateral UE and LE, normal gait, 2+ patellar reflexes, absent monofilament testing on bilateral balls of feet  Skin: Skin is warm and dry. She is not diaphoretic.     Assessment/Plan: Please see individual problem list.  Atrial fibrillation Sinus rhythm today rate controlled. Continue to monitor.  PAF (paroxysmal atrial fibrillation) Sinus rhythm. Continue to follow with cardiology.  Seasonal allergies Symptoms consistent with likely allergies. Could have had a viral illness as well. She is improving overall. Flonase and Claritin over-the-counter. She'll continue to monitor. Given return precautions.  Neuropathy (HCC) Patient's symptoms seem to be consistent with neuropathy in her bilateral feet. Unknown cause at this time. It has coincided with increased sugary drink intake. We'll check lab work as outlined below. She'll continue to monitor. She is given return precautions.    Orders Placed This Encounter  Procedures  . HgB A1c  . TSH  . Comp Met (CMET)  . B12    [name], MD [place]

## 2016-02-11 NOTE — Assessment & Plan Note (Signed)
Sinus rhythm today rate controlled. Continue to monitor.

## 2016-02-11 NOTE — Assessment & Plan Note (Signed)
Sinus rhythm.  Continue to follow with cardiology. 

## 2016-02-13 ENCOUNTER — Other Ambulatory Visit: Payer: Self-pay | Admitting: Family Medicine

## 2016-02-13 DIAGNOSIS — R7989 Other specified abnormal findings of blood chemistry: Secondary | ICD-10-CM

## 2016-02-13 DIAGNOSIS — R945 Abnormal results of liver function studies: Principal | ICD-10-CM

## 2016-02-14 ENCOUNTER — Telehealth: Payer: Self-pay | Admitting: Family Medicine

## 2016-02-14 NOTE — Telephone Encounter (Signed)
Will await call back as more information is needed. She was improving when I saw her previously.

## 2016-02-14 NOTE — Telephone Encounter (Signed)
FYI Attempted to call patient for more information regarding symptoms.  No answer. Left a message to call the office back.

## 2016-02-14 NOTE — Telephone Encounter (Signed)
Pt called to request Dr. Caryl Bis call in an antibiotic for her congestion.She states that she thought it was going to get better when she came to see him so she did not ask. [facility].

## 2016-02-20 ENCOUNTER — Ambulatory Visit: Payer: PPO

## 2016-03-05 ENCOUNTER — Other Ambulatory Visit: Payer: Self-pay | Admitting: Internal Medicine

## 2016-03-06 NOTE — Telephone Encounter (Signed)
Pt est care w/ you 01/2016--no longer Baity pt--please advise if okay to refill

## 2016-03-25 ENCOUNTER — Ambulatory Visit: Payer: PPO | Admitting: Physician Assistant

## 2016-04-02 ENCOUNTER — Telehealth: Payer: Self-pay | Admitting: Family Medicine

## 2016-04-02 ENCOUNTER — Ambulatory Visit: Payer: PPO | Admitting: Cardiovascular Disease

## 2016-04-02 NOTE — Telephone Encounter (Signed)
Linus Orn called from [facility] [phone] called to follow up on C pap supply that was faxed today it was faxed previously on 8/07. Once signed faxed back to [phone]. Thank you!

## 2016-04-02 NOTE — Telephone Encounter (Signed)
This was faxed on 03/27/16 and faxed again today.

## 2016-04-02 NOTE — Telephone Encounter (Signed)
Please advise.  Im oretty sure it has already been completed by you.

## 2016-04-15 ENCOUNTER — Ambulatory Visit
Admission: RE | Admit: 2016-04-15 | Discharge: 2016-04-15 | Disposition: A | Discharge status: Home / Self Care | Payer: PPO | Source: Ambulatory Visit | Attending: Family Medicine | Admitting: Family Medicine

## 2016-04-15 DIAGNOSIS — R932 Abnormal findings on diagnostic imaging of liver and biliary tract: Secondary | ICD-10-CM | POA: Insufficient documentation

## 2016-04-15 DIAGNOSIS — R7989 Other specified abnormal findings of blood chemistry: Secondary | ICD-10-CM | POA: Insufficient documentation

## 2016-04-15 DIAGNOSIS — R945 Abnormal results of liver function studies: Secondary | ICD-10-CM

## 2016-04-16 ENCOUNTER — Other Ambulatory Visit: Payer: Self-pay | Admitting: Family Medicine

## 2016-04-16 DIAGNOSIS — K760 Fatty (change of) liver, not elsewhere classified: Secondary | ICD-10-CM

## 2016-04-17 ENCOUNTER — Other Ambulatory Visit (INDEPENDENT_AMBULATORY_CARE_PROVIDER_SITE_OTHER): Payer: PPO

## 2016-04-17 DIAGNOSIS — K760 Fatty (change of) liver, not elsewhere classified: Secondary | ICD-10-CM

## 2016-04-17 LAB — HEPATITIS B SURFACE ANTIGEN: Hepatitis B Surface Ag: NEGATIVE

## 2016-04-17 LAB — HEPATITIS B SURFACE ANTIBODY,QUALITATIVE: Hep B S Ab: NEGATIVE

## 2016-04-17 LAB — HEPATITIS C ANTIBODY: HCV AB: NEGATIVE

## 2016-04-17 LAB — HEPATITIS B CORE ANTIBODY, TOTAL: HEP B C TOTAL AB: NONREACTIVE

## 2016-04-17 LAB — HEPATITIS A ANTIBODY, TOTAL: Hep A Total Ab: NONREACTIVE

## 2016-04-22 ENCOUNTER — Ambulatory Visit (INDEPENDENT_AMBULATORY_CARE_PROVIDER_SITE_OTHER): Payer: PPO

## 2016-04-22 DIAGNOSIS — K760 Fatty (change of) liver, not elsewhere classified: Secondary | ICD-10-CM | POA: Diagnosis not present

## 2016-04-22 DIAGNOSIS — Z23 Encounter for immunization: Secondary | ICD-10-CM

## 2016-04-22 DIAGNOSIS — K7689 Other specified diseases of liver: Secondary | ICD-10-CM | POA: Diagnosis not present

## 2016-04-22 NOTE — Progress Notes (Signed)
I have reviewed and agree with the above note.  [name], M.D.

## 2016-04-22 NOTE — Progress Notes (Signed)
Patient came in for Hep A/B immunization, 1st injection given, assisted with scheduling the next injection in one month.  Then 6 months

## 2016-05-19 ENCOUNTER — Ambulatory Visit: Payer: PPO | Admitting: Cardiovascular Disease

## 2016-05-26 ENCOUNTER — Ambulatory Visit: Payer: PPO

## 2016-05-27 ENCOUNTER — Ambulatory Visit (INDEPENDENT_AMBULATORY_CARE_PROVIDER_SITE_OTHER): Payer: PPO

## 2016-05-27 DIAGNOSIS — K760 Fatty (change of) liver, not elsewhere classified: Secondary | ICD-10-CM | POA: Diagnosis not present

## 2016-05-27 DIAGNOSIS — Z23 Encounter for immunization: Secondary | ICD-10-CM

## 2016-05-27 NOTE — Progress Notes (Signed)
Patient came in for 2nd Hep A/B. Received in Right deltoid.  Tolerated well.

## 2016-07-03 ENCOUNTER — Ambulatory Visit: Payer: PPO | Admitting: Family Medicine

## 2016-07-13 ENCOUNTER — Ambulatory Visit: Payer: PPO | Admitting: Cardiovascular Disease

## 2016-07-23 ENCOUNTER — Ambulatory Visit (INDEPENDENT_AMBULATORY_CARE_PROVIDER_SITE_OTHER): Payer: PPO | Admitting: Family Medicine

## 2016-07-23 ENCOUNTER — Encounter: Payer: Self-pay | Admitting: Cardiovascular Disease

## 2016-07-23 ENCOUNTER — Ambulatory Visit (INDEPENDENT_AMBULATORY_CARE_PROVIDER_SITE_OTHER): Payer: PPO | Admitting: Cardiovascular Disease

## 2016-07-23 ENCOUNTER — Encounter: Payer: Self-pay | Admitting: Family Medicine

## 2016-07-23 VITALS — BP 124/72 | HR 61 | Temp 97.4°F | Wt 170.6 lb

## 2016-07-23 VITALS — BP 134/68 | HR 66 | Ht 66.5 in | Wt 171.2 lb

## 2016-07-23 DIAGNOSIS — I10 Essential (primary) hypertension: Secondary | ICD-10-CM

## 2016-07-23 DIAGNOSIS — I480 Paroxysmal atrial fibrillation: Secondary | ICD-10-CM

## 2016-07-23 DIAGNOSIS — K760 Fatty (change of) liver, not elsewhere classified: Secondary | ICD-10-CM | POA: Diagnosis not present

## 2016-07-23 DIAGNOSIS — E785 Hyperlipidemia, unspecified: Secondary | ICD-10-CM

## 2016-07-23 DIAGNOSIS — R7989 Other specified abnormal findings of blood chemistry: Secondary | ICD-10-CM | POA: Diagnosis not present

## 2016-07-23 DIAGNOSIS — E7800 Pure hypercholesterolemia, unspecified: Secondary | ICD-10-CM

## 2016-07-23 DIAGNOSIS — I4892 Unspecified atrial flutter: Secondary | ICD-10-CM

## 2016-07-23 DIAGNOSIS — G4733 Obstructive sleep apnea (adult) (pediatric): Secondary | ICD-10-CM

## 2016-07-23 DIAGNOSIS — G8929 Other chronic pain: Secondary | ICD-10-CM

## 2016-07-23 DIAGNOSIS — Z5181 Encounter for therapeutic drug level monitoring: Secondary | ICD-10-CM

## 2016-07-23 DIAGNOSIS — M545 Low back pain, unspecified: Secondary | ICD-10-CM | POA: Insufficient documentation

## 2016-07-23 DIAGNOSIS — Z9989 Dependence on other enabling machines and devices: Secondary | ICD-10-CM

## 2016-07-23 LAB — COMPREHENSIVE METABOLIC PANEL
ALBUMIN: 4.3 g/dL (ref 3.5–5.2)
ALK PHOS: 64 U/L (ref 39–117)
ALT: 27 U/L (ref 0–35)
AST: 39 U/L — AB (ref 0–37)
BILIRUBIN TOTAL: 1.2 mg/dL (ref 0.2–1.2)
BUN: 9 mg/dL (ref 6–23)
CALCIUM: 9.8 mg/dL (ref 8.4–10.5)
CO2: 31 meq/L (ref 19–32)
CREATININE: 0.84 mg/dL (ref 0.40–1.20)
Chloride: 104 mEq/L (ref 96–112)
GFR: 69.22 mL/min (ref >60.00)
Glucose, Bld: 98 mg/dL (ref 70–99)
Potassium: 4.2 mEq/L (ref 3.5–5.1)
Sodium: 142 mEq/L (ref 135–145)
TOTAL PROTEIN: 6.9 g/dL (ref 6.0–8.3)

## 2016-07-23 LAB — LIPID PANEL
CHOL/HDL RATIO: 7
CHOLESTEROL: 265 mg/dL — AB (ref 0–200)
HDL: 40.2 mg/dL (ref >39.00)
NonHDL: 224.92
TRIGLYCERIDES: 227 mg/dL — AB (ref 0.0–149.0)
VLDL: 45.4 mg/dL — ABNORMAL HIGH (ref 0.0–40.0)

## 2016-07-23 LAB — CBC
HCT: 42.8 % (ref 36.0–46.0)
Hemoglobin: 14.7 g/dL (ref 12.0–15.0)
MCHC: 34.4 g/dL (ref 30.0–36.0)
MCV: 93.4 fl (ref 78.0–100.0)
Platelets: 210 10*3/uL (ref 150.0–400.0)
RBC: 4.58 Mil/uL (ref 3.87–5.11)
RDW: 12.8 % (ref 11.5–15.5)
WBC: 5.1 10*3/uL (ref 4.0–10.5)

## 2016-07-23 LAB — TSH: TSH: 2.83 u[IU]/mL (ref 0.35–4.50)

## 2016-07-23 LAB — LDL CHOLESTEROL, DIRECT: LDL DIRECT: 180 mg/dL

## 2016-07-23 MED ORDER — SIMVASTATIN 40 MG PO TABS: 40.0000 mg | ORAL_TABLET | Freq: Every day | ORAL | 11 refills | Status: DC

## 2016-07-23 MED ORDER — EZETIMIBE 10 MG PO TABS: 10.0000 mg | ORAL_TABLET | Freq: Every day | ORAL | 11 refills | Status: DC

## 2016-07-23 MED ORDER — AMIODARONE HCL 200 MG PO TABS: 200.0000 mg | ORAL_TABLET | Freq: Every day | ORAL | 11 refills | Status: DC

## 2016-07-23 NOTE — Progress Notes (Signed)
Pre visit review using our clinic review tool, if applicable. No additional management support is needed unless otherwise documented below in the visit note. 

## 2016-07-23 NOTE — Assessment & Plan Note (Signed)
Tolerating simvastatin. Given family history patient could benefit from a higher intensity statin or addition of Zetia. We will check her lipid panel today. She is seeing her cardiologist later today and I have asked her to discuss this with them as well.

## 2016-07-23 NOTE — Progress Notes (Signed)
Cardiology Office Note  Date:  07/23/2016   ID:  Jill Snow, DOB 1933-11-26, MRN [id number]  PCP:  [name], MD   Chief Complaint  Patient presents with  . other    57mo f/u. Pt  Reviewed meds with pt verbally.    HPI:  Jill Snow is a pleasant 82 year old woman with a history of paroxysmal atrial fibrillation, episode in July 2011 , also history of hypertension and hypercholesterolemia, diagnosed with  sleep apnea, uses nasal pillow CPAP and oxygen therapy at night. She presents for follow-up of her atrial fibrillation  In follow-up, she reports that she is feeling well Denies any palpitations concerning for arrhythmia Off amiodarone, ran out. Also out of several of her other prescriptions including simvastatin She prefers to stay on amiodarone  denies any significant shortness of breath or chest pain Currently not using CPAP. Machine is not working.  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 She was a smoker and diabetic.  EKG on today's visit shows normal sinus rhythm with rate 66 bpm, right bundle branch block  Other past medical history hospitalization 05/14/2013 for palpitations, shortness of breath with tachycardia. She had upper respiratory infection and was found to be in atrial flutter. She was given IV Cardizem with minimal improvement of her heart rate. She was hypotensive and required several days in the hospital for management. She ended up needing TEE and cardioversion as rate was difficult to control despite metoprolol, aspirin channel blocker and digoxin. She was started on anticoagulation at that time.   TEE dated 05/16/2013 showed ejection fraction 45-50%, mildly dilated left and right atrium, moderate mitral valve regurgitation noted Chest x-ray in the hospital did show small bilateral pleural effusions    PMH:   has a past medical history of Atrial fibrillation ([place]); Atrial  flutter ([place]); Dyspnea; History of chicken pox; Hypercholesterolemia; Hypertension; Mitral regurgitation; PAF (paroxysmal atrial fibrillation) ([place]); RBBB (right bundle branch block); Seasonal allergies; and Sleep apnea.  PSH:    Past Surgical History:  Procedure Laterality Date  . ABDOMINAL HYSTERECTOMY    . TONSILLECTOMY AND ADENOIDECTOMY  1947  . tummy tuck      Current Outpatient Prescriptions  Medication Sig Dispense Refill  . ezetimibe (ZETIA) 10 MG tablet Take 1 tablet (10 mg total) by mouth daily. 30 tablet 11  . lisinopril (PRINIVIL,ZESTRIL) 10 MG tablet TAKE ONE TABLET BY MOUTH EVERY DAY 30 tablet 3  . montelukast (SINGULAIR) 10 MG tablet Take 1 tablet (10 mg total) by mouth daily. 30 tablet 5  . amiodarone (PACERONE) 200 MG tablet Take 1 tablet (200 mg total) by mouth daily. 30 tablet 11  . simvastatin (ZOCOR) 40 MG tablet Take 1 tablet (40 mg total) by mouth daily at 6 PM. 30 tablet 11   No current facility-administered medications for this visit.      Allergies:   Codeine; Augmentin [amoxicillin-pot clavulanate]; and Crestor [rosuvastatin]   Social History:  The patient  reports that she has never smoked. She has never used smokeless tobacco. She reports that she does not drink alcohol or use drugs.   Family History:   family history includes Arthritis in her mother; Cancer in her daughter; Diabetes in her brother, daughter, maternal grandfather, maternal grandmother, mother, sister, and son; Heart attack in her mother; Heart disease in her daughter and mother; Hyperlipidemia in her mother; Stroke in her mother.    Review of Systems: Review of Systems  Constitutional: Negative.  Respiratory: Negative.   Cardiovascular: Negative.   Gastrointestinal: Negative.   Musculoskeletal: Negative.   Neurological: Negative.   Psychiatric/Behavioral: Negative.   All other systems reviewed and are negative.    PHYSICAL EXAM: VS:  BP 134/68 (BP Location: Left Arm, Patient  Position: Sitting, Cuff Size: Normal)   Pulse 66   Ht 5' 6.5" (1.689 m)   Wt 171 lb 4 oz (77.7 kg)   BMI 27.23 kg/m  , BMI Body mass index is 27.23 kg/m. GEN: Well nourished, well developed, in no acute distress  HEENT: normal  Neck: no JVD, carotid bruits, or masses Cardiac: RRR; no murmurs, rubs, or gallops,no edema  Respiratory:  clear to auscultation bilaterally, normal work of breathing GI: soft, nontender, nondistended, + BS MS: no deformity or atrophy  Skin: warm and dry, no rash Neuro:  Strength and sensation are intact Psych: euthymic mood, full affect    Recent Labs: 07/23/2016: ALT 27; BUN 9; Creatinine, Ser 0.84; Hemoglobin 14.7; Platelets 210.0; Potassium 4.2; Sodium 142; TSH 2.83    Lipid Panel Lab Results  Component Value Date   CHOL 265 (H) 07/23/2016   HDL 40.20 07/23/2016   LDLCALC 191 (H) 07/08/2015   TRIG 227.0 (H) 07/23/2016      Wt Readings from Last 3 Encounters:  07/23/16 171 lb 4 oz (77.7 kg)  07/23/16 170 lb 9.6 oz (77.4 kg)  02/10/16 174 lb 12.8 oz (79.3 kg)       ASSESSMENT AND PLAN:  Atrial flutter, unspecified type ([place]) - Plan: EKG 12-Lead Long discussion with her today concerning paroxysmal atrial fibrillation/flutter Discussed stroke risk She denies any palpitations concerning for arrhythmia She will start monitoring heart rate using pulse meter on her phone If she has any irregular heart rates/rhythm, we will order event monitor  Hypercholesterolemia Ran out of her simvastatin, this has been renewed Encourage her to stay on her zetia  PAF (paroxysmal atrial fibrillation) ([place]) As above, discussed risk and benefit of anticoagulation She prefers monitoring at this time For any abnormal rhythm, recommended she call our office for event monitor  Essential hypertension Blood pressure is well controlled on today's visit. No changes made to the medications.  Obstructive sleep apnea on CPAP Previous he was not using her CPAP.  Machine was  Broken   Total encounter time more than 25 minutes  Greater than 50% was spent in counseling and coordination of care with the patient   Disposition:   F/U  12 months   Orders Placed This Encounter  Procedures  . EKG 12-Lead     Signed, Esmond Plants, M.D., Ph.D. 07/23/2016  [place], [place]

## 2016-07-23 NOTE — Assessment & Plan Note (Signed)
At goal today. Continue current medications. Lab work as outlined below.

## 2016-07-23 NOTE — Assessment & Plan Note (Signed)
This is mild in nature. Asymptomatic currently. No red flags. Neurologically intact in her lower extremities. She'll continue to monitor. Given return precautions.

## 2016-07-23 NOTE — Assessment & Plan Note (Signed)
Discuss this with patient. She will continue hepatitis A and B vaccination series. Avoid Tylenol and if uses Tylenol she will limit it to less than 2000 mg a day. Avoid alcohol use. Continue to monitor.

## 2016-07-23 NOTE — Patient Instructions (Signed)
Nice to see you. We will check some lab work today and call you with the results. Please monitor your back and if it starts to hurt more please let us know. If you develop numbness, weakness, loss of bowel or bladder function, numbness between your legs, fevers, or any new or changing symptoms please seek medical attention medially.

## 2016-07-23 NOTE — Progress Notes (Signed)
  [name], MD Phone: [phone]  Jill Snow is a 82 y.o. female who presents today for follow-up.  HYPERTENSION  Disease Monitoring  Home BP Monitoring not checking Chest pain- no    Dyspnea- no Medications  Compliance-  taking lisinopril.   Edema- no  HYPERLIPIDEMIA Symptoms Chest pain on exertion:  No   Leg claudication:   No Medications: Compliance- taking simvastatin, not taking Zetia Right upper quadrant pain- no  Muscle aches- no  Fatty liver: Found on recent ultrasound. She's been getting hepatitis B and hepatitis A vaccinations. No Tylenol use. No alcohol use. Diet is heavy in vegetables and not much meat. Not much fried foods either.  Low back pain: Patient notes rarely this bothers her. It is a dull ache. She thinks she needs to get a new mattress to help with this. No numbness, weakness, loss of bowel or bladder function, saddle anesthesia, fevers, or radiation to her legs. She has not been exercising much recently and wants to get back to this.   PMH: nonsmoker.   ROS see history of present illness  Objective  Physical Exam Vitals:   07/23/16 0845  BP: 124/72  Pulse: 61  Temp: 97.4 F (36.3 C)    BP Readings from Last 3 Encounters:  07/23/16 124/72  02/10/16 122/70  01/07/16 130/60   Wt Readings from Last 3 Encounters:  07/23/16 170 lb 9.6 oz (77.4 kg)  02/10/16 174 lb 12.8 oz (79.3 kg)  01/07/16 175 lb (79.4 kg)    Physical Exam  Constitutional: She is well-developed, well-nourished, and in no distress.  Cardiovascular: Normal rate, regular rhythm and normal heart sounds.   Pulmonary/Chest: Effort normal and breath sounds normal.  Abdominal: Soft. Bowel sounds are normal. She exhibits no distension. There is no tenderness. There is no rebound and no guarding.  Musculoskeletal: She exhibits no edema.  No midline spine tenderness, no midline spine step-off, no muscular back tenderness  Neurological: She is alert. Gait normal.  5  out of 5 strength in bilateral quads, hamstrings, plantar flexion, and dorsiflexion, sensation to light touch intact in bilateral lower extremities  Skin: Skin is warm and dry.     Assessment/Plan: Please see individual problem list.  Essential hypertension At goal today. Continue current medications. Lab work as outlined below.  Fatty liver Discuss this with patient. She will continue hepatitis A and B vaccination series. Avoid Tylenol and if uses Tylenol she will limit it to less than 2000 mg a day. Avoid alcohol use. Continue to monitor.  Hypercholesterolemia Tolerating simvastatin. Given family history patient could benefit from a higher intensity statin or addition of Zetia. We will check her lipid panel today. She is seeing her cardiologist later today and I have asked her to discuss this with them as well.  Low back pain This is mild in nature. Asymptomatic currently. No red flags. Neurologically intact in her lower extremities. She'll continue to monitor. Given return precautions.   Orders Placed This Encounter  Procedures  . Lipid Profile  . Comp Met (CMET)  . CBC  . TSH    [name], MD [place]

## 2016-07-23 NOTE — Patient Instructions (Addendum)
Medication Instructions:   No medication changes made  Search for "pulse meters" on the app store Monitor heart rate   Call the office if you have irregular rhythm  Labwork:  No new labs needed  Testing/Procedures:  No further testing at this time   I recommend watching educational videos on topics of interest to you at:       [email or web address]  Enter code: HEARTCARE    Follow-Up: It was a pleasure seeing you in the office today. Please call us if you have new issues that need to be addressed before your next appt.  [phone]  Your physician wants you to follow-up in: 12 months.  You will receive a reminder letter in the mail two months in advance. If you don't receive a letter, please call our office to schedule the follow-up appointment.  If you need a refill on your cardiac medications before your next appointment, please call your pharmacy.

## 2016-09-01 ENCOUNTER — Telehealth: Payer: Self-pay | Admitting: Radiology

## 2016-09-01 ENCOUNTER — Telehealth: Payer: Self-pay

## 2016-09-01 DIAGNOSIS — E785 Hyperlipidemia, unspecified: Secondary | ICD-10-CM

## 2016-09-01 NOTE — Telephone Encounter (Signed)
PT coming in for labs tomorrow, please place orders. Thank you .  

## 2016-09-01 NOTE — Telephone Encounter (Signed)
Patient coming in for fasting labs on 09/02/16 from 07/23/16 can you place orders for lipid panel .  Thanks

## 2016-09-02 ENCOUNTER — Other Ambulatory Visit: Payer: PPO

## 2016-09-02 NOTE — Telephone Encounter (Signed)
Order placed

## 2016-09-03 ENCOUNTER — Ambulatory Visit: Payer: PPO

## 2016-09-07 ENCOUNTER — Other Ambulatory Visit: Payer: PPO

## 2016-09-08 ENCOUNTER — Ambulatory Visit (INDEPENDENT_AMBULATORY_CARE_PROVIDER_SITE_OTHER): Payer: Medicare PPO | Admitting: Family Medicine

## 2016-09-08 ENCOUNTER — Encounter: Payer: Self-pay | Admitting: Family Medicine

## 2016-09-08 VITALS — BP 126/78 | HR 60 | Temp 98.5°F | Resp 16 | Wt 173.1 lb

## 2016-09-08 DIAGNOSIS — G629 Polyneuropathy, unspecified: Secondary | ICD-10-CM | POA: Diagnosis not present

## 2016-09-08 DIAGNOSIS — E785 Hyperlipidemia, unspecified: Secondary | ICD-10-CM | POA: Diagnosis not present

## 2016-09-08 LAB — LDL CHOLESTEROL, DIRECT: Direct LDL: 143 mg/dL

## 2016-09-08 LAB — HEPATIC FUNCTION PANEL
ALBUMIN: 4.4 g/dL (ref 3.5–5.2)
ALK PHOS: 64 U/L (ref 39–117)
ALT: 25 U/L (ref 0–35)
AST: 39 U/L — ABNORMAL HIGH (ref 0–37)
BILIRUBIN TOTAL: 0.8 mg/dL (ref 0.2–1.2)
Bilirubin, Direct: 0.2 mg/dL (ref 0.0–0.3)
Total Protein: 7 g/dL (ref 6.0–8.3)

## 2016-09-08 LAB — VITAMIN B12: Vitamin B-12: 245 pg/mL (ref 211–911)

## 2016-09-08 LAB — VITAMIN D 25 HYDROXY (VIT D DEFICIENCY, FRACTURES): VITD: 30.68 ng/mL (ref 30.00–100.00)

## 2016-09-08 LAB — COMPREHENSIVE METABOLIC PANEL
ALBUMIN: 4.4 g/dL (ref 3.5–5.2)
ALK PHOS: 64 U/L (ref 39–117)
ALT: 25 U/L (ref 0–35)
AST: 39 U/L — AB (ref 0–37)
BILIRUBIN TOTAL: 0.8 mg/dL (ref 0.2–1.2)
BUN: 7 mg/dL (ref 6–23)
CALCIUM: 9.8 mg/dL (ref 8.4–10.5)
CO2: 30 meq/L (ref 19–32)
CREATININE: 0.74 mg/dL (ref 0.40–1.20)
Chloride: 106 mEq/L (ref 96–112)
GFR: 80.1 mL/min (ref >60.00)
Glucose, Bld: 82 mg/dL (ref 70–99)
Potassium: 4.4 mEq/L (ref 3.5–5.1)
Sodium: 140 mEq/L (ref 135–145)
Total Protein: 7 g/dL (ref 6.0–8.3)

## 2016-09-08 LAB — HEMOGLOBIN A1C: HEMOGLOBIN A1C: 5.1 % (ref 4.6–6.5)

## 2016-09-08 NOTE — Assessment & Plan Note (Signed)
Exam and history consistent with neuropathy in bilateral plantar surfaces of the feet. Unknown cause at this time. We'll obtain lab work as outlined below to evaluate for cause. Discussed that we could consider medication or referral to neurology for further evaluation of this once her lab work comes back. She noted she would likely just monitor it as it does not bother her significantly.

## 2016-09-08 NOTE — Progress Notes (Signed)
Pre visit review using our clinic review tool, if applicable. No additional management support is needed unless otherwise documented below in the visit note. 

## 2016-09-08 NOTE — Progress Notes (Signed)
  [name], MD Phone: [phone]  Jill Snow is a 83 y.o. female who presents today for same-day visit.  Patient notes continued issues with neuropathy symptoms in her bilateral plantar surfaces of her feet. Occasionally they feel numb and tingly. Does not occur all the time. No burning sensation. No numbness elsewhere. No weakness. Her mom did have diabetes and did have neuropathy. She had a prior A1c that was in the normal range. Symptoms have been stable.  ROS see history of present illness  Objective  Physical Exam Vitals:   09/08/16 1117  BP: 126/78  Pulse: 60  Resp: 16  Temp: 98.5 F (36.9 C)    BP Readings from Last 3 Encounters:  09/08/16 126/78  07/23/16 134/68  07/23/16 124/72   Wt Readings from Last 3 Encounters:  09/08/16 173 lb 2 oz (78.5 kg)  07/23/16 171 lb 4 oz (77.7 kg)  07/23/16 170 lb 9.6 oz (77.4 kg)    Physical Exam  Constitutional: No distress.  Cardiovascular: Normal rate, regular rhythm and normal heart sounds.   Pulmonary/Chest: Effort normal and breath sounds normal.  Neurological: She is alert. Gait normal.  5 out of 5 strength bilateral quads, hamstrings, plantar flexion, and dorsiflexion, sensation to light touch intact bilaterally lower extremities, decreased monofilament sensation in bilateral feet left slightly greater than right, 2+ DP pulses bilaterally, no skin changes to the feet  Skin: Skin is warm and dry. She is not diaphoretic.     Assessment/Plan: Please see individual problem list.  Neuropathy ([place]) Exam and history consistent with neuropathy in bilateral plantar surfaces of the feet. Unknown cause at this time. We'll obtain lab work as outlined below to evaluate for cause. Discussed that we could consider medication or referral to neurology for further evaluation of this once her lab work comes back. She noted she would likely just monitor it as it does not bother her significantly.   Orders Placed This  Encounter  Procedures  . B12    Standing Status:   Future    Number of Occurrences:   1    Standing Expiration Date:   09/08/2017  . Comp Met (CMET)    Standing Status:   Future    Number of Occurrences:   1    Standing Expiration Date:   09/08/2017  . Vitamin D (25 hydroxy)    Standing Status:   Future    Number of Occurrences:   1    Standing Expiration Date:   09/08/2017  . HgB A1c    Standing Status:   Future    Number of Occurrences:   1    Standing Expiration Date:   09/08/2017    [name], MD [place]

## 2016-09-08 NOTE — Patient Instructions (Signed)
Nice to see you. We are going to obtain some lab work and contact you with the results. We will determine the next step in management following this.

## 2016-09-10 ENCOUNTER — Telehealth: Payer: Self-pay | Admitting: Family Medicine

## 2016-09-10 NOTE — Telephone Encounter (Signed)
See other message

## 2016-09-10 NOTE — Telephone Encounter (Signed)
Pt called back returning your call. Thank you!  Call pt @ [phone]

## 2016-09-11 ENCOUNTER — Other Ambulatory Visit: Payer: PPO

## 2016-09-25 ENCOUNTER — Telehealth: Payer: Self-pay | Admitting: Cardiovascular Disease

## 2016-09-25 NOTE — Telephone Encounter (Signed)
PA sent through [email or web address] for pt's simvastatin. Outcome is Pending.

## 2016-09-28 NOTE — Telephone Encounter (Signed)
Simvastatin 40 mg approved 09/26/16-09/26/17.

## 2016-10-01 ENCOUNTER — Other Ambulatory Visit: Payer: Self-pay | Admitting: Internal Medicine

## 2016-10-01 ENCOUNTER — Ambulatory Visit (INDEPENDENT_AMBULATORY_CARE_PROVIDER_SITE_OTHER): Payer: Medicare PPO | Admitting: Family Medicine

## 2016-10-01 ENCOUNTER — Encounter: Payer: Self-pay | Admitting: Family Medicine

## 2016-10-01 DIAGNOSIS — B9789 Other viral agents as the cause of diseases classified elsewhere: Secondary | ICD-10-CM | POA: Diagnosis not present

## 2016-10-01 DIAGNOSIS — J069 Acute upper respiratory infection, unspecified: Secondary | ICD-10-CM

## 2016-10-01 DIAGNOSIS — J309 Allergic rhinitis, unspecified: Secondary | ICD-10-CM

## 2016-10-01 MED ORDER — PREDNISONE 10 MG PO TABS: ORAL_TABLET | ORAL | 0 refills | Status: DC

## 2016-10-01 MED ORDER — BENZONATATE 200 MG PO CAPS: 200.0000 mg | ORAL_CAPSULE | Freq: Two times a day (BID) | ORAL | 0 refills | Status: DC | PRN
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
## 2016-10-01 NOTE — Progress Notes (Signed)
Pre visit review using our clinic review tool, if applicable. No additional management support is needed unless otherwise documented below in the visit note. 

## 2016-10-22 DIAGNOSIS — G4733 Obstructive sleep apnea (adult) (pediatric): Secondary | ICD-10-CM | POA: Diagnosis not present

## 2016-10-27 ENCOUNTER — Encounter: Payer: Self-pay | Admitting: Emergency Medicine

## 2016-10-27 ENCOUNTER — Emergency Department
Admission: EM | Admit: 2016-10-27 | Discharge: 2016-10-27 | Disposition: A | Discharge status: Home / Self Care | Payer: Medicare PPO | Attending: Emergency Medicine | Admitting: Emergency Medicine

## 2016-10-27 DIAGNOSIS — Y92410 Unspecified street and highway as the place of occurrence of the external cause: Secondary | ICD-10-CM | POA: Diagnosis not present

## 2016-10-27 DIAGNOSIS — I10 Essential (primary) hypertension: Secondary | ICD-10-CM | POA: Diagnosis not present

## 2016-10-27 DIAGNOSIS — Y999 Unspecified external cause status: Secondary | ICD-10-CM | POA: Insufficient documentation

## 2016-10-27 DIAGNOSIS — Y9389 Activity, other specified: Secondary | ICD-10-CM | POA: Diagnosis not present

## 2016-10-27 DIAGNOSIS — S199XXA Unspecified injury of neck, initial encounter: Secondary | ICD-10-CM | POA: Diagnosis present

## 2016-10-27 DIAGNOSIS — S161XXA Strain of muscle, fascia and tendon at neck level, initial encounter: Secondary | ICD-10-CM | POA: Diagnosis not present

## 2016-10-27 DIAGNOSIS — Z79899 Other long term (current) drug therapy: Secondary | ICD-10-CM | POA: Insufficient documentation
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
## 2016-10-29 ENCOUNTER — Ambulatory Visit: Payer: Medicare PPO

## 2016-11-30 ENCOUNTER — Inpatient Hospital Stay (HOSPITAL_COMMUNITY)
Admission: EM | Admit: 2016-11-30 | Discharge: 2016-12-03 | DRG: 872 | Disposition: A | Discharge status: Home / Self Care | Payer: No Typology Code available for payment source | Attending: Internal Medicine | Admitting: Internal Medicine

## 2016-11-30 ENCOUNTER — Emergency Department (HOSPITAL_COMMUNITY): Payer: No Typology Code available for payment source

## 2016-11-30 ENCOUNTER — Encounter (HOSPITAL_COMMUNITY): Payer: Self-pay | Admitting: *Deleted

## 2016-11-30 DIAGNOSIS — S3991XA Unspecified injury of abdomen, initial encounter: Secondary | ICD-10-CM | POA: Diagnosis not present

## 2016-11-30 DIAGNOSIS — M48061 Spinal stenosis, lumbar region without neurogenic claudication: Secondary | ICD-10-CM | POA: Diagnosis present

## 2016-11-30 DIAGNOSIS — N390 Urinary tract infection, site not specified: Secondary | ICD-10-CM | POA: Diagnosis not present

## 2016-11-30 DIAGNOSIS — M546 Pain in thoracic spine: Secondary | ICD-10-CM | POA: Diagnosis not present

## 2016-11-30 DIAGNOSIS — Z833 Family history of diabetes mellitus: Secondary | ICD-10-CM

## 2016-11-30 DIAGNOSIS — N1330 Unspecified hydronephrosis: Secondary | ICD-10-CM | POA: Diagnosis not present

## 2016-11-30 DIAGNOSIS — I480 Paroxysmal atrial fibrillation: Secondary | ICD-10-CM | POA: Diagnosis present

## 2016-11-30 DIAGNOSIS — A4151 Sepsis due to Escherichia coli [E. coli]: Secondary | ICD-10-CM | POA: Diagnosis not present

## 2016-11-30 DIAGNOSIS — I10 Essential (primary) hypertension: Secondary | ICD-10-CM | POA: Diagnosis present

## 2016-11-30 DIAGNOSIS — A419 Sepsis, unspecified organism: Secondary | ICD-10-CM | POA: Diagnosis present

## 2016-11-30 DIAGNOSIS — E871 Hypo-osmolality and hyponatremia: Secondary | ICD-10-CM

## 2016-11-30 DIAGNOSIS — E876 Hypokalemia: Secondary | ICD-10-CM

## 2016-11-30 DIAGNOSIS — Z79899 Other long term (current) drug therapy: Secondary | ICD-10-CM

## 2016-11-30 DIAGNOSIS — E7800 Pure hypercholesterolemia, unspecified: Secondary | ICD-10-CM | POA: Diagnosis present

## 2016-11-30 DIAGNOSIS — Z8249 Family history of ischemic heart disease and other diseases of the circulatory system: Secondary | ICD-10-CM | POA: Diagnosis not present

## 2016-11-30 DIAGNOSIS — Z823 Family history of stroke: Secondary | ICD-10-CM | POA: Diagnosis not present

## 2016-11-30 DIAGNOSIS — G8929 Other chronic pain: Secondary | ICD-10-CM | POA: Diagnosis not present

## 2016-11-30 DIAGNOSIS — Z8261 Family history of arthritis: Secondary | ICD-10-CM

## 2016-11-30 DIAGNOSIS — R6520 Severe sepsis without septic shock: Secondary | ICD-10-CM | POA: Diagnosis not present

## 2016-11-30 DIAGNOSIS — M545 Low back pain, unspecified: Secondary | ICD-10-CM | POA: Diagnosis present

## 2016-11-30 DIAGNOSIS — Z9989 Dependence on other enabling machines and devices: Secondary | ICD-10-CM

## 2016-11-30 DIAGNOSIS — E872 Acidosis, unspecified: Secondary | ICD-10-CM

## 2016-11-30 DIAGNOSIS — Z881 Allergy status to other antibiotic agents status: Secondary | ICD-10-CM

## 2016-11-30 DIAGNOSIS — S79912A Unspecified injury of left hip, initial encounter: Secondary | ICD-10-CM | POA: Diagnosis not present

## 2016-11-30 DIAGNOSIS — N179 Acute kidney failure, unspecified: Secondary | ICD-10-CM

## 2016-11-30 DIAGNOSIS — S299XXA Unspecified injury of thorax, initial encounter: Secondary | ICD-10-CM | POA: Diagnosis not present

## 2016-11-30 DIAGNOSIS — M542 Cervicalgia: Secondary | ICD-10-CM | POA: Diagnosis not present

## 2016-11-30 DIAGNOSIS — E860 Dehydration: Secondary | ICD-10-CM | POA: Diagnosis present

## 2016-11-30 DIAGNOSIS — M419 Scoliosis, unspecified: Secondary | ICD-10-CM | POA: Diagnosis present

## 2016-11-30 DIAGNOSIS — G4733 Obstructive sleep apnea (adult) (pediatric): Secondary | ICD-10-CM | POA: Diagnosis not present

## 2016-11-30 DIAGNOSIS — R918 Other nonspecific abnormal finding of lung field: Secondary | ICD-10-CM | POA: Diagnosis not present

## 2016-11-30 DIAGNOSIS — J189 Pneumonia, unspecified organism: Secondary | ICD-10-CM | POA: Diagnosis not present

## 2016-11-30 DIAGNOSIS — D649 Anemia, unspecified: Secondary | ICD-10-CM | POA: Diagnosis present

## 2016-11-30 DIAGNOSIS — M25552 Pain in left hip: Secondary | ICD-10-CM | POA: Diagnosis not present

## 2016-11-30 LAB — COMPREHENSIVE METABOLIC PANEL
ALBUMIN: 3.6 g/dL (ref 3.5–5.0)
ALK PHOS: 71 U/L (ref 38–126)
ALT: 21 U/L (ref 14–54)
AST: 27 U/L (ref 15–41)
Anion gap: 13 (ref 5–15)
BILIRUBIN TOTAL: 2.2 mg/dL — AB (ref 0.3–1.2)
BUN: 24 mg/dL — ABNORMAL HIGH (ref 6–20)
CALCIUM: 9.1 mg/dL (ref 8.9–10.3)
CO2: 18 mmol/L — ABNORMAL LOW (ref 22–32)
Chloride: 98 mmol/L — ABNORMAL LOW (ref 101–111)
Creatinine, Ser: 1.54 mg/dL — ABNORMAL HIGH (ref 0.44–1.00)
GFR calc Af Amer: 35 mL/min — ABNORMAL LOW (ref >60)
GFR calc non Af Amer: 30 mL/min — ABNORMAL LOW (ref >60)
GLUCOSE: 337 mg/dL — AB (ref 65–99)
Potassium: 3.3 mmol/L — ABNORMAL LOW (ref 3.5–5.1)
Sodium: 129 mmol/L — ABNORMAL LOW (ref 135–145)
TOTAL PROTEIN: 7.1 g/dL (ref 6.5–8.1)

## 2016-11-30 LAB — I-STAT CG4 LACTIC ACID, ED
Lactic Acid, Venous: 4.49 mmol/L (ref 0.5–1.9)
Lactic Acid, Venous: 3.23 mmol/L (ref 0.5–1.9)

## 2016-11-30 LAB — CBC WITH DIFFERENTIAL/PLATELET
BASOS ABS: 0 10*3/uL (ref 0.0–0.1)
BASOS PCT: 0 %
EOS PCT: 0 %
Eosinophils Absolute: 0 10*3/uL (ref 0.0–0.7)
HCT: 43.1 % (ref 36.0–46.0)
Hemoglobin: 14.9 g/dL (ref 12.0–15.0)
Lymphocytes Relative: 2 %
Lymphs Abs: 0.2 10*3/uL — ABNORMAL LOW (ref 0.7–4.0)
MCH: 32.4 pg (ref 26.0–34.0)
MCHC: 34.6 g/dL (ref 30.0–36.0)
MCV: 93.7 fL (ref 78.0–100.0)
MONO ABS: 0.6 10*3/uL (ref 0.1–1.0)
Monocytes Relative: 5 %
Neutro Abs: 11.8 10*3/uL — ABNORMAL HIGH (ref 1.7–7.7)
Neutrophils Relative %: 93 %
PLATELETS: 173 10*3/uL (ref 150–400)
RBC: 4.6 MIL/uL (ref 3.87–5.11)
RDW: 12.5 % (ref 11.5–15.5)
WBC: 12.7 10*3/uL — ABNORMAL HIGH (ref 4.0–10.5)

## 2016-11-30 LAB — URINALYSIS, ROUTINE W REFLEX MICROSCOPIC
BILIRUBIN URINE: NEGATIVE
GLUCOSE, UA: 50 mg/dL — AB
KETONES UR: NEGATIVE mg/dL
Nitrite: NEGATIVE
PH: 5 (ref 5.0–8.0)
Protein, ur: 30 mg/dL — AB
Specific Gravity, Urine: 1.009 (ref 1.005–1.030)

## 2016-11-30 LAB — MAGNESIUM: MAGNESIUM: 1.7 mg/dL (ref 1.7–2.4)

## 2016-11-30 LAB — LACTIC ACID, PLASMA
Lactic Acid, Venous: 2 mmol/L (ref 0.5–1.9)
Lactic Acid, Venous: 1 mmol/L (ref 0.5–1.9)

## 2016-11-30 MED ORDER — SODIUM CHLORIDE 0.9 % IV BOLUS (SEPSIS)
30.0000 mL/kg | Freq: Once | INTRAVENOUS | Status: AC
  Administered 2016-11-30: 2040 mL via INTRAVENOUS

## 2016-11-30 MED ORDER — DEXTROSE 5 % IV SOLN
1.0000 g | INTRAVENOUS | Status: DC
  Administered 2016-12-01 (×2): 1 g via INTRAVENOUS
  Filled 2016-11-30 (×3): qty 10

## 2016-11-30 MED ORDER — ACETAMINOPHEN 325 MG PO TABS
650.0000 mg | ORAL_TABLET | Freq: Once | ORAL | Status: AC
  Administered 2016-11-30: 650 mg via ORAL

## 2016-11-30 MED ORDER — METHOCARBAMOL 1000 MG/10ML IJ SOLN
500.0000 mg | Freq: Four times a day (QID) | INTRAVENOUS | Status: DC | PRN
  Administered 2016-12-01: 500 mg via INTRAVENOUS
  Filled 2016-11-30 (×3): qty 5

## 2016-11-30 MED ORDER — SODIUM CHLORIDE 0.9 % IV SOLN
INTRAVENOUS | Status: DC
  Administered 2016-12-01 – 2016-12-02 (×4): via INTRAVENOUS

## 2016-11-30 MED ORDER — ACETAMINOPHEN 325 MG PO TABS
650.0000 mg | ORAL_TABLET | Freq: Four times a day (QID) | ORAL | Status: DC | PRN
  Administered 2016-11-30 – 2016-12-01 (×3): 650 mg via ORAL
  Filled 2016-11-30 (×3): qty 2

## 2016-11-30 MED ORDER — ACETAMINOPHEN 325 MG PO TABS
ORAL_TABLET | ORAL | Status: AC
  Filled 2016-11-30: qty 2

## 2016-11-30 MED ORDER — LEVOFLOXACIN IN D5W 750 MG/150ML IV SOLN
750.0000 mg | Freq: Once | INTRAVENOUS | Status: AC
  Administered 2016-11-30: 750 mg via INTRAVENOUS
  Filled 2016-11-30: qty 150

## 2016-11-30 MED ORDER — MONTELUKAST SODIUM 10 MG PO TABS
10.0000 mg | ORAL_TABLET | Freq: Every day | ORAL | Status: DC
  Administered 2016-12-01 – 2016-12-02 (×2): 10 mg via ORAL
  Filled 2016-11-30 (×3): qty 1

## 2016-11-30 MED ORDER — POTASSIUM CHLORIDE CRYS ER 20 MEQ PO TBCR
40.0000 meq | EXTENDED_RELEASE_TABLET | Freq: Once | ORAL | Status: AC
Start: 2016-11-30 — End: 2016-11-30
  Administered 2016-11-30: 40 meq via ORAL
  Filled 2016-11-30: qty 2

## 2016-11-30 MED ORDER — ONDANSETRON HCL 4 MG/2ML IJ SOLN
4.0000 mg | Freq: Four times a day (QID) | INTRAMUSCULAR | Status: DC | PRN
  Administered 2016-11-30 – 2016-12-03 (×3): 4 mg via INTRAVENOUS
  Filled 2016-11-30 (×4): qty 2

## 2016-11-30 MED ORDER — SIMVASTATIN 40 MG PO TABS
40.0000 mg | ORAL_TABLET | Freq: Every day | ORAL | Status: DC
  Administered 2016-12-01 – 2016-12-02 (×2): 40 mg via ORAL
  Filled 2016-11-30 (×2): qty 1

## 2016-11-30 MED ORDER — ENOXAPARIN SODIUM 30 MG/0.3ML ~~LOC~~ SOLN
30.0000 mg | SUBCUTANEOUS | Status: DC
  Administered 2016-12-01: 30 mg via SUBCUTANEOUS
  Filled 2016-11-30 (×2): qty 0.3

## 2016-11-30 MED ORDER — AMIODARONE HCL 200 MG PO TABS
200.0000 mg | ORAL_TABLET | Freq: Every day | ORAL | Status: DC
  Administered 2016-12-01 – 2016-12-03 (×3): 200 mg via ORAL
  Filled 2016-11-30 (×4): qty 1

## 2016-11-30 MED ORDER — ONDANSETRON HCL 4 MG PO TABS
4.0000 mg | ORAL_TABLET | Freq: Four times a day (QID) | ORAL | Status: DC | PRN
  Filled 2016-11-30: qty 1
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
## 2016-11-30 NOTE — ED Notes (Signed)
Pt given orange juice and coke. Pt tolerating fluids well. Pt states she feels much better after drinking some fluids. Pt's dinner tray at bedside, pt states she has no appetite and does not want to eat. Will continue to monitor. Family at bedside.
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
## 2016-11-30 NOTE — ED Notes (Signed)
2.0 Lactic acid per Lab.

## 2016-11-30 NOTE — ED Notes (Signed)
This RN heard the pt yelling "help" this RN went in and found that the pt had vomited on herself. This RN helped the pt changed into a clean gown and gave the pt clean blankets. This RN hooked the pt back up to the monitor. Caitlynn RN made aware.
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
## 2016-11-30 NOTE — ED Notes (Signed)
Pt given warm blanket.

## 2016-12-01 ENCOUNTER — Ambulatory Visit: Payer: Medicare PPO | Admitting: Family Medicine

## 2016-12-01 ENCOUNTER — Encounter (HOSPITAL_COMMUNITY): Payer: Self-pay

## 2016-12-01 LAB — CBC
HCT: 35.1 % — ABNORMAL LOW (ref 36.0–46.0)
HEMOGLOBIN: 11.7 g/dL — AB (ref 12.0–15.0)
MCH: 31.8 pg (ref 26.0–34.0)
MCHC: 33.9 g/dL (ref 30.0–36.0)
MCV: 93.9 fL (ref 78.0–100.0)
PLATELETS: 130 10*3/uL — AB (ref 150–400)
RBC: 3.74 MIL/uL — AB (ref 3.87–5.11)
RDW: 12.8 % (ref 11.5–15.5)
WBC: 7.2 10*3/uL (ref 4.0–10.5)

## 2016-12-01 LAB — BASIC METABOLIC PANEL
ANION GAP: 8 (ref 5–15)
BUN: 15 mg/dL (ref 6–20)
CALCIUM: 8.4 mg/dL — AB (ref 8.9–10.3)
CO2: 21 mmol/L — AB (ref 22–32)
CREATININE: 1.04 mg/dL — AB (ref 0.44–1.00)
Chloride: 107 mmol/L (ref 101–111)
GFR, EST AFRICAN AMERICAN: 57 mL/min — AB (ref >60)
GFR, EST NON AFRICAN AMERICAN: 49 mL/min — AB (ref >60)
GLUCOSE: 104 mg/dL — AB (ref 65–99)
Potassium: 3.6 mmol/L (ref 3.5–5.1)
Sodium: 136 mmol/L (ref 135–145)

## 2016-12-01 LAB — GLUCOSE, CAPILLARY
GLUCOSE-CAPILLARY: 84 mg/dL (ref 65–99)
GLUCOSE-CAPILLARY: 103 mg/dL — AB (ref 65–99)
Glucose-Capillary: 74 mg/dL (ref 65–99)
Glucose-Capillary: 92 mg/dL (ref 65–99)

## 2016-12-01 MED ORDER — ACETAMINOPHEN 325 MG PO TABS
650.0000 mg | ORAL_TABLET | Freq: Four times a day (QID) | ORAL | Status: DC
  Administered 2016-12-01 – 2016-12-03 (×9): 650 mg via ORAL
  Filled 2016-12-01 (×9): qty 2

## 2016-12-01 MED ORDER — DEXTROSE 5 % IV SOLN
500.0000 mg | Freq: Three times a day (TID) | INTRAVENOUS | Status: DC
  Administered 2016-12-01 – 2016-12-03 (×7): 500 mg via INTRAVENOUS
  Filled 2016-12-01 (×9): qty 5

## 2016-12-01 MED ORDER — ENOXAPARIN SODIUM 40 MG/0.4ML ~~LOC~~ SOLN
40.0000 mg | SUBCUTANEOUS | Status: DC
  Administered 2016-12-02 – 2016-12-03 (×2): 40 mg via SUBCUTANEOUS
  Filled 2016-12-01 (×3): qty 0.4

## 2016-12-01 MED ORDER — ACETAMINOPHEN 325 MG PO TABS
325.0000 mg | ORAL_TABLET | Freq: Once | ORAL | Status: AC
  Administered 2016-12-01: 325 mg via ORAL
  Filled 2016-12-01: qty 1

## 2016-12-01 MED ORDER — FENTANYL CITRATE (PF) 100 MCG/2ML IJ SOLN
12.5000 ug | INTRAMUSCULAR | Status: DC | PRN
  Administered 2016-12-01 – 2016-12-02 (×4): 12.5 ug via INTRAVENOUS
  Filled 2016-12-01 (×4): qty 2

## 2016-12-01 MED ORDER — SODIUM CHLORIDE 0.9 % IV BOLUS (SEPSIS)
500.0000 mL | Freq: Once | INTRAVENOUS | Status: AC
  Administered 2016-12-01: 500 mL via INTRAVENOUS
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
## 2016-12-01 NOTE — Progress Notes (Signed)
Patient refused CPAP for the night  
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
## 2016-12-01 NOTE — Progress Notes (Signed)
Pt admitted from ED per stretcher accompanied by nurse tech and pt son, pt was fully alert on arrival to the floor, self introduced to pt and family ID bracelet checked fall risk assessment and fall prevention plan discussed with pt, skin assessment done with another nurse, pt temp was running high after PO  tylenol was given,  Cold comprss applied to her groins and under arm,[place] was paged as well as rapid response all came to the floor to review the pt gave her another dose of tylenol per on call order, temp  Has dropped down within normal range, bed alarm on, phone and call light with reach will continue to monitor pt

## 2016-12-01 NOTE — Progress Notes (Signed)
Patient set up with cpap machine, as soon as placed on she stated she felt like she couldn't breathe, mask wasn't as comfortable as her at home. cpap removed; remains at bedside, encouraged patient to call if she changed her mind and wanted to try again, she communicates understanding.

## 2016-12-02 LAB — URINE CULTURE: Culture: >=100000 — AB

## 2016-12-02 LAB — GLUCOSE, CAPILLARY
GLUCOSE-CAPILLARY: 95 mg/dL (ref 65–99)
GLUCOSE-CAPILLARY: 159 mg/dL — AB (ref 65–99)
Glucose-Capillary: 89 mg/dL (ref 65–99)

## 2016-12-02 MED ORDER — CEPHALEXIN 500 MG PO CAPS
500.0000 mg | ORAL_CAPSULE | Freq: Two times a day (BID) | ORAL | Status: DC
  Administered 2016-12-02 – 2016-12-03 (×2): 500 mg via ORAL
  Filled 2016-12-02 (×2): qty 1
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
## 2016-12-03 DIAGNOSIS — M545 Low back pain: Secondary | ICD-10-CM

## 2016-12-03 DIAGNOSIS — G8929 Other chronic pain: Secondary | ICD-10-CM

## 2016-12-03 LAB — GLUCOSE, CAPILLARY
GLUCOSE-CAPILLARY: 95 mg/dL (ref 65–99)
Glucose-Capillary: 120 mg/dL — ABNORMAL HIGH (ref 65–99)
Glucose-Capillary: 95 mg/dL (ref 65–99)

## 2016-12-03 LAB — CBC
HEMATOCRIT: 32 % — AB (ref 36.0–46.0)
Hemoglobin: 10.9 g/dL — ABNORMAL LOW (ref 12.0–15.0)
MCH: 31.3 pg (ref 26.0–34.0)
MCHC: 34.1 g/dL (ref 30.0–36.0)
MCV: 92 fL (ref 78.0–100.0)
PLATELETS: 138 10*3/uL — AB (ref 150–400)
RBC: 3.48 MIL/uL — ABNORMAL LOW (ref 3.87–5.11)
RDW: 12.8 % (ref 11.5–15.5)
WBC: 4.5 10*3/uL (ref 4.0–10.5)

## 2016-12-03 LAB — POTASSIUM: POTASSIUM: 3.3 mmol/L — AB (ref 3.5–5.1)

## 2016-12-03 LAB — BASIC METABOLIC PANEL
Anion gap: 7 (ref 5–15)
BUN: 8 mg/dL (ref 6–20)
CALCIUM: 8.6 mg/dL — AB (ref 8.9–10.3)
CHLORIDE: 109 mmol/L (ref 101–111)
CO2: 21 mmol/L — ABNORMAL LOW (ref 22–32)
CREATININE: 0.83 mg/dL (ref 0.44–1.00)
GFR calc Af Amer: >60 mL/min (ref >60)
GLUCOSE: 94 mg/dL (ref 65–99)
POTASSIUM: 2.8 mmol/L — AB (ref 3.5–5.1)
SODIUM: 137 mmol/L (ref 135–145)

## 2016-12-03 MED ORDER — POTASSIUM CHLORIDE CRYS ER 20 MEQ PO TBCR
40.0000 meq | EXTENDED_RELEASE_TABLET | ORAL | Status: AC
  Administered 2016-12-03 (×2): 40 meq via ORAL
  Filled 2016-12-03 (×2): qty 2

## 2016-12-03 MED ORDER — WHITE PETROLATUM GEL
Status: AC
  Administered 2016-12-03: 16:00:00
  Filled 2016-12-03: qty 1

## 2016-12-03 MED ORDER — METHOCARBAMOL 500 MG PO TABS: 500.0000 mg | ORAL_TABLET | Freq: Three times a day (TID) | ORAL | 0 refills | Status: DC | PRN

## 2016-12-03 MED ORDER — CEPHALEXIN 500 MG PO CAPS: 500.0000 mg | ORAL_CAPSULE | Freq: Two times a day (BID) | ORAL | 0 refills | Status: DC

## 2016-12-03 MED ORDER — MAGNESIUM SULFATE 2 GM/50ML IV SOLN
2.0000 g | Freq: Once | INTRAVENOUS | Status: AC
  Administered 2016-12-03: 2 g via INTRAVENOUS
  Filled 2016-12-03: qty 50

## 2016-12-03 MED ORDER — POTASSIUM CHLORIDE CRYS ER 20 MEQ PO TBCR
40.0000 meq | EXTENDED_RELEASE_TABLET | Freq: Once | ORAL | Status: AC
  Administered 2016-12-03: 40 meq via ORAL
  Filled 2016-12-03: qty 2

## 2016-12-03 MED ORDER — ACETAMINOPHEN 325 MG PO TABS: 650.0000 mg | ORAL_TABLET | Freq: Four times a day (QID) | ORAL | Status: DC | PRN

## 2016-12-03 NOTE — Care Management Note (Signed)
Case Management Note  Patient Details  Name: Jill Snow MRN: [id number] Date of Birth: May 19, 1934  Subjective/Objective:                    Action/Plan: Plan is for patient to d/c home with self care. CM consulted for outpatient therapy. CM met with the patient and she was interested in attending in [place]. CM placed order in EPIC and information on the AVS.   Expected Discharge Date:                  Expected Discharge Plan:  Home/Self Care  In-House Referral:     Discharge planning Services  CM Consult  Post Acute Care Choice:    Choice offered to:     DME Arranged:    DME Agency:     HH Arranged:    [place] Agency:     Status of Service:  Completed, signed off  If discussed at [employer] of Stay Meetings, dates discussed:    Additional Comments:  [name], RN 12/03/2016, 12:31 PM
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
## 2016-12-03 NOTE — Progress Notes (Signed)
Pt ready for discharge to home. Pt. Is alert and oriented. Pt is hemodynamically stable. AVS reviewed with pt. Capable of re verbalizing medication regimen. Discharge plan appropriate and in place. IV removed.

## 2016-12-03 NOTE — Progress Notes (Signed)
Pt. refused CPAP use this evening, remains in no distress.

## 2016-12-05 LAB — CULTURE, BLOOD (ROUTINE X 2)
CULTURE: NO GROWTH
Culture: NO GROWTH
SPECIAL REQUESTS: ADEQUATE
SPECIAL REQUESTS: ADEQUATE

## 2016-12-08 ENCOUNTER — Telehealth: Payer: Self-pay

## 2016-12-08 NOTE — Telephone Encounter (Signed)
lmov to schedule carotid 1 yr fu

## 2016-12-10 ENCOUNTER — Encounter: Payer: Self-pay | Admitting: Family Medicine

## 2016-12-10 ENCOUNTER — Ambulatory Visit (INDEPENDENT_AMBULATORY_CARE_PROVIDER_SITE_OTHER): Payer: Medicare PPO | Admitting: Family Medicine

## 2016-12-10 VITALS — BP 130/70 | HR 66 | Temp 98.2°F | Wt 164.0 lb

## 2016-12-10 DIAGNOSIS — D649 Anemia, unspecified: Secondary | ICD-10-CM | POA: Diagnosis not present

## 2016-12-10 DIAGNOSIS — R911 Solitary pulmonary nodule: Secondary | ICD-10-CM

## 2016-12-10 DIAGNOSIS — K760 Fatty (change of) liver, not elsewhere classified: Secondary | ICD-10-CM

## 2016-12-10 DIAGNOSIS — G8929 Other chronic pain: Secondary | ICD-10-CM

## 2016-12-10 DIAGNOSIS — M545 Low back pain: Secondary | ICD-10-CM | POA: Diagnosis not present

## 2016-12-10 DIAGNOSIS — N179 Acute kidney failure, unspecified: Secondary | ICD-10-CM

## 2016-12-10 DIAGNOSIS — N1330 Unspecified hydronephrosis: Secondary | ICD-10-CM | POA: Diagnosis not present

## 2016-12-10 DIAGNOSIS — A4151 Sepsis due to Escherichia coli [E. coli]: Secondary | ICD-10-CM | POA: Diagnosis not present

## 2016-12-10 LAB — BASIC METABOLIC PANEL
BUN: 8 mg/dL (ref 6–23)
CHLORIDE: 104 meq/L (ref 96–112)
CO2: 29 mEq/L (ref 19–32)
Calcium: 9.7 mg/dL (ref 8.4–10.5)
Creatinine, Ser: 0.8 mg/dL (ref 0.40–1.20)
GFR: 73.16 mL/min (ref >60.00)
GLUCOSE: 90 mg/dL (ref 70–99)
POTASSIUM: 3.8 meq/L (ref 3.5–5.1)
SODIUM: 140 meq/L (ref 135–145)

## 2016-12-10 LAB — CBC
HEMATOCRIT: 41.1 % (ref 36.0–46.0)
HEMOGLOBIN: 13.6 g/dL (ref 12.0–15.0)
MCHC: 33.1 g/dL (ref 30.0–36.0)
MCV: 95.9 fl (ref 78.0–100.0)
PLATELETS: 407 10*3/uL — AB (ref 150.0–400.0)
RBC: 4.29 Mil/uL (ref 3.87–5.11)
RDW: 13.3 % (ref 11.5–15.5)
WBC: 6.1 10*3/uL (ref 4.0–10.5)

## 2016-12-10 NOTE — Assessment & Plan Note (Signed)
Noted on CT chest in hospital. Recommended 6-12 month follow-up. She reports no smoking history. CT chest ordered to be scheduled in October of this year.

## 2016-12-10 NOTE — Assessment & Plan Note (Signed)
She has had resolution of her low back pain. They recommended physical therapy from the hospital though the patient wants to defer this currently. She'll monitor for any recurrence.

## 2016-12-10 NOTE — Assessment & Plan Note (Signed)
Patient had AKI on her lab work from the hospital. We will plan on rechecking a BMP today.

## 2016-12-10 NOTE — Patient Instructions (Signed)
Nice to see you. I am glad you are doing better. You should finish the course of Keflex. We will get you to see urology. We'll check lab work today and contact you with the results. We'll plan on doing a CT scan of your chest to follow-up on the nodule in October. If you develop abdominal pain, fevers, or any new change in symptoms please seek medical attention medially.
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
## 2016-12-10 NOTE — Assessment & Plan Note (Signed)
Mildly anemic when in the hospital. Suspect related to volume repletion. Discussed rechecking today.

## 2016-12-10 NOTE — Assessment & Plan Note (Signed)
We will refer to GI for follow-up of this issue.

## 2016-12-10 NOTE — Progress Notes (Signed)
Pre visit review using our clinic review tool, if applicable. No additional management support is needed unless otherwise documented below in the visit note. 

## 2016-12-10 NOTE — Assessment & Plan Note (Signed)
Noted on CT scan abdomen and pelvis when she is in the hospital. Potentially could've been related to her acute illness versus having previously passed a stone. Given this finding we will refer to urology to see if any further workup needs to be completed.

## 2016-12-10 NOTE — Assessment & Plan Note (Addendum)
Related to Escherichia coli UTI. Patient has done fairly well after discharge from the hospital. She notes no urinary symptoms. She has benign abdominal exam. She will complete her course of Keflex. Discussed that if she has any new symptoms she should be reevaluated.

## 2016-12-11 ENCOUNTER — Ambulatory Visit: Payer: Self-pay | Admitting: Urology

## 2016-12-11 ENCOUNTER — Encounter: Payer: Self-pay | Admitting: Urology

## 2016-12-14 ENCOUNTER — Other Ambulatory Visit: Payer: Self-pay | Admitting: Family Medicine

## 2016-12-14 DIAGNOSIS — D75839 Thrombocytosis, unspecified: Secondary | ICD-10-CM

## 2016-12-14 DIAGNOSIS — D473 Essential (hemorrhagic) thrombocythemia: Secondary | ICD-10-CM

## 2016-12-15 ENCOUNTER — Ambulatory Visit: Payer: Medicare PPO | Admitting: Family Medicine

## 2016-12-15 ENCOUNTER — Other Ambulatory Visit: Payer: Medicare PPO

## 2016-12-16 ENCOUNTER — Other Ambulatory Visit: Payer: Self-pay | Admitting: Family Medicine

## 2016-12-16 DIAGNOSIS — J309 Allergic rhinitis, unspecified: Secondary | ICD-10-CM

## 2016-12-30 NOTE — Telephone Encounter (Signed)
2nd attempt to schedule fu carotid u/s patient recently sick and wants to wait will call back in a few weeks.

## 2017-01-01 ENCOUNTER — Ambulatory Visit: Payer: Medicare PPO | Admitting: Family Medicine

## 2017-01-04 ENCOUNTER — Ambulatory Visit (INDEPENDENT_AMBULATORY_CARE_PROVIDER_SITE_OTHER): Payer: Medicare PPO | Admitting: Family Medicine

## 2017-01-04 ENCOUNTER — Other Ambulatory Visit: Payer: Medicare PPO

## 2017-01-04 ENCOUNTER — Encounter: Payer: Self-pay | Admitting: Family Medicine

## 2017-01-04 DIAGNOSIS — J0100 Acute maxillary sinusitis, unspecified: Secondary | ICD-10-CM | POA: Diagnosis not present

## 2017-01-04 DIAGNOSIS — J329 Chronic sinusitis, unspecified: Secondary | ICD-10-CM | POA: Insufficient documentation

## 2017-01-04 MED ORDER — DOXYCYCLINE MONOHYDRATE 100 MG PO CAPS
100.0000 mg | ORAL_CAPSULE | Freq: Two times a day (BID) | ORAL | 0 refills | Status: DC
Start: 2017-01-04 — End: 2017-02-16

## 2017-01-04 NOTE — Progress Notes (Signed)
Pre-visit discussion using our clinic review tool. No additional management support is needed unless otherwise documented below in the visit note.  

## 2017-01-04 NOTE — Patient Instructions (Signed)
Nice to see you. You likely have a sinus infection. We will treat you with doxycycline. If you develop shortness of breath, cough productive of blood, or fevers please seek medical attention medially.

## 2017-01-04 NOTE — Progress Notes (Signed)
  [name], MD Phone: [phone]  Jill Snow is a 83 y.o. female who presents today for same-day visit.  Patient notes 3 weeks of symptoms. Started with sinus congestion and postnasal drip and then developed productive cough of yellow mucus. She's blowing clearish mucus out of her nose. Feels congested in her maxillary sinuses. No fevers. No ear symptoms. No shortness of breath. Has not been improving. Several of her relatives had similar symptoms. She's not tried any medications for this.  PMH: nonsmoker.   ROS see history of present illness  Objective  Physical Exam Vitals:   01/04/17 1002  BP: 136/72  Pulse: 62  Resp: 12  Temp: 98.4 F (36.9 C)    BP Readings from Last 3 Encounters:  01/04/17 136/72  12/10/16 130/70  12/03/16 (!) 127/49   Wt Readings from Last 3 Encounters:  01/04/17 161 lb 6.4 oz (73.2 kg)  12/10/16 164 lb (74.4 kg)  11/30/16 167 lb 11.2 oz (76.1 kg)    Physical Exam  Constitutional: No distress.  HENT:  Head: Normocephalic and atraumatic.  Mouth/Throat: Oropharynx is clear and moist. No oropharyngeal exudate.  Maxillary sinus tenderness to percussion, left TM obscured by cerumen initially though normal after irrigation by CMA, right TM normal  Eyes: Conjunctivae are normal. Pupils are equal, round, and reactive to light.  Neck: Neck supple.  Cardiovascular: Normal rate, regular rhythm and normal heart sounds.   Pulmonary/Chest: Effort normal and breath sounds normal.  Musculoskeletal: She exhibits no edema.  Lymphadenopathy:    She has no cervical adenopathy.  Neurological: She is alert. Gait normal.  Skin: Skin is warm and dry. She is not diaphoretic.     Assessment/Plan: Please see individual problem list.  Sinusitis Symptoms and duration consistent with bacterial sinusitis. We'll cover with doxycycline given adverse reactions to Augmentin in the past. Advised on yogurt or probiotic. Given return precautions.   No  orders of the defined types were placed in this encounter.   Meds ordered this encounter  Medications  . doxycycline (MONODOX) 100 MG capsule    Sig: Take 1 capsule (100 mg total) by mouth 2 (two) times daily.    Dispense:  14 capsule    Refill:  0    [name], MD [place]

## 2017-01-04 NOTE — Assessment & Plan Note (Signed)
Symptoms and duration consistent with bacterial sinusitis. We'll cover with doxycycline given adverse reactions to Augmentin in the past. Advised on yogurt or probiotic. Given return precautions.
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
## 2017-01-05 ENCOUNTER — Ambulatory Visit: Payer: Self-pay | Admitting: Urology

## 2017-01-14 ENCOUNTER — Ambulatory Visit: Payer: Medicare PPO | Admitting: Gastroenterology

## 2017-01-21 ENCOUNTER — Ambulatory Visit: Payer: PPO | Admitting: Family Medicine

## 2017-01-26 ENCOUNTER — Other Ambulatory Visit: Payer: Medicare PPO

## 2017-01-29 ENCOUNTER — Encounter: Payer: Self-pay | Admitting: Urology

## 2017-02-03 ENCOUNTER — Ambulatory Visit: Payer: Self-pay | Admitting: Urology

## 2017-02-16 ENCOUNTER — Encounter: Payer: Self-pay | Admitting: Family Medicine

## 2017-02-16 ENCOUNTER — Ambulatory Visit (INDEPENDENT_AMBULATORY_CARE_PROVIDER_SITE_OTHER): Payer: Medicare PPO | Admitting: Family Medicine

## 2017-02-16 DIAGNOSIS — D473 Essential (hemorrhagic) thrombocythemia: Secondary | ICD-10-CM

## 2017-02-16 DIAGNOSIS — I480 Paroxysmal atrial fibrillation: Secondary | ICD-10-CM | POA: Diagnosis not present

## 2017-02-16 DIAGNOSIS — Z9989 Dependence on other enabling machines and devices: Secondary | ICD-10-CM

## 2017-02-16 DIAGNOSIS — N1339 Other hydronephrosis: Secondary | ICD-10-CM | POA: Diagnosis not present

## 2017-02-16 DIAGNOSIS — G4733 Obstructive sleep apnea (adult) (pediatric): Secondary | ICD-10-CM

## 2017-02-16 DIAGNOSIS — E559 Vitamin D deficiency, unspecified: Secondary | ICD-10-CM | POA: Diagnosis not present

## 2017-02-16 DIAGNOSIS — M542 Cervicalgia: Secondary | ICD-10-CM | POA: Diagnosis not present

## 2017-02-16 DIAGNOSIS — D75839 Thrombocytosis, unspecified: Secondary | ICD-10-CM

## 2017-02-16 LAB — CBC
HEMATOCRIT: 40.3 % (ref 36.0–46.0)
Hemoglobin: 13.7 g/dL (ref 12.0–15.0)
MCHC: 34.1 g/dL (ref 30.0–36.0)
MCV: 93.7 fl (ref 78.0–100.0)
Platelets: 184 10*3/uL (ref 150.0–400.0)
RBC: 4.3 Mil/uL (ref 3.87–5.11)
RDW: 13.2 % (ref 11.5–15.5)
WBC: 4.7 10*3/uL (ref 4.0–10.5)

## 2017-02-16 MED ORDER — ERGOCALCIFEROL 50 MCG (2000 UT) PO CAPS: 2000.0000 [IU] | ORAL_CAPSULE | Freq: Every day | ORAL | 3 refills | Status: DC

## 2017-02-16 NOTE — Patient Instructions (Signed)
Nice to see you. We will send a message to your cardiologist regarding your A. fib. Please start using your CPAP again. Please monitor your neck and it does not continue to improve please let us know. He can use heat on this area. We will get you in 2 see urology regarding the slight hydronephrosis that was previously seen.
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
## 2017-02-16 NOTE — Assessment & Plan Note (Signed)
Started on vitamin D supplementation. Borderline low previously.

## 2017-02-16 NOTE — Assessment & Plan Note (Signed)
Noted on prior imaging. We'll get her reset up with urology.
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
## 2017-02-16 NOTE — Assessment & Plan Note (Signed)
Not using her CPAP. She does note some hypersomnia. I encouraged her to start back with this.

## 2017-02-16 NOTE — Assessment & Plan Note (Signed)
A sinus rhythm on exam. Rate controlled. She'll continue to monitor. She's not on anticoagulation. Followed by cardiology. Discussed risk of stroke though it appears that her A. fib was in an isolated setting of being ill. We'll message her cardiologist regarding anticoagulation.

## 2017-02-18 ENCOUNTER — Telehealth: Payer: Self-pay

## 2017-02-18 NOTE — Telephone Encounter (Signed)
It is okay to change the prescription as outlined in your message. Thanks.

## 2017-02-18 NOTE — Telephone Encounter (Signed)
Patent would like rx for new cpap, I have called and requested a form to be faxed from [place] [facility] patient, per patient request

## 2017-02-18 NOTE — Telephone Encounter (Signed)
We will await this form. Please place it in my folder when this comes in. Thanks.

## 2017-02-18 NOTE — Telephone Encounter (Signed)
[facility] called and stated they do not have ergocalciferol as you prescribed in 2000 units they do have cholecalciferol over the counter in 2000, ok to change?

## 2017-02-19 ENCOUNTER — Other Ambulatory Visit: Payer: Self-pay

## 2017-02-19 ENCOUNTER — Telehealth: Payer: Self-pay | Admitting: Family Medicine

## 2017-02-19 MED ORDER — ERGOCALCIFEROL 50 MCG (2000 UT) PO CAPS: 2000.0000 [IU] | ORAL_CAPSULE | Freq: Every day | ORAL | 3 refills | Status: DC

## 2017-02-19 NOTE — Telephone Encounter (Signed)
Pt came into office to see what the statis is on the cpap

## 2017-02-19 NOTE — Telephone Encounter (Signed)
Pharmacy called and wanted to know if it is ok to change pt's Vitamin D2 to Vitamin D3. Please advise, thank you!  Pharmacy - [id number] - , [place]

## 2017-02-19 NOTE — Telephone Encounter (Signed)
Please see if they have 1000 international unit tablets of the prescription vitamin D supplementation. If they do not it is fine for the patient to take the over-the-counter version. It should work just as well as the prescription version.

## 2017-02-19 NOTE — Telephone Encounter (Signed)
Spoke with Shirlean Mylar at [facility]. Gave order to change, thanks

## 2017-02-19 NOTE — Telephone Encounter (Signed)
Please advise, Patient was upset that she had to switch to the OTC version at [facility] and so I tried [facility] it to the [facility], which it looks like they don't have it either, please advise, thanks

## 2017-02-22 ENCOUNTER — Telehealth: Payer: Self-pay | Admitting: Family Medicine

## 2017-02-22 NOTE — Telephone Encounter (Signed)
I have not received this yet. Please contact them again. Thanks.

## 2017-02-22 NOTE — Telephone Encounter (Signed)
Patient states the pharmacy does not have 2000mg  of ergocalciferol is there a different dose she can have

## 2017-02-22 NOTE — Telephone Encounter (Signed)
-----   Message from Minna Merritts, MD sent at 02/21/2017  2:01 PM EDT ----- She had some atrial fib in 2011 Also had some flutter in 04/2013, needed cardioversion as we had trouble slowing her rate down She does meet criteria for anticoagulation by chads vasc My last discussion with her she was not interested but if she has changed her mind, the best thing is probably to be on something like eliquis or xarelto TG  ----- Message ----- From: Leone Haven, MD Sent: 02/16/2017   3:32 PM To: Minna Merritts, MD  Hi Dr Rockey Situ,   I saw Mrs Shoaf in the office today for follow-up. She noted no issues with afib today. I just wanted to touch base on the need for anticoagulation and get your thoughts to see if she would benefit from this for stroke prevention. It appears that her afib might have occurred in the setting of an illness in 2011 and she has not had any issues since then. I get her CHADS-VASC score as 3. She is willing to consider anticoagulation if needed, though if it was isolated afib with an illness she may not need it. Let me know what you think. Thanks for the help.   Randall Hiss

## 2017-02-22 NOTE — Telephone Encounter (Signed)
Left message to return call 

## 2017-02-22 NOTE — Telephone Encounter (Signed)
Have you received this?

## 2017-02-22 NOTE — Telephone Encounter (Signed)
Can you check with the patient and see if she is willing to start on anticoagulation as we discussed at her office visit? I heard back from her cardiologist and they recommended it as long as she is willing. Thanks.

## 2017-02-22 NOTE — Telephone Encounter (Signed)
She can try 1000 mg capsules.

## 2017-02-22 NOTE — Telephone Encounter (Signed)
Pharmacist states the insurance will not cover this due to it being available over the counter. He states it is $7 for 100 capsules and he will contact the patient to discuss working out the price

## 2017-02-22 NOTE — Telephone Encounter (Signed)
Can you call pharmacy to see if this is available for the patient, thanks

## 2017-02-22 NOTE — Telephone Encounter (Signed)
[place] patient

## 2017-02-22 NOTE — Telephone Encounter (Signed)
See other message

## 2017-02-23 ENCOUNTER — Telehealth: Payer: Self-pay | Admitting: *Deleted

## 2017-02-23 MED ORDER — APIXABAN 5 MG PO TABS: 5.0000 mg | ORAL_TABLET | Freq: Two times a day (BID) | ORAL | 5 refills | Status: DC

## 2017-02-23 NOTE — Telephone Encounter (Signed)
Left message to return call 

## 2017-02-23 NOTE — Telephone Encounter (Signed)
I will send in Eliquis. This typically has slightly lower bleeding risk. It is twice daily. If she develops bleeding that she is unable to get to stop within 10 or 15 minutes or if it is excessively heavy from anywhere she should be evaluated.

## 2017-02-23 NOTE — Telephone Encounter (Signed)
Pt is rescheduled for her GI appt

## 2017-02-23 NOTE — Telephone Encounter (Signed)
Patient notified

## 2017-02-23 NOTE — Telephone Encounter (Signed)
Patient stated that she cancelled her gastrology appt. She is now requesting to have this rescheduled  Pt contact [phone]

## 2017-02-23 NOTE — Telephone Encounter (Signed)
Patient states she is ok with starting this, she would like for you to prescribe the medication you feel is best for her.

## 2017-02-24 ENCOUNTER — Telehealth: Payer: Self-pay | Admitting: *Deleted

## 2017-02-24 NOTE — Telephone Encounter (Signed)
Called pharmacy and they stated they do not carry vit d2 but do carry d3, verbal order given to change to vit d3 per DR.Caryl Bis

## 2017-02-24 NOTE — Telephone Encounter (Signed)
Patient stated that pharmacy would not refill her vitamin D, due to not knowing her levels Pt contact [phone]

## 2017-02-25 ENCOUNTER — Telehealth: Payer: Self-pay | Admitting: Cardiovascular Disease

## 2017-02-25 NOTE — Telephone Encounter (Signed)
Patient notified rx is ready at pharmacy
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
## 2017-02-25 NOTE — Telephone Encounter (Signed)
Would she like to see Dr. Rockey Situ tomorrow @ 7:40am?
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
## 2017-02-25 NOTE — Telephone Encounter (Signed)
Pt called back returning your call. Pt states that she woill be available for the next 15 minutes, after that she will be tied up and will call us back. Call pt @ [phone]

## 2017-02-26 ENCOUNTER — Telehealth: Payer: Self-pay | Admitting: *Deleted

## 2017-02-26 ENCOUNTER — Encounter: Payer: Self-pay | Admitting: Cardiovascular Disease

## 2017-02-26 ENCOUNTER — Ambulatory Visit (INDEPENDENT_AMBULATORY_CARE_PROVIDER_SITE_OTHER): Payer: Medicare PPO | Admitting: Cardiovascular Disease

## 2017-02-26 VITALS — BP 140/78 | HR 52 | Ht 66.5 in | Wt 162.0 lb

## 2017-02-26 DIAGNOSIS — A4151 Sepsis due to Escherichia coli [E. coli]: Secondary | ICD-10-CM

## 2017-02-26 DIAGNOSIS — I480 Paroxysmal atrial fibrillation: Secondary | ICD-10-CM

## 2017-02-26 DIAGNOSIS — E7800 Pure hypercholesterolemia, unspecified: Secondary | ICD-10-CM

## 2017-02-26 DIAGNOSIS — Z9989 Dependence on other enabling machines and devices: Secondary | ICD-10-CM | POA: Diagnosis not present

## 2017-02-26 DIAGNOSIS — G4733 Obstructive sleep apnea (adult) (pediatric): Secondary | ICD-10-CM | POA: Diagnosis not present

## 2017-02-26 DIAGNOSIS — I10 Essential (primary) hypertension: Secondary | ICD-10-CM | POA: Diagnosis not present

## 2017-02-26 MED ORDER — AMIODARONE HCL 200 MG PO TABS: 200.0000 mg | ORAL_TABLET | Freq: Every day | ORAL | 3 refills | Status: DC

## 2017-02-26 MED ORDER — SIMVASTATIN 40 MG PO TABS: 40.0000 mg | ORAL_TABLET | Freq: Every day | ORAL | 4 refills | Status: DC

## 2017-02-26 MED ORDER — AZITHROMYCIN 250 MG PO TABS: 250.0000 mg | ORAL_TABLET | ORAL | 0 refills | Status: DC

## 2017-02-26 MED ORDER — APIXABAN 5 MG PO TABS: 5.0000 mg | ORAL_TABLET | Freq: Two times a day (BID) | ORAL | 3 refills | Status: DC

## 2017-02-26 MED ORDER — VITAMIN D (ERGOCALCIFEROL) 1.25 MG (50000 UNIT) PO CAPS: 50000.0000 [IU] | ORAL_CAPSULE | ORAL | 1 refills | Status: DC

## 2017-02-26 NOTE — Telephone Encounter (Signed)
Patient stated that Dr. Rockey Situ (cardiologist) advised pt would benefit from B12 injection. Please advise  Pt contact [phone]

## 2017-02-26 NOTE — Patient Instructions (Addendum)
Message to PMD about CPAP  Medication Instructions:   We will look into VitD and B12 Per pharmacy, take 50,000 units per week x 8 weeks, then decrease to 1,000-2,000 units per week (this is OTC, not rx) Ask PCP about B12 shots in their office  ZPAK 2 pills the first day then one a day for 4 days  Labwork:  No new labs needed  Testing/Procedures:  No further testing at this time   Follow-Up: It was a pleasure seeing you in the office today. Please call us if you have new issues that need to be addressed before your next appt.  [phone]  Your physician wants you to follow-up in: 12 months.  You will receive a reminder letter in the mail two months in advance. If you don't receive a letter, please call our office to schedule the follow-up appointment.  If you need a refill on your cardiac medications before your next appointment, please call your pharmacy.

## 2017-02-26 NOTE — Telephone Encounter (Signed)
Patient notified and states Dr.Gollan requested b12 injections today at her visit, per his note it states per patient request for fatigue we could try b12 and vitamin d. Informed her you recommend b12 1000 units over the counter

## 2017-02-26 NOTE — Telephone Encounter (Signed)
Patients b12 was 245 in January 2018

## 2017-02-26 NOTE — Telephone Encounter (Signed)
She could try over-the-counter oral supplementation for B12. 1000 g daily. We can plan on rechecking her B12 after 1-2 months of oral supplementation. Please check to make sure that her CPAP has been ordered. Thanks.

## 2017-03-01 ENCOUNTER — Telehealth: Payer: Self-pay

## 2017-03-01 DIAGNOSIS — E538 Deficiency of other specified B group vitamins: Secondary | ICD-10-CM

## 2017-03-01 NOTE — Telephone Encounter (Signed)
Patient came in to the office upset that no one is listening to her.  She states that PCPs CMA was rude and wont listen and that she wants B12 injections and not OTC tablets.  I researched and looks like patient saw Cardiology who suggested her to follow up with PCP about low B12.  Message was sent on the 13th to start shots, last lab values were from January for b12, explained that her PCP advised for OTC for 1-2 months and then to recheck to see if injections were warranted.  Patient again got upset saying that doctors prescribe b12 injections and I agreed, but reminded her that her PCP wants to recheck things as the level she is going off was from January and it was July now.  Her friend that was with her got upset at this point and said well she had lab a few weeks ago, what do you mean there from January.  I explained that her labs on the 3rd of July were not checking a B12 level.  Patient then said well do I need to find a new doctor then, I explained to her that her PCP was out of the office for the week and that I would send him a message with her concerns and requests for shots not the OTC and that I would try to get back with her when the PCP returns next week.  She requested copies of her labs, I gave as requested.

## 2017-03-08 ENCOUNTER — Other Ambulatory Visit: Payer: Medicare PPO

## 2017-03-08 ENCOUNTER — Other Ambulatory Visit (INDEPENDENT_AMBULATORY_CARE_PROVIDER_SITE_OTHER): Payer: Medicare PPO

## 2017-03-08 DIAGNOSIS — E538 Deficiency of other specified B group vitamins: Secondary | ICD-10-CM

## 2017-03-08 LAB — VITAMIN B12: Vitamin B-12: 355 pg/mL (ref 211–911)

## 2017-03-08 NOTE — Addendum Note (Signed)
Addended by: Bevelyn Ngo on: 03/08/2017 08:56 AM   Modules accepted: Orders

## 2017-03-08 NOTE — Telephone Encounter (Signed)
Spoke with patient and schedule for labs today, ordered b12 level. thanks

## 2017-03-08 NOTE — Telephone Encounter (Signed)
Patient's B12 was in the low normal range on last check. This does not typically necessitate injectable B12 supplementation. Given that it seems the patient would prefer injectable supplementation we could check a B12 level again. If it is still in the low normal range or it is low we can start on B12 supplementation in the office by injection. Thanks.

## 2017-03-11 ENCOUNTER — Ambulatory Visit: Payer: Medicare PPO | Admitting: Family Medicine

## 2017-03-11 ENCOUNTER — Telehealth: Payer: Self-pay | Admitting: Family Medicine

## 2017-03-11 NOTE — Telephone Encounter (Signed)
Spoke with the patient again, patient advised of your statements and scheduled for an appt tomorrow, she will go to the ED or urgent care if it gets worse or any other symptoms occur, thanks

## 2017-03-11 NOTE — Telephone Encounter (Signed)
Given bilateral nature and lack of other symptoms we can evaluate her tomorrow in the office to see if there is anything to be done regarding this. She should keep her legs propped up. If she develops chest pain or shortness of breath or one leg becomes more swollen than the other or she develops erythema or signs of infection she should be evaluated today. Thanks.

## 2017-03-11 NOTE — Telephone Encounter (Signed)
Patient went to visit a friend today and the friend is a Marine scientist, she noticed that both her legs have edema in them.  From the mid thigh down to her ankles.  The foot are not swollen.  The swelling has improved with elevating her legs. No redness noted, not warm or hot to touch, area looks normal except for swelling.  No SOB, no CP.  Please advise, thanks

## 2017-03-11 NOTE — Telephone Encounter (Signed)
Pt called c/o leg swelling from mid thigh down, only has been like this today. Denies redness, or hot to the touch. Pt wanted to know if we could call something in. Please advise, thank you!  Call pt @ [phone]

## 2017-03-12 ENCOUNTER — Other Ambulatory Visit: Payer: Self-pay | Admitting: Family Medicine

## 2017-03-12 ENCOUNTER — Ambulatory Visit (INDEPENDENT_AMBULATORY_CARE_PROVIDER_SITE_OTHER): Payer: Medicare PPO

## 2017-03-12 ENCOUNTER — Telehealth: Payer: Self-pay | Admitting: Family Medicine

## 2017-03-12 ENCOUNTER — Ambulatory Visit (INDEPENDENT_AMBULATORY_CARE_PROVIDER_SITE_OTHER): Payer: Medicare PPO | Admitting: Family Medicine

## 2017-03-12 ENCOUNTER — Encounter: Payer: Self-pay | Admitting: Family Medicine

## 2017-03-12 VITALS — BP 134/62 | HR 56 | Temp 98.3°F | Ht 66.5 in | Wt 164.0 lb

## 2017-03-12 DIAGNOSIS — R0989 Other specified symptoms and signs involving the circulatory and respiratory systems: Secondary | ICD-10-CM | POA: Diagnosis not present

## 2017-03-12 DIAGNOSIS — R05 Cough: Secondary | ICD-10-CM

## 2017-03-12 DIAGNOSIS — R600 Localized edema: Secondary | ICD-10-CM | POA: Diagnosis not present

## 2017-03-12 DIAGNOSIS — R059 Cough, unspecified: Secondary | ICD-10-CM | POA: Insufficient documentation

## 2017-03-12 DIAGNOSIS — R1013 Epigastric pain: Secondary | ICD-10-CM | POA: Insufficient documentation

## 2017-03-12 DIAGNOSIS — R0600 Dyspnea, unspecified: Secondary | ICD-10-CM

## 2017-03-12 DIAGNOSIS — I480 Paroxysmal atrial fibrillation: Secondary | ICD-10-CM

## 2017-03-12 LAB — CBC
HCT: 43.8 % (ref 36.0–46.0)
Hemoglobin: 14.6 g/dL (ref 12.0–15.0)
MCHC: 33.4 g/dL (ref 30.0–36.0)
MCV: 95 fl (ref 78.0–100.0)
PLATELETS: 198 10*3/uL (ref 150.0–400.0)
RBC: 4.61 Mil/uL (ref 3.87–5.11)
RDW: 12.7 % (ref 11.5–15.5)
WBC: 4.4 10*3/uL (ref 4.0–10.5)

## 2017-03-12 LAB — COMPREHENSIVE METABOLIC PANEL
ALBUMIN: 4.1 g/dL (ref 3.5–5.2)
ALK PHOS: 54 U/L (ref 39–117)
ALT: 17 U/L (ref 0–35)
AST: 27 U/L (ref 0–37)
BUN: 12 mg/dL (ref 6–23)
CO2: 29 meq/L (ref 19–32)
CREATININE: 0.87 mg/dL (ref 0.40–1.20)
Calcium: 9.9 mg/dL (ref 8.4–10.5)
Chloride: 106 mEq/L (ref 96–112)
GFR: 66.37 mL/min (ref >60.00)
Glucose, Bld: 84 mg/dL (ref 70–99)
POTASSIUM: 4.3 meq/L (ref 3.5–5.1)
SODIUM: 142 meq/L (ref 135–145)
Total Bilirubin: 1.1 mg/dL (ref 0.2–1.2)
Total Protein: 6.9 g/dL (ref 6.0–8.3)

## 2017-03-12 LAB — BRAIN NATRIURETIC PEPTIDE: Pro B Natriuretic peptide (BNP): 168 pg/mL — ABNORMAL HIGH (ref 0.0–100.0)

## 2017-03-12 LAB — TSH: TSH: 3.36 u[IU]/mL (ref 0.35–4.50)

## 2017-03-12 LAB — LIPASE: Lipase: 26 U/L (ref 11.0–59.0)

## 2017-03-12 MED ORDER — DOXYCYCLINE HYCLATE 100 MG PO TABS: 100.0000 mg | ORAL_TABLET | Freq: Two times a day (BID) | ORAL | 0 refills | Status: DC

## 2017-03-12 NOTE — Progress Notes (Signed)
Pre visit review using our clinic review tool, if applicable. No additional management support is needed unless otherwise documented below in the visit note. 

## 2017-03-12 NOTE — Assessment & Plan Note (Addendum)
Mild epigastric tenderness on exam. Suspect this is related to her persistent coughing though we'll obtain a lipase to evaluate for pancreatic cause. Other lab work as outlined below.

## 2017-03-12 NOTE — Assessment & Plan Note (Signed)
Sinus rhythm on exam. Rate controlled. Patient was recently started on Eliquis though has stopped this and she is unable to verbalize exactly how she fell while taking it. It along discussion with her regarding her risk of stroke related to her atrial fibrillation. Discussed options of taking Coumadin, Xarelto, or Eliquis for stroke prevention. Discussed bleeding risks of these medications. She opted to trial the Eliquis again. She'll let us know if she does not respond well to it.

## 2017-03-12 NOTE — Assessment & Plan Note (Signed)
New issue. No swelling at this time. I doubt this is related to CHF though she has had some breathing issues. We'll check a BNP. We'll check lab work as outlined below as well. Advised to keep her legs elevated as much as possible.

## 2017-03-12 NOTE — Assessment & Plan Note (Signed)
This continued to not feel great over the last several months. Notes some tiredness. Has had cough and chest congestion. Did improve somewhat with azithromycin though continues to have some symptoms. We'll obtain a chest x-ray. We'll obtain lab work as outlined below as well to evaluate the tiredness. Had a long discussion with her regarding B12 supplementation and advised that her B12 was in the normal range and had actually improved from the last time without any supplementation. Discussed that there is no indication to give her injectable B12 supplementation.
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
## 2017-03-12 NOTE — Telephone Encounter (Signed)
Pt called back returning your call. Please advise, thank you!  Call pt @ [phone]

## 2017-03-12 NOTE — Patient Instructions (Signed)
Nice to see you. I am sorry you are feeling poorly. We'll check lab work today and check a chest x-ray. If you develop chest pain, shortness of breath, cough productive of blood, or fevers please seek medical attention immediately.

## 2017-03-12 NOTE — Telephone Encounter (Signed)
Please see result note 

## 2017-03-19 ENCOUNTER — Other Ambulatory Visit: Payer: Self-pay | Admitting: Family Medicine

## 2017-03-19 DIAGNOSIS — I6523 Occlusion and stenosis of bilateral carotid arteries: Secondary | ICD-10-CM

## 2017-03-30 ENCOUNTER — Ambulatory Visit (INDEPENDENT_AMBULATORY_CARE_PROVIDER_SITE_OTHER): Payer: Medicare PPO | Admitting: Gastroenterology

## 2017-03-30 ENCOUNTER — Other Ambulatory Visit: Payer: Self-pay | Admitting: Family Medicine

## 2017-03-30 ENCOUNTER — Encounter: Payer: Self-pay | Admitting: Gastroenterology

## 2017-03-30 VITALS — BP 128/68 | Temp 97.9°F | Ht 66.0 in | Wt 163.4 lb

## 2017-03-30 DIAGNOSIS — K760 Fatty (change of) liver, not elsewhere classified: Secondary | ICD-10-CM

## 2017-03-30 NOTE — Progress Notes (Signed)
Cephas Darby, MD [address]  [place]  [place], [place] [address]  Main: [phone]  Fax: [phone]    Gastroenterology Consultation  Referring Provider:     Leone Haven, MD Primary Care Physician:  Leone Haven, MD Primary Gastroenterologist:  Dr. Cephas Darby Reason for Consultation:     Fatty liver        HPI:   Jill Snow is a 83 y.o. y/o female referred for consultation & management  by Dr. Caryl Bis, Angela Adam, MD for fatty liver based on ultrasound and remote history of mildly elevated transaminases. Her AST was mildly elevated in 2017 and has been normal since April 2018. Her last set of LFTs from 03/12/2017 was normal. Ultrasound in August 2017 showed fatty liver. She she denies any symptoms today. She has been doing fairly well for her age and independent of her ADLs. She likes to eat a lot of carbs, drinks carbonated beverages. She does not smoke or drink alcohol. No history of prior blood transfusions. She does not take Tylenol or NSAIDs. She does exercise regularly.  Past Medical History:  Diagnosis Date  . Atrial fibrillation ([place])   . Atrial flutter ([place])    a. s/p successful TEE/DCCV in 2014; b. TEE 2014 showed EF 45-50%, mild bi-atrial enlargement, mod MR  . Dyspnea   . History of chicken pox   . Hypercholesterolemia   . Hypertension   . Mitral regurgitation    a. echo 2014: EF 40-45%, mildly dilated RV, mod reduced RV systolic fxn, mod dilated LA, Mild to mod MR, mod TR, mildly elevated PASP  . PAF (paroxysmal atrial fibrillation) ([place])    a. initial episode 2011; b. not on long term anticoagulation since 06/2013  . RBBB (right bundle branch block)   . Seasonal allergies   . Sleep apnea    a. on CPAP    Past Surgical History:  Procedure Laterality Date  . ABDOMINAL HYSTERECTOMY    . TONSILLECTOMY AND ADENOIDECTOMY  1947  . tummy tuck      Prior to Admission medications   Medication Sig Start Date End Date Taking?  Authorizing Provider  amiodarone (PACERONE) 200 MG tablet Take 1 tablet (200 mg total) by mouth daily. 02/26/17  Yes Minna Merritts, MD  montelukast (SINGULAIR) 10 MG tablet TAKE ONE TABLET EVERY DAY 12/17/16  Yes Leone Haven, MD  simvastatin (ZOCOR) 40 MG tablet Take 1 tablet (40 mg total) by mouth daily at 6 PM. 02/26/17  Yes Gollan, Kathlene November, MD  Vitamin D, Ergocalciferol, (DRISDOL) 50000 units CAPS capsule Take 1 capsule (50,000 Units total) by mouth every 7 (seven) days. 02/26/17  Yes Gollan, Kathlene November, MD  apixaban (ELIQUIS) 5 MG TABS tablet Take 1 tablet (5 mg total) by mouth 2 (two) times daily. Patient not taking: Reported on 03/30/2017 02/26/17   Minna Merritts, MD  doxycycline (VIBRA-TABS) 100 MG tablet Take 1 tablet (100 mg total) by mouth 2 (two) times daily. Patient not taking: Reported on 03/30/2017 03/12/17   Leone Haven, MD    Family History  Problem Relation Age of Onset  . Stroke Mother   . Heart attack Mother   . Arthritis Mother   . Hyperlipidemia Mother   . Heart disease Mother   . Diabetes Mother   . Diabetes Sister   . Cancer Daughter   . Heart disease Daughter   . Diabetes Daughter   . Diabetes Brother   .  Diabetes Son   . Diabetes Maternal Grandmother   . Diabetes Maternal Grandfather      Social History  Substance Use Topics  . Smoking status: Never Smoker  . Smokeless tobacco: Never Used  . Alcohol use No    Allergies as of 03/30/2017 - Review Complete 03/30/2017  Allergen Reaction Noted  . Codeine Anaphylaxis 07/17/2011  . Augmentin [amoxicillin-pot clavulanate] Nausea And Vomiting 11/28/2014  . Crestor [rosuvastatin] Other (See Comments) 10/09/2014    Review of Systems:    All systems reviewed and negative except where noted in HPI.   Physical Exam:  BP 128/68   Temp 97.9 F (36.6 C) (Oral)   Ht 5\' 6"  (1.676 m)   Wt 74.1 kg (163 lb 6.4 oz)   BMI 26.37 kg/m  No LMP recorded. Patient has had a hysterectomy. Psych:  Alert  and cooperative. Normal mood and affect. General:   Alert,  Well-developed, well-nourished, pleasant and cooperative in NAD Head:  Normocephalic and atraumatic. Eyes:  Sclera clear, no icterus.   Conjunctiva pink. Ears:  Normal auditory acuity. Nose:  No deformity, discharge, or lesions. Mouth:  No deformity or lesions,oropharynx pink & moist. Neck:  Supple; no masses or thyromegaly. Lungs:  Respirations even and unlabored.  Clear throughout to auscultation.   No wheezes, crackles, or rhonchi. No acute distress. Heart:  Regular rate and rhythm; no murmurs, clicks, rubs, or gallops. Abdomen:  Normal bowel sounds.  No bruits.  Soft, non-tender and non-distended without masses, hepatosplenomegaly or hernias noted.  No guarding or rebound tenderness.    Msk:  Symmetrical without gross deformities. Good, equal movement & strength bilaterally. Pulses:  Normal pulses noted. Extremities:  No clubbing or edema.  No cyanosis. Neurologic:  Alert and oriented x3;  grossly normal neurologically. Skin:  Intact without significant lesions or rashes. No jaundice. Lymph Nodes:  No significant cervical adenopathy. Psych:  Alert and cooperative. Normal mood and affect.  Imaging Studies: Reviewed  Assessment and Plan:   Jill Snow is a 83 y.o. y/o female has been seen in consultation for elevated transaminases and fatty liver. Currently her LFTs have been normal. I do not recommend any further workup at this time. I counseled her on low-fat and low-carb diet, Avoid carbonated beverages. Encouraged her to continue exercise. She is very healthy for her age. Should her LFTs rise in future, I would be happy to see her for further evaluation.  Follow up in as needed   Cephas Darby, MD

## 2017-03-31 DIAGNOSIS — H2513 Age-related nuclear cataract, bilateral: Secondary | ICD-10-CM | POA: Diagnosis not present

## 2017-04-01 ENCOUNTER — Ambulatory Visit (INDEPENDENT_AMBULATORY_CARE_PROVIDER_SITE_OTHER): Payer: Medicare PPO

## 2017-04-01 ENCOUNTER — Other Ambulatory Visit: Payer: Self-pay | Admitting: Family Medicine

## 2017-04-01 ENCOUNTER — Other Ambulatory Visit: Payer: Self-pay

## 2017-04-01 ENCOUNTER — Ambulatory Visit: Payer: Medicare PPO

## 2017-04-01 DIAGNOSIS — R0600 Dyspnea, unspecified: Secondary | ICD-10-CM

## 2017-04-01 DIAGNOSIS — E0789 Other specified disorders of thyroid: Secondary | ICD-10-CM

## 2017-04-01 DIAGNOSIS — I6523 Occlusion and stenosis of bilateral carotid arteries: Secondary | ICD-10-CM

## 2017-04-01 DIAGNOSIS — E079 Disorder of thyroid, unspecified: Secondary | ICD-10-CM

## 2017-04-01 LAB — VAS US CAROTID
LCCADSYS: 82 cm/s
LEFT ECA DIAS: -8 cm/s
LEFT VERTEBRAL DIAS: 22 cm/s
Left CCA dist dias: 17 cm/s
Left CCA prox dias: -17 cm/s
Left CCA prox sys: -97 cm/s
Left ICA dist dias: -28 cm/s
Left ICA dist sys: -87 cm/s
Left ICA prox dias: -13 cm/s
Left ICA prox sys: -75 cm/s
RCCADSYS: -47 cm/s
RCCAPDIAS: -13 cm/s
RCCAPSYS: -69 cm/s
RIGHT ECA DIAS: 10 cm/s
RIGHT VERTEBRAL DIAS: 14 cm/s

## 2017-04-02 ENCOUNTER — Ambulatory Visit: Payer: Medicare PPO | Admitting: Family Medicine

## 2017-04-02 DIAGNOSIS — Z0289 Encounter for other administrative examinations: Secondary | ICD-10-CM

## 2017-04-05 NOTE — Telephone Encounter (Signed)
Called pt to inform her of the ultrasound that was scheduled for her on Friday 8/24 @ 1:45. She said that she did not know that she needed an ultrasound. She was told that everything looked perfect. Please call pt to go over the information with her. Pt phone [phone]

## 2017-04-05 NOTE — Telephone Encounter (Signed)
Please contact the patient and let her know the following information:  "Please let the patient know that her echo did not reveal any signs of heart failure. It did show mildly elevated pulmonary artery pressures. She would benefit from following with her cardiologist for this. Her carotid ultrasounds did not reveal any significant stenosis though did pick up on a possible thyroid lesion. We should obtain a thyroid ultrasound and likely have her see ENT for that. I'll place an order for the thyroid ultrasound to evaluate further. Thanks."  This is outlined in her prior result note though we were unable to get in touch with her.

## 2017-04-05 NOTE — Telephone Encounter (Signed)
Patient notified, please call patient with instructions on ultrasound, patient states she was not aware she was scheduled for friday

## 2017-04-09 ENCOUNTER — Ambulatory Visit
Admission: RE | Admit: 2017-04-09 | Discharge: 2017-04-09 | Disposition: A | Discharge status: Home / Self Care | Payer: Medicare PPO | Source: Ambulatory Visit | Attending: Family Medicine | Admitting: Family Medicine

## 2017-04-09 DIAGNOSIS — E041 Nontoxic single thyroid nodule: Secondary | ICD-10-CM | POA: Diagnosis not present

## 2017-04-09 DIAGNOSIS — E079 Disorder of thyroid, unspecified: Secondary | ICD-10-CM

## 2017-04-09 DIAGNOSIS — E049 Nontoxic goiter, unspecified: Secondary | ICD-10-CM | POA: Diagnosis not present

## 2017-04-09 DIAGNOSIS — E0789 Other specified disorders of thyroid: Secondary | ICD-10-CM

## 2017-04-15 ENCOUNTER — Other Ambulatory Visit: Payer: Self-pay | Admitting: Family Medicine

## 2017-04-15 DIAGNOSIS — E041 Nontoxic single thyroid nodule: Secondary | ICD-10-CM

## 2017-04-16 ENCOUNTER — Telehealth: Payer: Self-pay | Admitting: *Deleted

## 2017-04-16 DIAGNOSIS — E041 Nontoxic single thyroid nodule: Secondary | ICD-10-CM | POA: Diagnosis not present

## 2017-04-16 NOTE — Telephone Encounter (Signed)
Please advise 

## 2017-04-16 NOTE — Telephone Encounter (Signed)
[facility] has requested a fax of pt's medication and office notes in reference to needing the referral Contact [phone] Fax [phone]

## 2017-04-22 ENCOUNTER — Telehealth: Payer: Self-pay | Admitting: *Deleted

## 2017-04-22 NOTE — Telephone Encounter (Signed)
Lizz from [facility] requested a call to verify if pt is taking Eliquis  Contact Lizz [phone] Ext [phone]

## 2017-04-22 NOTE — Telephone Encounter (Signed)
It appears that she is back on Eliquis.

## 2017-04-22 NOTE — Telephone Encounter (Signed)
Please clarify it looks like last office visit re-started

## 2017-04-22 NOTE — Telephone Encounter (Signed)
Spoke with [facility] E[facility] , patient is to be  scheduled for thyroid FNA . However she reported that she is not taking Eliquis , due to she looked up side effects.  Since medication is on medication list hospital needs something in writing that patient will be off Eliquis  2 days prior to procedure . Needs to be faxed to to [facility] at [facility] [phone].

## 2017-04-23 ENCOUNTER — Other Ambulatory Visit: Payer: Self-pay | Admitting: Otolaryngology

## 2017-04-23 ENCOUNTER — Other Ambulatory Visit: Payer: Self-pay | Admitting: Family Medicine

## 2017-04-23 DIAGNOSIS — E041 Nontoxic single thyroid nodule: Secondary | ICD-10-CM

## 2017-04-23 NOTE — Telephone Encounter (Signed)
Will remove from medication list. No need for additional documentation as she is not on this drug.

## 2017-04-23 NOTE — Telephone Encounter (Signed)
Left voice mail to call back, advising of below for Jill Snow at [facility]

## 2017-04-29 ENCOUNTER — Other Ambulatory Visit: Payer: Self-pay | Admitting: Cardiovascular Disease

## 2017-05-03 ENCOUNTER — Ambulatory Visit
Admission: RE | Admit: 2017-05-03 | Discharge: 2017-05-03 | Disposition: A | Discharge status: Home / Self Care | Payer: Medicare PPO | Source: Ambulatory Visit | Attending: Otolaryngology | Admitting: Otolaryngology

## 2017-05-03 DIAGNOSIS — E041 Nontoxic single thyroid nodule: Secondary | ICD-10-CM

## 2017-05-03 NOTE — Procedures (Signed)
US guided FNA of left thyroid nodule.  7 FNAs obtained.  No immediate complication and minimal blood loss.

## 2017-05-05 ENCOUNTER — Telehealth: Payer: Self-pay | Admitting: *Deleted

## 2017-05-05 ENCOUNTER — Other Ambulatory Visit: Payer: Self-pay | Admitting: Otolaryngology

## 2017-05-05 DIAGNOSIS — E041 Nontoxic single thyroid nodule: Secondary | ICD-10-CM

## 2017-05-05 LAB — CYTOLOGY - NON PAP

## 2017-05-05 NOTE — Telephone Encounter (Signed)
Left message to notify

## 2017-05-05 NOTE — Telephone Encounter (Signed)
Patient would like ultra sound results.

## 2017-05-05 NOTE — Telephone Encounter (Signed)
Is she referring to the thyroid biopsy? If so she should contact ENT to determine what the next step would be and to see what the results are. Thanks.

## 2017-05-05 NOTE — Telephone Encounter (Signed)
Left message to return call 

## 2017-05-05 NOTE — Telephone Encounter (Signed)
Pt has requested any test results  Pt contact [phone]

## 2017-05-19 ENCOUNTER — Ambulatory Visit (INDEPENDENT_AMBULATORY_CARE_PROVIDER_SITE_OTHER): Payer: Medicare PPO | Admitting: Family Medicine

## 2017-05-19 ENCOUNTER — Ambulatory Visit (INDEPENDENT_AMBULATORY_CARE_PROVIDER_SITE_OTHER): Payer: Medicare PPO

## 2017-05-19 ENCOUNTER — Encounter: Payer: Self-pay | Admitting: Family Medicine

## 2017-05-19 ENCOUNTER — Other Ambulatory Visit: Payer: Self-pay | Admitting: Family Medicine

## 2017-05-19 VITALS — BP 124/70 | HR 62 | Temp 98.5°F | Wt 165.6 lb

## 2017-05-19 DIAGNOSIS — T148XXA Other injury of unspecified body region, initial encounter: Secondary | ICD-10-CM | POA: Insufficient documentation

## 2017-05-19 DIAGNOSIS — M5032 Other cervical disc degeneration, mid-cervical region, unspecified level: Secondary | ICD-10-CM | POA: Diagnosis not present

## 2017-05-19 DIAGNOSIS — M542 Cervicalgia: Secondary | ICD-10-CM

## 2017-05-19 DIAGNOSIS — J309 Allergic rhinitis, unspecified: Secondary | ICD-10-CM

## 2017-05-19 DIAGNOSIS — E559 Vitamin D deficiency, unspecified: Secondary | ICD-10-CM | POA: Diagnosis not present

## 2017-05-19 DIAGNOSIS — R911 Solitary pulmonary nodule: Secondary | ICD-10-CM | POA: Diagnosis not present

## 2017-05-19 DIAGNOSIS — Z8639 Personal history of other endocrine, nutritional and metabolic disease: Secondary | ICD-10-CM | POA: Diagnosis not present

## 2017-05-19 DIAGNOSIS — E041 Nontoxic single thyroid nodule: Secondary | ICD-10-CM | POA: Insufficient documentation

## 2017-05-19 HISTORY — DX: Nontoxic single thyroid nodule: E04.1

## 2017-05-19 LAB — CBC
HEMATOCRIT: 43.2 % (ref 36.0–46.0)
HEMOGLOBIN: 14.6 g/dL (ref 12.0–15.0)
MCHC: 33.8 g/dL (ref 30.0–36.0)
MCV: 95 fl (ref 78.0–100.0)
PLATELETS: 197 10*3/uL (ref 150.0–400.0)
RBC: 4.55 Mil/uL (ref 3.87–5.11)
RDW: 12.8 % (ref 11.5–15.5)
WBC: 5.4 10*3/uL (ref 4.0–10.5)

## 2017-05-19 LAB — PROTIME-INR
INR: 1.1 ratio — AB (ref 0.8–1.0)
Prothrombin Time: 11.9 s (ref 9.6–13.1)

## 2017-05-19 LAB — VITAMIN D 25 HYDROXY (VIT D DEFICIENCY, FRACTURES): VITD: 38.73 ng/mL (ref 30.00–100.00)

## 2017-05-19 LAB — VITAMIN B12: VITAMIN B 12: 372 pg/mL (ref 211–911)

## 2017-05-19 MED ORDER — FLUTICASONE PROPIONATE 50 MCG/ACT NA SUSP: 2.0000 | Freq: Every day | NASAL | 6 refills | Status: DC

## 2017-05-19 NOTE — Patient Instructions (Addendum)
Nice to see you. We'll get an x-ray and check lab work today. Will contact you with results. Please try Flonase for your upper respiratory symptoms.
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
## 2017-05-19 NOTE — Assessment & Plan Note (Signed)
Suspect rhinitis as cause of her throat congestion postnasal drip. We will trial of Flonase.

## 2017-05-19 NOTE — Assessment & Plan Note (Signed)
Continues to have issues with this. Given persistent issues will obtain an x-ray to evaluate further. Consider physical therapy once x-ray returns.
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
## 2017-05-19 NOTE — Assessment & Plan Note (Signed)
Almost due for follow-up. Scan has been ordered previously. We'll forward to Melissa to get scheduled.

## 2017-05-19 NOTE — Assessment & Plan Note (Signed)
Check vitamin D. 

## 2017-05-19 NOTE — Assessment & Plan Note (Signed)
Benign on recent biopsy.

## 2017-05-20 ENCOUNTER — Ambulatory Visit: Admit: 2017-05-20 | Payer: Medicare PPO | Admitting: Ophthalmology

## 2017-05-20 SURGERY — PHACOEMULSIFICATION, CATARACT, WITH IOL INSERTION
Anesthesia: Choice | Laterality: Left

## 2017-05-20 NOTE — Progress Notes (Signed)
She is scheduled for Thursday 10/11. I have lvm for her to rtc

## 2017-05-20 NOTE — Telephone Encounter (Signed)
lvm for pt to rtc for CT that is scheduled

## 2017-05-21 ENCOUNTER — Telehealth: Payer: Self-pay | Admitting: Radiology

## 2017-05-21 ENCOUNTER — Other Ambulatory Visit (INDEPENDENT_AMBULATORY_CARE_PROVIDER_SITE_OTHER): Payer: Medicare PPO

## 2017-05-21 ENCOUNTER — Other Ambulatory Visit: Payer: Self-pay | Admitting: Radiology

## 2017-05-21 DIAGNOSIS — Z5181 Encounter for therapeutic drug level monitoring: Secondary | ICD-10-CM

## 2017-05-21 DIAGNOSIS — Z7901 Long term (current) use of anticoagulants: Secondary | ICD-10-CM

## 2017-05-21 LAB — PROTIME-INR
INR: 1.1 ratio — AB (ref 0.8–1.0)
PROTHROMBIN TIME: 11.6 s (ref 9.6–13.1)

## 2017-05-21 NOTE — Telephone Encounter (Signed)
Disregard message. Orders have been placed by lab staff.

## 2017-05-21 NOTE — Telephone Encounter (Signed)
Pt coming in for labs today , please place future orders. Thank you.  

## 2017-05-21 NOTE — Addendum Note (Signed)
Addended by: Arby Barrette on: 05/21/2017 11:33 AM   Modules accepted: Orders

## 2017-05-21 NOTE — Telephone Encounter (Signed)
2nd message. PT came in today. Reviewed result note and drew pt for INR. Please place future orders. Thank you.

## 2017-05-22 ENCOUNTER — Other Ambulatory Visit: Payer: Self-pay | Admitting: Family Medicine

## 2017-05-22 DIAGNOSIS — M542 Cervicalgia: Secondary | ICD-10-CM

## 2017-05-27 ENCOUNTER — Ambulatory Visit: Admission: RE | Admit: 2017-05-27 | Payer: Medicare PPO | Source: Ambulatory Visit

## 2017-05-27 ENCOUNTER — Other Ambulatory Visit: Payer: Self-pay | Admitting: Family Medicine

## 2017-05-27 DIAGNOSIS — T148XXA Other injury of unspecified body region, initial encounter: Secondary | ICD-10-CM

## 2017-06-09 ENCOUNTER — Ambulatory Visit: Payer: Medicare PPO | Attending: Family Medicine | Admitting: Physical Therapy

## 2017-06-09 DIAGNOSIS — M6281 Muscle weakness (generalized): Secondary | ICD-10-CM | POA: Insufficient documentation

## 2017-06-09 DIAGNOSIS — M542 Cervicalgia: Secondary | ICD-10-CM
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
## 2017-06-09 NOTE — Patient Instructions (Signed)
Instructed pt in and she performed new HEP as follows:  Supine chin tucks with towel roll under neck 2x10; seated U/T and scalene stretches with hand anchored under chair (4 x 20 sec each bilaterally).  Pt instructed to use heat prior to stretches at home to relax musculature.

## 2017-06-10 ENCOUNTER — Other Ambulatory Visit (INDEPENDENT_AMBULATORY_CARE_PROVIDER_SITE_OTHER): Payer: Medicare PPO

## 2017-06-10 DIAGNOSIS — T148XXA Other injury of unspecified body region, initial encounter: Secondary | ICD-10-CM | POA: Diagnosis not present

## 2017-06-11 ENCOUNTER — Telehealth: Payer: Self-pay | Admitting: Family Medicine

## 2017-06-11 LAB — PROTIME-INR
INR: 1
PROTHROMBIN TIME: 10.7 s (ref 9.0–11.5)

## 2017-06-11 MED ORDER — IBUPROFEN 400 MG PO TABS: 400.0000 mg | ORAL_TABLET | Freq: Three times a day (TID) | ORAL | 0 refills | Status: DC | PRN

## 2017-06-11 NOTE — Telephone Encounter (Signed)
fyi

## 2017-06-11 NOTE — Telephone Encounter (Signed)
Short course and the pharmacy. She should take this with food. If she starts to have stomach discomfort with this or she has worsening symptoms or new symptoms such as numbness or weakness she should be evaluated.

## 2017-06-11 NOTE — Telephone Encounter (Signed)
PT called in at 3:32pm wanting to know if this would be filled. Told pt that Dr. Caryl Bis is currently seeing patients and has not be able to reply to issue.

## 2017-06-11 NOTE — Telephone Encounter (Signed)
Patient is aware 
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
## 2017-06-15 ENCOUNTER — Ambulatory Visit: Payer: Medicare PPO | Admitting: Physical Therapy

## 2017-06-15 DIAGNOSIS — M542 Cervicalgia: Secondary | ICD-10-CM

## 2017-06-15 DIAGNOSIS — M6281 Muscle weakness (generalized): Secondary | ICD-10-CM | POA: Diagnosis not present
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
## 2017-06-15 NOTE — Patient Instructions (Signed)
R cervical rotation to 33 degrees L rotation to 55 degrees   Cervical flexion chin to chest WNL ROM, but painful Extension limited 50% and painful   ER - not painful bilaterally  IR - not painful bilaterally   Abduction/flexion - both strong and non painful   Performed x 3 bouts grade I mobilizations from T7 through C2, focusing particular attention on regions where patient reported the most pain/symptoms. She noted pain around mid cervical region and more distal of the treated thoracic regions. Patient reported initially tender with joint mobilizations, which improved with repetitive oscillations. Performed 30-45" per bout. Patient reported significant improvement in pain symptoms after completion.   Performed 3 bouts of cervical manual traction x 30" per bout in grade I oscillations, patient reported initially painful, but reduced with repeated oscillations.   Soft tissue mobilization performed over cervical musculature including extensors and levator scapulae, patient reported initially tender, but decreased with prolonged exposure.   To finish 48 degrees for R rotation L rotation- 55-60 degrees

## 2017-06-18 ENCOUNTER — Ambulatory Visit: Payer: Medicare PPO

## 2017-06-18 ENCOUNTER — Ambulatory Visit: Payer: Medicare PPO | Admitting: Physical Therapy

## 2017-06-18 ENCOUNTER — Telehealth: Payer: Self-pay

## 2017-06-18 NOTE — Telephone Encounter (Signed)
Patient rescheduled flu shot for 11/5 at 9:05 am  Copied from [place] #[id number]. Topic: Quick Communication - Office Called Patient >> Jun 18, 2017  8:51 AM Bea Graff, NT wrote: Reason for CRM: Patient is returning a call she just missed from the office. Please call pt.

## 2017-06-21 ENCOUNTER — Ambulatory Visit (INDEPENDENT_AMBULATORY_CARE_PROVIDER_SITE_OTHER): Payer: Medicare PPO

## 2017-06-21 DIAGNOSIS — Z23 Encounter for immunization: Secondary | ICD-10-CM | POA: Diagnosis not present

## 2017-06-22 ENCOUNTER — Ambulatory Visit: Payer: Medicare PPO | Attending: Family Medicine | Admitting: Physical Therapy

## 2017-06-25 ENCOUNTER — Ambulatory Visit: Payer: Medicare PPO | Admitting: Physical Therapy

## 2017-07-15 ENCOUNTER — Telehealth: Payer: Self-pay | Admitting: Cardiovascular Disease

## 2017-07-15 NOTE — Telephone Encounter (Signed)
Spoke with patient and she states that she just feels so bad. Reviewed that most over the counter cold remedies are helpful just not the ones with decongestant which can cause fast heart rates. She also reports that she called her PCP and is pending a call back from them. She was very appreciative for the call back and had no further questions at this time.

## 2017-07-15 NOTE — Telephone Encounter (Signed)
Pt would like Dr. Rockey Situ to send her something to her pharmacy for her congestion. She states she has a call into her PCP, but he is out of the office today. I advised her I wasn't sure if Dr. Rockey Situ would call in something for this problem. Please call and advise.

## 2017-08-11 ENCOUNTER — Ambulatory Visit: Payer: Medicare PPO

## 2017-08-18 ENCOUNTER — Ambulatory Visit: Payer: Medicare PPO

## 2017-08-31 ENCOUNTER — Ambulatory Visit: Payer: Medicare PPO | Admitting: Family Medicine

## 2017-09-22 ENCOUNTER — Telehealth: Payer: Self-pay | Admitting: Cardiovascular Disease

## 2017-09-22 NOTE — Telephone Encounter (Signed)
Received request for orders via fax for patients CPAP machine and supplies. Advised that patient would need to have this done by PCP or pulmonary physician. She verbalized understanding with no further questions.

## 2017-09-23 ENCOUNTER — Telehealth: Payer: Self-pay | Admitting: *Deleted

## 2017-09-23 NOTE — Telephone Encounter (Signed)
Patient has not been seen in office since 07/30/15 for OSA. Patient needs a follow up appt around 07/29/16 which did not take place. Before any orders can be placed patient needs an office follow up. Nothing further needed.

## 2017-09-23 NOTE — Telephone Encounter (Signed)
Patient has scheduled appt 2/14 with DR. Nothing further needed

## 2017-09-23 NOTE — Telephone Encounter (Signed)
Message left for patient to schedule appointment in order to get new cpap supplies.
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
## 2017-09-30 ENCOUNTER — Encounter: Payer: Self-pay | Admitting: Internal Medicine

## 2017-09-30 ENCOUNTER — Ambulatory Visit (INDEPENDENT_AMBULATORY_CARE_PROVIDER_SITE_OTHER): Payer: Medicare PPO | Admitting: Internal Medicine

## 2017-09-30 ENCOUNTER — Other Ambulatory Visit: Payer: Self-pay | Admitting: Family Medicine

## 2017-09-30 VITALS — BP 124/78 | HR 68 | Ht 67.0 in | Wt 170.0 lb

## 2017-09-30 DIAGNOSIS — G4733 Obstructive sleep apnea (adult) (pediatric): Secondary | ICD-10-CM

## 2017-09-30 DIAGNOSIS — J309 Allergic rhinitis, unspecified: Secondary | ICD-10-CM | POA: Diagnosis not present

## 2017-09-30 NOTE — Patient Instructions (Signed)
--

## 2017-10-06 NOTE — Telephone Encounter (Signed)
Okay to refill? Last seen in October & cancelled appt in January for F/U & AWV.

## 2017-10-07 NOTE — Telephone Encounter (Signed)
Sent to pharmacy.  Please additionally check with [facility] to see if we can get the patient's chest CT scheduled.  She is due for follow-up and this was previously ordered.  Thanks.

## 2017-10-08 NOTE — Telephone Encounter (Signed)
Spoke with Jill Snow she states she will schedule

## 2017-10-19 ENCOUNTER — Telehealth: Payer: Self-pay | Admitting: Internal Medicine

## 2017-10-19 DIAGNOSIS — G4733 Obstructive sleep apnea (adult) (pediatric): Secondary | ICD-10-CM

## 2017-10-19 NOTE — Telephone Encounter (Signed)
Per [name] at [facility] will cover some in lab studies. Pt has heart issues.  Pt is very nervous about doing a HST and prefers in lab study.  [name] with [facility] feels that she could get approval for in lab study based on pt's history. Jill Snow

## 2017-10-19 NOTE — Telephone Encounter (Signed)
Pls advise patient that insurance will only cover HST as she does not meet her insurance's requirements for in-lab. I would be happy to order an in-lab but she will have to pay out of pocket.

## 2017-10-19 NOTE — Telephone Encounter (Signed)
Split night ordered. Patient preference. Nothing further needed.

## 2017-10-19 NOTE — Telephone Encounter (Signed)
Contacted patient to arrange HST. Pt stated that she didn't want to do HST, that she wanted to do an in lab study instead.   Please advise.  Rhonda J Cobb

## 2017-10-21 ENCOUNTER — Ambulatory Visit: Payer: Medicare PPO | Attending: Family Medicine

## 2017-11-02 ENCOUNTER — Ambulatory Visit: Payer: Medicare PPO | Attending: Internal Medicine

## 2017-11-02 DIAGNOSIS — G4761 Periodic limb movement disorder: Secondary | ICD-10-CM | POA: Insufficient documentation

## 2017-11-02 DIAGNOSIS — G4733 Obstructive sleep apnea (adult) (pediatric): Secondary | ICD-10-CM | POA: Diagnosis not present

## 2017-11-10 DIAGNOSIS — G4733 Obstructive sleep apnea (adult) (pediatric): Secondary | ICD-10-CM | POA: Diagnosis not present

## 2017-11-12 ENCOUNTER — Telehealth: Payer: Self-pay | Admitting: *Deleted

## 2017-11-12 DIAGNOSIS — G4733 Obstructive sleep apnea (adult) (pediatric): Secondary | ICD-10-CM

## 2017-11-12 NOTE — Telephone Encounter (Signed)
Contacted patient with results of sleep study. Orders placed Nothing further needed

## 2017-12-02 DIAGNOSIS — H25813 Combined forms of age-related cataract, bilateral: Secondary | ICD-10-CM | POA: Diagnosis not present

## 2018-01-25 ENCOUNTER — Other Ambulatory Visit: Payer: Self-pay | Admitting: Cardiovascular Disease

## 2018-01-25 ENCOUNTER — Other Ambulatory Visit: Payer: Self-pay | Admitting: Family Medicine

## 2018-01-25 DIAGNOSIS — J309 Allergic rhinitis, unspecified: Secondary | ICD-10-CM

## 2018-01-25 NOTE — Telephone Encounter (Signed)
Please advise if ok to refill not cardiac medication. 

## 2018-01-25 NOTE — Telephone Encounter (Signed)
Last OV 05/19/17 last filled Ibuprofen 10/07/17 30 0rf Montelukast 12/17/16 30 5rf

## 2018-01-25 NOTE — Telephone Encounter (Signed)
Pam,   What do you think he would like to do with this- it looks like he addressed this back in July.

## 2018-01-25 NOTE — Telephone Encounter (Signed)
Please find out what she needs the ibuprofen for.  Please confirm that she is not on any anticoagulation.

## 2018-01-26 ENCOUNTER — Other Ambulatory Visit: Payer: Self-pay | Admitting: Family Medicine

## 2018-01-26 NOTE — Telephone Encounter (Signed)
FYI

## 2018-01-26 NOTE — Telephone Encounter (Signed)
Left message to return call, ok for pec to speak to patient and ask what she takes ibuprofen for and if she is on an anticoagulant medication.
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
## 2018-01-27 ENCOUNTER — Telehealth: Payer: Self-pay

## 2018-01-27 MED ORDER — FLUTICASONE PROPIONATE 50 MCG/ACT NA SUSP: 2.0000 | Freq: Every day | NASAL | 6 refills | Status: DC

## 2018-01-27 NOTE — Telephone Encounter (Signed)
fyi

## 2018-01-27 NOTE — Telephone Encounter (Signed)
Left message to return call, ok for pec to speak to patient and ask her if she is still taking eliquis
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
## 2018-01-27 NOTE — Telephone Encounter (Signed)
Copied from [place] [id number]. Topic: Quick Communication - See Telephone Encounter >> Jan 26, 2018  2:26 PM Juanda Chance, CMA wrote: CRM for notification. See Telephone encounter for: 01/26/18. Left message to return call, ok for pec to speak to patient and ask what she takes ibuprofen for and if she is on an anticoagulant medication. >> Jan 27, 2018 12:55 PM Wynetta Emery, [place] C wrote: Pt returned call. Pt says that she has taken IB before for an headache but she hasn't had to take any in a while. Pt says to her knowledge she is not taking any anticoagulant medications.

## 2018-01-27 NOTE — Telephone Encounter (Signed)
Noted.  It does not appear that she is on an anticoagulant.  She was previously taking Eliquis for short period of time.  Please confirm that she is no longer taking that.  Thanks.

## 2018-01-27 NOTE — Addendum Note (Signed)
Addended by: Leone Haven on: 01/27/2018 08:35 AM   Modules accepted: Orders

## 2018-02-02 NOTE — Progress Notes (Deleted)
* [facility]     Assessment and Plan:  Obstructive sleep apnea on CPAP --AHI of 20 9 previous sleep study.  Her CPAP machine has broken, and she is having recurrent symptoms of excessive daytime sleepiness likely secondary to untreated obstructive sleep apnea. -We will send for new sleep study as per insurance requirements in order to requalify her for CPAP.  If she is not able to use CPAP, she may benefit from referral to a dentist for dental device.  Atrial fibrillation --May be exacerbated by OSA, it is important for her to stay on CPAP.    Date: 02/02/2018  MRN# [id number] NICEY KRAH 09/13/1933   DEYRA PERDOMO is a 84 y.o. old female seen in follow up for chief complaint of  No chief complaint on file.    HPI:   The patient has a history of obstructive sleep apnea with AHI of 23, as well as atrial fibrillation, diastolic congestive heart failure. At last visit her PAP was no longer working, she was sent for a new sleep study and started on PAP again.    **CPAP titration study 05/16/15, recommended to be on CPAP of 10. **sleep study on 12/26/2010 shows the following, the patient was sent for a split-night study at that time, her AHI was noted to be 35 with mild snoring noted throughout the study. She was titrated on CPAP to an ideal pressure of 9. **Echo 03/21/2015: Ejection fraction was 60-65% echo 2014: EF 40-45%, mildly dilated RV, mod reduced RV systolic fxn, mod dilated LA, Mild to mod MR, mod TR, mildly elevated PASP   Medication:   Outpatient Encounter Medications as of 02/09/2018  Medication Sig  . amiodarone (PACERONE) 200 MG tablet Take 1 tablet (200 mg total) by mouth daily.  . fluticasone (FLONASE) 50 MCG/ACT nasal spray Place 2 sprays into both nostrils daily.  . IBU 400 MG tablet TAKE ONE TABLET BY MOUTH EVERY 8 HOURS AS NEEDED FOR MILD PAIN  . montelukast (SINGULAIR) 10 MG tablet TAKE ONE TABLET EVERY DAY  . simvastatin (ZOCOR)  40 MG tablet Take 1 tablet (40 mg total) by mouth daily at 6 PM.  . Vitamin D, Ergocalciferol, (DRISDOL) 50000 units CAPS capsule TAKE 1 CAPSULE BY MOUTH EVERY 7 DAYS   No facility-administered encounter medications on file as of 02/09/2018.      Allergies:  Codeine; Augmentin [amoxicillin-pot clavulanate]; and Crestor [rosuvastatin]  Review of Systems:   Physical Examination:   VS: There were no vitals taken for this visit.     LABORATORY PANEL:   CBC No results for input(s): WBC, HGB, HCT, PLT in the last 168 hours. ------------------------------------------------------------------------------------------------------------------  Chemistries  No results for input(s): NA, K, CL, CO2, GLUCOSE, BUN, CREATININE, CALCIUM, MG, AST, ALT, ALKPHOS, BILITOT in the last 168 hours.  Invalid input(s): GFRCGP ------------------------------------------------------------------------------------------------------------------  Cardiac Enzymes No results for input(s): TROPONINI in the last 168 hours. ------------------------------------------------------------  RADIOLOGY:   No results found for this or any previous visit. Results for orders placed in visit on 08/20/99  DG Chest 2 View   Narrative FINDINGS IMPRESSION 1.  QUESTIONABLE 7 MM NODULE AT THE LEFT BASE WITH OTHERWISE UNREMARKABLE CHEST AND I BELIEVE THE CHEST IS SATISFACTORY FOR THE PROPOSED SURGICAL PROCEDURE. 2.  THE PATIENT HAS HAD PREVIOUS CHEST FILMS AT [facility] WILL ATTEMPT TO OBTAIN AND ADD AN ADDENDUM TO THIS REPORT.   IF PRIOR FILMS ARE NOT AVAILABLE RECOMMEND LIMITED CT OF THE CHEST. ADDENDUM THE  PATIENT WAS ABLE TO OBTAIN COPIES OF CHEST FILMS PERFORMED AT [facility] ON 01/26/96 AND THERE IS NO DEFINITE NODULE AT THE LEFT BASE.  I WOULD RECOMMEND A LIMITED CT OF THE CHEST.    ------------------------------------------------------------------------------------------------------------------  Thank  you for allowing [facility] to assist in the care of your patient. Our recommendations are noted above.  Please contact us if we can be of further service.   Marda Stalker, MD.  [facility] Office Number: [phone]

## 2018-02-03 NOTE — Telephone Encounter (Signed)
Noted  

## 2018-02-03 NOTE — Telephone Encounter (Signed)
Patient states she is not taking eliquis

## 2018-02-09 ENCOUNTER — Ambulatory Visit: Payer: Medicare PPO | Admitting: Internal Medicine

## 2018-02-10 ENCOUNTER — Encounter: Payer: Self-pay | Admitting: Internal Medicine

## 2018-02-18 ENCOUNTER — Telehealth: Payer: Self-pay | Admitting: Family Medicine

## 2018-02-18 DIAGNOSIS — E559 Vitamin D deficiency, unspecified: Secondary | ICD-10-CM

## 2018-02-21 ENCOUNTER — Other Ambulatory Visit: Payer: Self-pay | Admitting: Family Medicine

## 2018-02-21 DIAGNOSIS — J309 Allergic rhinitis, unspecified: Secondary | ICD-10-CM

## 2018-02-22 NOTE — Telephone Encounter (Signed)
Last Ov 05/19/17 last filled 04/29/17 8 0rf

## 2018-02-22 NOTE — Telephone Encounter (Signed)
She should not need a refill on this.  This was for an 8-week course and it would need to be rechecked prior to filling this medication.

## 2018-02-23 ENCOUNTER — Ambulatory Visit: Payer: 59

## 2018-02-23 NOTE — Telephone Encounter (Signed)
Patient called, left VM to return call to the office to discuss the Vit D refill request.

## 2018-02-23 NOTE — Telephone Encounter (Signed)
Left message to return call, ok for pec to speak to patient about message below regarding vitamin D refill, ok to schedule lab appointment for vitamin D lab

## 2018-02-24 NOTE — Telephone Encounter (Signed)
Pt calling and advised of Dr. Tharon Aquas message. Pt states that she will call back after her eye appointment on Monday to schedule and appointment for the lab.

## 2018-02-24 NOTE — Telephone Encounter (Signed)
Please place order for vitamin D

## 2018-02-24 NOTE — Addendum Note (Signed)
Addended by: Leone Haven on: 02/24/2018 03:24 PM   Modules accepted: Orders

## 2018-02-24 NOTE — Telephone Encounter (Signed)
Patient called, left VM to return call to the office in reference to a refill request.

## 2018-02-24 NOTE — Telephone Encounter (Signed)
Ordered

## 2018-03-08 ENCOUNTER — Ambulatory Visit: Payer: Medicare PPO | Admitting: Cardiovascular Disease
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
## 2018-04-06 ENCOUNTER — Encounter: Payer: Self-pay | Admitting: Cardiovascular Disease

## 2018-04-06 ENCOUNTER — Ambulatory Visit: Payer: Medicare PPO | Admitting: Cardiovascular Disease

## 2018-04-06 VITALS — BP 110/72 | HR 86 | Ht 66.5 in | Wt 173.0 lb

## 2018-04-06 DIAGNOSIS — I4892 Unspecified atrial flutter: Secondary | ICD-10-CM | POA: Diagnosis not present

## 2018-04-06 DIAGNOSIS — I700 Atherosclerosis of aorta: Secondary | ICD-10-CM | POA: Insufficient documentation

## 2018-04-06 DIAGNOSIS — G4733 Obstructive sleep apnea (adult) (pediatric): Secondary | ICD-10-CM | POA: Diagnosis not present

## 2018-04-06 DIAGNOSIS — I480 Paroxysmal atrial fibrillation: Secondary | ICD-10-CM | POA: Diagnosis not present

## 2018-04-06 DIAGNOSIS — Z9989 Dependence on other enabling machines and devices: Secondary | ICD-10-CM | POA: Diagnosis not present

## 2018-04-06 DIAGNOSIS — E7800 Pure hypercholesterolemia, unspecified: Secondary | ICD-10-CM

## 2018-04-06 DIAGNOSIS — I10 Essential (primary) hypertension: Secondary | ICD-10-CM

## 2018-04-06 MED ORDER — VITAMIN B-12 100 MCG PO TABS: 100.0000 ug | ORAL_TABLET | Freq: Every day | ORAL | 0 refills | Status: DC

## 2018-04-06 MED ORDER — SIMVASTATIN 40 MG PO TABS: 40.0000 mg | ORAL_TABLET | Freq: Every day | ORAL | 4 refills | Status: DC

## 2018-04-06 NOTE — Patient Instructions (Addendum)
Medication Instructions:   Restart simvastatin one a day for cholesterol  Add B12 and Vit D  Labwork:  No new labs needed  Testing/Procedures:  No further testing at this time   Follow-Up: It was a pleasure seeing you in the office today. Please call us if you have new issues that need to be addressed before your next appt.  [phone]  Your physician wants you to follow-up in: 12 months.  You will receive a reminder letter in the mail two months in advance. If you don't receive a letter, please call our office to schedule the follow-up appointment.  If you need a refill on your cardiac medications before your next appointment, please call your pharmacy.  For educational health videos Log in to : [email or web address] Or : [email or web address], password : [email or web address]

## 2018-04-25 ENCOUNTER — Ambulatory Visit: Payer: Medicare PPO

## 2018-04-30 DIAGNOSIS — Z6821 Body mass index (BMI) 21.0-21.9, adult: Secondary | ICD-10-CM | POA: Diagnosis not present

## 2018-04-30 DIAGNOSIS — E785 Hyperlipidemia, unspecified: Secondary | ICD-10-CM | POA: Diagnosis not present

## 2018-04-30 DIAGNOSIS — M158 Other polyosteoarthritis: Secondary | ICD-10-CM | POA: Diagnosis not present

## 2018-04-30 DIAGNOSIS — I480 Paroxysmal atrial fibrillation: Secondary | ICD-10-CM | POA: Diagnosis not present

## 2018-05-13 DIAGNOSIS — G4733 Obstructive sleep apnea (adult) (pediatric): Secondary | ICD-10-CM | POA: Diagnosis not present

## 2018-05-16 ENCOUNTER — Ambulatory Visit: Payer: Medicare PPO | Admitting: Family Medicine

## 2018-05-16 ENCOUNTER — Ambulatory Visit (INDEPENDENT_AMBULATORY_CARE_PROVIDER_SITE_OTHER): Payer: Medicare PPO

## 2018-05-16 ENCOUNTER — Other Ambulatory Visit: Payer: Self-pay

## 2018-05-16 ENCOUNTER — Encounter: Payer: Self-pay | Admitting: Family Medicine

## 2018-05-16 ENCOUNTER — Ambulatory Visit
Admission: RE | Admit: 2018-05-16 | Discharge: 2018-05-16 | Disposition: A | Discharge status: Home / Self Care | Payer: Medicare PPO | Source: Ambulatory Visit | Attending: Family Medicine | Admitting: Family Medicine

## 2018-05-16 ENCOUNTER — Emergency Department
Admission: EM | Admit: 2018-05-16 | Discharge: 2018-05-16 | Disposition: A | Discharge status: Home / Self Care | Payer: Medicare PPO | Attending: Emergency Medicine | Admitting: Emergency Medicine

## 2018-05-16 ENCOUNTER — Telehealth: Payer: Self-pay

## 2018-05-16 VITALS — BP 118/84 | HR 88 | Temp 98.5°F | Ht 66.5 in | Wt 168.6 lb

## 2018-05-16 VITALS — BP 118/84 | Temp 98.5°F | Ht 66.5 in | Wt 168.6 lb

## 2018-05-16 DIAGNOSIS — M79604 Pain in right leg: Secondary | ICD-10-CM

## 2018-05-16 DIAGNOSIS — N1339 Other hydronephrosis: Secondary | ICD-10-CM | POA: Diagnosis not present

## 2018-05-16 DIAGNOSIS — E7800 Pure hypercholesterolemia, unspecified: Secondary | ICD-10-CM | POA: Diagnosis not present

## 2018-05-16 DIAGNOSIS — Z5321 Procedure and treatment not carried out due to patient leaving prior to being seen by health care provider: Secondary | ICD-10-CM | POA: Diagnosis not present

## 2018-05-16 DIAGNOSIS — R911 Solitary pulmonary nodule: Secondary | ICD-10-CM

## 2018-05-16 DIAGNOSIS — E559 Vitamin D deficiency, unspecified: Secondary | ICD-10-CM

## 2018-05-16 DIAGNOSIS — J40 Bronchitis, not specified as acute or chronic: Secondary | ICD-10-CM | POA: Insufficient documentation

## 2018-05-16 DIAGNOSIS — Z0000 Encounter for general adult medical examination without abnormal findings: Secondary | ICD-10-CM | POA: Diagnosis not present

## 2018-05-16 DIAGNOSIS — M79661 Pain in right lower leg: Secondary | ICD-10-CM | POA: Diagnosis not present

## 2018-05-16 LAB — COMPREHENSIVE METABOLIC PANEL
ALK PHOS: 60 U/L (ref 39–117)
ALT: 16 U/L (ref 0–35)
AST: 26 U/L (ref 0–37)
Albumin: 4.4 g/dL (ref 3.5–5.2)
BILIRUBIN TOTAL: 1.3 mg/dL — AB (ref 0.2–1.2)
BUN: 8 mg/dL (ref 6–23)
CALCIUM: 10.1 mg/dL (ref 8.4–10.5)
CO2: 28 meq/L (ref 19–32)
CREATININE: 0.8 mg/dL (ref 0.40–1.20)
Chloride: 104 mEq/L (ref 96–112)
GFR: 72.9 mL/min (ref >60.00)
Glucose, Bld: 98 mg/dL (ref 70–99)
Potassium: 4.2 mEq/L (ref 3.5–5.1)
Sodium: 140 mEq/L (ref 135–145)
TOTAL PROTEIN: 7.6 g/dL (ref 6.0–8.3)

## 2018-05-16 LAB — LDL CHOLESTEROL, DIRECT: Direct LDL: 187 mg/dL

## 2018-05-16 LAB — LIPID PANEL
CHOL/HDL RATIO: 8
CHOLESTEROL: 266 mg/dL — AB (ref 0–200)
HDL: 34.4 mg/dL — ABNORMAL LOW (ref >39.00)
NONHDL: 231.39
Triglycerides: 271 mg/dL — ABNORMAL HIGH (ref 0.0–149.0)
VLDL: 54.2 mg/dL — ABNORMAL HIGH (ref 0.0–40.0)

## 2018-05-16 LAB — VITAMIN D 25 HYDROXY (VIT D DEFICIENCY, FRACTURES): VITD: 36.1 ng/mL (ref 30.00–100.00)

## 2018-05-16 MED ORDER — FLUTICASONE PROPIONATE 50 MCG/ACT NA SUSP: 2.0000 | Freq: Every day | NASAL | 6 refills | Status: DC

## 2018-05-16 MED ORDER — DOXYCYCLINE HYCLATE 100 MG PO TABS: 100.0000 mg | ORAL_TABLET | Freq: Two times a day (BID) | ORAL | 0 refills | Status: DC

## 2018-05-16 MED ORDER — SIMVASTATIN 40 MG PO TABS: 40.0000 mg | ORAL_TABLET | Freq: Every day | ORAL | 4 refills | Status: DC

## 2018-05-16 NOTE — Patient Instructions (Signed)
Nice to see you. We are going to get an ultrasound of your leg to ensure there is no blood clot. We are going to get your CT scan set up to follow-up on the lung nodule. We are going to get you to see urology. We are going to treat your likely bronchitis with doxycycline. I have refilled your medications. We will get lab work today and contact you with the results.
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
## 2018-05-16 NOTE — Telephone Encounter (Signed)
Copied from [place] [id number]. Topic: General - Other >> May 16, 2018  2:08 PM Yvette Rack wrote: Reason for CRM: Lilia Pro from [facility] [place] [facility] calling about normal [facility] VENOUS IMAG BI/LT/RT  advised pt that Karmina Zufall would call pt about [facility]

## 2018-05-16 NOTE — Assessment & Plan Note (Signed)
Check lipid panel.  Continue simvastatin. 

## 2018-05-16 NOTE — Assessment & Plan Note (Signed)
Check vitamin D. 

## 2018-05-16 NOTE — Assessment & Plan Note (Signed)
Noted on prior imaging.  She never saw urology.  Referral placed.
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
## 2018-05-16 NOTE — Assessment & Plan Note (Signed)
CT scan reordered.  Discussed the need to have this done.

## 2018-05-16 NOTE — Progress Notes (Signed)
I have reviewed the above note and agree.  Traci Plemons, M.D.  

## 2018-05-16 NOTE — Assessment & Plan Note (Signed)
Symptoms concerning for bronchitis.  Will treat with doxycycline.  If not improving she will follow-up.

## 2018-05-16 NOTE — Assessment & Plan Note (Signed)
Undetermined cause.  Could be muscular.  One concern would be for DVT given that she has not been very active recently.  We will obtain an ultrasound to rule out DVT.  If negative consider x-ray or referral to orthopedics.

## 2018-05-16 NOTE — Progress Notes (Signed)
Subjective:   Jill Snow is a 84 y.o. female who presents for Medicare Annual (Subsequent) preventive examination.  Review of Systems:  No ROS.  Medicare Wellness Visit. Additional risk factors are reflected in the social history.  Cardiac Risk Factors include: advanced age (>62men, >46 women)     Objective:     Vitals: BP 118/84 (BP Location: Left Arm, Patient Position: Sitting, Cuff Size: Normal)   Temp 98.5 F (36.9 C) (Oral)   Ht 5' 6.5" (1.689 m)   Wt 168 lb 9.6 oz (76.5 kg)   SpO2 98%   BMI 26.81 kg/m   Body mass index is 26.81 kg/m.  Advanced Directives 05/16/2018 05/16/2018 06/09/2017 11/30/2016 10/27/2016 03/30/2015  Does Patient Have a Medical Advance Directive? No No Yes No No Yes  Type of Advance Directive - - - - - Press photographer;Living will  Would patient like information on creating a medical advance directive? No - Patient declined Yes (MAU/Ambulatory/Procedural Areas - Information given) - No - Patient declined Yes (ED - Information included in AVS) -    Tobacco Social History   Tobacco Use  Smoking Status Never Smoker  Smokeless Tobacco Never Used     Counseling given: Not Answered   Clinical Intake:  Pre-visit preparation completed: Yes        Nutritional Status: BMI 25 -29 Overweight Diabetes: No  How often do you need to have someone help you when you read instructions, pamphlets, or other written materials from your doctor or pharmacy?: 1 - Never  Interpreter Needed?: No     Past Medical History:  Diagnosis Date  . Atrial fibrillation ([place])   . Atrial flutter ([place])    a. s/p successful TEE/DCCV in 2014; b. TEE 2014 showed EF 45-50%, mild bi-atrial enlargement, mod MR  . Dyspnea   . History of chicken pox   . Hypercholesterolemia   . Hypertension   . Mitral regurgitation    a. echo 2014: EF 40-45%, mildly dilated RV, mod reduced RV systolic fxn, mod dilated LA, Mild to mod MR, mod TR, mildly elevated PASP  .  PAF (paroxysmal atrial fibrillation) ([place])    a. initial episode 2011; b. not on long term anticoagulation since 06/2013  . RBBB (right bundle branch block)   . Seasonal allergies   . Sleep apnea    a. on CPAP   Past Surgical History:  Procedure Laterality Date  . ABDOMINAL HYSTERECTOMY    . TONSILLECTOMY AND ADENOIDECTOMY  1947  . tummy tuck     Family History  Problem Relation Age of Onset  . Stroke Mother   . Heart attack Mother   . Arthritis Mother   . Hyperlipidemia Mother   . Heart disease Mother   . Diabetes Mother   . Diabetes Sister   . Cancer Daughter   . Heart disease Daughter   . Diabetes Daughter   . Diabetes Brother   . Diabetes Son   . Diabetes Maternal Grandmother   . Diabetes Maternal Grandfather    Social History   Socioeconomic History  . Marital status: Widowed    Spouse name: Not on file  . Number of children: 2  . Years of education: Not on file  . Highest education level: Not on file  Occupational History  . Occupation: REALTOR    Employer: [employer]  Social Needs  . Financial resource strain: Not hard at all  . Food insecurity:    Worry: Never true  Inability: Never true  . Transportation needs:    Medical: No    Non-medical: No  Tobacco Use  . Smoking status: Never Smoker  . Smokeless tobacco: Never Used  Substance and Sexual Activity  . Alcohol use: No  . Drug use: No  . Sexual activity: Never  Lifestyle  . Physical activity:    Days per week: 0 days    Minutes per session: Not on file  . Stress: Not at all  Relationships  . Social connections:    Talks on phone: Not on file    Gets together: Not on file    Attends religious service: Not on file    Active member of club or organization: Not on file    Attends meetings of clubs or organizations: Not on file    Relationship status: Not on file  Other Topics Concern  . Not on file  Social History Narrative  . Not on file    Outpatient Encounter Medications as of  05/16/2018  Medication Sig  . doxycycline (VIBRA-TABS) 100 MG tablet Take 1 tablet (100 mg total) by mouth 2 (two) times daily.  . fluticasone (FLONASE) 50 MCG/ACT nasal spray Place 2 sprays into both nostrils daily.  . IBU 400 MG tablet TAKE ONE TABLET BY MOUTH EVERY 8 HOURS AS NEEDED FOR MILD PAIN (Patient not taking: Reported on 05/16/2018)  . montelukast (SINGULAIR) 10 MG tablet TAKE ONE TABLET EVERY DAY (Patient not taking: Reported on 05/16/2018)  . simvastatin (ZOCOR) 40 MG tablet Take 1 tablet (40 mg total) by mouth daily at 6 PM.  . vitamin B-12 (CYANOCOBALAMIN) 100 MCG tablet Take 1 tablet (100 mcg total) by mouth daily. Over the counter. (Patient not taking: Reported on 05/16/2018)  . Vitamin D, Ergocalciferol, (DRISDOL) 50000 units CAPS capsule TAKE 1 CAPSULE BY MOUTH EVERY 7 DAYS (Patient not taking: Reported on 05/16/2018)  . [DISCONTINUED] fluticasone (FLONASE) 50 MCG/ACT nasal spray Place 2 sprays into both nostrils daily. (Patient not taking: Reported on 05/16/2018)  . [DISCONTINUED] simvastatin (ZOCOR) 40 MG tablet Take 1 tablet (40 mg total) by mouth daily at 6 PM. (Patient not taking: Reported on 05/16/2018)   No facility-administered encounter medications on file as of 05/16/2018.     Activities of Daily Living In your present state of health, do you have any difficulty performing the following activities: 05/16/2018  Hearing? N  Vision? N  Difficulty concentrating or making decisions? N  Walking or climbing stairs? N  Dressing or bathing? N  Doing errands, shopping? N  Preparing Food and eating ? N  Using the Toilet? N  In the past six months, have you accidently leaked urine? N  Do you have problems with loss of bowel control? N  Managing your Medications? N  Managing your Finances? N  Housekeeping or managing your Housekeeping? N  Some recent data might be hidden    Patient Care Team: Leone Haven, MD as PCP - General (Family Medicine) Minna Merritts, MD as  Consulting Physician (Cardiology)    Assessment:   This is a routine wellness examination for Ilyanna.  The goal of the wellness visit is to assist the patient how to close the gaps in care and create a preventative care plan for the patient.   She has been seen by her primary care physician today and plans to follow up with all appointments as directed.   The roster of all physicians providing medical care to patient is listed in the Snapshot section  of the chart.  Taking calcium VIT D as appropriate/Osteoporosis risk reviewed.    Safety issues reviewed; She lives with her son. Smoke and carbon monoxide detectors in the home. No firearms in the home. Wears seatbelts when driving or riding with others. No violence in the home.  They do not have excessive sun exposure.  Discussed the need for sun protection: hats, long sleeves and the use of sunscreen if there is significant sun exposure.  She reports no failures at ADL's or IADL's.    Incorrectly answered the current year, month.  Difficulty understanding instructions to say the months of the year in reverse; repeated instructions 3x.   BMI- discussed the importance of a healthy diet, water intake and the benefits of aerobic exercise.She plans to make healthier, more balanced food choices, stay hydrated and exercise at the gym 3 days weekly for 30 minutes. Educational material provided.   24 hour diet recall: Notes her appetite has not been as good lately and she has been eating mostly peanut butter sandwiches.  Nutritional information for health and wellness provided.   Dental- she has just received dental coverage and plans to schedule an exam.   Eye- Appointment scheduled with [facility] in [place] for the removal of cataract.   Sleep patterns- Sleeps fair without the use of her CPAP.  Awaiting accessories.   Dexa Scan deferred per patient request.   Exercise Activities and Dietary recommendations    Goals        Patient Stated   . Increase physical activity (pt-stated)     Start [facility] days weekly, 30 minutes Brain engagement exercises (reading, crossword puzzles etc...)       Fall Risk Fall Risk  05/16/2018 12/10/2016 08/13/2015 07/08/2015 01/07/2015  Falls in the past year? No No Yes No No  Number falls in past yr: - - 1 - -  Injury with Fall? - - No - -   Depression Screen PHQ 2/9 Scores 05/16/2018 12/10/2016 08/13/2015 07/08/2015  PHQ - 2 Score 0 0 0 0  PHQ- 9 Score - 0 - -     Cognitive Function     6CIT Screen 05/16/2018  What Year? 4 points  What month? 3 points  What time? 0 points  Count back from 20 0 points  Months in reverse 4 points  Repeat phrase 2 points  Total Score 13    Immunization History  Administered Date(s) Administered  . Hep A / Hep B 04/22/2016, 05/27/2016  . Influenza Split 05/31/2013  . Influenza, High Dose Seasonal PF 06/21/2017  . Influenza,inj,Quad PF,6+ Mos 05/15/2014, 06/14/2015, 04/22/2016  . Pneumococcal Conjugate-13 06/14/2015  . Pneumococcal Polysaccharide-23 07/18/2013  . Tdap 01/02/2014   Screening Tests Health Maintenance  Topic Date Due  . DEXA SCAN  11/16/2000  . INFLUENZA VACCINE  06/23/2018 (Originally 03/17/2018)  . TETANUS/TDAP  01/03/2024  . PNA vac Low Risk Adult  Completed      Plan:    End of life planning; Advance aging; Advanced directives discussed. Copy of current HCPOA/Living Will requested upon completion.    Follow up with all appointments as directed by your doctor today.   I have personally reviewed and noted the following in the patient's chart:   . Medical and social history . Use of alcohol, tobacco or illicit drugs  . Current medications and supplements . Functional ability and status . Nutritional status . Physical activity . Advanced directives . List of other physicians .  Hospitalizations, surgeries, and ER visits in previous 12 months . Vitals . Screenings to  include cognitive, depression, and falls . Referrals and appointments  In addition, I have reviewed and discussed with patient certain preventive protocols, quality metrics, and best practice recommendations. A written personalized care plan for preventive services as well as general preventive health recommendations were provided to patient.     Varney Biles, LPN  4/80/1655

## 2018-05-16 NOTE — ED Triage Notes (Signed)
Pt c/o swelling and pain to the right leg for over a week and was seen by PCP today and referred to the ED.Marland Kitchen

## 2018-05-16 NOTE — Telephone Encounter (Signed)
Called and spoke with pt to advise that her DTV came back normal no blood clots. Sent to PCP as an [place]. Pt also wanted to know since this came back normal what could possibly be causing her leg pain?

## 2018-05-16 NOTE — Patient Instructions (Addendum)
  Jill Snow , Thank you for taking time to come for your Medicare Wellness Visit. I appreciate your ongoing commitment to your health goals. Please review the following plan we discussed and let me know if I can assist you in the future.   Follow up as needed and directed.   Bring a copy of your Darling and/or Living Will to be scanned into chart upon completion.   Have a great day!  These are the goals we discussed: Goals      Patient Stated   . Increase physical activity (pt-stated)     Start [facility] weekly, 30 minutes Brain engagement exercises (reading, crossword puzzles etc...)       This is a list of the screening recommended for you and due dates:  Health Maintenance  Topic Date Due  . DEXA scan (bone density measurement)  11/16/2000  . Flu Shot  06/23/2018*  . Tetanus Vaccine  01/03/2024  . Pneumonia vaccines  Completed  *Topic was postponed. The date shown is not the original due date.

## 2018-05-17 ENCOUNTER — Ambulatory Visit: Payer: Self-pay | Admitting: Family Medicine

## 2018-05-17 ENCOUNTER — Other Ambulatory Visit: Payer: Self-pay | Admitting: Family Medicine

## 2018-05-17 DIAGNOSIS — E785 Hyperlipidemia, unspecified: Secondary | ICD-10-CM

## 2018-05-17 NOTE — Telephone Encounter (Signed)
FYI

## 2018-05-17 NOTE — Telephone Encounter (Signed)
Patient is calling to report that she is having R leg pain- she was feeling better at her appointment yesterday- but today her leg is hurting. Patient was driving when she called and when she arrived home- her neighbor -who is a retired Marine scientist was out and came over to look at her legs because she stated she was having swelling. ( This was not addressed at appointment) Neighbor states patient has pitting edema bilaterally- R is slightly worse that L. Edema is up to thigh. Patient does not wear support hose. Appointment made for tomorrow am to discuss her management.  Reason for Disposition . [1] MODERATE leg swelling (e.g., swelling extends up to knees) AND [2] new onset or worsening  Additional Information . Commented on: [1] Thigh, calf, or ankle swelling AND [2] bilateral AND [3] 1 side is more swollen    Patient has leg [name] and DVT was ruled out- neighbor came over and patient has pitting edema.  Answer Assessment - Initial Assessment Questions 1. ONSET: "When did the pain start?"      This morning  2. LOCATION: "Where is the pain located?"      R  below knee 3. PAIN: "How bad is the pain?"    (Scale 1-10; or mild, moderate, severe)   -  MILD (1-3): doesn't interfere with normal activities    -  MODERATE (4-7): interferes with normal activities (e.g., work or school) or awakens from sleep, limping    -  SEVERE (8-10): excruciating pain, unable to do any normal activities, unable to walk     7-8 depends on how much she is using it 4. WORK OR EXERCISE: "Has there been any recent work or exercise that involved this part of the body?"      Patient was more active yesterday 5. CAUSE: "What do you think is causing the leg pain?"     Not sure- activity 6. OTHER SYMPTOMS: "Do you have any other symptoms?" (e.g., chest pain, back pain, breathing difficulty, swelling, rash, fever, numbness, weakness)     no 7. PREGNANCY: "Is there any chance you are pregnant?" "When was your last menstrual period?"     n/a  Protocols used: LEG SWELLING AND EDEMA-A-AH, LEG PAIN-A-AH

## 2018-05-17 NOTE — Telephone Encounter (Signed)
Good.

## 2018-05-17 NOTE — Telephone Encounter (Signed)
Called and spoke with pt. Pt advised and voiced understanding. Pt stated that her knee doesn't hurt as bad today. Pt stated that she would like to give it a little bit more time if it doesn't get better she will call back to let us know if she wants to do the x-ray or see orthopedist doctor.   Sent to PCP as an [place]

## 2018-05-17 NOTE — Telephone Encounter (Signed)
Venous ultrasound with no DVT.  Potentially the pain could be related to muscular strain or joint pain.  We could consider x-ray of her knee to evaluate for arthritis or refer to orthopedics for evaluation.  Please see what she would prefer to start with.  Thanks.

## 2018-05-18 ENCOUNTER — Ambulatory Visit (INDEPENDENT_AMBULATORY_CARE_PROVIDER_SITE_OTHER): Payer: Medicare PPO

## 2018-05-18 ENCOUNTER — Ambulatory Visit: Payer: Medicare PPO | Admitting: Family Medicine

## 2018-05-18 ENCOUNTER — Encounter: Payer: Self-pay | Admitting: Family Medicine

## 2018-05-18 VITALS — BP 118/64 | HR 72 | Temp 97.8°F | Ht 66.0 in | Wt 169.0 lb

## 2018-05-18 DIAGNOSIS — M79604 Pain in right leg: Secondary | ICD-10-CM

## 2018-05-18 DIAGNOSIS — M7989 Other specified soft tissue disorders: Secondary | ICD-10-CM

## 2018-05-18 DIAGNOSIS — Z23 Encounter for immunization: Secondary | ICD-10-CM | POA: Diagnosis not present

## 2018-05-18 DIAGNOSIS — M25561 Pain in right knee: Secondary | ICD-10-CM | POA: Diagnosis not present

## 2018-05-18 LAB — COMPREHENSIVE METABOLIC PANEL
ALBUMIN: 4.4 g/dL (ref 3.5–5.2)
ALT: 15 U/L (ref 0–35)
AST: 22 U/L (ref 0–37)
Alkaline Phosphatase: 59 U/L (ref 39–117)
BILIRUBIN TOTAL: 1 mg/dL (ref 0.2–1.2)
BUN: 11 mg/dL (ref 6–23)
CALCIUM: 9.8 mg/dL (ref 8.4–10.5)
CHLORIDE: 104 meq/L (ref 96–112)
CO2: 29 meq/L (ref 19–32)
Creatinine, Ser: 0.84 mg/dL (ref 0.40–1.20)
GFR: 68.91 mL/min (ref >60.00)
Glucose, Bld: 101 mg/dL — ABNORMAL HIGH (ref 70–99)
Potassium: 4.1 mEq/L (ref 3.5–5.1)
Sodium: 140 mEq/L (ref 135–145)
Total Protein: 7.2 g/dL (ref 6.0–8.3)

## 2018-05-18 LAB — BRAIN NATRIURETIC PEPTIDE: PRO B NATRI PEPTIDE: 45 pg/mL (ref 0.0–100.0)

## 2018-05-18 MED ORDER — FUROSEMIDE 20 MG PO TABS: ORAL_TABLET | ORAL | 1 refills | Status: DC

## 2018-05-18 NOTE — Telephone Encounter (Signed)
Pt was seen by Ander Purpura today to address her issues and concerns. Orders were placed to do an X-ray as well as she stated that's he wanted to do.
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
## 2018-05-20 ENCOUNTER — Ambulatory Visit: Payer: Medicare PPO | Admitting: Family Medicine

## 2018-05-24 ENCOUNTER — Ambulatory Visit: Payer: Medicare PPO | Admitting: Urology

## 2018-05-26 ENCOUNTER — Encounter: Payer: Self-pay | Admitting: Urology

## 2018-05-26 ENCOUNTER — Telehealth: Payer: Self-pay

## 2018-05-26 ENCOUNTER — Other Ambulatory Visit: Payer: Self-pay | Admitting: Family Medicine

## 2018-05-26 ENCOUNTER — Ambulatory Visit: Payer: Medicare PPO | Admitting: Urology

## 2018-05-26 DIAGNOSIS — J309 Allergic rhinitis, unspecified: Secondary | ICD-10-CM

## 2018-05-26 NOTE — Telephone Encounter (Signed)
Copied from [place] [id number]. Topic: General - Other >> May 26, 2018 12:03 PM Scherrie Gerlach wrote: Reason for CRM: pt states she was to go to the hospital this morning for a test, and she did.  But the hospital told her they did not have anything for her. She is confused on what she needs to do, and if she needs test from the hospital.  Pt states someone called her, and told her when to be there. She feels like she wasted her whole morning and doesn't feel well anyway.

## 2018-05-26 NOTE — Telephone Encounter (Signed)
Requested medication (s) are due for refill today:   Yes for both meds  Requested medication (s) are on the active medication list:   Yes for both meds  Future visit scheduled:   Yes 11/13/17 with [place]   Last ordered: Singulair 02/22/18  #30  0 refills                          Drisdol 04/29/17  #8  0 refills  Requested Prescriptions  Pending Prescriptions Disp Refills   montelukast (SINGULAIR) 10 MG tablet 30 tablet 0    Sig: Take 1 tablet (10 mg total) by mouth daily.     Pulmonology:  Leukotriene Inhibitors Passed - 05/26/2018 12:29 PM      Passed - Valid encounter within last 12 months    Recent Outpatient Visits          1 week ago Swelling of both lower extremities   [place] [facility], Jacquelynn Cree, FNP   1 week ago Right leg pain   [facility] [place], Angela Adam, MD   1 year ago [place], Angela Adam, MD   1 year ago Bilateral leg edema   [facility] [place], Angela Adam, MD   1 year ago Paroxysmal atrial fibrillation [facility])   [place] Leone Haven, MD      Future Appointments            In 5 months Caryl Bis, Angela Adam, MD [place], [place]   In 11 months O'Brien-Blaney, Denisa L, LPN [place], [place]   In 11 months Caryl Bis, Angela Adam, MD [facility] [place], [facility]          Vitamin D, Ergocalciferol, (DRISDOL) 50000 units CAPS capsule 8 capsule 0    Sig: TAKE 1 CAPSULE BY MOUTH EVERY 7 DAYS     Endocrinology:  Vitamins - Vitamin D Supplementation Failed - 05/26/2018 12:29 PM      Failed - 50,000 IU strengths are not delegated      Failed - Phosphate in normal range and within 360 days    No results found for: PHOS       Failed - Vit D in normal range and within 360 days    VITD  Date Value Ref Range Status  05/16/2018 36.10 30.00 - 100.00 ng/mL Final         Passed - Ca in normal range  and within 360 days    Calcium  Date Value Ref Range Status  05/18/2018 9.8 8.4 - 10.5 mg/dL Final   Calcium, Total  Date Value Ref Range Status  05/15/2013 9.5 8.5 - 10.1 mg/dL Final         Passed - Valid encounter within last 12 months    Recent Outpatient Visits          1 week ago Swelling of both lower extremities   [place] [facility], Jacquelynn Cree, FNP   1 week ago Right leg pain   [facility] [place] Leone Haven, MD   1 year ago [facility] [place] Leone Haven, MD   1 year ago Bilateral leg edema   [facility] [place] Leone Haven, MD   1 year ago Paroxysmal atrial fibrillation [facility])   [facility] [place], Angela Adam, MD  Future Appointments            In 5 months Sonnenberg, Angela Adam, MD [facility], [place]   In 11 months O'Brien-Blaney, Bryson Corona, LPN [place], [place]   In 11 months Caryl Bis, Angela Adam, MD [facility], [place]

## 2018-05-26 NOTE — Telephone Encounter (Signed)
Copied from [place] [phone]. Topic: Quick Communication - Rx Refill/Question >> May 26, 2018 11:42 AM Scherrie Gerlach wrote: Medication: montelukast (SINGULAIR) 10 MG tablet Vitamin D, Ergocalciferol, (DRISDOL) 50000 units CAPS capsule (the Vit D was sent to [facility], pt does not want to use that pharmacy anymore)  Please send to [id number] [place], [place] [phone] (Phone) [phone] (Fax)

## 2018-05-26 NOTE — Telephone Encounter (Signed)
Jill Snow did you know anything about the patient needing to go to the hospital for anything?

## 2018-05-26 NOTE — Telephone Encounter (Signed)
Sent to PCP for approval if Vit D needs to be refilled again.   Vit last tested 05/16/2018

## 2018-05-27 ENCOUNTER — Telehealth: Payer: Self-pay | Admitting: Family Medicine

## 2018-05-27 MED ORDER — MONTELUKAST SODIUM 10 MG PO TABS: 10.0000 mg | ORAL_TABLET | Freq: Every day | ORAL | 0 refills | Status: DC

## 2018-05-27 NOTE — Telephone Encounter (Signed)
Copied from [place] [phone]. Topic: General - Other >> May 27, 2018  9:10 AM Conception Chancy, NT wrote: Reason for CRM: patient states she went in yesterday for a scan and they told her that it was no longer ordered by Dr. Caryl Bis. She wants to make sure. Please contact.

## 2018-05-27 NOTE — Telephone Encounter (Signed)
I received a note today that she missed an appointment for a urology visit.  I suspect that is what it was for.  She likely went to the wrong location.  I will forward to Melissa to try to get her set up with urology again.

## 2018-05-27 NOTE — Telephone Encounter (Signed)
Singulair sent to pharmacy.  The patient does not need a refill on the vitamin D.  Her recent vitamin D was in the normal range.  She can take 1000 international units of vitamin D over-the-counter daily.

## 2018-05-30 NOTE — Telephone Encounter (Signed)
Called and spoke with pt.Pt stated that she does NOT have any memory issues and she remembers things very well and does NOT feel that she needs to be evaluated for memory loss issues.   Sent to PCP

## 2018-05-30 NOTE — Telephone Encounter (Signed)
Pt called [facility] to ask date and time of CT Chest. I gave pt the information and location. Pt asked about Urology referral which was discussed and that she RS from 10/8 to 10/10 and missed appt. I provided her name of urology office ([facility]) and phone #. Pt asked again why she is supposed to go there. Pt read back CT chest information and it was wrong. Asked pt if she remembered speaking to [facility] and pt stated she had not talked to anyone this morning and maybe she missed a call. It seemed that pt was extremely confused. After multiple explanations she still did not have time of CT chest correct and asked for a letter to be mailed to her with appointment dates, times, locations, and phone #s. Discussed with Melissa who noted same concerns with memory/confusion.

## 2018-05-30 NOTE — Telephone Encounter (Signed)
She will need to be seen in the office for memory evaluation. Please contact her and offer her an appointment. Please see if the patient has noticed any memory issues or issues with confusion. If so please see how long they have been going on for. If this seems to be a recent issue she should be seen soon.  Please get her scheduled to see me or Lauren for this.

## 2018-05-30 NOTE — Telephone Encounter (Signed)
Called pt. She seemed to act like she didn't know anything about either appointments. Frustrated because of all of her appointments and didn't know what they were for.  I explained to her the best I could why she needed to see Urology and why she needed the CT. Gave her the number to call and reschedule her urology appointment and verified with her the CT scan that has been scheduled, gave her all the information.

## 2018-05-30 NOTE — Telephone Encounter (Signed)
Noted  

## 2018-05-31 DIAGNOSIS — H25043 Posterior subcapsular polar age-related cataract, bilateral: Secondary | ICD-10-CM | POA: Diagnosis not present

## 2018-05-31 DIAGNOSIS — H18413 Arcus senilis, bilateral: Secondary | ICD-10-CM | POA: Diagnosis not present

## 2018-05-31 DIAGNOSIS — H2513 Age-related nuclear cataract, bilateral: Secondary | ICD-10-CM | POA: Diagnosis not present

## 2018-05-31 DIAGNOSIS — H25013 Cortical age-related cataract, bilateral: Secondary | ICD-10-CM | POA: Diagnosis not present

## 2018-05-31 DIAGNOSIS — H2512 Age-related nuclear cataract, left eye: Secondary | ICD-10-CM | POA: Diagnosis not present

## 2018-05-31 DIAGNOSIS — H02831 Dermatochalasis of right upper eyelid: Secondary | ICD-10-CM | POA: Diagnosis not present

## 2018-06-08 ENCOUNTER — Ambulatory Visit: Admission: RE | Admit: 2018-06-08 | Payer: Medicare PPO | Source: Ambulatory Visit

## 2018-06-16 DIAGNOSIS — S51852A Open bite of left forearm, initial encounter: Secondary | ICD-10-CM | POA: Diagnosis not present

## 2018-06-16 DIAGNOSIS — S41151A Open bite of right upper arm, initial encounter: Secondary | ICD-10-CM | POA: Diagnosis not present

## 2018-06-16 DIAGNOSIS — S50819A Abrasion of unspecified forearm, initial encounter: Secondary | ICD-10-CM | POA: Diagnosis not present

## 2018-06-16 DIAGNOSIS — Z23 Encounter for immunization: Secondary | ICD-10-CM | POA: Diagnosis not present

## 2018-06-16 DIAGNOSIS — W540XXA Bitten by dog, initial encounter: Secondary | ICD-10-CM | POA: Diagnosis not present

## 2018-06-17 ENCOUNTER — Other Ambulatory Visit: Payer: Medicare PPO

## 2018-06-17 ENCOUNTER — Encounter: Payer: Self-pay | Admitting: Family Medicine

## 2018-06-17 ENCOUNTER — Ambulatory Visit: Payer: Medicare PPO | Admitting: Family Medicine

## 2018-06-17 VITALS — BP 128/76 | HR 68 | Temp 97.6°F | Ht 66.0 in | Wt 172.0 lb

## 2018-06-17 DIAGNOSIS — Z23 Encounter for immunization: Secondary | ICD-10-CM | POA: Diagnosis not present

## 2018-06-17 DIAGNOSIS — E785 Hyperlipidemia, unspecified: Secondary | ICD-10-CM | POA: Diagnosis not present

## 2018-06-17 DIAGNOSIS — L03113 Cellulitis of right upper limb: Secondary | ICD-10-CM | POA: Diagnosis not present

## 2018-06-17 DIAGNOSIS — W548XXA Other contact with dog, initial encounter: Secondary | ICD-10-CM

## 2018-06-17 LAB — HEPATIC FUNCTION PANEL
ALBUMIN: 4.4 g/dL (ref 3.5–5.2)
ALT: 13 U/L (ref 0–35)
AST: 18 U/L (ref 0–37)
Alkaline Phosphatase: 61 U/L (ref 39–117)
BILIRUBIN TOTAL: 1.1 mg/dL (ref 0.2–1.2)
Bilirubin, Direct: 0.1 mg/dL (ref 0.0–0.3)
Total Protein: 7 g/dL (ref 6.0–8.3)

## 2018-06-17 LAB — LDL CHOLESTEROL, DIRECT: Direct LDL: 224 mg/dL

## 2018-06-17 MED ORDER — CEPHALEXIN 500 MG PO CAPS: 500.0000 mg | ORAL_CAPSULE | Freq: Two times a day (BID) | ORAL | 0 refills | Status: AC

## 2018-06-17 NOTE — Patient Instructions (Signed)
Take keflex 2x per day for 7 days  Keep skin clean and dry as you have been doing, looks like things are healing well  Tetanus vaccine booster given today due to skin getting scratched by dog

## 2018-06-17 NOTE — Addendum Note (Signed)
Addended by: Leeanne Rio on: 06/17/2018 10:21 AM   Modules accepted: Orders
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
## 2018-06-20 ENCOUNTER — Telehealth: Payer: Self-pay

## 2018-06-20 NOTE — Telephone Encounter (Signed)
Called and spoke with patient and went over her lab results. Pt stated that she has been taking the simvastatin but has been without the medication for 1 week now since the prescription was never refilled for her at her last OV.   Simvastatin last refilled 05/16/2018 disp 90 with 3 refills   (she should have refills)   Pt is OK to get started on the CRESTOR.   Sent to PCP to send in Rx for patient thanks.   Pt has been scheduled for a follow up appt with Dr. Caryl Bis in February.

## 2018-06-20 NOTE — Telephone Encounter (Signed)
Sent to Mrs. Tuck    Pt stated that she would like to speak with you in regards to getting set up for her CT scan.    Thanks

## 2018-06-21 NOTE — Progress Notes (Deleted)
* [facility]     Assessment and Plan:  Obstructive sleep apnea. --AHI of 20 9 previous sleep study.  Her CPAP machine has broken, and she is having recurrent symptoms of excessive daytime sleepiness likely secondary to untreated obstructive sleep apnea. -We will send for new sleep study as per insurance requirements in order to requalify her for CPAP.  If she is not able to use CPAP, she may benefit from referral to a dentist for dental device.  Atrial fibrillation. --May be exacerbated by OSA, it is important for her to stay on CPAP.    Date: 06/21/2018  MRN# [id number] KANIJA REMMEL 1934/02/19   Jill Snow is a 84 y.o. old female seen in follow up for chief complaint of  No chief complaint on file.    HPI:  Patient is a 84 year old female with a history of obstructive sleep apnea.  At last visit there was noted her CPAP machine was broken, in order to get her a new CPAP machine we had to send her for a new study as per insurance requirement.   **Baseline sleep study 11/02/2017>> mild sleep apnea with AHI of 9.5.  Auto CPAP with pressure range 5-20 was recommended. **CPAP titration study 05/16/15>> recommended to be on CPAP of 10. **sleep study on 12/26/2010>> shows the following, the patient was sent for a split-night study at that time, her apnea popping index was noted to be 35 with mild snoring noted throughout the study. She was titrated on CPAP to an ideal pressure of 9. **Echo 03/21/2015: Ejection fraction was 60-65% echo 2014: EF 40-45%, mildly dilated RV, mod reduced RV systolic fxn, mod dilated LA, Mild to mod MR, mod TR, mildly elevated PASP   Medication:   Outpatient Encounter Medications as of 06/22/2018  Medication Sig  . cephALEXin (KEFLEX) 500 MG capsule Take 1 capsule (500 mg total) by mouth 2 (two) times daily for 7 days.  Marland Kitchen doxycycline (VIBRA-TABS) 100 MG tablet Take 1 tablet (100 mg total) by mouth 2 (two) times daily.  .  fluticasone (FLONASE) 50 MCG/ACT nasal spray Place 2 sprays into both nostrils daily.  . furosemide (LASIX) 20 MG tablet Take lasix 20mg  once daily for 3 days, then USE ONCE DAILY AS NEEDED if having fluid build up in legs.  . IBU 400 MG tablet TAKE ONE TABLET BY MOUTH EVERY 8 HOURS AS NEEDED FOR MILD PAIN  . montelukast (SINGULAIR) 10 MG tablet Take 1 tablet (10 mg total) by mouth daily.  . simvastatin (ZOCOR) 40 MG tablet Take 1 tablet (40 mg total) by mouth daily at 6 PM.  . vitamin B-12 (CYANOCOBALAMIN) 100 MCG tablet Take 1 tablet (100 mcg total) by mouth daily. Over the counter.  . Vitamin D, Ergocalciferol, (DRISDOL) 50000 units CAPS capsule TAKE 1 CAPSULE BY MOUTH EVERY 7 DAYS   No facility-administered encounter medications on file as of 06/22/2018.      Allergies:  Codeine; Augmentin [amoxicillin-pot clavulanate]; and Crestor [rosuvastatin]      LABORATORY PANEL:   CBC No results for input(s): WBC, HGB, HCT, PLT in the last 168 hours. ------------------------------------------------------------------------------------------------------------------  Chemistries  Recent Labs  Lab 06/17/18 1020  AST 18  ALT 13  ALKPHOS 61  BILITOT 1.1   ------------------------------------------------------------------------------------------------------------------  Cardiac Enzymes No results for input(s): TROPONINI in the last 168 hours. ------------------------------------------------------------  RADIOLOGY:   No results found for this or any previous visit. Results for orders placed in visit on 08/20/99  DG Chest 2  View   Narrative FINDINGS IMPRESSION 1.  QUESTIONABLE 7 MM NODULE AT THE LEFT BASE WITH OTHERWISE UNREMARKABLE CHEST AND I BELIEVE THE CHEST IS SATISFACTORY FOR THE PROPOSED SURGICAL PROCEDURE. 2.  THE PATIENT HAS HAD PREVIOUS CHEST FILMS AT [place] WILL ATTEMPT TO OBTAIN AND ADD AN ADDENDUM TO THIS REPORT.   IF PRIOR FILMS ARE NOT AVAILABLE  RECOMMEND LIMITED CT OF THE CHEST. ADDENDUM THE PATIENT WAS ABLE TO OBTAIN COPIES OF CHEST FILMS PERFORMED AT [facility] ON 01/26/96 AND THERE IS NO DEFINITE NODULE AT THE LEFT BASE.  I WOULD RECOMMEND A LIMITED CT OF THE CHEST.   ------------------------------------------------------------------------------------------------------------------  Thank  you for allowing [facility] to assist in the care of your patient. Our recommendations are noted above.  Please contact us if we can be of further service.   Marda Stalker, MD.  [facility] Office Number: [phone]

## 2018-06-21 NOTE — Telephone Encounter (Signed)
It appears she has an intolerance to Crestor.  She had muscle aches previously.  It looks like she may have had a similar issue with Lipitor.  It would seem unlikely that she has been taking her simvastatin given the elevation in her LDL.  We could recheck after she has taken her simvastatin for a period of time.  Please also see if we can get her set up for follow-up sooner than February.  Possibly later this month during a 430 appointment slot.  Thanks.

## 2018-06-22 ENCOUNTER — Ambulatory Visit: Payer: Medicare PPO | Admitting: Internal Medicine

## 2018-06-22 NOTE — Telephone Encounter (Signed)
Called pt and left a VM to call back. CRM created and sent to [facility] pool. 

## 2018-06-23 NOTE — Telephone Encounter (Signed)
Called patient and left a VM to call back. CRM created and sent to [facility] pool.  

## 2018-06-27 ENCOUNTER — Ambulatory Visit (INDEPENDENT_AMBULATORY_CARE_PROVIDER_SITE_OTHER): Payer: Medicare PPO | Admitting: Family Medicine

## 2018-06-27 ENCOUNTER — Encounter: Payer: Self-pay | Admitting: Family Medicine

## 2018-06-27 VITALS — BP 116/64 | HR 75 | Temp 97.7°F | Ht 66.0 in | Wt 171.0 lb

## 2018-06-27 DIAGNOSIS — L0390 Cellulitis, unspecified: Secondary | ICD-10-CM | POA: Insufficient documentation

## 2018-06-27 DIAGNOSIS — Z8639 Personal history of other endocrine, nutritional and metabolic disease: Secondary | ICD-10-CM | POA: Diagnosis not present

## 2018-06-27 DIAGNOSIS — J309 Allergic rhinitis, unspecified: Secondary | ICD-10-CM

## 2018-06-27 DIAGNOSIS — E041 Nontoxic single thyroid nodule: Secondary | ICD-10-CM

## 2018-06-27 DIAGNOSIS — I10 Essential (primary) hypertension: Secondary | ICD-10-CM | POA: Diagnosis not present

## 2018-06-27 DIAGNOSIS — R918 Other nonspecific abnormal finding of lung field: Secondary | ICD-10-CM | POA: Diagnosis not present

## 2018-06-27 DIAGNOSIS — Z1329 Encounter for screening for other suspected endocrine disorder: Secondary | ICD-10-CM | POA: Diagnosis not present

## 2018-06-27 DIAGNOSIS — N1339 Other hydronephrosis: Secondary | ICD-10-CM

## 2018-06-27 DIAGNOSIS — R911 Solitary pulmonary nodule: Secondary | ICD-10-CM | POA: Diagnosis not present

## 2018-06-27 DIAGNOSIS — L03114 Cellulitis of left upper limb: Secondary | ICD-10-CM

## 2018-06-27 DIAGNOSIS — R413 Other amnesia: Secondary | ICD-10-CM

## 2018-06-27 DIAGNOSIS — R7309 Other abnormal glucose: Secondary | ICD-10-CM | POA: Diagnosis not present

## 2018-06-27 LAB — BASIC METABOLIC PANEL
BUN: 12 mg/dL (ref 6–23)
CALCIUM: 9.9 mg/dL (ref 8.4–10.5)
CO2: 30 meq/L (ref 19–32)
CREATININE: 0.85 mg/dL (ref 0.40–1.20)
Chloride: 103 mEq/L (ref 96–112)
GFR: 67.95 mL/min (ref >60.00)
Glucose, Bld: 137 mg/dL — ABNORMAL HIGH (ref 70–99)
Potassium: 3.9 mEq/L (ref 3.5–5.1)
Sodium: 139 mEq/L (ref 135–145)

## 2018-06-27 LAB — TSH: TSH: 3.13 u[IU]/mL (ref 0.35–4.50)

## 2018-06-27 LAB — HEMOGLOBIN A1C: Hgb A1c MFr Bld: 5.2 % (ref 4.6–6.5)

## 2018-06-27 LAB — VITAMIN B12: VITAMIN B 12: 566 pg/mL (ref 211–911)

## 2018-06-27 MED ORDER — CEPHALEXIN 500 MG PO CAPS: 500.0000 mg | ORAL_CAPSULE | Freq: Four times a day (QID) | ORAL | 0 refills | Status: DC

## 2018-06-27 MED ORDER — FLUTICASONE PROPIONATE 50 MCG/ACT NA SUSP: 2.0000 | Freq: Every day | NASAL | 6 refills | Status: DC

## 2018-06-27 NOTE — Assessment & Plan Note (Signed)
History of B12 deficiency.  We will check B12.

## 2018-06-27 NOTE — Telephone Encounter (Signed)
Patient was seen for an appt today discuss this information with patient. Pt advised and voiced understanding. Pt has an appt with PCP on 07/11/2018.

## 2018-06-27 NOTE — Patient Instructions (Signed)
Nice to see you.  We will treat you for a skin infection.  I have sent in Keflex for you to take.  Please check when you get home to ensure that this is not what you have already taken.  Please contact us and let us know what the antibiotics are that you have taken when you review your medicines at home. Please start using your Flonase again. We will get you set up to see urology in [place].  We will also get your CT scan of your chest set up. Please consider seeing the neurologist for your memory. If you develop spreading redness, fevers, swelling, increased pain, or any new or changing symptoms please go to the emergency room.

## 2018-06-27 NOTE — Assessment & Plan Note (Signed)
We will get her set up with [facility].  She has missed appointments locally and reports this is related to her not liking the hospital locally.

## 2018-06-27 NOTE — Assessment & Plan Note (Signed)
Seems to be having memory difficulty.  She denies any issues with this.  She did fail the mini cog testing.  Discussed referral to neurology though she declined.  She noted she would potentially consider this after her cataract surgery.

## 2018-06-27 NOTE — Assessment & Plan Note (Signed)
CT scan ordered and scheduled for the patient.  She and her friend were advised of when this was scheduled.

## 2018-06-27 NOTE — Assessment & Plan Note (Signed)
Benign on prior biopsy.  Check TSH.

## 2018-06-27 NOTE — Progress Notes (Signed)
[name], MD Phone: [phone]  Jill Snow is a 84 y.o. female who presents today for f/u.  CC: cellulitis, memory, throat congestion  Cellulitis: Patient notes several scratches from a dog in her neighborhood.  It is a tame dog that is owned by 1 of her neighbors.  She notes no bite wounds.  She does note the main scratch on her left dorsal forearm is healing though it did blister up recently.  It appears to have some spreading redness.  There is bruising around it.  She notes these areas have been present for the last 3 weeks.  There is another open wound that is dry more proximally on her dorsal forearm.  She has had no fevers.  She has been evaluated twice for this.  1 of her friends presents with her today and thinks that she has not taken the Keflex that was prescribed through our office previously.  They believe she has taken the doxycycline.  The patient has not seen urology or had her chest CT yet.  She missed these appointments or showed up to the wrong place for them.  I discussed the patient's memory with her.  The patient does not feel like she has any memory difficulties.  I expressed concern given that she has missed several specialty appointments and imaging appointments or shown up to the wrong location.  It also seems that she did not report going to [facility] walk-in clinic for her initial evaluation for the dog scratches when she returned to our office the very next day to receive treatment for this as well.  She notes she drives herself and does not get lost.  She bathes and dresses herself.  She cooks for herself.  She manages her own money.  Her friend who presents with her today notes that she does repeat things over and over and over again.  Congestion: Patient notes some congestion in her throat that is been present for 2 weeks with postnasal drip.  Not blowing anything out of her nose.  Some sniffling.  Some mild cough.  She has not been using her  Flonase.  Patient is due for cataract removal in the next several weeks.  Social History   Tobacco Use  Smoking Status Never Smoker  Smokeless Tobacco Never Used     ROS see history of present illness  Objective  Physical Exam Vitals:   06/27/18 1013  BP: 116/64  Pulse: 75  Temp: 97.7 F (36.5 C)  SpO2: 97%    BP Readings from Last 3 Encounters:  06/27/18 116/64  06/17/18 128/76  05/18/18 118/64   Wt Readings from Last 3 Encounters:  06/27/18 171 lb (77.6 kg)  06/17/18 172 lb (78 kg)  05/18/18 169 lb (76.7 kg)    Physical Exam  Constitutional: No distress.  HENT:  Head: Normocephalic and atraumatic.  Mouth/Throat: Oropharynx is clear and moist.  Neck: Neck supple.  Cardiovascular: Normal rate, regular rhythm and normal heart sounds.  Pulmonary/Chest: Effort normal and breath sounds normal.  Musculoskeletal: She exhibits no edema.       Arms: Lymphadenopathy:    She has no cervical adenopathy.  Neurological: She is alert.  Skin: Skin is warm and dry. She is not diaphoretic.  Mini cog testing recall 1/3 words, unable to draw the clock   Assessment/Plan: Please see individual problem list.  Allergic rhinitis Symptoms most consistent with allergic rhinitis.  We will refill her Flonase.  Encouraged her to use this.  Very Mild  Right Hydronephrosis We will get her set up with [facility].  She has missed appointments locally and reports this is related to her not liking the hospital locally.  Lung nodule CT scan ordered and scheduled for the patient.  She and her friend were advised of when this was scheduled.  Memory difficulty Seems to be having memory difficulty.  She denies any issues with this.  She did fail the mini cog testing.  Discussed referral to neurology though she declined.  She noted she would potentially consider this after her cataract surgery.  History of non anemic vitamin B12 deficiency History of B12 deficiency.  We will check  B12.  Thyroid nodule Benign on prior biopsy.  Check TSH.  Cellulitis Concerning for cellulitis in her left upper extremity related to dog scratches.  She denies dog bite wound.  Appears well.  Does not appear that she took the Keflex that was previously prescribed.  We will prescribe Keflex today.  Her friend will check to see what antibiotics she has at home and contact us if she has already taken the Keflex.  She is given return precautions.  I will recheck her in 2 days.   Orders Placed This Encounter  Procedures  . CT Chest Wo Contrast    Standing Status:   Future    Standing Expiration Date:   08/28/2019    Order Specific Question:   Preferred imaging location?    Answer:   [facility]    Order Specific Question:   Radiology Contrast Protocol - do NOT remove file path    Answer:   [email or web address]  . B12  . TSH  . Basic Metabolic Panel (BMET)  . HgB A1c    Meds ordered this encounter  Medications  . cephALEXin (KEFLEX) 500 MG capsule    Sig: Take 1 capsule (500 mg total) by mouth 4 (four) times daily.    Dispense:  20 capsule    Refill:  0  . fluticasone (FLONASE) 50 MCG/ACT nasal spray    Sig: Place 2 sprays into both nostrils daily.    Dispense:  16 g    Refill:  [place], MD [place]

## 2018-06-27 NOTE — Assessment & Plan Note (Signed)
Concerning for cellulitis in her left upper extremity related to dog scratches.  She denies dog bite wound.  Appears well.  Does not appear that she took the Keflex that was previously prescribed.  We will prescribe Keflex today.  Her friend will check to see what antibiotics she has at home and contact us if she has already taken the Keflex.  She is given return precautions.  I will recheck her in 2 days.

## 2018-06-27 NOTE — Assessment & Plan Note (Signed)
Symptoms most consistent with allergic rhinitis.  We will refill her Flonase.  Encouraged her to use this.

## 2018-06-28 ENCOUNTER — Telehealth: Payer: Self-pay

## 2018-06-28 NOTE — Telephone Encounter (Signed)
Copied from [place] [id number]. Topic: Quick Communication - See Telephone Encounter >> Jun 28, 2018 10:09 AM Rayann Heman wrote: CRM for notification. See Telephone encounter for: 06/28/18. Daisy with [facility] with [facility] called and stated that she would like to talk to a nurse regarding CT scan.she is also requesting to know if ct scan for 05/23/18 was completed Please advise Cb#[phone] Fax [phone]

## 2018-06-29 ENCOUNTER — Encounter: Payer: Self-pay | Admitting: Family Medicine

## 2018-06-29 ENCOUNTER — Ambulatory Visit: Payer: Medicare PPO | Admitting: Family Medicine

## 2018-06-29 ENCOUNTER — Telehealth: Payer: Self-pay

## 2018-06-29 VITALS — BP 128/64 | HR 75 | Temp 97.4°F | Ht 66.0 in | Wt 170.2 lb

## 2018-06-29 DIAGNOSIS — L03114 Cellulitis of left upper limb: Secondary | ICD-10-CM

## 2018-06-29 DIAGNOSIS — E785 Hyperlipidemia, unspecified: Secondary | ICD-10-CM | POA: Diagnosis not present

## 2018-06-29 DIAGNOSIS — R413 Other amnesia: Secondary | ICD-10-CM

## 2018-06-29 DIAGNOSIS — N1339 Other hydronephrosis: Secondary | ICD-10-CM | POA: Diagnosis not present

## 2018-06-29 DIAGNOSIS — E7800 Pure hypercholesterolemia, unspecified: Secondary | ICD-10-CM

## 2018-06-29 DIAGNOSIS — R911 Solitary pulmonary nodule: Secondary | ICD-10-CM | POA: Diagnosis not present

## 2018-06-29 MED ORDER — ATORVASTATIN CALCIUM 40 MG PO TABS: 40.0000 mg | ORAL_TABLET | Freq: Every day | ORAL | 3 refills | Status: DC

## 2018-06-29 NOTE — Assessment & Plan Note (Signed)
Discussed the importance of keeping her appointment for CT imaging.

## 2018-06-29 NOTE — Patient Instructions (Signed)
Nice to see you. Please complete the course of Keflex.  If you do not continue to improve by Friday please call us. We will switch to Lipitor from your simvastatin.  When you receive the Lipitor you will discontinue the simvastatin.  Please decrease your soda intake. We will have you return in 1 month for repeat labs.

## 2018-06-29 NOTE — Telephone Encounter (Signed)
FYI

## 2018-06-29 NOTE — Assessment & Plan Note (Signed)
She will complete her Keflex.  If she has recurrence or worsening symptoms she will contact us immediately.  Given return precautions.

## 2018-06-29 NOTE — Assessment & Plan Note (Signed)
I again offered referral to neurology and she accepted.  Referral placed.

## 2018-06-29 NOTE — Telephone Encounter (Signed)
Sent to PCP ?

## 2018-06-29 NOTE — Telephone Encounter (Signed)
I will see her though she may have to wait to be seen.

## 2018-06-29 NOTE — Telephone Encounter (Signed)
Duplicated

## 2018-06-29 NOTE — Progress Notes (Signed)
  [name], MD Phone: [phone]  Jill Snow is a 84 y.o. female who presents today for follow-up.  CC: Cellulitis, hyperlipidemia, hydronephrosis, lung nodule, memory difficulty  Cellulitis: Patient notes quite a bit improved.  Notes pain.  No spreading redness.  No fevers.  She is taking her Keflex.  She is tolerating this well.  Hyperlipidemia: LDL noted to be quite elevated.  She is been taking simvastatin.  Her son presents with her today and notes she is taking in lots of sugary drinks.  Allergies.  I did discuss the importance of patient having her follow-up CT scan and following up with urology.  The patient's son is aware of these appointments.  Patient's son has noted that the patient has had some memory issues.  It seems to be short-term memory difficulty.  Sometimes she is better than others.  She can remember all sorts of things from the distant past.  Has not been getting lost.  The patient's son and daughter are more involved now.  Social History   Tobacco Use  Smoking Status Never Smoker  Smokeless Tobacco Never Used     ROS see history of present illness  Objective  Physical Exam Vitals:   06/29/18 1350  BP: 128/64  Pulse: 75  Temp: (!) 97.4 F (36.3 C)  SpO2: 98%    BP Readings from Last 3 Encounters:  06/29/18 128/64  06/27/18 116/64  06/17/18 128/76   Wt Readings from Last 3 Encounters:  06/29/18 170 lb 3.2 oz (77.2 kg)  06/27/18 171 lb (77.6 kg)  06/17/18 172 lb (78 kg)    Physical Exam  Constitutional: No distress.  Cardiovascular: Normal rate, regular rhythm and normal heart sounds.  Pulmonary/Chest: Effort normal and breath sounds normal.  Neurological: She is alert.  Skin: Skin is warm and dry. She is not diaphoretic.        Assessment/Plan: Please see individual problem list.  Right hydronephrosis She will keep her urology appointment.  Discussed the importance of this.  Cellulitis She will complete her  Keflex.  If she has recurrence or worsening symptoms she will contact us immediately.  Given return precautions.  Hypercholesterolemia Discussed changing the Lipitor.  She will return in 1 month for labs.  Lung nodule Discussed the importance of keeping her appointment for CT imaging.  Memory difficulty I again offered referral to neurology and she accepted.  Referral placed.   Orders Placed This Encounter  Procedures  . Hepatic function panel    Standing Status:   Future    Standing Expiration Date:   06/30/2019  . LDL cholesterol, direct    Standing Status:   Future    Standing Expiration Date:   06/30/2019  . Ambulatory referral to Neurology    Referral Priority:   Routine    Referral Type:   Consultation    Referral Reason:   Specialty Services Required    Requested Specialty:   Neurology    Number of Visits Requested:   1    Meds ordered this encounter  Medications  . atorvastatin (LIPITOR) 40 MG tablet    Sig: Take 1 tablet (40 mg total) by mouth daily.    Dispense:  90 tablet    Refill:  Floyd, MD [place]

## 2018-06-29 NOTE — Telephone Encounter (Signed)
Copied from [place] [id number]. Topic: Appointment Scheduling - Scheduling Inquiry for Clinic >> Jun 29, 2018  8:47 AM Bea Graff, NT wrote: Reason for CRM: Pt states she may have to show up late for her appt today due to the other caregiver for her son in law will not be available until 2pm. She is wanting to see if she can still be seen? Please advise.

## 2018-06-29 NOTE — Telephone Encounter (Signed)
Daisy with [facility] states that she has received a fax request from the office for a CT scan that is to be done on 11/19. She states she will be cancelling the new request and extending the old request that was done on 10/7 effective 06-29-18 and expires in 30 days.  Approval #: [id number] CB#: [phone].

## 2018-06-29 NOTE — Telephone Encounter (Signed)
Called patient and left a detailed VM that Dr. Caryl Bis could still see her today but she might have to wait a little while since she will be running late.   Left my name and our call back number [phone]

## 2018-06-29 NOTE — Telephone Encounter (Signed)
Did the patient ever do the CT scan?

## 2018-06-29 NOTE — Telephone Encounter (Signed)
Called patient and left a detailed VM that Dr. Caryl Bis could still see her today but she might have to wait a little while since she will be running late.   Left my name and our call back number [phone]

## 2018-06-29 NOTE — Assessment & Plan Note (Signed)
She will keep her urology appointment.  Discussed the importance of this.

## 2018-06-29 NOTE — Telephone Encounter (Signed)
Called and spoke with [facility] to advise them that patient has not done CT and is scheduled for 07/05/2018. Customer service at [facility] stated that [place] has been approved and pt has until Jul 29, 2018 to get this done sent to PCP as an FYI.

## 2018-06-29 NOTE — Telephone Encounter (Signed)
Noted.  She did not do the CT scan.  She is scheduled for 07/05/2018.

## 2018-06-29 NOTE — Assessment & Plan Note (Signed)
Discussed changing the Lipitor.  She will return in 1 month for labs.

## 2018-07-04 ENCOUNTER — Ambulatory Visit: Payer: Medicare PPO | Admitting: Internal Medicine

## 2018-07-04 DIAGNOSIS — H2512 Age-related nuclear cataract, left eye: Secondary | ICD-10-CM | POA: Diagnosis not present

## 2018-07-05 ENCOUNTER — Ambulatory Visit: Payer: Medicare PPO

## 2018-07-05 DIAGNOSIS — H2511 Age-related nuclear cataract, right eye: Secondary | ICD-10-CM | POA: Diagnosis not present

## 2018-07-08 ENCOUNTER — Telehealth: Payer: Self-pay

## 2018-07-08 NOTE — Telephone Encounter (Signed)
Copied from [place] [id number]. Topic: General - Other >> Jul 08, 2018  1:27 PM Jill Snow, [place] C wrote: Reason for CRM: pt is calling in to ask if her PCP wants / need to see her? Pt says that she is having surgery on her eye and has to have a consult on Monday but says that she vagly remember being told that PCP wanted to follow up with her but isn't quite sure.   Please advise.  CB:   [phone]

## 2018-07-08 NOTE — Telephone Encounter (Signed)
She was to return around 07/29/2018 for lab work and around 09/29/2017 for follow-up with me.

## 2018-07-08 NOTE — Telephone Encounter (Signed)
Please advise 

## 2018-07-11 ENCOUNTER — Ambulatory Visit: Payer: Medicare PPO | Admitting: Family Medicine

## 2018-07-11 NOTE — Telephone Encounter (Signed)
LMTCB to make lab appointment for around 07/29/18.

## 2018-07-19 ENCOUNTER — Telehealth: Payer: Self-pay | Admitting: *Deleted

## 2018-07-19 NOTE — Telephone Encounter (Signed)
Records faxed as requested.

## 2018-07-19 NOTE — Telephone Encounter (Signed)
Copied from [place] [id number]. Topic: General - Other >> Jul 19, 2018  9:17 AM Bea Graff, NT wrote: Reason for CRM: [facility] with [facility] calling to request imaging and OV notes pertaining to kidney stone for this pt to be faxed to them. Fax#: [phone] CB#: [phone] ext: [phone]

## 2018-07-25 DIAGNOSIS — H2511 Age-related nuclear cataract, right eye: Secondary | ICD-10-CM | POA: Diagnosis not present

## 2018-07-28 ENCOUNTER — Ambulatory Visit: Payer: Medicare PPO | Admitting: Family Medicine

## 2018-07-29 ENCOUNTER — Other Ambulatory Visit: Payer: Medicare PPO

## 2018-08-27 DIAGNOSIS — I4891 Unspecified atrial fibrillation: Secondary | ICD-10-CM | POA: Diagnosis not present

## 2018-08-27 DIAGNOSIS — E785 Hyperlipidemia, unspecified: Secondary | ICD-10-CM | POA: Diagnosis not present

## 2018-08-27 DIAGNOSIS — Z6826 Body mass index (BMI) 26.0-26.9, adult: Secondary | ICD-10-CM | POA: Diagnosis not present

## 2018-09-12 ENCOUNTER — Other Ambulatory Visit: Payer: Self-pay | Admitting: Family Medicine

## 2018-09-12 ENCOUNTER — Ambulatory Visit: Payer: Medicare PPO | Admitting: Family Medicine

## 2018-09-12 ENCOUNTER — Encounter

## 2018-09-12 DIAGNOSIS — Z0289 Encounter for other administrative examinations: Secondary | ICD-10-CM

## 2018-09-12 NOTE — Telephone Encounter (Signed)
Copied from [place] [id number]. Topic: Quick Communication - Rx Refill/Question >> Sep 12, 2018  3:34 PM Yvette Rack wrote: [facility] called to advise that patient is completely out of the medication and would like the Rx sent to local pharmacy.  Medication: atorvastatin (LIPITOR) 40 MG tablet  Has the patient contacted their pharmacy? yes   Preferred Pharmacy (with phone number or street name): [id number] [address], [place] [phone] (Phone)  [phone] (Fax)  Agent: Please be advised that RX refills may take up to 3 business days. We ask that you follow-up with your pharmacy.

## 2018-09-13 MED ORDER — ATORVASTATIN CALCIUM 40 MG PO TABS: 40.0000 mg | ORAL_TABLET | Freq: Every day | ORAL | 0 refills | Status: DC

## 2018-09-21 ENCOUNTER — Ambulatory Visit: Payer: Medicare PPO | Admitting: Family Medicine

## 2018-10-31 ENCOUNTER — Telehealth: Payer: Self-pay
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
## 2018-10-31 NOTE — Telephone Encounter (Signed)
The patient's information was sent to Charge correction to have the fee removed for DOS 1.27.2020.

## 2018-10-31 NOTE — Telephone Encounter (Signed)
Pt advised.

## 2018-11-14 ENCOUNTER — Ambulatory Visit: Payer: Medicare PPO | Admitting: Family Medicine

## 2018-12-07 ENCOUNTER — Other Ambulatory Visit: Payer: Self-pay | Admitting: Family Medicine

## 2018-12-07 NOTE — Telephone Encounter (Signed)
[facility] and patient would like a call once this is resent.

## 2018-12-07 NOTE — Telephone Encounter (Signed)
Patient is calling again regarding her D2 medication.  Patient stated that the office is sending the wrong medication to the pharmacy and she does not want the OTC medication.  Please explain and call patient back at [phone]

## 2018-12-08 NOTE — Telephone Encounter (Signed)
Patient stated that taking the over the counter vitamin D 2000 is not helping the pain for her arthritis pain.  She is in a lot of pain and can hardly walk.  She needs vitamin D2.  Please advise.

## 2018-12-08 NOTE — Telephone Encounter (Signed)
Patient is requesting refill on Vitamin D, Ergocalciferol, (DRISDOL) 50000 units CAPS capsule. Patien last lab was normal 05/16/18  And this medication per chart last filled by Dr. Rockey Situ in 2018 for 8 tablets.

## 2018-12-08 NOTE — Telephone Encounter (Signed)
Her most recent vitamin d level was in the normal range. She does not need the 50,000 IU version of vitamin d. She can take OTC vitamin D 2000 IU daily.

## 2018-12-09 ENCOUNTER — Telehealth: Payer: Self-pay

## 2018-12-09 NOTE — Telephone Encounter (Signed)
Copied from [place] [id number]. Topic: Quick Communication - Rx Refill/Question >> Dec 09, 2018 12:15 PM Stovall, Shana A wrote: Medication: Vitamin D, Ergocalciferol, (DRISDOL) 50000 units CAPS capsule [[id number]/  she does not want to OTC.  She would he the script for the vit D 2 sent in   Has the patient contacted their pharmacy? No. (Agent: If no, request that the patient contact the pharmacy for the refill.) (Agent: If yes, when and what did the pharmacy advise?)  Preferred Pharmacy (with phone number or street name): [id number] [place], [place] - [address] [phone] (Phone)   Agent: Please be advised that RX refills may take up to 3 business days. We ask that you follow-up with your pharmacy.

## 2018-12-09 NOTE — Telephone Encounter (Signed)
Sent to PCP ?

## 2018-12-09 NOTE — Telephone Encounter (Signed)
Last OV 06/27/2018  Last refilled 04/29/2017 disp 8 with no refills   Vit d last check 05/16/2018 low side of normal 36.10 should be 30-100  Next OV 12/20/2018  Sent to PCP to advise

## 2018-12-10 MED ORDER — VITAMIN D (ERGOCALCIFEROL) 1.25 MG (50000 UNIT) PO CAPS: ORAL_CAPSULE | ORAL | 0 refills | Status: DC

## 2018-12-10 NOTE — Telephone Encounter (Signed)
This is not something that is typically given for pain. I will refill a short term supply though she will have to come in as well to have a vitamin D level checked and to be evaluated for her pain.

## 2018-12-10 NOTE — Addendum Note (Signed)
Addended by: Caryl Bis, ERIC G on: 12/10/2018 01:07 PM   Modules accepted: Orders

## 2018-12-12 NOTE — Telephone Encounter (Signed)
Patient said she called the pharmacy and they told her the one that he had called in was over the counter and it would not really help her. Please contact pt

## 2018-12-12 NOTE — Telephone Encounter (Signed)
Patient called, left VM to return call back to the office. 

## 2018-12-12 NOTE — Telephone Encounter (Signed)
Called pt and left a VM to call back. CRM created and sent to [facility] pool. 

## 2018-12-12 NOTE — Telephone Encounter (Signed)
Pt scheduled for a Doxyme appt with Philis Nettle for right arm and hand pain.

## 2018-12-12 NOTE — Telephone Encounter (Signed)
Called pt back and left my direct phone number and the main office number to call me back today to discuss.

## 2018-12-13 ENCOUNTER — Telehealth: Payer: Self-pay | Admitting: Family Medicine

## 2018-12-13 ENCOUNTER — Telehealth: Payer: Self-pay | Admitting: Lab

## 2018-12-13 ENCOUNTER — Ambulatory Visit: Payer: Medicare PPO | Admitting: Family Medicine

## 2018-12-13 NOTE — Telephone Encounter (Signed)
No Pt never answered

## 2018-12-13 NOTE — Telephone Encounter (Signed)
1 pm slot now filled  She would need a different time

## 2018-12-13 NOTE — Telephone Encounter (Signed)
Patient called x3 to get set up for her doxy appt, no answer

## 2018-12-13 NOTE — Telephone Encounter (Signed)
PT IS NOW AT HER PHONE [phone] pLS CALL BACK AT ANYTIME, PHONE IS NOW WITH HER

## 2018-12-13 NOTE — Telephone Encounter (Signed)
Called Pt to remind her of her Doxy appt today, at 8:00am No answer

## 2018-12-13 NOTE — Telephone Encounter (Signed)
Called Pt to see if she wanted a visit today with NP Lauren Guse at 1:00pm, No answer. Left a VM to call office if she still wants a visit today.

## 2018-12-13 NOTE — Telephone Encounter (Signed)
Error

## 2018-12-14 NOTE — Telephone Encounter (Signed)
Pt never answered phone to schedule appt

## 2018-12-20 ENCOUNTER — Ambulatory Visit (INDEPENDENT_AMBULATORY_CARE_PROVIDER_SITE_OTHER): Payer: Medicare PPO | Admitting: Family Medicine

## 2018-12-20 ENCOUNTER — Ambulatory Visit: Payer: Medicare PPO | Admitting: Family Medicine

## 2018-12-20 ENCOUNTER — Encounter: Payer: Self-pay | Admitting: Family Medicine

## 2018-12-20 ENCOUNTER — Other Ambulatory Visit: Payer: Self-pay

## 2018-12-20 DIAGNOSIS — J989 Respiratory disorder, unspecified: Secondary | ICD-10-CM | POA: Diagnosis not present

## 2018-12-20 NOTE — Progress Notes (Signed)
Pt is c/o chest congestion. Started 5 days ago coughing up clear mucus feels stick in her throat. No fever, no SOB, no travel in the past 3-4 months. Pt has not been exposed to COVID-19.

## 2018-12-20 NOTE — Progress Notes (Signed)
Unable to reach patient for appointment. She was rescheduled for later today.

## 2018-12-20 NOTE — Progress Notes (Deleted)
Virtual Visit via telephone Note  This visit type was conducted due to national recommendations for restrictions regarding the COVID-19 pandemic (e.g. social distancing).  This format is felt to be most appropriate for this patient at this time.  All issues noted in this document were discussed and addressed.  No physical exam was performed (except for noted visual exam findings with Video Visits).   I connected with Rande Brunt today at  8:30 AM EDT by telephone and verified that I am speaking with the correct person using two identifiers. Location patient: home Location provider: work Persons participating in the virtual visit: patient, provider  I discussed the limitations, risks, security and privacy concerns of performing an evaluation and management service by telephone and the availability of in person appointments. I also discussed with the patient that there may be a patient responsible charge related to this service. The patient expressed understanding and agreed to proceed.  Interactive audio and video telecommunications were attempted between this provider and patient, however failed, due to patient having technical difficulties OR patient did not have access to video capability.  We continued and completed visit with audio only.  Reason for visit: ***  HPI: ***   ROS: See pertinent positives and negatives per HPI.  Past Medical History:  Diagnosis Date  . Atrial fibrillation ([place])   . Atrial flutter ([place])    a. s/p successful TEE/DCCV in 2014; b. TEE 2014 showed EF 45-50%, mild bi-atrial enlargement, mod MR  . Dyspnea   . History of chicken pox   . Hypercholesterolemia   . Hypertension   . Mitral regurgitation    a. echo 2014: EF 40-45%, mildly dilated RV, mod reduced RV systolic fxn, mod dilated LA, Mild to mod MR, mod TR, mildly elevated PASP  . PAF (paroxysmal atrial fibrillation) ([place])    a. initial episode 2011; b. not on long term anticoagulation since  06/2013  . RBBB (right bundle branch block)   . Seasonal allergies   . Sleep apnea    a. on CPAP    Past Surgical History:  Procedure Laterality Date  . ABDOMINAL HYSTERECTOMY    . TONSILLECTOMY AND ADENOIDECTOMY  1947  . tummy tuck      Family History  Problem Relation Age of Onset  . Stroke Mother   . Heart attack Mother   . Arthritis Mother   . Hyperlipidemia Mother   . Heart disease Mother   . Diabetes Mother   . Diabetes Sister   . Cancer Daughter   . Heart disease Daughter   . Diabetes Daughter   . Diabetes Brother   . Diabetes Son   . Diabetes Maternal Grandmother   . Diabetes Maternal Grandfather     SOCIAL HX: ***   Current Outpatient Medications:  .  atorvastatin (LIPITOR) 40 MG tablet, Take 1 tablet (40 mg total) by mouth daily., Disp: 90 tablet, Rfl: 0 .  cephALEXin (KEFLEX) 500 MG capsule, Take 1 capsule (500 mg total) by mouth 4 (four) times daily., Disp: 20 capsule, Rfl: 0 .  CVS D3 50 MCG (2000 UT) CAPS, TAKE 1 CAPSULE EVERY DAY, Disp: 100 capsule, Rfl: 3 .  fluticasone (FLONASE) 50 MCG/ACT nasal spray, Place 2 sprays into both nostrils daily., Disp: 16 g, Rfl: 6 .  furosemide (LASIX) 20 MG tablet, Take lasix 20mg  once daily for 3 days, then USE ONCE DAILY AS NEEDED if having fluid build up in legs., Disp: 20 tablet, Rfl: 1 .  IBU 400  MG tablet, TAKE ONE TABLET BY MOUTH EVERY 8 HOURS AS NEEDED FOR MILD PAIN, Disp: 30 tablet, Rfl: 0 .  montelukast (SINGULAIR) 10 MG tablet, Take 1 tablet (10 mg total) by mouth daily., Disp: 30 tablet, Rfl: 0 .  vitamin B-12 (CYANOCOBALAMIN) 100 MCG tablet, Take 1 tablet (100 mcg total) by mouth daily. Over the counter., Disp: 30 tablet, Rfl: 0 .  Vitamin D, Ergocalciferol, (DRISDOL) 1.25 MG (50000 UT) CAPS capsule, TAKE 1 CAPSULE BY MOUTH EVERY 7 DAYS, Disp: 4 capsule, Rfl: 0  EXAM:  VITALS per patient if applicable:  GENERAL: alert, oriented, appears well and in no acute distress  HEENT: atraumatic, conjunttiva  clear, no obvious abnormalities on inspection of external nose and ears  NECK: normal movements of the head and neck  LUNGS: on inspection no signs of respiratory distress, breathing rate appears normal, no obvious gross SOB, gasping or wheezing  CV: no obvious cyanosis  MS: moves all visible extremities without noticeable abnormality  PSYCH/NEURO: pleasant and cooperative, no obvious depression or anxiety, speech and thought processing grossly intact  ASSESSMENT AND PLAN:  Discussed the following assessment and plan:  No diagnosis found.  No problem-specific Assessment & Plan notes found for this encounter.    I discussed the assessment and treatment plan with the patient. The patient was provided an opportunity to ask questions and all were answered. The patient agreed with the plan and demonstrated an understanding of the instructions.   The patient was advised to call back or seek an in-person evaluation if the symptoms worsen or if the condition fails to improve as anticipated.  I provided *** minutes of non-face-to-face time during this encounter.   [name], MD

## 2018-12-20 NOTE — Progress Notes (Signed)
Virtual Visit via telephone Note  This visit type was conducted due to national recommendations for restrictions regarding the COVID-19 pandemic (e.g. social distancing).  This format is felt to be most appropriate for this patient at this time.  All issues noted in this document were discussed and addressed.  No physical exam was performed (except for noted visual exam findings with Video Visits).   I connected with Jill Snow today at  3:15 PM EDT by telephone and verified that I am speaking with the correct person using two identifiers. Location patient: home Location provider: work Persons participating in the virtual visit: patient, provider  I discussed the limitations, risks, security and privacy concerns of performing an evaluation and management service by telephone and the availability of in person appointments. I also discussed with the patient that there may be a patient responsible charge related to this service. The patient expressed understanding and agreed to proceed.  Interactive audio and video telecommunications were attempted between this provider and patient, however failed, due to patient having technical difficulties OR patient did not have access to video capability.  We continued and completed visit with audio only.  Reason for visit: cough  HPI: Patient notes cough starting 5 days ago. Feels like there is mucus stuck in her throat. Her chest feels congested. Cough is productive of "yucky looking mucus." No fever, though she has been taking tylenol once daily. No dyspnea. No travel. No COVID19 exposure. She feels as though she is getting a little better.    ROS: See pertinent positives and negatives per HPI.  Past Medical History:  Diagnosis Date  . Atrial fibrillation ([place])   . Atrial flutter ([place])    a. s/p successful TEE/DCCV in 2014; b. TEE 2014 showed EF 45-50%, mild bi-atrial enlargement, mod MR  . Dyspnea   . History of chicken pox   .  Hypercholesterolemia   . Hypertension   . Mitral regurgitation    a. echo 2014: EF 40-45%, mildly dilated RV, mod reduced RV systolic fxn, mod dilated LA, Mild to mod MR, mod TR, mildly elevated PASP  . PAF (paroxysmal atrial fibrillation) ([place])    a. initial episode 2011; b. not on long term anticoagulation since 06/2013  . RBBB (right bundle branch block)   . Seasonal allergies   . Sleep apnea    a. on CPAP    Past Surgical History:  Procedure Laterality Date  . ABDOMINAL HYSTERECTOMY    . TONSILLECTOMY AND ADENOIDECTOMY  1947  . tummy tuck      Family History  Problem Relation Age of Onset  . Stroke Mother   . Heart attack Mother   . Arthritis Mother   . Hyperlipidemia Mother   . Heart disease Mother   . Diabetes Mother   . Diabetes Sister   . Cancer Daughter   . Heart disease Daughter   . Diabetes Daughter   . Diabetes Brother   . Diabetes Son   . Diabetes Maternal Grandmother   . Diabetes Maternal Grandfather     Current Outpatient Medications:  .  atorvastatin (LIPITOR) 40 MG tablet, Take 1 tablet (40 mg total) by mouth daily., Disp: 90 tablet, Rfl: 0 .  CVS D3 50 MCG (2000 UT) CAPS, TAKE 1 CAPSULE EVERY DAY, Disp: 100 capsule, Rfl: 3 .  fluticasone (FLONASE) 50 MCG/ACT nasal spray, Place 2 sprays into both nostrils daily., Disp: 16 g, Rfl: 6 .  furosemide (LASIX) 20 MG tablet, Take lasix 20mg  once daily  for 3 days, then USE ONCE DAILY AS NEEDED if having fluid build up in legs., Disp: 20 tablet, Rfl: 1 .  IBU 400 MG tablet, TAKE ONE TABLET BY MOUTH EVERY 8 HOURS AS NEEDED FOR MILD PAIN, Disp: 30 tablet, Rfl: 0 .  montelukast (SINGULAIR) 10 MG tablet, Take 1 tablet (10 mg total) by mouth daily., Disp: 30 tablet, Rfl: 0 .  vitamin B-12 (CYANOCOBALAMIN) 100 MCG tablet, Take 1 tablet (100 mcg total) by mouth daily. Over the counter., Disp: 30 tablet, Rfl: 0 .  Vitamin D, Ergocalciferol, (DRISDOL) 1.25 MG (50000 UT) CAPS capsule, TAKE 1 CAPSULE BY MOUTH EVERY 7 DAYS,  Disp: 4 capsule, Rfl: 0  EXAM: This was a telehealth telephone visit and no physical exam was completed.   ASSESSMENT AND PLAN:  Discussed the following assessment and plan:  Respiratory illness  Respiratory illness I suspect the patient has a viral URI or bronchitis. I did discuss that there is a small chance that this could represent COVID19. This does not seem consistent with a bacterial infection. Out of an abundance of caution I discussed self quarantine for at least a total of 7 days of illness with at least 3 days of being afebrile with out use of antipyretics and improvement in symptoms prior to breaking the quarantine. Discussed anyone that lives with her should ideally quarantine themselves and work from home if possible though if not possible should be wearing a mask and checking temperatures daily and if they develop symptoms need to quarantine and contact their physician. Advised that we would recheck her on Friday to complete a follow-up visit. Given reasons to seek medical attention in the ED.    I discussed the assessment and treatment plan with the patient. The patient was provided an opportunity to ask questions and all were answered. The patient agreed with the plan and demonstrated an understanding of the instructions.   The patient was advised to call back or seek an in-person evaluation if the symptoms worsen or if the condition fails to improve as anticipated.  I provided 13 minutes of non-face-to-face time during this encounter.   [name], MD

## 2018-12-20 NOTE — Progress Notes (Signed)
Pt is c/o a cough with clear mucus that started 5 days ago, mucus feel sstick in throat, chest feels congested. No other signs or symptoms.   No fever, NO SOB, NO travel outside the country in past 3-4 month, No to being on a plane in the past 3-4 month, No to being exposed to anyone with COVID-19 who has tested positive.

## 2018-12-22 DIAGNOSIS — J989 Respiratory disorder, unspecified: Secondary | ICD-10-CM | POA: Insufficient documentation

## 2018-12-22 NOTE — Assessment & Plan Note (Signed)
I suspect the patient has a viral URI or bronchitis. I did discuss that there is a small chance that this could represent COVID19. This does not seem consistent with a bacterial infection. Out of an abundance of caution I discussed self quarantine for at least a total of 7 days of illness with at least 3 days of being afebrile with out use of antipyretics and improvement in symptoms prior to breaking the quarantine. Discussed anyone that lives with her should ideally quarantine themselves and work from home if possible though if not possible should be wearing a mask and checking temperatures daily and if they develop symptoms need to quarantine and contact their physician. Advised that we would recheck her on Friday to complete a follow-up visit. Given reasons to seek medical attention in the ED.

## 2018-12-23 ENCOUNTER — Other Ambulatory Visit: Payer: Self-pay

## 2018-12-23 ENCOUNTER — Encounter: Payer: Self-pay | Admitting: Family Medicine

## 2018-12-23 ENCOUNTER — Ambulatory Visit (INDEPENDENT_AMBULATORY_CARE_PROVIDER_SITE_OTHER): Payer: Medicare PPO | Admitting: Family Medicine

## 2018-12-23 DIAGNOSIS — R911 Solitary pulmonary nodule: Secondary | ICD-10-CM | POA: Diagnosis not present

## 2018-12-23 DIAGNOSIS — J309 Allergic rhinitis, unspecified: Secondary | ICD-10-CM | POA: Diagnosis not present

## 2018-12-23 DIAGNOSIS — N1330 Unspecified hydronephrosis: Secondary | ICD-10-CM | POA: Diagnosis not present

## 2018-12-23 DIAGNOSIS — N1339 Other hydronephrosis: Secondary | ICD-10-CM

## 2018-12-23 DIAGNOSIS — J989 Respiratory disorder, unspecified: Secondary | ICD-10-CM | POA: Diagnosis not present

## 2018-12-23 MED ORDER — MONTELUKAST SODIUM 10 MG PO TABS: 10.0000 mg | ORAL_TABLET | Freq: Every day | ORAL | 1 refills | Status: DC

## 2018-12-23 NOTE — Progress Notes (Signed)
Virtual Visit via telephone note  This visit type was conducted due to national recommendations for restrictions regarding the COVID-19 pandemic (e.g. social distancing).  This format is felt to be most appropriate for this patient at this time.  All issues noted in this document were discussed and addressed.  No physical exam was performed (except for noted visual exam findings with Video Visits).   I connected with Jill Snow today at  3:30 PM EDT by telephone and verified that I am speaking with the correct person using two identifiers. Location patient: home Location provider: work or home office Persons participating in the virtual visit: patient, provider  I discussed the limitations, risks, security and privacy concerns of performing an evaluation and management service by telephone and the availability of in person appointments. I also discussed with the patient that there may be a patient responsible charge related to this service. The patient expressed understanding and agreed to proceed.  Interactive audio and video telecommunications were attempted between this provider and patient, however failed, due to patient having technical difficulties OR patient did not have access to video capability.  We continued and completed visit with audio only.  Reason for visit: follow-up  HPI: Respiratory illness: patient notes she is feeling better. Her cough has improved quite a bit. She has some mucus in her throat. She notes she has not taken any tylenol in the past 2 months and notes she was incorrect earlier this week. She reports her symptoms have been sneezing, rhinorrhea, and throat congestion all of which is improving. No postnasal drip or ear symptoms. She has been off her singulair.   Hydronephrosis: she did not see urology. She no showed her appointment. No hematuria or dysuria.   Lung nodule: she did note have her CT chest completed. She notes no weight loss. Currently weighs  175 lbs. No hemoptysis.    ROS: See pertinent positives and negatives per HPI.  Past Medical History:  Diagnosis Date  . Atrial fibrillation ([place])   . Atrial flutter ([place])    a. s/p successful TEE/DCCV in 2014; b. TEE 2014 showed EF 45-50%, mild bi-atrial enlargement, mod MR  . Dyspnea   . History of chicken pox   . Hypercholesterolemia   . Hypertension   . Mitral regurgitation    a. echo 2014: EF 40-45%, mildly dilated RV, mod reduced RV systolic fxn, mod dilated LA, Mild to mod MR, mod TR, mildly elevated PASP  . PAF (paroxysmal atrial fibrillation) ([place])    a. initial episode 2011; b. not on long term anticoagulation since 06/2013  . RBBB (right bundle branch block)   . Seasonal allergies   . Sleep apnea    a. on CPAP    Past Surgical History:  Procedure Laterality Date  . ABDOMINAL HYSTERECTOMY    . TONSILLECTOMY AND ADENOIDECTOMY  1947  . tummy tuck      Family History  Problem Relation Age of Onset  . Stroke Mother   . Heart attack Mother   . Arthritis Mother   . Hyperlipidemia Mother   . Heart disease Mother   . Diabetes Mother   . Diabetes Sister   . Cancer Daughter   . Heart disease Daughter   . Diabetes Daughter   . Diabetes Brother   . Diabetes Son   . Diabetes Maternal Grandmother   . Diabetes Maternal Grandfather     SOCIAL HX: nonsmoker   Current Outpatient Medications:  .  atorvastatin (LIPITOR) 40 MG tablet,  Take 1 tablet (40 mg total) by mouth daily., Disp: 90 tablet, Rfl: 0 .  [employer] D3 50 MCG (2000 UT) CAPS, TAKE 1 CAPSULE EVERY DAY, Disp: 100 capsule, Rfl: 3 .  fluticasone (FLONASE) 50 MCG/ACT nasal spray, Place 2 sprays into both nostrils daily., Disp: 16 g, Rfl: 6 .  furosemide (LASIX) 20 MG tablet, Take lasix 20mg  once daily for 3 days, then USE ONCE DAILY AS NEEDED if having fluid build up in legs., Disp: 20 tablet, Rfl: 1 .  IBU 400 MG tablet, TAKE ONE TABLET BY MOUTH EVERY 8 HOURS AS NEEDED FOR MILD PAIN, Disp: 30 tablet, Rfl: 0 .   montelukast (SINGULAIR) 10 MG tablet, Take 1 tablet (10 mg total) by mouth daily., Disp: 90 tablet, Rfl: 1 .  vitamin B-12 (CYANOCOBALAMIN) 100 MCG tablet, Take 1 tablet (100 mcg total) by mouth daily. Over the counter., Disp: 30 tablet, Rfl: 0 .  Vitamin D, Ergocalciferol, (DRISDOL) 1.25 MG (50000 UT) CAPS capsule, TAKE 1 CAPSULE BY MOUTH EVERY 7 DAYS, Disp: 4 capsule, Rfl: 0  EXAM: This was a telehealth telephone visit and thus no physical exam was completed.  ASSESSMENT AND PLAN:  Discussed the following assessment and plan:  Allergic rhinitis - Plan: montelukast (SINGULAIR) 10 MG tablet  Respiratory illness  Other hydronephrosis  Lung nodule  Solitary pulmonary nodule - Plan: CT Chest Wo Contrast  Hydronephrosis, unspecified hydronephrosis type - Plan: CT Abdomen Pelvis W Contrast  Respiratory illness Based on description of symptoms today I believe this is likely allergies.  She has been improving and has had symptoms for greater than 7 days with no fever and no use of antipyretics over the last 3 days.  Discussed restarting Singulair.  Advised she could start Claritin over-the-counter.  She will continue Flonase.  She will monitor her symptoms for worsening.  Right hydronephrosis Given duration since her prior scan we will repeat a scan.  Discussed the need to have this completed.  Lung nodule We will reorder her scan.  She did not have this completed previously.  I discussed the importance of having this done and the risk that there could be a lung nodule or a cancer that needs to be evaluated further.   CMA will contact patient to schedule follow-up in 3 months.   I discussed the assessment and treatment plan with the patient. The patient was provided an opportunity to ask questions and all were answered. The patient agreed with the plan and demonstrated an understanding of the instructions.   The patient was advised to call back or seek an in-person evaluation if the  symptoms worsen or if the condition fails to improve as anticipated.  I provided 23 minutes of non-face-to-face time during this encounter.   [name], MD

## 2018-12-24 ENCOUNTER — Other Ambulatory Visit: Payer: Self-pay | Admitting: Family Medicine

## 2018-12-24 ENCOUNTER — Telehealth: Payer: Self-pay | Admitting: Family Medicine

## 2018-12-24 NOTE — Assessment & Plan Note (Signed)
Based on description of symptoms today I believe this is likely allergies.  She has been improving and has had symptoms for greater than 7 days with no fever and no use of antipyretics over the last 3 days.  Discussed restarting Singulair.  Advised she could start Claritin over-the-counter.  She will continue Flonase.  She will monitor her symptoms for worsening.

## 2018-12-24 NOTE — Assessment & Plan Note (Addendum)
Given duration since her prior scan we will repeat a scan.  Discussed the need to have this completed.

## 2018-12-24 NOTE — Telephone Encounter (Signed)
Please contact the patient and get her set up for follow-up in the office in 3 months.  Thanks.

## 2018-12-24 NOTE — Assessment & Plan Note (Signed)
We will reorder her scan.  She did not have this completed previously.  I discussed the importance of having this done and the risk that there could be a lung nodule or a cancer that needs to be evaluated further.

## 2018-12-26 NOTE — Telephone Encounter (Signed)
Vit D last checked 05/16/2018 at 36/10 low side of normal   Will she need labs to have this sent in?  Sent to PCP for approval

## 2018-12-28 ENCOUNTER — Ambulatory Visit: Payer: Medicare PPO | Admitting: Family Medicine

## 2018-12-30 NOTE — Telephone Encounter (Signed)
appointment has been made

## 2019-01-11 ENCOUNTER — Ambulatory Visit: Payer: Medicare PPO

## 2019-03-02 ENCOUNTER — Other Ambulatory Visit: Payer: Self-pay

## 2019-03-02 MED ORDER — ATORVASTATIN CALCIUM 40 MG PO TABS: 40.0000 mg | ORAL_TABLET | Freq: Every day | ORAL | 1 refills | Status: DC

## 2019-03-02 NOTE — Progress Notes (Signed)
Called patient to confirmed taking cholesterol medication. Confirmed that is taking it but was not taking regularly. I have sent in refill for her per request.

## 2019-03-31 ENCOUNTER — Ambulatory Visit: Payer: Medicare PPO | Admitting: Family Medicine

## 2019-05-19 ENCOUNTER — Ambulatory Visit (INDEPENDENT_AMBULATORY_CARE_PROVIDER_SITE_OTHER): Payer: Medicare PPO

## 2019-05-19 ENCOUNTER — Other Ambulatory Visit: Payer: Self-pay

## 2019-05-19 ENCOUNTER — Ambulatory Visit: Payer: Medicare PPO | Admitting: Family Medicine

## 2019-05-19 DIAGNOSIS — Z0000 Encounter for general adult medical examination without abnormal findings: Secondary | ICD-10-CM | POA: Diagnosis not present

## 2019-05-19 NOTE — Progress Notes (Signed)
Subjective:   Jill Snow is a 85 y.o. female who presents for Medicare Annual (Subsequent) preventive examination.  Review of Systems:  No ROS.  Medicare Wellness Virtual Visit.  Visual/audio telehealth visit, UTA vital signs.   See social history for additional risk factors.   Cardiac Risk Factors include: advanced age (>23men, >36 women);hypertension     Objective:     Vitals: There were no vitals taken for this visit.  There is no height or weight on file to calculate BMI.  Advanced Directives 05/19/2019 05/16/2018 05/16/2018 06/09/2017 11/30/2016 10/27/2016 03/30/2015  Does Patient Have a Medical Advance Directive? No No No Yes No No Yes  Type of Advance Directive - - - - - - Press photographer;Living will  Would patient like information on creating a medical advance directive? No - Patient declined No - Patient declined Yes (MAU/Ambulatory/Procedural Areas - Information given) - No - Patient declined Yes (ED - Information included in AVS) -    Tobacco Social History   Tobacco Use  Smoking Status Never Smoker  Smokeless Tobacco Never Used     Counseling given: Not Answered   Clinical Intake:  Pre-visit preparation completed: Yes        Diabetes: No  How often do you need to have someone help you when you read instructions, pamphlets, or other written materials from your doctor or pharmacy?: 3 - Sometimes  Interpreter Needed?: No     Past Medical History:  Diagnosis Date  . Atrial fibrillation ([place])   . Atrial flutter ([place])    a. s/p successful TEE/DCCV in 2014; b. TEE 2014 showed EF 45-50%, mild bi-atrial enlargement, mod MR  . Dyspnea   . History of chicken pox   . Hypercholesterolemia   . Hypertension   . Mitral regurgitation    a. echo 2014: EF 40-45%, mildly dilated RV, mod reduced RV systolic fxn, mod dilated LA, Mild to mod MR, mod TR, mildly elevated PASP  . PAF (paroxysmal atrial fibrillation) ([place])    a. initial episode 2011;  b. not on long term anticoagulation since 06/2013  . RBBB (right bundle branch block)   . Seasonal allergies   . Sleep apnea    a. on CPAP   Past Surgical History:  Procedure Laterality Date  . ABDOMINAL HYSTERECTOMY    . TONSILLECTOMY AND ADENOIDECTOMY  1947  . tummy tuck     Family History  Problem Relation Age of Onset  . Stroke Mother   . Heart attack Mother   . Arthritis Mother   . Hyperlipidemia Mother   . Heart disease Mother   . Diabetes Mother   . Diabetes Sister   . Cancer Daughter   . Heart disease Daughter   . Diabetes Daughter   . Diabetes Brother   . Diabetes Son   . Diabetes Maternal Grandmother   . Diabetes Maternal Grandfather    Social History   Socioeconomic History  . Marital status: Widowed    Spouse name: Not on file  . Number of children: 2  . Years of education: Not on file  . Highest education level: Not on file  Occupational History  . Occupation: REALTOR    Employer: [employer]  Social Needs  . Financial resource strain: Not hard at all  . Food insecurity    Worry: Never true    Inability: Never true  . Transportation needs    Medical: No    Non-medical: No  Tobacco Use  .  Smoking status: Never Smoker  . Smokeless tobacco: Never Used  Substance and Sexual Activity  . Alcohol use: No  . Drug use: No  . Sexual activity: Never  Lifestyle  . Physical activity    Days per week: 0 days    Minutes per session: Not on file  . Stress: Not at all  Relationships  . Social Herbalist on phone: Not on file    Gets together: Not on file    Attends religious service: Not on file    Active member of club or organization: Not on file    Attends meetings of clubs or organizations: Not on file    Relationship status: Not on file  Other Topics Concern  . Not on file  Social History Narrative  . Not on file    Outpatient Encounter Medications as of 05/19/2019  Medication Sig  . atorvastatin (LIPITOR) 40 MG tablet Take 1  tablet (40 mg total) by mouth daily.  . CVS D3 50 MCG (2000 UT) CAPS TAKE 1 CAPSULE EVERY DAY  . fluticasone (FLONASE) 50 MCG/ACT nasal spray Place 2 sprays into both nostrils daily.  . IBU 400 MG tablet TAKE ONE TABLET BY MOUTH EVERY 8 HOURS AS NEEDED FOR MILD PAIN  . montelukast (SINGULAIR) 10 MG tablet Take 1 tablet (10 mg total) by mouth daily.  . vitamin B-12 (CYANOCOBALAMIN) 100 MCG tablet Take 1 tablet (100 mcg total) by mouth daily. Over the counter.  . Vitamin D, Ergocalciferol, (DRISDOL) 1.25 MG (50000 UT) CAPS capsule TAKE 1 CAPSULE BY MOUTH ONE TIME PER WEEK  . furosemide (LASIX) 20 MG tablet Take lasix 20mg  once daily for 3 days, then USE ONCE DAILY AS NEEDED if having fluid build up in legs. (Patient not taking: Reported on 05/19/2019)   No facility-administered encounter medications on file as of 05/19/2019.     Activities of Daily Living In your present state of health, do you have any difficulty performing the following activities: 05/19/2019  Hearing? N  Vision? N  Difficulty concentrating or making decisions? Y  Walking or climbing stairs? N  Dressing or bathing? N  Doing errands, shopping? N  Preparing Food and eating ? N  Using the Toilet? N  In the past six months, have you accidently leaked urine? N  Do you have problems with loss of bowel control? N  Managing your Medications? N  Managing your Finances? Y  Comment Son Engineer, production or managing your Housekeeping? N  Some recent data might be hidden    Patient Care Team: Leone Haven, MD as PCP - General (Family Medicine) Minna Merritts, MD as Consulting Physician (Cardiology)    Assessment:   This is a routine wellness examination for Jill Snow.  I connected with patient 05/19/19 at  9:00 AM EDT by an audio enabled telemedicine application and verified that I am speaking with the correct person using two identifiers. Patient stated full name and DOB. Patient gave permission to continue with  virtual visit. Patient's location was at home and Nurse's location was at [place] office.   Health Maintenance Due: -Influenza vaccine 2020- discussed; to be completed in season with doctor or local pharmacy.   -Dexa Scan- deferred due to patient preference Update all pending maintenance due as appropriate.   See completed HM at the end of note.   Eye: Visual acuity not assessed. Virtual visit. Wears corrective lenses. Followed by their ophthalmologist every 12 months.   Dental: Visits every  6 months.    Hearing: Demonstrates normal hearing during visit.  Safety:  Patient feels safe at home- yes Patient does have smoke detectors at home- yes Patient does wear sunscreen or protective clothing when in direct sunlight - yes Patient does wear seat belt when in a moving vehicle - yes Patient drives- yes Adequate lighting in walkways free from debris- yes Grab bars and handrails used as appropriate- yes Ambulates with no assistive device Cell phone on person when ambulating outside of the home- yes  Social: Alcohol intake - no  Smoking history- never  Smokers in home? none Illicit drug use? none  Depression: PHQ 2 &9 complete. See screening below. Denies irritability, anhedonia, sadness/tearfullness.  Stable.   Falls: See screening below.    Medication: Taking as directed and without issues.   Covid-19: Precautions and sickness symptoms discussed. Wears mask, social distancing, hand hygiene as appropriate.   Activities of Daily Living Patient denies needing assistance with: household chores, feeding themselves, getting from bed to chair, getting to the toilet, bathing/showering, dressing, managing money, or preparing meals.   Memory: Patient is alert. Lives with son.  Correctly identified the president of the [place]. Incorrectly identified the current month and year.  Declined all other MMS and 6CIT questions.  Patient likes to read for brain stimulation.  BMI-  discussed the importance of a healthy diet, water intake and the benefits of aerobic exercise.  Educational material provided.  Physical activity- no routine  Diet: Regular Water: good intake Caffeine:   Other Providers Patient Care Team: Leone Haven, MD as PCP - General (Family Medicine) Minna Merritts, MD as Consulting Physician (Cardiology)  Exercise Activities and Dietary recommendations Current Exercise Habits: The patient does not participate in regular exercise at present  Goals      Patient Stated   . Increase physical activity (pt-stated)     Start [facility] days weekly, 30 minutes Brain engagement exercises (reading, crossword puzzles etc...)       Fall Risk Fall Risk  05/19/2019 05/16/2018 12/10/2016 08/13/2015 07/08/2015  Falls in the past year? 0 No No Yes No  Number falls in past yr: - - - 1 -  Injury with Fall? - - - No -   Timed Get Up and Go performed: no, virtual visit  Depression Screen PHQ 2/9 Scores 05/19/2019 05/16/2018 12/10/2016 08/13/2015  PHQ - 2 Score 0 0 0 0  PHQ- 9 Score - - 0 -     Cognitive Function     6CIT Screen 05/16/2018  What Year? 4 points  What month? 3 points  What time? 0 points  Count back from 20 0 points  Months in reverse 4 points  Repeat phrase 2 points  Total Score 13    Immunization History  Administered Date(s) Administered  . Hep A / Hep B 04/22/2016, 05/27/2016  . Influenza Split 05/31/2013  . Influenza, High Dose Seasonal PF 06/21/2017, 05/18/2018  . Influenza,inj,Quad PF,6+ Mos 05/15/2014, 06/14/2015, 04/22/2016  . Pneumococcal Conjugate-13 06/14/2015  . Pneumococcal Polysaccharide-23 07/18/2013  . Tdap 01/02/2014, 06/17/2018   Screening Tests Health Maintenance  Topic Date Due  . INFLUENZA VACCINE  11/15/2019 (Originally 03/18/2019)  . DEXA SCAN  05/18/2020 (Originally 11/16/2000)  . TETANUS/TDAP  06/17/2028  . PNA vac Low Risk Adult  Completed      Plan:    Keep  all routine maintenance appointments.   Follow up today at 930 with your doctor.  Medicare Attestation  I have personally reviewed: The patient's medical and social history Their use of alcohol, tobacco or illicit drugs Their current medications and supplements The patient's functional ability including ADLs,fall risks, home safety risks, cognitive, and hearing and visual impairment Diet and physical activities Evidence for depression   In addition, I have reviewed and discussed with patient certain preventive protocols, quality metrics, and best practice recommendations. A written personalized care plan for preventive services as well as general preventive health recommendations were provided to patient via mail.     Varney Biles, LPN  [phone]

## 2019-05-19 NOTE — Progress Notes (Signed)
I have reviewed the above note and agree.  Ellanora Rayborn, M.D.  

## 2019-05-19 NOTE — Patient Instructions (Signed)
  Ms. Vandevander , Thank you for taking time to come for your Medicare Wellness Visit. I appreciate your ongoing commitment to your health goals. Please review the following plan we discussed and let me know if I can assist you in the future.   These are the goals we discussed: Goals      Patient Stated   . Increase physical activity (pt-stated)     [facility] weekly, 30 minutes Brain engagement exercises (reading, crossword puzzles etc...)       This is a list of the screening recommended for you and due dates:  Health Maintenance  Topic Date Due  . Flu Shot  11/15/2019*  . DEXA scan (bone density measurement)  05/18/2020*  . Tetanus Vaccine  06/17/2028  . Pneumonia vaccines  Completed  *Topic was postponed. The date shown is not the original due date.

## 2019-05-22 ENCOUNTER — Ambulatory Visit (INDEPENDENT_AMBULATORY_CARE_PROVIDER_SITE_OTHER): Payer: Medicare PPO | Admitting: Family Medicine

## 2019-05-22 ENCOUNTER — Other Ambulatory Visit: Payer: Self-pay

## 2019-05-22 ENCOUNTER — Encounter: Payer: Self-pay | Admitting: Family Medicine

## 2019-05-22 VITALS — Ht 66.0 in | Wt 159.0 lb

## 2019-05-22 DIAGNOSIS — J309 Allergic rhinitis, unspecified: Secondary | ICD-10-CM

## 2019-05-22 DIAGNOSIS — N1330 Unspecified hydronephrosis: Secondary | ICD-10-CM | POA: Diagnosis not present

## 2019-05-22 DIAGNOSIS — E7800 Pure hypercholesterolemia, unspecified: Secondary | ICD-10-CM

## 2019-05-22 DIAGNOSIS — R911 Solitary pulmonary nodule: Secondary | ICD-10-CM

## 2019-05-22 IMAGING — US US THYROID BIOPSY
1 series · 12 of 12 positions shown · non-contrast
Comparison: Thyroid ultrasound 04/09/2017

MEDICATIONS:
None

COMPLICATIONS:
None immediate.

INDICATION: Indeterminate thyroid nodule

EXAM:
ULTRASOUND GUIDED FINE NEEDLE ASPIRATION OF INDETERMINATE THYROID
NODULE
TECHNIQUE: Informed written consent was obtained from the patient after a
discussion of the risks, benefits and alternatives to treatment.
Questions regarding the procedure were encouraged and answered. A
timeout was performed prior to the initiation of the procedure.

[Series 1: us thyroid biopsy · 0.06mm/px · 12 of 12 slices shown]
[im 1/12]
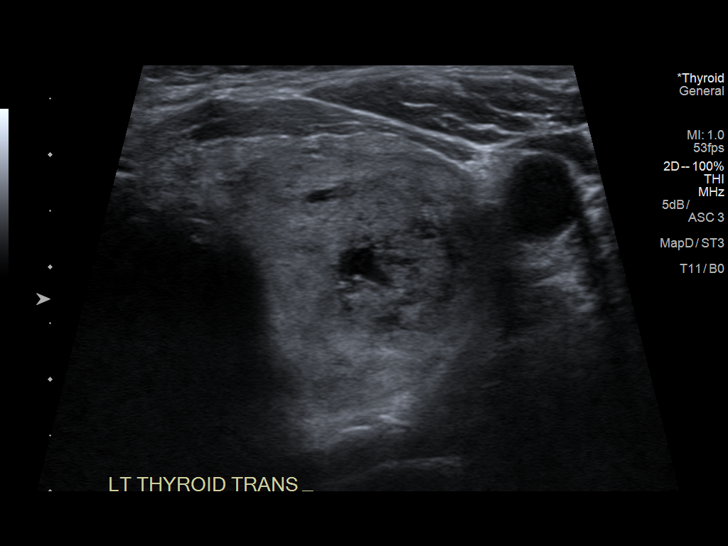
[im 2/12]
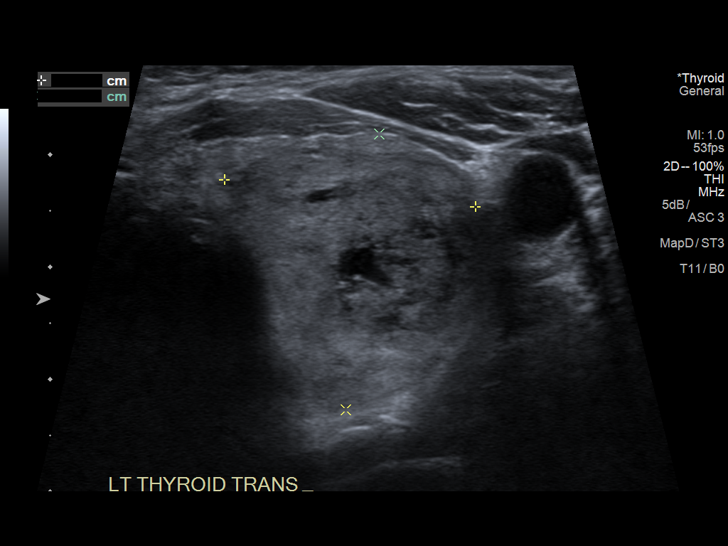
[im 3/12]
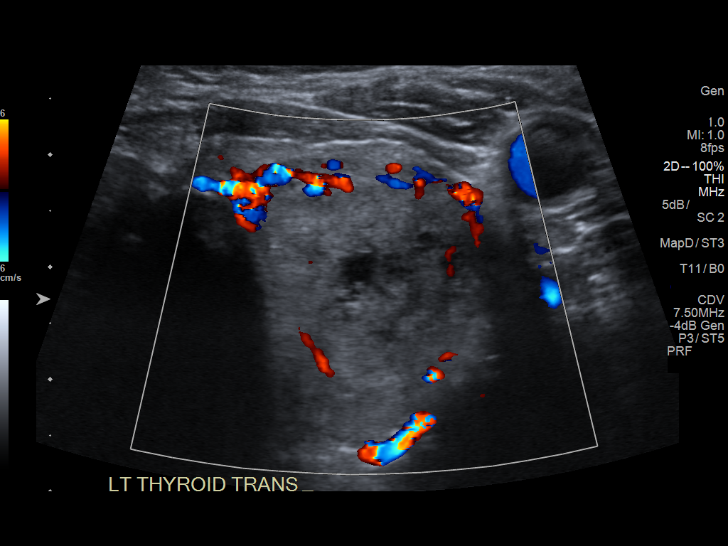
[im 4/12]
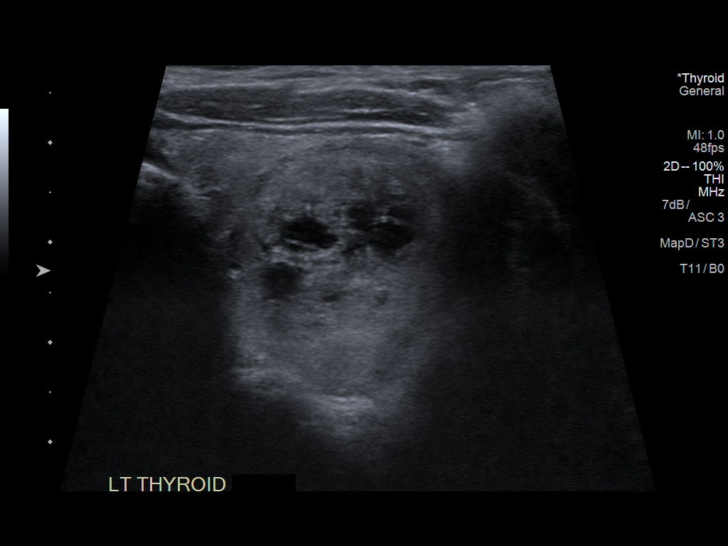
[im 5/12]
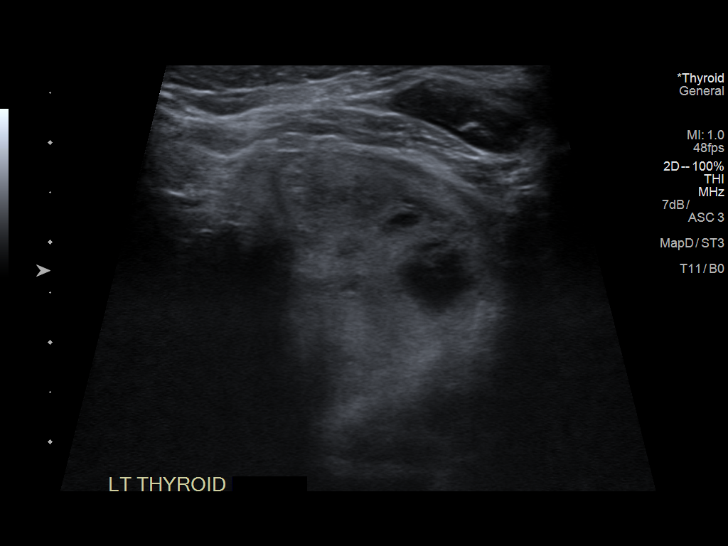
[im 6/12]
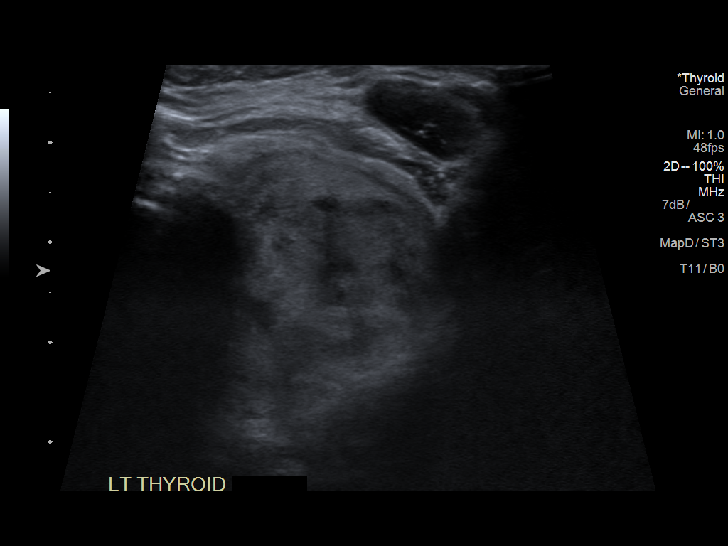
[im 7/12]
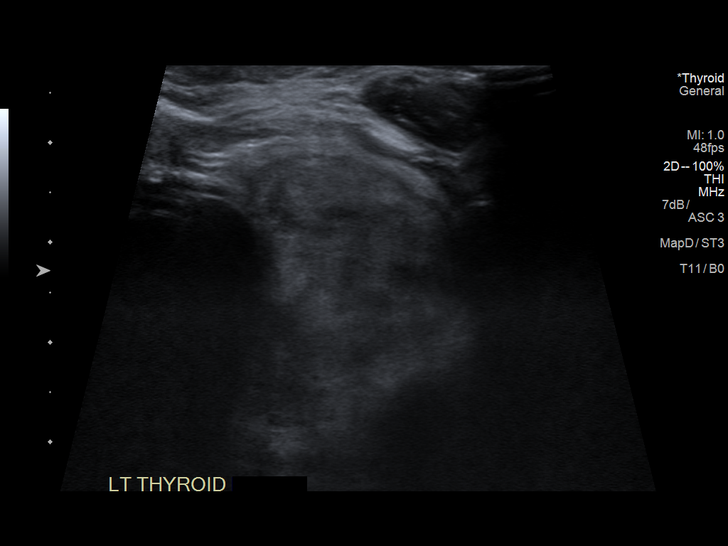
[im 8/12]
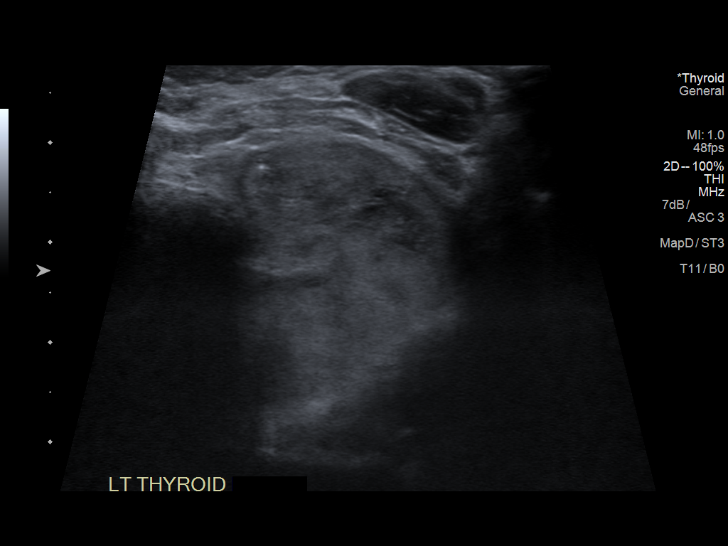
[im 9/12]
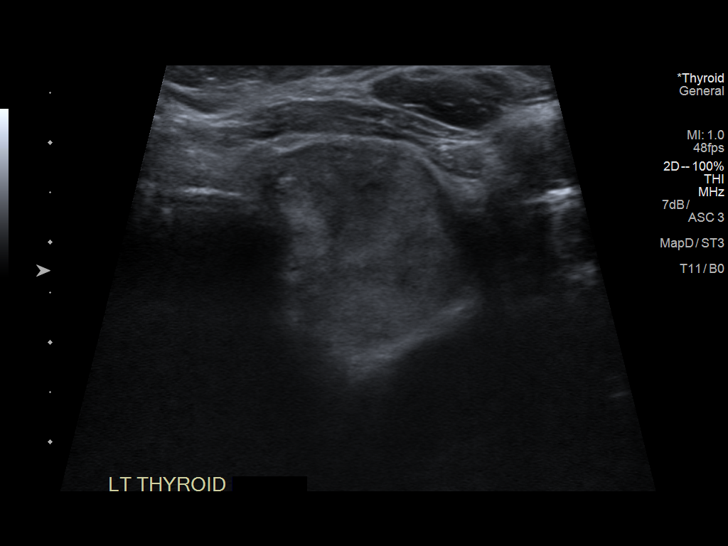
[im 10/12]
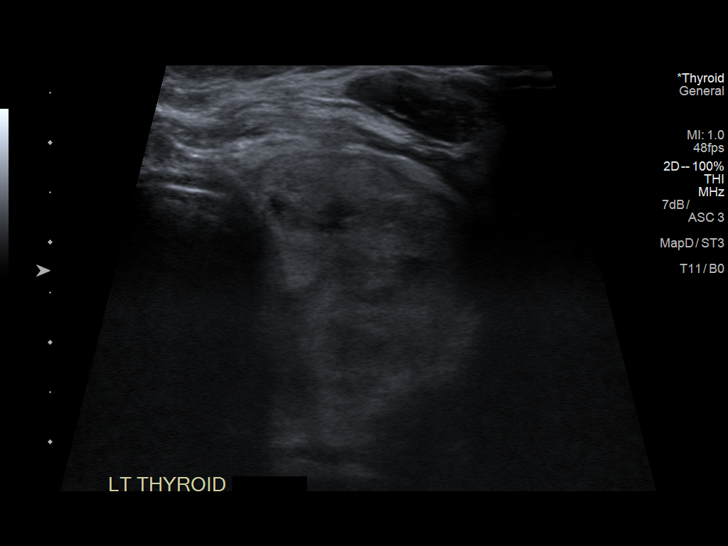
[im 11/12]
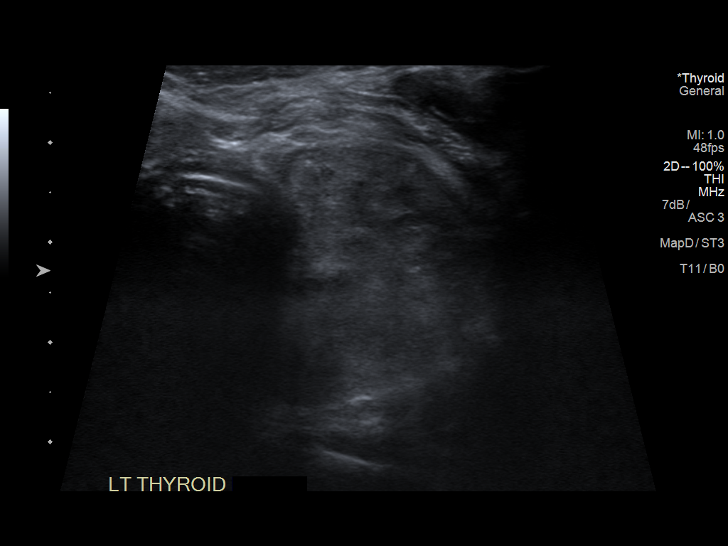
[im 12/12]
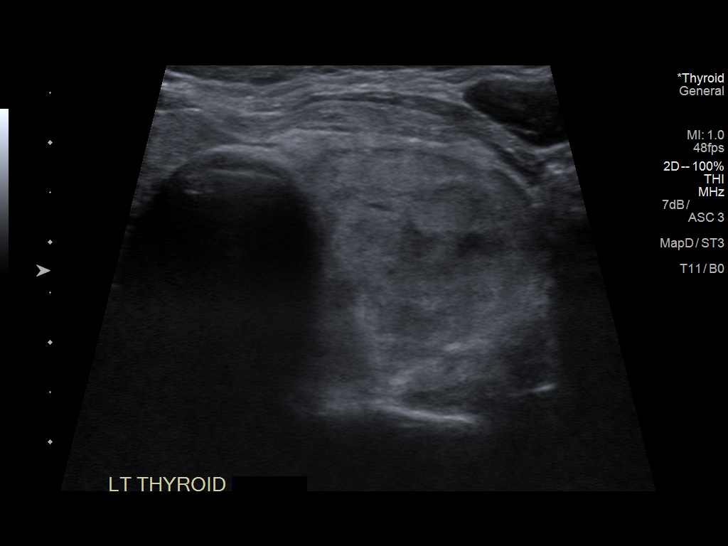

[12 of 12 positions shown; findings below may reference images not displayed]

Pre-procedural ultrasound scanning demonstrated unchanged size and
appearance of the indeterminate nodule within the left thyroid lobe.

The procedure was planned. The neck was prepped in the usual sterile
fashion, and a sterile drape was applied covering the operative
field. A timeout was performed prior to the initiation of the
procedure. Local anesthesia was provided with 1% lidocaine.

Under direct ultrasound guidance, 7 FNA biopsies were performed of
the left thyroid nodule with 25 gauge needles. Two of the FNAs were
obtained for Afirma. Multiple ultrasound images were saved for
procedural documentation purposes. The samples were prepared and
submitted to pathology.

Limited post procedural scanning was negative for hematoma or
additional complication. Dressings were placed. The patient
tolerated the above procedures procedure well without immediate
postprocedural complication.
FINDINGS: Nodule reference number based on prior diagnostic ultrasound: 1

Maximum size: 2.6 cm

Location: Left; Mid

ACR TI-RADS risk category: TR3 (3 points)

Reason for biopsy: meets ACR TI-RADS criteria

Ultrasound imaging confirms appropriate placement of the needles
within the thyroid nodule.
IMPRESSION: Technically successful ultrasound guided fine needle aspiration of
the left thyroid nodule.

## 2019-05-22 MED ORDER — FLUTICASONE PROPIONATE 50 MCG/ACT NA SUSP: 2.0000 | Freq: Every day | NASAL | 6 refills | Status: DC

## 2019-05-22 MED ORDER — ATORVASTATIN CALCIUM 40 MG PO TABS: 40.0000 mg | ORAL_TABLET | Freq: Every day | ORAL | 1 refills | Status: DC

## 2019-05-22 NOTE — Progress Notes (Signed)
Virtual Visit via telephone Note  This visit type was conducted due to national recommendations for restrictions regarding the COVID-19 pandemic (e.g. social distancing).  This format is felt to be most appropriate for this patient at this time.  All issues noted in this document were discussed and addressed.  No physical exam was performed (except for noted visual exam findings with Video Visits).   I connected with Jill Snow today at  3:15 PM EDT by telephone and verified that I am speaking with the correct person using two identifiers. Location patient: home Location provider: work Persons participating in the virtual visit: patient, provider  I discussed the limitations, risks, security and privacy concerns of performing an evaluation and management service by telephone and the availability of in person appointments. I also discussed with the patient that there may be a patient responsible charge related to this service. The patient expressed understanding and agreed to proceed.  Interactive audio and video telecommunications were attempted between this provider and patient, however failed, due to patient having technical difficulties OR patient did not have access to video capability.  We continued and completed visit with audio only.  Reason for visit: follow-up  HPI: Throat congestion: Patient notes this has been going on for the last 6 weeks or so.  She notes no sinus congestion or nasal congestion.  No coughing, fever, shortness of breath, COVID-19 exposure, postnasal drip, or other symptoms.  She notes she has been gargling with salt water.  She ran out of her Singulair and Flonase.  She does not feel sick.  Hyperlipidemia: She has been out of her Lipitor.    ROS: See pertinent positives and negatives per HPI.  Past Medical History:  Diagnosis Date  . Atrial fibrillation ([place])   . Atrial flutter ([place])    a. s/p successful TEE/DCCV in 2014; b. TEE 2014 showed EF  45-50%, mild bi-atrial enlargement, mod MR  . Dyspnea   . History of chicken pox   . Hypercholesterolemia   . Hypertension   . Mitral regurgitation    a. echo 2014: EF 40-45%, mildly dilated RV, mod reduced RV systolic fxn, mod dilated LA, Mild to mod MR, mod TR, mildly elevated PASP  . PAF (paroxysmal atrial fibrillation) ([place])    a. initial episode 2011; b. not on long term anticoagulation since 06/2013  . RBBB (right bundle branch block)   . Seasonal allergies   . Sleep apnea    a. on CPAP    Past Surgical History:  Procedure Laterality Date  . ABDOMINAL HYSTERECTOMY    . TONSILLECTOMY AND ADENOIDECTOMY  1947  . tummy tuck      Family History  Problem Relation Age of Onset  . Stroke Mother   . Heart attack Mother   . Arthritis Mother   . Hyperlipidemia Mother   . Heart disease Mother   . Diabetes Mother   . Diabetes Sister   . Cancer Daughter   . Heart disease Daughter   . Diabetes Daughter   . Diabetes Brother   . Diabetes Son   . Diabetes Maternal Grandmother   . Diabetes Maternal Grandfather     SOCIAL HX: Non-smoker.   Current Outpatient Medications:  .  atorvastatin (LIPITOR) 40 MG tablet, Take 1 tablet (40 mg total) by mouth daily., Disp: 90 tablet, Rfl: 1 .  [employer] D3 50 MCG (2000 UT) CAPS, TAKE 1 CAPSULE EVERY DAY, Disp: 100 capsule, Rfl: 3 .  IBU 400 MG tablet, TAKE ONE  TABLET BY MOUTH EVERY 8 HOURS AS NEEDED FOR MILD PAIN, Disp: 30 tablet, Rfl: 0 .  montelukast (SINGULAIR) 10 MG tablet, Take 1 tablet (10 mg total) by mouth daily., Disp: 90 tablet, Rfl: 1 .  vitamin E 400 UNIT capsule, Take 400 Units by mouth daily., Disp: , Rfl:  .  fluticasone (FLONASE) 50 MCG/ACT nasal spray, Place 2 sprays into both nostrils daily., Disp: 16 g, Rfl: 6  EXAM: This was a telehealth telephone visit notes no physical exam was completed.  ASSESSMENT AND PLAN:  Discussed the following assessment and plan:  Allergic rhinitis Patient symptoms are likely related to  allergic rhinitis.  We will refill her Flonase.  If not improving we can have her go back on Singulair.  Right hydronephrosis The patient never completed evaluation for this with CT imaging.  This will be reordered.  I discussed the importance of having this completed.  Lung nodule The patient never completed follow-up imaging for this.  This will be reordered.  I discussed the importance of having this completed.  Hypercholesterolemia Lipitor refilled.    I discussed the assessment and treatment plan with the patient. The patient was provided an opportunity to ask questions and all were answered. The patient agreed with the plan and demonstrated an understanding of the instructions.   The patient was advised to call back or seek an in-person evaluation if the symptoms worsen or if the condition fails to improve as anticipated.  I provided 11 minutes of non-face-to-face time during this encounter.   [name], MD

## 2019-05-23 NOTE — Assessment & Plan Note (Signed)
The patient never completed follow-up imaging for this.  This will be reordered.  I discussed the importance of having this completed.

## 2019-05-23 NOTE — Assessment & Plan Note (Signed)
Lipitor refilled.

## 2019-05-23 NOTE — Assessment & Plan Note (Signed)
Patient symptoms are likely related to allergic rhinitis.  We will refill her Flonase.  If not improving we can have her go back on Singulair.

## 2019-05-23 NOTE — Assessment & Plan Note (Addendum)
The patient never completed evaluation for this with CT imaging.  This will be reordered.  I discussed the importance of having this completed.

## 2019-06-16 ENCOUNTER — Ambulatory Visit: Admission: RE | Admit: 2019-06-16 | Payer: Medicare PPO | Source: Ambulatory Visit

## 2019-06-23 ENCOUNTER — Ambulatory Visit: Admission: RE | Admit: 2019-06-23 | Payer: Medicare PPO | Source: Ambulatory Visit

## 2019-07-10 ENCOUNTER — Other Ambulatory Visit: Payer: Self-pay | Admitting: Family Medicine

## 2019-07-10 DIAGNOSIS — E7800 Pure hypercholesterolemia, unspecified: Secondary | ICD-10-CM

## 2019-08-06 ENCOUNTER — Other Ambulatory Visit: Payer: Self-pay | Admitting: Family Medicine

## 2019-08-06 DIAGNOSIS — E7800 Pure hypercholesterolemia, unspecified: Secondary | ICD-10-CM

## 2019-08-06 DIAGNOSIS — J309 Allergic rhinitis, unspecified: Secondary | ICD-10-CM

## 2019-08-06 MED ORDER — ATORVASTATIN CALCIUM 40 MG PO TABS: 40.0000 mg | ORAL_TABLET | Freq: Every day | ORAL | 1 refills | Status: DC

## 2019-08-06 MED ORDER — FLUTICASONE PROPIONATE 50 MCG/ACT NA SUSP: 2.0000 | Freq: Every day | NASAL | 6 refills | Status: DC

## 2019-08-06 MED ORDER — MONTELUKAST SODIUM 10 MG PO TABS: 10.0000 mg | ORAL_TABLET | Freq: Every day | ORAL | 1 refills | Status: DC

## 2019-12-22 DIAGNOSIS — R110 Nausea: Secondary | ICD-10-CM | POA: Diagnosis not present

## 2019-12-22 DIAGNOSIS — A419 Sepsis, unspecified organism: Secondary | ICD-10-CM | POA: Diagnosis not present

## 2019-12-22 DIAGNOSIS — J189 Pneumonia, unspecified organism: Secondary | ICD-10-CM | POA: Diagnosis not present

## 2019-12-22 DIAGNOSIS — M1611 Unilateral primary osteoarthritis, right hip: Secondary | ICD-10-CM | POA: Diagnosis not present

## 2019-12-22 DIAGNOSIS — M79632 Pain in left forearm: Secondary | ICD-10-CM | POA: Diagnosis not present

## 2019-12-22 DIAGNOSIS — A46 Erysipelas: Secondary | ICD-10-CM | POA: Diagnosis not present

## 2019-12-22 DIAGNOSIS — M25551 Pain in right hip: Secondary | ICD-10-CM | POA: Diagnosis not present

## 2019-12-22 DIAGNOSIS — M79602 Pain in left arm: Secondary | ICD-10-CM | POA: Insufficient documentation

## 2019-12-22 DIAGNOSIS — I10 Essential (primary) hypertension: Secondary | ICD-10-CM | POA: Diagnosis not present

## 2019-12-22 DIAGNOSIS — E039 Hypothyroidism, unspecified: Secondary | ICD-10-CM | POA: Diagnosis not present

## 2019-12-22 DIAGNOSIS — I480 Paroxysmal atrial fibrillation: Secondary | ICD-10-CM | POA: Diagnosis not present

## 2019-12-22 DIAGNOSIS — L03114 Cellulitis of left upper limb: Secondary | ICD-10-CM | POA: Insufficient documentation

## 2019-12-22 DIAGNOSIS — E119 Type 2 diabetes mellitus without complications: Secondary | ICD-10-CM | POA: Diagnosis not present

## 2019-12-22 DIAGNOSIS — Z7901 Long term (current) use of anticoagulants: Secondary | ICD-10-CM | POA: Diagnosis not present

## 2019-12-22 DIAGNOSIS — Z885 Allergy status to narcotic agent status: Secondary | ICD-10-CM | POA: Diagnosis not present

## 2019-12-22 DIAGNOSIS — I700 Atherosclerosis of aorta: Secondary | ICD-10-CM | POA: Diagnosis not present

## 2019-12-22 DIAGNOSIS — R509 Fever, unspecified: Secondary | ICD-10-CM | POA: Diagnosis not present

## 2019-12-22 DIAGNOSIS — L03113 Cellulitis of right upper limb: Secondary | ICD-10-CM | POA: Diagnosis not present

## 2019-12-23 DIAGNOSIS — E785 Hyperlipidemia, unspecified: Secondary | ICD-10-CM | POA: Diagnosis not present

## 2019-12-23 DIAGNOSIS — L03114 Cellulitis of left upper limb: Secondary | ICD-10-CM | POA: Diagnosis not present

## 2019-12-23 DIAGNOSIS — A419 Sepsis, unspecified organism: Secondary | ICD-10-CM | POA: Diagnosis not present

## 2019-12-23 DIAGNOSIS — J189 Pneumonia, unspecified organism: Secondary | ICD-10-CM | POA: Diagnosis not present

## 2019-12-24 DIAGNOSIS — A419 Sepsis, unspecified organism: Secondary | ICD-10-CM | POA: Diagnosis not present

## 2019-12-24 DIAGNOSIS — L03114 Cellulitis of left upper limb: Secondary | ICD-10-CM | POA: Diagnosis not present

## 2019-12-24 DIAGNOSIS — E785 Hyperlipidemia, unspecified: Secondary | ICD-10-CM | POA: Diagnosis not present

## 2019-12-24 DIAGNOSIS — J189 Pneumonia, unspecified organism: Secondary | ICD-10-CM | POA: Diagnosis not present

## 2019-12-25 ENCOUNTER — Telehealth: Payer: Self-pay | Admitting: Cardiovascular Disease

## 2019-12-25 MED ORDER — ACETAMINOPHEN 325 MG PO TABS
650.00 | ORAL_TABLET | ORAL | Status: DC
Start: ? — End: 2019-12-25

## 2019-12-25 MED ORDER — MONTELUKAST SODIUM 10 MG PO TABS
10.00 | ORAL_TABLET | ORAL | Status: DC
Start: 2019-12-24 — End: 2019-12-25

## 2019-12-25 MED ORDER — TETANUS-DIPHTH-ACELL PERTUSSIS 5-2-15.5 LF-MCG/0.5 IM SUSP
0.50 | INTRAMUSCULAR | Status: DC
Start: ? — End: 2019-12-25

## 2019-12-25 MED ORDER — GENERIC EXTERNAL MEDICATION
Status: DC
Start: ? — End: 2019-12-25

## 2019-12-25 MED ORDER — ENOXAPARIN SODIUM 40 MG/0.4ML ~~LOC~~ SOLN
40.00 | SUBCUTANEOUS | Status: DC
Start: 2019-12-25 — End: 2019-12-25

## 2019-12-25 MED ORDER — DOXYCYCLINE MONOHYDRATE 100 MG PO CAPS
100.00 | ORAL_CAPSULE | ORAL | Status: DC
Start: 2019-12-24 — End: 2019-12-25

## 2019-12-25 MED ORDER — FLUTICASONE PROPIONATE 50 MCG/ACT NA SUSP
1.00 | NASAL | Status: DC
Start: 2019-12-25 — End: 2019-12-25

## 2019-12-25 MED ORDER — ATORVASTATIN CALCIUM 40 MG PO TABS
80.00 | ORAL_TABLET | ORAL | Status: DC
Start: 2019-12-24 — End: 2019-12-25

## 2019-12-25 NOTE — Telephone Encounter (Signed)
Spoke with patients daughter per release form and patient was discharged yesterday from hospital in [place]. They recommended she follow up with cardiology regarding afib and blood thickness. Patient has appointment tomorrow with APP and confirmed this with her. She is going to need daughter to accompany her to appointment due to some dementia. Hospital visit was due to some cellulitis which had worsened. She verbalized understanding of our conversation, agreement with plan, and had no further questions at this time.

## 2019-12-25 NOTE — Telephone Encounter (Signed)
Thanks for the update!  Kumar Falwell S Datron Brakebill, NP  

## 2019-12-25 NOTE — Telephone Encounter (Signed)
Patient admitted for cellulitis and infection while on vacation in [place], [place]. Hospital advised to be seen by cardiologist. The hospital also seemed to be worried about a fib and blood thickness

## 2019-12-26 ENCOUNTER — Ambulatory Visit (INDEPENDENT_AMBULATORY_CARE_PROVIDER_SITE_OTHER): Payer: Medicare HMO | Admitting: Family

## 2019-12-26 ENCOUNTER — Other Ambulatory Visit: Payer: Self-pay

## 2019-12-26 ENCOUNTER — Ambulatory Visit (INDEPENDENT_AMBULATORY_CARE_PROVIDER_SITE_OTHER): Payer: Medicare HMO

## 2019-12-26 ENCOUNTER — Encounter: Payer: Self-pay | Admitting: Family

## 2019-12-26 VITALS — BP 140/76 | HR 75 | Ht 66.5 in | Wt 177.1 lb

## 2019-12-26 DIAGNOSIS — I480 Paroxysmal atrial fibrillation: Secondary | ICD-10-CM | POA: Diagnosis not present

## 2019-12-26 DIAGNOSIS — E7800 Pure hypercholesterolemia, unspecified: Secondary | ICD-10-CM

## 2019-12-26 DIAGNOSIS — I4510 Unspecified right bundle-branch block: Secondary | ICD-10-CM | POA: Diagnosis not present

## 2019-12-26 DIAGNOSIS — F0390 Unspecified dementia without behavioral disturbance: Secondary | ICD-10-CM

## 2019-12-26 DIAGNOSIS — I10 Essential (primary) hypertension: Secondary | ICD-10-CM | POA: Diagnosis not present

## 2019-12-26 DIAGNOSIS — I700 Atherosclerosis of aorta: Secondary | ICD-10-CM | POA: Diagnosis not present

## 2019-12-26 DIAGNOSIS — I2510 Atherosclerotic heart disease of native coronary artery without angina pectoris: Secondary | ICD-10-CM

## 2019-12-26 MED ORDER — GENERIC EXTERNAL MEDICATION
Status: DC
Start: ? — End: 2019-12-26

## 2019-12-26 NOTE — Patient Instructions (Signed)
Medication Instructions:  No medication changes today.   *If you need a refill on your cardiac medications before your next appointment, please call your pharmacy*   Lab Work: No lab work ordered today.   If you have labs (blood work) drawn today and your tests are completely normal, you will receive your results only by: Marland Kitchen MyChart Message (if you have MyChart) OR . A paper copy in the mail If you have any lab test that is abnormal or we need to change your treatment, we will call you to review the results.  Testing/Procedures: Please wear the ZIO monitor for 2 weeks.   Follow-Up: At [facility], you and your health needs are our priority.  As part of our continuing mission to provide you with exceptional heart care, we have created designated Provider Care Teams.  These Care Teams include your primary Cardiologist (physician) and Advanced Practice Providers (APPs -  Physician Assistants and Nurse Practitioners) who all work together to provide you with the care you need, when you need it.  We recommend signing up for the patient portal called "MyChart".  Sign up information is provided on this After Visit Summary.  MyChart is used to connect with patients for Virtual Visits (Telemedicine).  Patients are able to view lab/test results, encounter notes, upcoming appointments, etc.  Non-urgent messages can be sent to your provider as well.   To learn more about what you can do with MyChart, go to [email or web address].    Your next appointment:   5-6 week(s)  The format for your next appointment:   In Person  Provider:   You may see Ida Rogue, MD or one of the following Advanced Practice Providers on your designated Care Team:    Murray Hodgkins, NP  Christell Faith, PA-C  Marrianne Mood, PA-C    Other Instructions  Atrial Fibrillation  Atrial fibrillation is a type of heartbeat that is irregular or fast. If you have this condition, your heart beats without any order.  This makes it hard for your heart to pump blood in a normal way. Atrial fibrillation may come and go, or it may become a long-lasting problem. If this condition is not treated, it can put you at higher risk for stroke, heart failure, and other heart problems. What are the causes? This condition may be caused by diseases that damage the heart. They include:  High blood pressure.  Heart failure.  Heart valve disease.  Heart surgery. Other causes include:  Diabetes.  Thyroid disease.  Being overweight.  Kidney disease. Sometimes the cause is not known. What increases the risk? You are more likely to develop this condition if:  You are older.  You smoke.  You exercise often and very hard.  You have a family history of this condition.  You are a man.  You use drugs.  You drink a lot of alcohol.  You have lung conditions, such as emphysema, pneumonia, or COPD.  You have sleep apnea. What are the signs or symptoms? Common symptoms of this condition include:  A feeling that your heart is beating very fast.  Chest pain or discomfort.  Feeling short of breath.  Suddenly feeling light-headed or weak.  Getting tired easily during activity.  Fainting.  Sweating. In some cases, there are no symptoms. How is this treated? Treatment for this condition depends on underlying conditions and how you feel when you have atrial fibrillation. They include:  Medicines to: ? Prevent blood clots. ? Treat heart rate or  heart rhythm problems.  Using devices, such as a pacemaker, to correct heart rhythm problems.  Doing surgery to remove the part of the heart that sends bad signals.  Closing an area where clots can form in the heart (left atrial appendage). In some cases, your doctor will treat other underlying conditions. Follow these instructions at home: Medicines  Take over-the-counter and prescription medicines only as told by your doctor.  Do not take any new  medicines without first talking to your doctor.  If you are taking blood thinners: ? Talk with your doctor before you take any medicines that have aspirin or NSAIDs, such as ibuprofen, in them. ? Take your medicine exactly as told by your doctor. Take it at the same time each day. ? Avoid activities that could hurt or bruise you. Follow instructions about how to prevent falls. ? Wear a bracelet that says you are taking blood thinners. Or, carry a card that lists what medicines you take. Lifestyle      Do not use any products that have nicotine or tobacco in them. These include cigarettes, e-cigarettes, and chewing tobacco. If you need help quitting, ask your doctor.  Eat heart-healthy foods. Talk with your doctor about the right eating plan for you.  Exercise regularly as told by your doctor.  Do not drink alcohol.  Lose weight if you are overweight.  Do not use drugs, including cannabis. General instructions  If you have a condition that causes breathing to stop for a short period of time (apnea), treat it as told by your doctor.  Keep a healthy weight. Do not use diet pills unless your doctor says they are safe for you. Diet pills may make heart problems worse.  Keep all follow-up visits as told by your doctor. This is important. Contact a doctor if:  You notice a change in the speed, rhythm, or strength of your heartbeat.  You are taking a blood-thinning medicine and you get more bruising.  You get tired more easily when you move or exercise.  You have a sudden change in weight. Get help right away if:   You have pain in your chest or your belly (abdomen).  You have trouble breathing.  You have side effects of blood thinners, such as blood in your vomit, poop (stool), or pee (urine), or bleeding that cannot stop.  You have any signs of a stroke. "BE FAST" is an easy way to remember the main warning signs: ? B - Balance. Signs are dizziness, sudden trouble walking,  or loss of balance. ? E - Eyes. Signs are trouble seeing or a change in how you see. ? F - Face. Signs are sudden weakness or loss of feeling in the face, or the face or eyelid drooping on one side. ? A - Arms. Signs are weakness or loss of feeling in an arm. This happens suddenly and usually on one side of the body. ? S - Speech. Signs are sudden trouble speaking, slurred speech, or trouble understanding what people say. ? T - Time. Time to call emergency services. Write down what time symptoms started.  You have other signs of a stroke, such as: ? A sudden, very bad headache with no known cause. ? Feeling like you may vomit (nausea). ? Vomiting. ? A seizure. These symptoms may be an emergency. Do not wait to see if the symptoms will go away. Get medical help right away. Call your local emergency services (911 in the [place] Do not drive  yourself to the hospital. Summary  Atrial fibrillation is a type of heartbeat that is irregular or fast.  You are at higher risk of this condition if you smoke, are older, have diabetes, or are overweight.  Follow your doctor's instructions about medicines, diet, exercise, and follow-up visits.  Get help right away if you have signs or symptoms of a stroke.  Get help right away if you cannot catch your breath, or you have chest pain or discomfort. This information is not intended to replace advice given to you by your health care provider. Make sure you discuss any questions you have with your health care provider. Document Revised: 01/25/2019 Document Reviewed: 01/25/2019 Elsevier Patient Education  [place].

## 2019-12-26 NOTE — Progress Notes (Signed)
Office Visit    Patient Name: Jill Snow Date of Encounter: 12/26/2019  Primary Care Provider:  Leone Haven, MD Primary Cardiologist:  Ida Rogue, MD Electrophysiologist:  None   Chief Complaint    Jill Snow is a 86 y.o. female with a hx of  PAF (July 2011, May 2021), atrial flutter s/p cardioversion in 2014, HTN, HLD, OSA on CPAP presents today for follow-up of PAF after recent recurrence during hospitalization.  Past Medical History    Past Medical History:  Diagnosis Date  . Atrial fibrillation ([place])   . Atrial flutter ([place])    a. s/p successful TEE/DCCV in 2014; b. TEE 2014 showed EF 45-50%, mild bi-atrial enlargement, mod MR  . Dyspnea   . History of chicken pox   . Hypercholesterolemia   . Hypertension   . Mitral regurgitation    a. echo 2014: EF 40-45%, mildly dilated RV, mod reduced RV systolic fxn, mod dilated LA, Mild to mod MR, mod TR, mildly elevated PASP  . PAF (paroxysmal atrial fibrillation) ([place])    a. initial episode 2011; b. not on long term anticoagulation since 06/2013  . RBBB (right bundle branch block)   . Seasonal allergies   . Sleep apnea    a. on CPAP   Past Surgical History:  Procedure Laterality Date  . ABDOMINAL HYSTERECTOMY    . TONSILLECTOMY AND ADENOIDECTOMY  1947  . tummy tuck      Allergies  Allergies  Allergen Reactions  . Codeine Anaphylaxis    "cant remember reaction"  . Augmentin [Amoxicillin-Pot Clavulanate] Nausea And Vomiting  . Crestor [Rosuvastatin] Other (See Comments)    Muscle pain/cramps     History of Present Illness    Jill Snow is a 86 y.o. female with a hx of PAF (July 2011, May 2021), atrial flutter s/p cardioversion in 2014, HTN, HLD, OSA on CPAP. She was last seen 03/2018 by Dr. Rockey Situ.  Admitted 04/2013 for palpitations, tachycardia, shortness of breath. She had atrial flutter in setting of URI. Given IV Cardizem with minimal improvement in heart rate. She was  hypotensive. Ultimately underwent TEE cardioversion and rate difficult to control. TTE 05/16/2013 with LVEF 45-50%, mildly dilated LA/RA, moderate MR. She was started on anticoagulation.   In follow up 03/2018 was noted to be noncompliant with medications. Not taking anticoagulation nor amiodarone. She was recommended to monitor for recurrent arrhythmia.   She had a CT chest abdomen in 2018 showing mild aortic atherosclerosis and mild coronary artery calcification to the LAD and RCA by Dr. Donivan Scull review.   She was admitted in 12/22/19-12/24/19 in [place] while on vacation for cellulitis and infection. She presented with fever and left arm pain. A week prior she had mechanical fall, landed on left side, developed erythema and tenderness. Found to have LUE celluitis and LLL PNA with L pleural effusion. Treated with IV abx. Per documentation she had atrial fibrillation while in the ED but self converted to NSR. Unfortunately that EKG is unavailable for my review. Additional EKG reported as ST with occasional PVC and RBBB.   Labs 12/24/19 via [facility] Everywhere:   TSH 3.57, free T4 1.08  Hb 12.4, WBC 8.8  Creatinine 0.63, GFR 90, Na 138, K 3.4,   Labs 12/22/19 via [facility] Everywhere:  AST 30, ALT 18  She presents to her daughter today due to her cognitive status.  Tells me her mentation has improved since the acute infection has improved but they still  plan to follow-up with neurology due to some changes in short-term memory over the last 6 months.  Her daughter tells me there have been 3 different stories regarding how she injured her arm.  At one point she said the cat scratched her, another time she use said she slipped out of the bed and hit her arm, a third time she said she fell on the porch.  It is very unclear which scenario happened or if multiple occurred.  She does live with her son who works as a Curator and is in and out of the house and has her other children called to check on her as  well.  Reports no shortness of breath.  Did have some dyspnea on exertion with walking into the office but attributes this to still recuperating from her pneumonia and hospitalization.  Reports no chest pain, pressure, or tightness. No edema, orthopnea, PND. Reports no palpitations, lightheadedness, dizziness, near-syncope, syncope.  She remains on oral doxycycline for her cellulitis and LLL pneumonia.  Her daughter shares with me that her left arm is much improved.  Jill Snow does endorse that it is still somewhat sore but better than it was.  Long discussion regarding atrial fibrillation and the risk of stroke.  Discussed utility of chronic anticoagulation in the setting of stroke risk.  Concern regarding her mentation and falls in the setting of anticoagulation.  Her daughter is very hesitant regarding starting her on anticoagulation.  She does not meet criteria for reduced dose anticoagulation.  EKGs/Labs/Other Studies Reviewed:   The following studies were reviewed today:  Carotid duplex 03/2017 Minimal plaque, bilaterally. Stable, 1-39% bilateral ICA stenosis. Patent vertebral arteries with antegrade flow. Normal subclavian arteries, bilaterally. Abnormal thyroid appearance-there is a well-circumscribed heterogenous, vascular structure in the left lobe of the thyroid measuring 2.3 cm x 2 cm.  Echo 03/2017 Left ventricle: The cavity size was normal. Systolic function was    normal. The estimated ejection fraction was in the range of 60%    to 65%. Wall motion was normal; there were no regional wall    motion abnormalities. Left ventricular diastolic function    parameters were normal.  - Left atrium: The atrium was mildly dilated.  - Right ventricle: Systolic function was normal.  - Pulmonary arteries: Systolic pressure was mildly elevated. PA    peak pressure: 35 mm Hg (S).   EKG:  EKG is ordered today.  The ekg ordered today demonstrates NSR 75 bpm with RBBB  Recent Labs: No  results found for requested labs within last 8760 hours.  Recent Lipid Panel    Component Value Date/Time   CHOL 266 (H) 05/16/2018 1045   CHOL 281 (H) 03/07/2014 0832   TRIG 271.0 (H) 05/16/2018 1045   HDL 34.40 (L) 05/16/2018 1045   HDL 49 03/07/2014 0832   CHOLHDL 8 05/16/2018 1045   VLDL 54.2 (H) 05/16/2018 1045   LDLCALC 191 (H) 07/08/2015 0913   LDLCALC 202 (H) 03/07/2014 0832   LDLDIRECT 224.0 06/17/2018 1020    Home Medications   Current Meds  Medication Sig  . atorvastatin (LIPITOR) 40 MG tablet Take 1 tablet (40 mg total) by mouth daily.  . CVS D3 50 MCG (2000 UT) CAPS TAKE 1 CAPSULE EVERY DAY  . doxycycline (MONODOX) 100 MG capsule Take 100 mg by mouth 2 (two) times daily. Taking 1 capsule morning and at night for 7 days.  . fluticasone (FLONASE) 50 MCG/ACT nasal spray Place 2 sprays into both nostrils daily.  Marland Kitchen  IBU 400 MG tablet TAKE ONE TABLET BY MOUTH EVERY 8 HOURS AS NEEDED FOR MILD PAIN  . montelukast (SINGULAIR) 10 MG tablet Take 1 tablet (10 mg total) by mouth daily.  . simvastatin (ZOCOR) 40 MG tablet Take 40 mg by mouth daily.  . vitamin E 400 UNIT capsule Take 400 Units by mouth daily.      Review of Systems      Review of Systems  Constitution: Negative for chills, fever and malaise/fatigue.  Cardiovascular: Positive for dyspnea on exertion. Negative for chest pain, irregular heartbeat, leg swelling, near-syncope, orthopnea, palpitations and syncope.  Respiratory: Negative for cough, shortness of breath and wheezing.   Skin: Positive for color change (LUL with cellulitis).  Musculoskeletal: Positive for falls (approx 2 in last 6 months).       (+) left arm sore  Gastrointestinal: Negative for melena, nausea and vomiting.  Genitourinary: Negative for hematuria.  Neurological: Negative for dizziness, light-headedness and weakness.   All other systems reviewed and are otherwise negative except as noted above.  Physical Exam    VS:  BP 140/76 (BP  Location: Right Arm, Patient Position: Sitting, Cuff Size: Normal)   Pulse 75   Ht 5' 6.5" (1.689 m)   Wt 177 lb 2 oz (80.3 kg)   SpO2 98%   BMI 28.16 kg/m  , BMI Body mass index is 28.16 kg/m. GEN: Well nourished, well developed, in no acute distress. HEENT: normal. Neck: Supple, no JVD, carotid bruits, or masses. Cardiac: RRR, no murmurs, rubs, or gallops. No clubbing, cyanosis, edema.  Radials/PT 2+ and equal bilaterally.  Respiratory:  Respirations regular and unlabored, clear to auscultation bilaterally. GI: Soft, nontender, nondistended. MS: No deformity or atrophy. Skin: Warm and dry, no rash. Left forearm with erythema. 0.5" in circumference dried scab to L anterior forearm.  Neuro:  Strength and sensation are intact. Psych: Normal affect. Alert to person, place, time - noted cognitive, memory impairment.  Accessory Clinical Findings    ECG personally reviewed by me today -normal sinus rhythm 75 bpm with stable RBBB- no acute changes.  Assessment & Plan    1. PAF - Diagnosis 2011 with atrial flutter requiring cardioversion 2014. Repeat occurrence at outside hospital May 2021 in setting of cellulitis and PNA after reported fall and injury to arm. She self-converted. Labs while admitted with normal thyroid, no anemia, overall normal electrolytes with outlier of K 3.4 on one day of admission. EKG from outside hospital unfortunately unavailable for review.   She was previously on Eliquis 5mg  BID and Amiodarone which she tolerated well, but discontinued >1 year ago due to noncompliance.   EKG today shows NSR with known RBBB. Reports no recurrent palpitations. Is not sure if she was aware of the episode as her mentation was poor in setting of dementia/infection.   Long discussion regarding risk/benefit of anticoagulation. CHADS2VASc of 4 (agex2, gender, hx HTN). Notes approximately 2 falls in last 6 months, but likely incomplete history as Jill Snow does not have 24 hour  supervision. Daughter is understandably very concerned regarding falls while on [place] due to her mentation as there were 3 different stories of how she injured her arm leading to cellulitis. Would prefer Eliquis 5mg  BID due to its lower bleeding incidence than Xarelto. She does not meet dose reduction criteria as she is not <60kg and has normal renal function. After discussion of risks and benefits she and the daughter prefer to reassess [facility] after her monitor results - she and daughter  verbalized understanding of risk of stroke. ?Candidate for Watchman - will discuss with her primary cardiologist.   14 day ZIO-XT placed today to assess for recurrent atrial fib/flutter. If recurrent fib/flutter noted consider re-addition of Amiodarone.   Continue Metoprolol.   2. HTN - BP mildly elevated today. Was well controlled during recent admission. Recommend keeping log and bringing to next visit. Has previously been on anti-hypertensive therapy but presently only on Metoprolol.   3. HLD - Continue Atorvastatin 40mg  daily.   4. Dementia - Daughter is present today to assist with history taking. Notes decline in cognitive function over last 6 months. Has upcoming appt with primary care and plans to see neurology.   5. Lung nodule - Most recent CT chest 11/2016 with recommendation for repeat study q2years until 5 year stability. CT chest ordered by PCP but not yet completed. They verbalized understanding of this testing recommendation and I encouraged them to schedule when they see primary care Friday.   6. RBBB -stable finding on EKG.  Denies lightheadedness, dizziness, near syncope.  Continue to monitor with routine EKG.  7. Coronary artery calcification on CT - No anginal symptoms, no indication for ischemic evaluation. Continue beta blocker and statin. Daughter asks whether she needs to be on aspirin - if we decide against [place] would recommend Aspirin 81mg  daily for secondary prevention of CAD.   Disposition:  14 day ZIO placed. Follow up in 5 week(s) with Dr. Rockey Situ or APP.   Loel Dubonnet, NP 12/26/2019, 2:51 PM

## 2019-12-29 ENCOUNTER — Other Ambulatory Visit: Payer: Self-pay

## 2019-12-29 ENCOUNTER — Encounter: Payer: Self-pay | Admitting: Family Medicine

## 2019-12-29 ENCOUNTER — Ambulatory Visit: Payer: Medicare PPO | Admitting: Family Medicine

## 2019-12-29 VITALS — BP 138/78 | HR 72 | Temp 97.3°F | Ht 66.5 in | Wt 178.2 lb

## 2019-12-29 DIAGNOSIS — I480 Paroxysmal atrial fibrillation: Secondary | ICD-10-CM | POA: Diagnosis not present

## 2019-12-29 DIAGNOSIS — J189 Pneumonia, unspecified organism: Secondary | ICD-10-CM

## 2019-12-29 DIAGNOSIS — L03114 Cellulitis of left upper limb: Secondary | ICD-10-CM

## 2019-12-29 DIAGNOSIS — W19XXXA Unspecified fall, initial encounter: Secondary | ICD-10-CM | POA: Insufficient documentation

## 2019-12-29 DIAGNOSIS — R8290 Unspecified abnormal findings in urine: Secondary | ICD-10-CM | POA: Diagnosis not present

## 2019-12-29 DIAGNOSIS — R413 Other amnesia: Secondary | ICD-10-CM

## 2019-12-29 MED ORDER — DOXYCYCLINE HYCLATE 100 MG PO TABS: 100.0000 mg | ORAL_TABLET | Freq: Two times a day (BID) | ORAL | 0 refills | Status: DC

## 2019-12-29 NOTE — Assessment & Plan Note (Signed)
Possible pneumonia as well.  Treating with doxycycline.  Lung sounds are clear today.  We will plan for repeat chest x-ray in a month or so.

## 2019-12-29 NOTE — Patient Instructions (Signed)
Nice to see you. We will continue your doxycycline for another week.  I sent this to your pharmacy. We will have you return next week for recheck. If you develop spreading redness, fever, or you start to feel ill please seek medical attention immediately.

## 2019-12-29 NOTE — Assessment & Plan Note (Signed)
Significantly improved based on the picture of the patient's daughter showed me.  Given that she does still have some erythema we will extend her course of doxycycline for another week.  They will follow up with me sometime next week for recheck.  Discussed reasons to go back to the emergency room.

## 2019-12-29 NOTE — Assessment & Plan Note (Signed)
Recheck urine in 2 to 3 weeks.

## 2019-12-29 NOTE — Progress Notes (Signed)
[name], MD Phone: [phone]  Jill Snow is a 86 y.o. female who presents today for follow-up.  Left forearm cellulitis/pneumonia: Patient was hospitalized in [place] for this.  She slipped and hit the nightstand and had a cut on her forearm and then over the next day or so developed significant erythema all the way up to her shoulder.  She was admitted and started on IV doxy and then transition to oral doxy as she improved.  They additionally noted a possible pneumonia on her chest x-ray with a small pleural effusion.  The patient has not had any cough or congestion though did have some dyspnea after being discharged from the hospital.  A. fib: The patient went into A. fib while in the hospital.  She converted to sinus rhythm.  She followed up with cardiology earlier this week and they have her on a ZIO monitor to help determine if she needs to go on to an anticoagulant.  Falls: She reports 1 fall out of bed.  Small abrasion on her arm.  Her daughter and her both note that she has no issues with walking or feeling unsteady.  They do wonder about physical therapy in the future.  Abnormal urinalysis: It appears that she may have had a UTI as well given her urinalysis results.  Her daughter reports that this makes sense given that the patient was acting abnormally while sick.  Social History   Tobacco Use  Smoking Status Never Smoker  Smokeless Tobacco Never Used     ROS see history of present illness  Objective  Physical Exam Vitals:   12/29/19 1133  BP: 138/78  Pulse: 72  Temp: (!) 97.3 F (36.3 C)  SpO2: 97%    BP Readings from Last 3 Encounters:  12/29/19 138/78  12/26/19 140/76  06/29/18 128/64   Wt Readings from Last 3 Encounters:  12/29/19 178 lb 3.2 oz (80.8 kg)  12/26/19 177 lb 2 oz (80.3 kg)  05/22/19 159 lb (72.1 kg)    Physical Exam Constitutional:      General: She is not in acute distress.    Appearance: She is not  diaphoretic.  Cardiovascular:     Rate and Rhythm: Normal rate and regular rhythm.     Heart sounds: Normal heart sounds.  Pulmonary:     Effort: Pulmonary effort is normal.     Breath sounds: Normal breath sounds.  Skin:    General: Skin is warm and dry.     Comments: Erythema on the dorsal left forearm with some erythema wrapping around, there is no significant warmth, there is no induration, there is no tenderness, there is a scab centrally that appears to be well-healing  Neurological:     Mental Status: She is alert.      Assessment/Plan: Please see individual problem list.  Cellulitis Significantly improved based on the picture of the patient's daughter showed me.  Given that she does still have some erythema we will extend her course of doxycycline for another week.  They will follow up with me sometime next week for recheck.  Discussed reasons to go back to the emergency room.  Pneumonia Possible pneumonia as well.  Treating with doxycycline.  Lung sounds are clear today.  We will plan for repeat chest x-ray in a month or so.  Atrial fibrillation ([facility]) Sinus rhythm today.  She will continue to follow with cardiology for this.  Fall Discussed the potential for physical therapy in the future when she  is over her acute illness.  Abnormal urinalysis Recheck urine in 2 to 3 weeks.  Memory difficulty Worsened during recent infection.  Seems to have improved.  We will continue to monitor and consider formal evaluation if needed.    Orders Placed This Encounter  Procedures  . POCT urinalysis dipstick    Standing Status:   Future    Standing Expiration Date:   02/28/2020    Meds ordered this encounter  Medications  . doxycycline (VIBRA-TABS) 100 MG tablet    Sig: Take 1 tablet (100 mg total) by mouth 2 (two) times daily.    Dispense:  14 tablet    Refill:  0    This visit occurred during the SARS-CoV-2 public health emergency.  Safety protocols were in place,  including screening questions prior to the visit, additional usage of staff PPE, and extensive cleaning of exam room while observing appropriate contact time as indicated for disinfecting solutions.    [name], MD [place]

## 2019-12-29 NOTE — Assessment & Plan Note (Signed)
Worsened during recent infection.  Seems to have improved.  We will continue to monitor and consider formal evaluation if needed.

## 2019-12-29 NOTE — Assessment & Plan Note (Signed)
Discussed the potential for physical therapy in the future when she is over her acute illness.

## 2019-12-29 NOTE — Assessment & Plan Note (Signed)
Sinus rhythm today.  She will continue to follow with cardiology for this.

## 2020-01-01 ENCOUNTER — Other Ambulatory Visit: Payer: Medicare HMO

## 2020-01-03 ENCOUNTER — Telehealth: Payer: Self-pay | Admitting: Cardiovascular Disease

## 2020-01-03 NOTE — Telephone Encounter (Signed)
Spoke with patients daughter per release form. She states that she removed the monitor sometime over the weekend. She went to look for the monitor and it was blinking red in one of her bedroom baskets. Instructed her to mail monitor back and once report comes in we can determine if she will need another one or not. She verbalized understanding with no further questions at this time.

## 2020-01-03 NOTE — Telephone Encounter (Signed)
  1. Is this related to a heart monitor you are wearing? Yes   2. What is your issue?? Patient daughter discovered patient monitor was removed sometime either weekend or Monday and now she wants to know if she should mail in or put back on   Please route to covering RN/CMA/RMA for results. Route to monitor technicians or your monitor tech representative for your site for any technical concerns

## 2020-01-03 NOTE — Telephone Encounter (Signed)
Daughter will be working . If no answer please leave detailed msg to put back on or return

## 2020-01-05 ENCOUNTER — Encounter: Payer: Self-pay | Admitting: Family Medicine

## 2020-01-05 ENCOUNTER — Other Ambulatory Visit: Payer: Self-pay

## 2020-01-05 ENCOUNTER — Ambulatory Visit: Payer: Medicare HMO | Admitting: Family Medicine

## 2020-01-05 DIAGNOSIS — N1339 Other hydronephrosis: Secondary | ICD-10-CM

## 2020-01-05 DIAGNOSIS — L03114 Cellulitis of left upper limb: Secondary | ICD-10-CM

## 2020-01-05 DIAGNOSIS — J189 Pneumonia, unspecified organism: Secondary | ICD-10-CM | POA: Diagnosis not present

## 2020-01-05 DIAGNOSIS — R911 Solitary pulmonary nodule: Secondary | ICD-10-CM | POA: Diagnosis not present

## 2020-01-05 DIAGNOSIS — R8290 Unspecified abnormal findings in urine: Secondary | ICD-10-CM

## 2020-01-05 NOTE — Progress Notes (Signed)
  [name], MD Phone: [phone]  Jill Snow is a 86 y.o. female who presents today for f/u.  Cellulitis: Progressively improving.  Almost completely resolved.  She still has about 3 days of antibiotics.  No fevers.  She feels well overall.  Pneumonia/pleural effusion: Notes no cough or wheezing.  No fevers.  UTI: Has an appointment for recheck of urinalysis still needs to reschedule this as she will be out of town.  Social History   Tobacco Use  Smoking Status Never Smoker  Smokeless Tobacco Never Used     ROS see history of present illness  Objective  Physical Exam Vitals:   01/05/20 1010  BP: 110/60  Pulse: 74  Temp: (!) 97.3 F (36.3 C)  SpO2: 99%    BP Readings from Last 3 Encounters:  01/05/20 110/60  12/29/19 138/78  12/26/19 140/76   Wt Readings from Last 3 Encounters:  01/05/20 176 lb 6.4 oz (80 kg)  12/29/19 178 lb 3.2 oz (80.8 kg)  12/26/19 177 lb 2 oz (80.3 kg)    Physical Exam Constitutional:      General: She is not in acute distress.    Appearance: She is not diaphoretic.  Cardiovascular:     Rate and Rhythm: Normal rate and regular rhythm.     Heart sounds: Normal heart sounds.  Pulmonary:     Effort: Pulmonary effort is normal.     Breath sounds: Normal breath sounds.  Skin:    General: Skin is warm and dry.     Comments: Redness on left forearm has improved significantly, no warmth, no tenderness  Neurological:     Mental Status: She is alert.      Assessment/Plan: Please see individual problem list.  Pneumonia Follow-up chest x-ray ordered.  She will have an appointment scheduled for this.  Cellulitis Significantly improved.  She will complete her course of antibiotics.  She will let us know if she has any worsening symptoms.  Abnormal urinalysis They will reschedule her urinalysis appointment.  Lung nodule Patient never completed follow-up on this.  Discussed getting CT scan scheduled with the patient and  her daughter.  Referral coordinator will get this scheduled.  Right hydronephrosis Discussed the need for follow-up imaging on this.  Discussed getting this scheduled with our referral coordinator.  Orders are already in place.   Orders Placed This Encounter  Procedures  . DG Chest 2 View    Standing Status:   Future    Standing Expiration Date:   03/06/2020    Order Specific Question:   Reason for Exam (SYMPTOM  OR DIAGNOSIS REQUIRED)    Answer:   follow-up from pneumonia    Order Specific Question:   Preferred imaging location?    Answer:   [facility] Specific Question:   Radiology Contrast Protocol - do NOT remove file path    Answer:   [email or web address]    No orders of the defined types were placed in this encounter.   This visit occurred during the SARS-CoV-2 public health emergency.  Safety protocols were in place, including screening questions prior to the visit, additional usage of staff PPE, and extensive cleaning of exam room while observing appropriate contact time as indicated for disinfecting solutions.    [name], MD [place]

## 2020-01-05 NOTE — Assessment & Plan Note (Signed)
Significantly improved.  She will complete her course of antibiotics.  She will let us know if she has any worsening symptoms.

## 2020-01-05 NOTE — Assessment & Plan Note (Addendum)
Follow-up chest x-ray ordered.  She will have an appointment scheduled for this.

## 2020-01-05 NOTE — Assessment & Plan Note (Signed)
Patient never completed follow-up on this.  Discussed getting CT scan scheduled with the patient and her daughter.  Referral coordinator will get this scheduled.

## 2020-01-05 NOTE — Assessment & Plan Note (Signed)
Discussed the need for follow-up imaging on this.  Discussed getting this scheduled with our referral coordinator.  Orders are already in place.

## 2020-01-05 NOTE — Assessment & Plan Note (Signed)
They will reschedule her urinalysis appointment.

## 2020-01-19 ENCOUNTER — Other Ambulatory Visit: Payer: Medicare HMO

## 2020-01-22 DIAGNOSIS — I480 Paroxysmal atrial fibrillation: Secondary | ICD-10-CM | POA: Diagnosis not present

## 2020-01-25 ENCOUNTER — Other Ambulatory Visit: Payer: Self-pay

## 2020-01-26 MED ORDER — ATORVASTATIN CALCIUM 40 MG PO TABS: 40.0000 mg | ORAL_TABLET | Freq: Every day | ORAL | 1 refills | Status: DC

## 2020-01-26 NOTE — Telephone Encounter (Signed)
I will refill her cholesterol medication though the doxycycline is an antibiotic and if she feels she needs this again she will have to be seen by [name] or at urgent care to determine if it is appropriate.

## 2020-01-29 ENCOUNTER — Other Ambulatory Visit: Payer: Self-pay

## 2020-01-29 NOTE — Telephone Encounter (Signed)
This was for an acute infection on her arm. If she feels that she needs more of this then she needs to be seen in person.

## 2020-01-30 ENCOUNTER — Ambulatory Visit: Payer: Medicare HMO | Admitting: Family

## 2020-01-30 ENCOUNTER — Ambulatory Visit (INDEPENDENT_AMBULATORY_CARE_PROVIDER_SITE_OTHER): Payer: Medicare HMO

## 2020-01-30 ENCOUNTER — Other Ambulatory Visit: Payer: Self-pay

## 2020-01-30 ENCOUNTER — Encounter: Payer: Self-pay | Admitting: Family

## 2020-01-30 ENCOUNTER — Other Ambulatory Visit (INDEPENDENT_AMBULATORY_CARE_PROVIDER_SITE_OTHER): Payer: Medicare HMO

## 2020-01-30 ENCOUNTER — Other Ambulatory Visit: Payer: Self-pay | Admitting: Family Medicine

## 2020-01-30 VITALS — BP 130/70 | HR 70 | Ht 66.5 in | Wt 175.0 lb

## 2020-01-30 DIAGNOSIS — K449 Diaphragmatic hernia without obstruction or gangrene: Secondary | ICD-10-CM | POA: Diagnosis not present

## 2020-01-30 DIAGNOSIS — I4892 Unspecified atrial flutter: Secondary | ICD-10-CM | POA: Diagnosis not present

## 2020-01-30 DIAGNOSIS — J189 Pneumonia, unspecified organism: Secondary | ICD-10-CM | POA: Diagnosis not present

## 2020-01-30 DIAGNOSIS — I700 Atherosclerosis of aorta: Secondary | ICD-10-CM

## 2020-01-30 DIAGNOSIS — R8290 Unspecified abnormal findings in urine: Secondary | ICD-10-CM

## 2020-01-30 DIAGNOSIS — I480 Paroxysmal atrial fibrillation: Secondary | ICD-10-CM

## 2020-01-30 LAB — POCT URINALYSIS DIPSTICK
Glucose, UA: NEGATIVE
Nitrite, UA: NEGATIVE
Protein, UA: NEGATIVE
Spec Grav, UA: 1.02 (ref 1.010–1.025)
Urobilinogen, UA: 0.2 E.U./dL
pH, UA: 5.5 (ref 5.0–8.0)

## 2020-01-30 LAB — URINALYSIS, MICROSCOPIC ONLY

## 2020-01-30 MED ORDER — RIVAROXABAN 20 MG PO TABS
20.0000 mg | ORAL_TABLET | Freq: Every day | ORAL | 5 refills | Status: DC
Start: 2020-01-30 — End: 2020-03-04

## 2020-01-30 NOTE — Progress Notes (Signed)
Office Visit    Patient Name: Jill Snow Date of Encounter: 01/30/2020  Primary Care Provider:  Leone Haven, MD Primary Cardiologist:  Ida Rogue, MD Electrophysiologist:  None   Chief Complaint    Jill Snow is a 86 y.o. female with a hx of  PAF (July 2011, May 2021), atrial flutter s/p cardioversion in 2014, HTN, HLD, OSA on CPAP presents today for follow-up after ZIO monitor.  Past Medical History    Past Medical History:  Diagnosis Date  . Atrial fibrillation ([place])   . Atrial flutter ([place])    a. s/p successful TEE/DCCV in 2014; b. TEE 2014 showed EF 45-50%, mild bi-atrial enlargement, mod MR  . Dyspnea   . History of chicken pox   . Hypercholesterolemia   . Hypertension   . Mitral regurgitation    a. echo 2014: EF 40-45%, mildly dilated RV, mod reduced RV systolic fxn, mod dilated LA, Mild to mod MR, mod TR, mildly elevated PASP  . PAF (paroxysmal atrial fibrillation) ([place])    a. initial episode 2011; b. not on long term anticoagulation since 06/2013  . RBBB (right bundle branch block)   . Seasonal allergies   . Sleep apnea    a. on CPAP   Past Surgical History:  Procedure Laterality Date  . ABDOMINAL HYSTERECTOMY    . TONSILLECTOMY AND ADENOIDECTOMY  1947  . tummy tuck      Allergies  Allergies  Allergen Reactions  . Codeine Anaphylaxis    "cant remember reaction"  . Augmentin [Amoxicillin-Pot Clavulanate] Nausea And Vomiting  . Crestor [Rosuvastatin] Other (See Comments)    Muscle pain/cramps     History of Present Illness    Jill Snow is a 86 y.o. female with a hx of PAF (July 2011, May 2021), atrial flutter s/p cardioversion in 2014, HTN, HLD, OSA on CPAP. She was last seen 12/26/19.  Admitted 04/2013 for palpitations, tachycardia, shortness of breath. She had atrial flutter in setting of URI. Given IV Cardizem with minimal improvement in heart rate. She was hypotensive. Ultimately underwent TEE cardioversion  and rate difficult to control. TTE 05/16/2013 with LVEF 45-50%, mildly dilated LA/RA, moderate MR. She was started on anticoagulation.   In follow up 03/2018 was noted to be noncompliant with medications. Not taking anticoagulation nor amiodarone. She was recommended to monitor for recurrent arrhythmia.   She had a CT chest abdomen in 2018 showing mild aortic atherosclerosis and mild coronary artery calcification to the LAD and RCA by Dr. Donivan Scull review.   She was admitted in 12/22/19-12/24/19 in [place] while on vacation for cellulitis and infection. She presented with fever and left arm pain. A week prior she had mechanical fall, landed on left side, developed erythema and tenderness. Found to have LUE celluitis and LLL PNA with L pleural effusion. Treated with IV abx. Per documentation she had atrial fibrillation while in the ED but self converted to NSR. Unfortunately that EKG is unavailable for my review. Additional EKG reported as ST with occasional PVC and RBBB.   Labs 12/24/19 via Care Everywhere:   TSH 3.57, free T4 1.08  Hb 12.4, WBC 8.8  Creatinine 0.63, GFR 90, Na 138, K 3.4,   Labs 12/22/19 via Care Everywhere:  AST 30, ALT 18  Clinic visit 12/26/19 noted multiple stories to how she injured her arm. Was planning to follow-up with neurology regarding cognitive changes.  Cognitive status had improved since previous hospitalization.  She and daughter are  understandably concerned regarding mentation and falls and risk of anticoagulation.  She was recommended for 14-day ZIO to assess for recurrent atrial fibrillation.  Subsequent ZIO showed predominantly NSR average heart rate 74 bpm, infrequent PVC/PAC, 3261 episodes of SVT with longest 8min17sec at rate of 103bpm and fastest 5 beats with max rate 203bpm.   Present today with her daughter.  Has plans for upcoming urinalysis and CXR for follow-up to recent hospitalization ordered by her primary care provider.  Reports no falls since  last office visit.  Reports improvement in her cognitive status but still with some short-term memory changes.  Plans to establish with neurology after having some of her overdue testing completed.  Monitor results reviewed.  Discussed risk and benefits of anticoagulation in the setting of CHA2DS2-VASc of 4 and PAF. She and her daughter were agreeable to start anticoagulation.  They preferred Xarelto for the once daily regimen as this will be easier for her to maintain.    EKGs/Labs/Other Studies Reviewed:   The following studies were reviewed today:  ZIO 12/2019 Normal sinus rhythm avg HR of 74 bpm.  Bundle Branch Block/IVCD was present.    3261 Supraventricular Tachycardia runs occurred, the run with the fastest interval lasting 5 beats with a max rate of 203 bpm, the longest lasting 1 min 17 secs with an avg rate of 103 bpm.   Isolated SVEs were occasional (1.2%, 8306), SVE Couplets were rare (<1.0%, 1714), and SVE Triplets were rare (<1.0%, 397).    Isolated VEs were rare (<1.0%, 3583), VE Couplets were rare (<1.0%, 40), and VE Triplets were rare (<1.0%, 4). Ventricular Bigeminy and Trigeminy were present.   No significant patient triggered events.  Carotid duplex 03/2017 Minimal plaque, bilaterally. Stable, 1-39% bilateral ICA stenosis. Patent vertebral arteries with antegrade flow. Normal subclavian arteries, bilaterally. Abnormal thyroid appearance-there is a well-circumscribed heterogenous, vascular structure in the left lobe of the thyroid measuring 2.3 cm x 2 cm.  Echo 03/2017 Left ventricle: The cavity size was normal. Systolic function was    normal. The estimated ejection fraction was in the range of 60%    to 65%. Wall motion was normal; there were no regional wall    motion abnormalities. Left ventricular diastolic function    parameters were normal.  - Left atrium: The atrium was mildly dilated.  - Right ventricle: Systolic function was normal.  - Pulmonary  arteries: Systolic pressure was mildly elevated. PA    peak pressure: 35 mm Hg (S).   EKG:  EKG is ordered today.  The ekg ordered today demonstrates NSR 75 bpm with RBBB and stable TWI in lead III.   Recent Labs: No results found for requested labs within last 8760 hours.  Recent Lipid Panel    Component Value Date/Time   CHOL 266 (H) 05/16/2018 1045   CHOL 281 (H) 03/07/2014 0832   TRIG 271.0 (H) 05/16/2018 1045   HDL 34.40 (L) 05/16/2018 1045   HDL 49 03/07/2014 0832   CHOLHDL 8 05/16/2018 1045   VLDL 54.2 (H) 05/16/2018 1045   LDLCALC 191 (H) 07/08/2015 0913   LDLCALC 202 (H) 03/07/2014 0832   LDLDIRECT 224.0 06/17/2018 1020    Home Medications   No outpatient medications have been marked as taking for the 01/30/20 encounter (Appointment) with Loel Dubonnet, NP.   Review of Systems      Review of Systems  Constitutional: Negative for chills, fever and malaise/fatigue.  Cardiovascular: Negative for chest pain, dyspnea on exertion, irregular heartbeat,  leg swelling, near-syncope, orthopnea, palpitations and syncope.  Respiratory: Negative for cough, shortness of breath and wheezing.   Musculoskeletal: Negative for falls.  Gastrointestinal: Negative for melena, nausea and vomiting.  Genitourinary: Negative for hematuria.  Neurological: Negative for dizziness, light-headedness and weakness.   All other systems reviewed and are otherwise negative except as noted above.  Physical Exam    VS:  There were no vitals taken for this visit. , BMI There is no height or weight on file to calculate BMI. GEN: Well nourished, well developed, in no acute distress. HEENT: normal. Neck: Supple, no JVD, carotid bruits, or masses. Cardiac: RRR, no murmurs, rubs, or gallops. No clubbing, cyanosis, edema.  Radials/PT 2+ and equal bilaterally.  Respiratory:  Respirations regular and unlabored, clear to auscultation bilaterally. GI: Soft, nontender, nondistended. MS: No deformity or  atrophy. Skin: Warm and dry, no rash. Neuro:  Strength and sensation are intact. Psych: Normal affect. Alert to person, place, time - noted cognitive, memory impairment.  Assessment & Plan     PAF - Diagnosis 2011 with atrial flutter requiring cardioversion 2014. Repeat occurrence at outside hospital May 2021 in setting of cellulitis and PNA after reported fall and injury to arm. She self-converted. Labs while admitted with normal thyroid, no anemia, overall normal electrolytes with outlier of K 3.4 on one day of admission. Previously on Eliquis 5mg  BID and Amiodarone which she tolerated well, but discontinued >1 year ago due to noncompliance. EKG today shows NSR with known RBBB. Reports no recurrent palpitations. Is not sure if she was aware of the episode as her mentation was poor in setting of dementia/infection. Discussion regarding risk/benefit of anticoagulation. CHADS2VASc of 4 (agex2, gender, hx HTN). 14 day ZIO-XT recently showed NSR and runs of SVT which were asymptomatic. Not presently on beta blocker and as she is asymptomatic will defer. Start Xarelto 20mg  daily (creatinine clearance 89). Plan for repeat CBC in 1 month at follow up.   1. HTN - BP well controlled.  2. HLD - Continue Atorvastatin 40mg  daily.   3. Dementia - Daughter is present today to assist with history taking. Notes decline in cognitive function over last 6 months. Plans to see neurology.   4. Lung nodule - Most recent CT chest 11/2016 with recommendation for repeat study q2years until 5 year stability. CT chest ordered by PCP but not yet completed.  5. RBBB -stable finding on EKG.  Denies lightheadedness, dizziness, near syncope.  Continue to monitor with routine EKG.  6. Coronary artery calcification on CT - No anginal symptoms, no indication for ischemic evaluation. Continue statin. No aspirin due to anticoagulation.   Disposition: Follow up in 4 week(s) with Dr. Rockey Situ as previously scheduled with CBC at that  visit for monitoring.   Loel Dubonnet, NP 01/30/2020, 9:53 AM

## 2020-01-30 NOTE — Patient Instructions (Addendum)
Medication Instructions: Start Xarelto 20 mg take one tablet daily in the evening.   *If you need a refill on your cardiac medications before your next appointment, please call your pharmacy*   Lab Work: No labs ordered today.   If you have labs (blood work) drawn today and your tests are completely normal, you will receive your results only by: Marland Kitchen MyChart Message (if you have MyChart) OR . A paper copy in the mail If you have any lab test that is abnormal or we need to change your treatment, we will call you to review the results.   Testing/Procedures: NONE  Follow-Up: At [facility], you and your health needs are our priority.  As part of our continuing mission to provide you with exceptional heart care, we have created designated Provider Care Teams.  These Care Teams include your primary Cardiologist (physician) and Advanced Practice Providers (APPs -  Physician Assistants and Nurse Practitioners) who all work together to provide you with the care you need, when you need it.  We recommend signing up for the patient portal called "MyChart".  Sign up information is provided on this After Visit Summary.  MyChart is used to connect with patients for Virtual Visits (Telemedicine).  Patients are able to view lab/test results, encounter notes, upcoming appointments, etc.  Non-urgent messages can be sent to your provider as well.   To learn more about what you can do with MyChart, go to [email or web address].    Your next appointment:   1 month(s)  The format for your next appointment:   In Person  Provider:   Ida Rogue, MD

## 2020-01-30 NOTE — Addendum Note (Signed)
Addended by: Leeanne Rio on: 01/30/2020 01:41 PM   Modules accepted: Orders

## 2020-02-05 ENCOUNTER — Telehealth: Payer: Self-pay | Admitting: Cardiovascular Disease

## 2020-02-05 NOTE — Telephone Encounter (Signed)
I spoke with the patient's daughter, Levada Dy (ok per DPR). She advised the patient took 2 doses of Xarelto and developed nausea/ vomiting.  She has been "vomiting all over herself today." Levada Dy also reports that the patient sounded like she was slurring her words earlier today.   Levada Dy is in a different location than the patient currently, but her brother is with the patient. They had already called 911 to come and evaluate her due to her symptoms.  I advised Levada Dy of Laurann Montana, NP's recommendations and that an evaluation in the ER does sound most appropriate at this time to insure there is no underlying cause for her symptoms.   Levada Dy is aware we will follow along on the outpatient side to see if the patient does require admission.   Levada Dy was appreciative for the call back.

## 2020-02-05 NOTE — Telephone Encounter (Signed)
Low suspicion Xarelto is causing her symptoms. She may hold for 2 doses if she wishes.   However, in setting of fatigue, throwing up, potential altered mental status would recommend ED evaluation for alternate cause. She was recently admitted in [place] for [place] in the setting of cellulitis.   Her daughter normally comes with her to appointments and I know she lives with her son so speaking to one of them may be helpful.  Loel Dubonnet, NP

## 2020-02-05 NOTE — Telephone Encounter (Signed)
Pt c/o medication issue:  1. Name of Medication: xarelto  2. How are you currently taking this medication (dosage and times per day)? 20 mg around 6 pm  3. Are you having a reaction (difficulty breathing--STAT)?   4. What is your medication issue? Tired, throwing up, "not herself" and out of it  Patient began medication this weekend and has so far only taken two doses

## 2020-02-12 ENCOUNTER — Telehealth: Payer: Self-pay

## 2020-02-12 NOTE — Telephone Encounter (Signed)
[facility] sent a medical release form signed by patient for the last 3 months office visit notes, this was faxed to [place]. Confirmation given.  Soffia Doshier,cma

## 2020-02-15 DIAGNOSIS — I480 Paroxysmal atrial fibrillation: Secondary | ICD-10-CM | POA: Diagnosis not present

## 2020-02-15 DIAGNOSIS — E785 Hyperlipidemia, unspecified: Secondary | ICD-10-CM | POA: Diagnosis not present

## 2020-02-15 DIAGNOSIS — I739 Peripheral vascular disease, unspecified: Secondary | ICD-10-CM | POA: Diagnosis not present

## 2020-02-23 ENCOUNTER — Telehealth: Payer: Self-pay | Admitting: Family Medicine

## 2020-02-23 NOTE — Telephone Encounter (Signed)
Please call the patient. She was supposed to have a CT of her chest to follow-up on a lung nodule though she never had this completed. This should be done as soon as she is able to complete this. Once you speak with her we can send this to Rasheedah to schedule. Thanks.

## 2020-02-23 NOTE — Telephone Encounter (Signed)
Left message for patient to return call back.  

## 2020-02-26 NOTE — Telephone Encounter (Signed)
LVM for the patient to call back. Magdaline Zollars,cma  

## 2020-02-26 NOTE — Telephone Encounter (Signed)
I called and spoke with the daughter and she does want the CT scan of the chest and this information was given to Rasheedah and she is calling to schedule.  Rachyl Wuebker,cma

## 2020-02-26 NOTE — Telephone Encounter (Signed)
Patient's daughter returned office phone call.
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
## 2020-03-04 ENCOUNTER — Encounter: Payer: Self-pay | Admitting: Cardiovascular Disease

## 2020-03-04 ENCOUNTER — Other Ambulatory Visit: Payer: Self-pay

## 2020-03-04 ENCOUNTER — Ambulatory Visit: Payer: Medicare HMO | Admitting: Cardiovascular Disease

## 2020-03-04 VITALS — BP 118/66 | HR 66 | Ht 67.0 in | Wt 175.4 lb

## 2020-03-04 DIAGNOSIS — E7800 Pure hypercholesterolemia, unspecified: Secondary | ICD-10-CM | POA: Diagnosis not present

## 2020-03-04 DIAGNOSIS — I480 Paroxysmal atrial fibrillation: Secondary | ICD-10-CM | POA: Diagnosis not present

## 2020-03-04 DIAGNOSIS — I2510 Atherosclerotic heart disease of native coronary artery without angina pectoris: Secondary | ICD-10-CM

## 2020-03-04 DIAGNOSIS — Z9989 Dependence on other enabling machines and devices: Secondary | ICD-10-CM | POA: Diagnosis not present

## 2020-03-04 DIAGNOSIS — I4892 Unspecified atrial flutter: Secondary | ICD-10-CM | POA: Diagnosis not present

## 2020-03-04 DIAGNOSIS — I700 Atherosclerosis of aorta: Secondary | ICD-10-CM

## 2020-03-04 DIAGNOSIS — I10 Essential (primary) hypertension: Secondary | ICD-10-CM

## 2020-03-04 DIAGNOSIS — F0390 Unspecified dementia without behavioral disturbance: Secondary | ICD-10-CM

## 2020-03-04 DIAGNOSIS — G4733 Obstructive sleep apnea (adult) (pediatric): Secondary | ICD-10-CM | POA: Diagnosis not present

## 2020-03-04 MED ORDER — ASPIRIN EC 81 MG PO TBEC
81.0000 mg | DELAYED_RELEASE_TABLET | Freq: Every day | ORAL | 3 refills | Status: DC
Start: 2020-03-04 — End: 2020-05-06

## 2020-03-04 MED ORDER — METOPROLOL SUCCINATE ER 25 MG PO TB24
12.5000 mg | ORAL_TABLET | Freq: Every day | ORAL | 3 refills | Status: DC
Start: 2020-03-04 — End: 2020-04-10

## 2020-03-04 NOTE — Patient Instructions (Addendum)
Medication Instructions:  Please start aspirin 81 mg daily Please start metoprolol succinate 12.5 mg daily  If you need a refill on your cardiac medications before your next appointment, please call your pharmacy.    Lab work: No new labs needed   If you have labs (blood work) drawn today and your tests are completely normal, you will receive your results only by: Marland Kitchen MyChart Message (if you have MyChart) OR . A paper copy in the mail If you have any lab test that is abnormal or we need to change your treatment, we will call you to review the results.   Testing/Procedures: No new testing needed   Follow-Up: At [facility], you and your health needs are our priority.  As part of our continuing mission to provide you with exceptional heart care, we have created designated Provider Care Teams.  These Care Teams include your primary Cardiologist (physician) and Advanced Practice Providers (APPs -  Physician Assistants and Nurse Practitioners) who all work together to provide you with the care you need, when you need it.  . You will need a follow up appointment in 12 months   . Providers on your designated Care Team:   . Murray Hodgkins, NP . Christell Faith, PA-C . Marrianne Mood, PA-C  Any Other Special Instructions Will Be Listed Below (If Applicable).  COVID-19 Vaccine Information can be found at: ShippingScam.co.uk For questions related to vaccine distribution or appointments, please email [email or web address] or call [phone].

## 2020-03-08 ENCOUNTER — Other Ambulatory Visit: Payer: Self-pay

## 2020-03-08 ENCOUNTER — Ambulatory Visit
Admission: RE | Admit: 2020-03-08 | Discharge: 2020-03-08 | Disposition: A | Discharge status: Home / Self Care | Payer: Medicare HMO | Source: Ambulatory Visit | Attending: Family Medicine | Admitting: Family Medicine

## 2020-03-08 DIAGNOSIS — R911 Solitary pulmonary nodule: Secondary | ICD-10-CM

## 2020-03-08 DIAGNOSIS — N1330 Unspecified hydronephrosis: Secondary | ICD-10-CM

## 2020-03-11 ENCOUNTER — Ambulatory Visit
Admission: RE | Admit: 2020-03-11 | Discharge: 2020-03-11 | Disposition: A | Discharge status: Home / Self Care | Payer: Medicare HMO | Source: Ambulatory Visit | Attending: Family Medicine | Admitting: Family Medicine

## 2020-03-11 ENCOUNTER — Other Ambulatory Visit: Payer: Self-pay

## 2020-03-11 DIAGNOSIS — N1330 Unspecified hydronephrosis: Secondary | ICD-10-CM | POA: Insufficient documentation

## 2020-03-11 DIAGNOSIS — J9811 Atelectasis: Secondary | ICD-10-CM | POA: Diagnosis not present

## 2020-03-11 DIAGNOSIS — R911 Solitary pulmonary nodule: Secondary | ICD-10-CM | POA: Diagnosis not present

## 2020-03-11 DIAGNOSIS — K4020 Bilateral inguinal hernia, without obstruction or gangrene, not specified as recurrent: Secondary | ICD-10-CM | POA: Diagnosis not present

## 2020-03-11 DIAGNOSIS — K429 Umbilical hernia without obstruction or gangrene: Secondary | ICD-10-CM | POA: Diagnosis not present

## 2020-03-11 LAB — POCT I-STAT CREATININE: Creatinine, Ser: 0.8 mg/dL (ref 0.44–1.00)

## 2020-03-11 MED ORDER — IOHEXOL 300 MG/ML  SOLN
100.0000 mL | Freq: Once | INTRAMUSCULAR | Status: AC | PRN
  Administered 2020-03-11: 100 mL via INTRAVENOUS

## 2020-04-05 ENCOUNTER — Other Ambulatory Visit: Payer: Self-pay | Admitting: Family Medicine

## 2020-04-05 DIAGNOSIS — J309 Allergic rhinitis, unspecified: Secondary | ICD-10-CM

## 2020-04-10 ENCOUNTER — Other Ambulatory Visit: Payer: Self-pay

## 2020-04-10 MED ORDER — METOPROLOL SUCCINATE ER 25 MG PO TB24
12.5000 mg | ORAL_TABLET | Freq: Every day | ORAL | 3 refills | Status: DC
Start: 2020-04-10 — End: 2020-04-25

## 2020-04-12 ENCOUNTER — Other Ambulatory Visit: Payer: Self-pay

## 2020-04-12 ENCOUNTER — Encounter: Payer: Self-pay | Admitting: Family Medicine

## 2020-04-12 ENCOUNTER — Telehealth (INDEPENDENT_AMBULATORY_CARE_PROVIDER_SITE_OTHER): Payer: Medicare HMO | Admitting: Family Medicine

## 2020-04-12 DIAGNOSIS — R911 Solitary pulmonary nodule: Secondary | ICD-10-CM

## 2020-04-12 DIAGNOSIS — N1339 Other hydronephrosis: Secondary | ICD-10-CM

## 2020-04-12 DIAGNOSIS — Z9989 Dependence on other enabling machines and devices: Secondary | ICD-10-CM

## 2020-04-12 DIAGNOSIS — G4733 Obstructive sleep apnea (adult) (pediatric): Secondary | ICD-10-CM

## 2020-04-12 DIAGNOSIS — I480 Paroxysmal atrial fibrillation: Secondary | ICD-10-CM | POA: Diagnosis not present

## 2020-04-12 DIAGNOSIS — R413 Other amnesia: Secondary | ICD-10-CM

## 2020-04-12 DIAGNOSIS — M25652 Stiffness of left hip, not elsewhere classified: Secondary | ICD-10-CM | POA: Diagnosis not present

## 2020-04-12 NOTE — Progress Notes (Signed)
Virtual Visit via telephone Note  This visit type was conducted due to national recommendations for restrictions regarding the COVID-19 pandemic (e.g. social distancing).  This format is felt to be most appropriate for this patient at this time.  All issues noted in this document were discussed and addressed.  No physical exam was performed (except for noted visual exam findings with Video Visits).   I connected with Jill Snow today at  2:15 PM EDT by telephone and verified that I am speaking with the correct person using two identifiers. Location patient: home Location provider: work Persons participating in the virtual visit: patient, provider, Zeb Comfort (daughter)  I discussed the limitations, risks, security and privacy concerns of performing an evaluation and management service by telephone and the availability of in person appointments. I also discussed with the patient that there may be a patient responsible charge related to this service. The patient expressed understanding and agreed to proceed.  Interactive audio and video telecommunications were attempted between this provider and patient, however failed, due to patient having technical difficulties OR patient did not have access to video capability.  We continued and completed visit with audio only.   Reason for visit: Follow-up  HPI: Memory difficulty: Notes this is relatively stable.  Is mostly short-term memory difficulty.  They are interested in seeing neurology for evaluation and treatment.  Notes she is able to function at home and cook for herself if needed.  A. fib: Patient recently saw cardiology.  She was tried on Xarelto and had side effects.  They have declined further anticoagulation.  She is on half a dose of her beta-blocker.  Overall doing well.  Left hip stiffness: This is been an ongoing issue.  The report is been x-rayed previously with no cause found.  They feel as though this contributed to a fall  in the past.  They wonder about physical therapy.   ROS: See pertinent positives and negatives per HPI.  Past Medical History:  Diagnosis Date  . Atrial fibrillation ([place])   . Atrial flutter ([place])    a. s/p successful TEE/DCCV in 2014; b. TEE 2014 showed EF 45-50%, mild bi-atrial enlargement, mod MR  . Dyspnea   . History of chicken pox   . Hypercholesterolemia   . Hypertension   . Mitral regurgitation    a. echo 2014: EF 40-45%, mildly dilated RV, mod reduced RV systolic fxn, mod dilated LA, Mild to mod MR, mod TR, mildly elevated PASP  . PAF (paroxysmal atrial fibrillation) ([place])    a. initial episode 2011; b. not on long term anticoagulation since 06/2013  . RBBB (right bundle branch block)   . Seasonal allergies   . Sleep apnea    a. on CPAP    Past Surgical History:  Procedure Laterality Date  . ABDOMINAL HYSTERECTOMY    . TONSILLECTOMY AND ADENOIDECTOMY  1947  . tummy tuck      Family History  Problem Relation Age of Onset  . Stroke Mother   . Heart attack Mother   . Arthritis Mother   . Hyperlipidemia Mother   . Heart disease Mother   . Diabetes Mother   . Diabetes Sister   . Cancer Daughter   . Heart disease Daughter   . Diabetes Daughter   . Diabetes Son   . Diabetes Maternal Grandmother   . Diabetes Maternal Grandfather     SOCIAL HX: Non-smoker   Current Outpatient Medications:  .  aspirin EC 81 MG tablet,  Take 1 tablet (81 mg total) by mouth daily. Swallow whole., Disp: 90 tablet, Rfl: 3 .  atorvastatin (LIPITOR) 40 MG tablet, TAKE 1 TABLET EVERY DAY, Disp: 90 tablet, Rfl: 1 .  CVS D3 50 MCG (2000 UT) CAPS, TAKE 1 CAPSULE EVERY DAY, Disp: 100 capsule, Rfl: 3 .  fluticasone (FLONASE) 50 MCG/ACT nasal spray, USE 2 SPRAYS IN EACH NOSTRIL EVERY DAY, Disp: 48 g, Rfl: 1 .  IBU 400 MG tablet, TAKE ONE TABLET BY MOUTH EVERY 8 HOURS AS NEEDED FOR MILD PAIN, Disp: 30 tablet, Rfl: 0 .  metoprolol succinate (TOPROL XL) 25 MG 24 hr tablet, Take 0.5 tablets  (12.5 mg total) by mouth daily., Disp: 45 tablet, Rfl: 3 .  montelukast (SINGULAIR) 10 MG tablet, TAKE 1 TABLET EVERY DAY, Disp: 90 tablet, Rfl: 1 .  vitamin E 400 UNIT capsule, Take 400 Units by mouth daily., Disp: , Rfl:   EXAM: This is a telephone visit and thus no physical exam was completed.  ASSESSMENT AND PLAN:  Discussed the following assessment and plan:  Atrial fibrillation ([place]) Overall doing well.  They have declined anticoagulation.  They will monitor.  Obstructive sleep apnea on CPAP They have declined using a CPAP.  Right hydronephrosis Resolved on recent imaging.  Lung nodule Recent imaging revealed stability.  Recommended 1 year follow-up.  Hip stiffness, left Chronic issue.  I do think physical therapy would be a good idea though with the current spike in COVID-19 cases we have opted to defer this to a future date.  They will let us know when they are ready to complete this.  Memory difficulty Refer to neurology.   Health maintenance: Patient and her daughter report that the patient had 1 Covid vaccine when one of the patient's friends took her to be vaccinated without her realizing what she got.  They have deferred further vaccination as the patient has few exposures.  I encouraged them to get the second vaccine as she likely does not have adequate protection from the first vaccine or her prior COVID-19 infection.  Discussed that any exposure puts her at risk for contracting Covid and that she could get quite sick with COVID-19 if she got this moving forward.  They continue to decline this.  Orders Placed This Encounter  Procedures  . Ambulatory referral to Neurology    Referral Priority:   Routine    Referral Type:   Consultation    Referral Reason:   Specialty Services Required    Requested Specialty:   Neurology    Number of Visits Requested:   1    No orders of the defined types were placed in this encounter.    I discussed the assessment and  treatment plan with the patient. The patient was provided an opportunity to ask questions and all were answered. The patient agreed with the plan and demonstrated an understanding of the instructions.   The patient was advised to call back or seek an in-person evaluation if the symptoms worsen or if the condition fails to improve as anticipated.  I provided 13 minutes of non-face-to-face time during this encounter.   [name], MD

## 2020-04-12 NOTE — Assessment & Plan Note (Signed)
Recent imaging revealed stability.  Recommended 1 year follow-up.

## 2020-04-12 NOTE — Assessment & Plan Note (Signed)
Refer to neurology

## 2020-04-12 NOTE — Assessment & Plan Note (Signed)
They have declined using a CPAP.

## 2020-04-12 NOTE — Assessment & Plan Note (Signed)
Chronic issue.  I do think physical therapy would be a good idea though with the current spike in COVID-19 cases we have opted to defer this to a future date.  They will let us know when they are ready to complete this.

## 2020-04-12 NOTE — Assessment & Plan Note (Signed)
Resolved on recent imaging.

## 2020-04-12 NOTE — Assessment & Plan Note (Signed)
Overall doing well.  They have declined anticoagulation.  They will monitor.

## 2020-04-15 ENCOUNTER — Telehealth: Payer: Self-pay

## 2020-04-15 NOTE — Telephone Encounter (Signed)
Pt's daughter returned call and said pt saw Dr. Caryl Bis on 8/27 and she doesn't need antibiotic.

## 2020-04-16 ENCOUNTER — Telehealth: Payer: Self-pay | Admitting: Family Medicine

## 2020-04-16 NOTE — Telephone Encounter (Signed)
Patient does not know why she needs this appt and will talk with [facility] office before she sch thank you for the referral

## 2020-04-17 NOTE — Telephone Encounter (Signed)
We discussed the referral to neurology at her last visit. She was to see them to evaluate her memory to help determine if she needed any medication treatment to help with memory.

## 2020-04-18 NOTE — Telephone Encounter (Signed)
I called the daughter of the patient and what happened was neurology called the home phone and the patient answered and because of her short term memory loss she did not remember the conversation with the provider about neurology, I informed the patient's daughter of the phone number to neurology to call and schedule about her memory loss and she informed me to take the home phone number out of the system and that is what I did to not deal with this issues in the future.  Shayley Medlin,cma

## 2020-04-23 ENCOUNTER — Other Ambulatory Visit: Payer: Self-pay

## 2020-04-23 ENCOUNTER — Emergency Department: Payer: Medicare HMO

## 2020-04-23 ENCOUNTER — Encounter: Payer: Self-pay | Admitting: Emergency Medicine

## 2020-04-23 ENCOUNTER — Inpatient Hospital Stay
Admission: EM | Admit: 2020-04-23 | Discharge: 2020-04-25 | DRG: 308 | Disposition: A | Discharge status: Home / Self Care | Payer: Medicare HMO | Attending: Internal Medicine | Admitting: Internal Medicine

## 2020-04-23 ENCOUNTER — Encounter: Payer: Self-pay | Admitting: Family

## 2020-04-23 ENCOUNTER — Inpatient Hospital Stay (HOSPITAL_COMMUNITY)
Admit: 2020-04-23 | Discharge: 2020-04-23 | Disposition: A | Discharge status: Home / Self Care | Payer: Medicare HMO | Attending: Internal Medicine | Admitting: Internal Medicine

## 2020-04-23 ENCOUNTER — Ambulatory Visit: Payer: Medicare HMO

## 2020-04-23 ENCOUNTER — Ambulatory Visit (INDEPENDENT_AMBULATORY_CARE_PROVIDER_SITE_OTHER): Payer: Medicare HMO | Admitting: Family

## 2020-04-23 VITALS — BP 110/70 | HR 68 | Temp 97.5°F

## 2020-04-23 DIAGNOSIS — I4510 Unspecified right bundle-branch block: Secondary | ICD-10-CM | POA: Diagnosis present

## 2020-04-23 DIAGNOSIS — F0390 Unspecified dementia without behavioral disturbance: Secondary | ICD-10-CM | POA: Diagnosis present

## 2020-04-23 DIAGNOSIS — I509 Heart failure, unspecified: Secondary | ICD-10-CM

## 2020-04-23 DIAGNOSIS — R410 Disorientation, unspecified: Secondary | ICD-10-CM

## 2020-04-23 DIAGNOSIS — Z7982 Long term (current) use of aspirin: Secondary | ICD-10-CM | POA: Diagnosis not present

## 2020-04-23 DIAGNOSIS — R001 Bradycardia, unspecified: Secondary | ICD-10-CM | POA: Diagnosis not present

## 2020-04-23 DIAGNOSIS — F0391 Unspecified dementia with behavioral disturbance: Secondary | ICD-10-CM | POA: Diagnosis not present

## 2020-04-23 DIAGNOSIS — I340 Nonrheumatic mitral (valve) insufficiency: Secondary | ICD-10-CM | POA: Diagnosis present

## 2020-04-23 DIAGNOSIS — I5023 Acute on chronic systolic (congestive) heart failure: Secondary | ICD-10-CM | POA: Diagnosis not present

## 2020-04-23 DIAGNOSIS — I429 Cardiomyopathy, unspecified: Secondary | ICD-10-CM | POA: Diagnosis present

## 2020-04-23 DIAGNOSIS — Z20822 Contact with and (suspected) exposure to covid-19: Secondary | ICD-10-CM | POA: Diagnosis not present

## 2020-04-23 DIAGNOSIS — Z8249 Family history of ischemic heart disease and other diseases of the circulatory system: Secondary | ICD-10-CM

## 2020-04-23 DIAGNOSIS — J9601 Acute respiratory failure with hypoxia: Secondary | ICD-10-CM

## 2020-04-23 DIAGNOSIS — Z888 Allergy status to other drugs, medicaments and biological substances status: Secondary | ICD-10-CM | POA: Diagnosis not present

## 2020-04-23 DIAGNOSIS — J810 Acute pulmonary edema: Secondary | ICD-10-CM

## 2020-04-23 DIAGNOSIS — I4891 Unspecified atrial fibrillation: Secondary | ICD-10-CM | POA: Diagnosis present

## 2020-04-23 DIAGNOSIS — E785 Hyperlipidemia, unspecified: Secondary | ICD-10-CM | POA: Diagnosis not present

## 2020-04-23 DIAGNOSIS — Z79899 Other long term (current) drug therapy: Secondary | ICD-10-CM

## 2020-04-23 DIAGNOSIS — I5033 Acute on chronic diastolic (congestive) heart failure: Secondary | ICD-10-CM | POA: Diagnosis not present

## 2020-04-23 DIAGNOSIS — G4733 Obstructive sleep apnea (adult) (pediatric): Secondary | ICD-10-CM | POA: Diagnosis present

## 2020-04-23 DIAGNOSIS — E7800 Pure hypercholesterolemia, unspecified: Secondary | ICD-10-CM | POA: Diagnosis present

## 2020-04-23 DIAGNOSIS — J9811 Atelectasis: Secondary | ICD-10-CM | POA: Diagnosis not present

## 2020-04-23 DIAGNOSIS — I361 Nonrheumatic tricuspid (valve) insufficiency: Secondary | ICD-10-CM | POA: Diagnosis not present

## 2020-04-23 DIAGNOSIS — Z83438 Family history of other disorder of lipoprotein metabolism and other lipidemia: Secondary | ICD-10-CM

## 2020-04-23 DIAGNOSIS — I248 Other forms of acute ischemic heart disease: Secondary | ICD-10-CM | POA: Diagnosis not present

## 2020-04-23 DIAGNOSIS — Z885 Allergy status to narcotic agent status: Secondary | ICD-10-CM | POA: Diagnosis not present

## 2020-04-23 DIAGNOSIS — E876 Hypokalemia: Secondary | ICD-10-CM | POA: Diagnosis not present

## 2020-04-23 DIAGNOSIS — Z833 Family history of diabetes mellitus: Secondary | ICD-10-CM

## 2020-04-23 DIAGNOSIS — Z823 Family history of stroke: Secondary | ICD-10-CM

## 2020-04-23 DIAGNOSIS — Z87440 Personal history of urinary (tract) infections: Secondary | ICD-10-CM | POA: Diagnosis not present

## 2020-04-23 DIAGNOSIS — I959 Hypotension, unspecified: Secondary | ICD-10-CM | POA: Diagnosis present

## 2020-04-23 DIAGNOSIS — Z8261 Family history of arthritis: Secondary | ICD-10-CM | POA: Diagnosis not present

## 2020-04-23 DIAGNOSIS — I480 Paroxysmal atrial fibrillation: Secondary | ICD-10-CM | POA: Diagnosis not present

## 2020-04-23 DIAGNOSIS — I110 Hypertensive heart disease with heart failure: Secondary | ICD-10-CM | POA: Diagnosis not present

## 2020-04-23 DIAGNOSIS — Z809 Family history of malignant neoplasm, unspecified: Secondary | ICD-10-CM

## 2020-04-23 DIAGNOSIS — R778 Other specified abnormalities of plasma proteins: Secondary | ICD-10-CM

## 2020-04-23 DIAGNOSIS — R000 Tachycardia, unspecified: Secondary | ICD-10-CM | POA: Diagnosis not present

## 2020-04-23 DIAGNOSIS — I10 Essential (primary) hypertension: Secondary | ICD-10-CM | POA: Diagnosis not present

## 2020-04-23 DIAGNOSIS — Z881 Allergy status to other antibiotic agents status: Secondary | ICD-10-CM

## 2020-04-23 DIAGNOSIS — I517 Cardiomegaly: Secondary | ICD-10-CM | POA: Diagnosis not present

## 2020-04-23 DIAGNOSIS — J811 Chronic pulmonary edema: Secondary | ICD-10-CM | POA: Diagnosis not present

## 2020-04-23 LAB — APTT: aPTT: <24 seconds — ABNORMAL LOW (ref 24–36)

## 2020-04-23 LAB — CBC
HCT: 43.9 % (ref 36.0–46.0)
HCT: 39.4 % (ref 36.0–46.0)
Hemoglobin: 15.2 g/dL — ABNORMAL HIGH (ref 12.0–15.0)
Hemoglobin: 13.8 g/dL (ref 12.0–15.0)
MCH: 32.3 pg (ref 26.0–34.0)
MCH: 32.5 pg (ref 26.0–34.0)
MCHC: 34.6 g/dL (ref 30.0–36.0)
MCHC: 35 g/dL (ref 30.0–36.0)
MCV: 93.2 fL (ref 80.0–100.0)
MCV: 92.9 fL (ref 80.0–100.0)
Platelets: 269 10*3/uL (ref 150–400)
Platelets: 237 10*3/uL (ref 150–400)
RBC: 4.71 MIL/uL (ref 3.87–5.11)
RBC: 4.24 MIL/uL (ref 3.87–5.11)
RDW: 12.8 % (ref 11.5–15.5)
RDW: 12.9 % (ref 11.5–15.5)
WBC: 8.5 10*3/uL (ref 4.0–10.5)
WBC: 7.9 10*3/uL (ref 4.0–10.5)
nRBC: 0 % (ref 0.0–0.2)
nRBC: 0 % (ref 0.0–0.2)

## 2020-04-23 LAB — URINALYSIS, COMPLETE (UACMP) WITH MICROSCOPIC
Bilirubin Urine: NEGATIVE
Glucose, UA: NEGATIVE mg/dL
Ketones, ur: 5 mg/dL — AB
Leukocytes,Ua: NEGATIVE
Nitrite: NEGATIVE
Protein, ur: 100 mg/dL — AB
Specific Gravity, Urine: 1.026 (ref 1.005–1.030)
pH: 5 (ref 5.0–8.0)

## 2020-04-23 LAB — BASIC METABOLIC PANEL
Anion gap: 11 (ref 5–15)
BUN: 22 mg/dL (ref 8–23)
CO2: 21 mmol/L — ABNORMAL LOW (ref 22–32)
Calcium: 9.3 mg/dL (ref 8.9–10.3)
Chloride: 104 mmol/L (ref 98–111)
Creatinine, Ser: 0.95 mg/dL (ref 0.44–1.00)
GFR calc Af Amer: >60 mL/min (ref >60)
GFR calc non Af Amer: 55 mL/min — ABNORMAL LOW (ref >60)
Glucose, Bld: 113 mg/dL — ABNORMAL HIGH (ref 70–99)
Potassium: 3.5 mmol/L (ref 3.5–5.1)
Sodium: 136 mmol/L (ref 135–145)

## 2020-04-23 LAB — SARS CORONAVIRUS 2 BY RT PCR (HOSPITAL ORDER, PERFORMED IN ~~LOC~~ HOSPITAL LAB): SARS Coronavirus 2: NEGATIVE

## 2020-04-23 LAB — PROTIME-INR
INR: 1.1 (ref 0.8–1.2)
Prothrombin Time: 14.2 seconds (ref 11.4–15.2)

## 2020-04-23 LAB — TROPONIN I (HIGH SENSITIVITY)
Troponin I (High Sensitivity): 532 ng/L (ref <18)
Troponin I (High Sensitivity): 478 ng/L (ref <18)

## 2020-04-23 LAB — CREATININE, SERUM
Creatinine, Ser: 1.05 mg/dL — ABNORMAL HIGH (ref 0.44–1.00)
GFR calc Af Amer: 56 mL/min — ABNORMAL LOW (ref >60)
GFR calc non Af Amer: 49 mL/min — ABNORMAL LOW (ref >60)

## 2020-04-23 LAB — BRAIN NATRIURETIC PEPTIDE: B Natriuretic Peptide: 1599.1 pg/mL — ABNORMAL HIGH (ref 0.0–100.0)

## 2020-04-23 LAB — MAGNESIUM: Magnesium: 2 mg/dL (ref 1.7–2.4)

## 2020-04-23 MED ORDER — ASPIRIN EC 81 MG PO TBEC
81.0000 mg | DELAYED_RELEASE_TABLET | Freq: Every day | ORAL | Status: DC
  Administered 2020-04-24 – 2020-04-25 (×2): 81 mg via ORAL
  Filled 2020-04-23 (×2): qty 1

## 2020-04-23 MED ORDER — POLYETHYLENE GLYCOL 3350 17 G PO PACK: 17.0000 g | PACK | Freq: Every day | ORAL | Status: DC | PRN

## 2020-04-23 MED ORDER — SODIUM CHLORIDE 0.9% FLUSH
3.0000 mL | Freq: Two times a day (BID) | INTRAVENOUS | Status: DC
  Administered 2020-04-24 – 2020-04-25 (×3): 3 mL via INTRAVENOUS

## 2020-04-23 MED ORDER — ONDANSETRON HCL 4 MG/2ML IJ SOLN: 4.0000 mg | Freq: Four times a day (QID) | INTRAMUSCULAR | Status: DC | PRN

## 2020-04-23 MED ORDER — HEPARIN (PORCINE) 25000 UT/250ML-% IV SOLN
1050.0000 [IU]/h | INTRAVENOUS | Status: DC
  Administered 2020-04-23: 1050 [IU]/h via INTRAVENOUS
  Filled 2020-04-23: qty 250

## 2020-04-23 MED ORDER — SODIUM CHLORIDE 0.9 % IV BOLUS
1000.0000 mL | Freq: Once | INTRAVENOUS | Status: AC
  Administered 2020-04-23: 1000 mL via INTRAVENOUS

## 2020-04-23 MED ORDER — SODIUM CHLORIDE 0.9 % IV SOLN: 250.0000 mL | INTRAVENOUS | Status: DC | PRN

## 2020-04-23 MED ORDER — DILTIAZEM HCL-DEXTROSE 125-5 MG/125ML-% IV SOLN (PREMIX)
5.0000 mg/h | INTRAVENOUS | Status: DC
  Administered 2020-04-23: 5 mg/h via INTRAVENOUS
  Filled 2020-04-23: qty 125

## 2020-04-23 MED ORDER — DOCUSATE SODIUM 100 MG PO CAPS: 100.0000 mg | ORAL_CAPSULE | Freq: Two times a day (BID) | ORAL | Status: DC | PRN

## 2020-04-23 MED ORDER — HEPARIN SODIUM (PORCINE) 5000 UNIT/ML IJ SOLN: 5000.0000 [IU] | Freq: Three times a day (TID) | INTRAMUSCULAR | Status: DC

## 2020-04-23 MED ORDER — FUROSEMIDE 10 MG/ML IJ SOLN
20.0000 mg | Freq: Once | INTRAMUSCULAR | Status: AC
  Administered 2020-04-23: 20 mg via INTRAVENOUS
  Filled 2020-04-23: qty 4

## 2020-04-23 MED ORDER — ACETAMINOPHEN 325 MG PO TABS: 650.0000 mg | ORAL_TABLET | ORAL | Status: DC | PRN

## 2020-04-23 MED ORDER — DIGOXIN 0.25 MG/ML IJ SOLN
0.1250 mg | Freq: Once | INTRAMUSCULAR | Status: DC
  Filled 2020-04-23: qty 2

## 2020-04-23 MED ORDER — SODIUM CHLORIDE 0.9% FLUSH: 3.0000 mL | INTRAVENOUS | Status: DC | PRN

## 2020-04-23 MED ORDER — DIGOXIN 0.25 MG/ML IJ SOLN
0.2500 mg | INTRAMUSCULAR | Status: AC
  Administered 2020-04-23: 0.25 mg via INTRAVENOUS
  Filled 2020-04-23: qty 2

## 2020-04-23 MED ORDER — HEPARIN BOLUS VIA INFUSION
4500.0000 [IU] | Freq: Once | INTRAVENOUS | Status: AC
  Administered 2020-04-23: 4500 [IU] via INTRAVENOUS
  Filled 2020-04-23: qty 4500

## 2020-04-23 MED ORDER — ASPIRIN 81 MG PO CHEW
324.0000 mg | CHEWABLE_TABLET | Freq: Once | ORAL | Status: AC
  Administered 2020-04-23: 324 mg via ORAL
  Filled 2020-04-23: qty 4

## 2020-04-23 MED ORDER — FAMOTIDINE IN NACL 20-0.9 MG/50ML-% IV SOLN
20.0000 mg | Freq: Two times a day (BID) | INTRAVENOUS | Status: DC
  Administered 2020-04-24 (×2): 20 mg via INTRAVENOUS
  Filled 2020-04-23 (×2): qty 50

## 2020-04-23 MED ORDER — DILTIAZEM LOAD VIA INFUSION
10.0000 mg | Freq: Once | INTRAVENOUS | Status: AC
  Administered 2020-04-23: 10 mg via INTRAVENOUS
  Filled 2020-04-23: qty 10

## 2020-04-23 NOTE — ED Triage Notes (Signed)
Pt in via [facility] w/afib RVR 160-180's en route. Pt states she woke up "feeling funny". Daughter states the pt has similar episodes when she has UTI's. GCS 14, confused at situation at times. Denies any cp, n/v or sob. Given 5mg  Metoprolol en route. Arrives HR 110-120's afib, BP 101/86

## 2020-04-23 NOTE — Assessment & Plan Note (Addendum)
HR variable from 60 to 159. She is not labored in speech nor toxic in appearance. She ascended the exam table and denied feeling dizziness. Benign abdominal exam. Unable to obtain a urine. EKG shows Atrial fib with RVR , known RBBB as seen on last EKG with cardiologist Caitlin/Dr [facility] 02/2020. Patient has h/o atrial fib with RVR in setting of URI in 04/2013. Called 911 and given patient 2L [place]. Patient is being taken to [facility].Attempted to give triage report however unable to reach triage by phone with [phone].
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
## 2020-04-23 NOTE — Progress Notes (Signed)
Subjective:    Patient ID: Jill Snow, Snow    DOB: 05/23/1934, 86 y.o.   MRN: [id number]  CC: Jill Snow who presents today for an acute visit.    HPI: Accompanied by daughter. Patient lives with brother.   Felt she had confusion which started yesterday. Concern for UTI as this is how she presented the last time.   Last UTI 3 months ago.  Worried about dehydration. She is unable to urinate. Doesn't eat alone so waits for son to come home. Had toast with butter today. Unsure of last BM. Weight has been stable.   Noticed that she was 'out of breath' and she felt dizzy walking into office.   Per daughter , she has 'short memory loss.'  Didn't sleep last night as she felt has may be hallucinating which she denies ever happened.   No cough, fever, nasal congestion, HA, leg swelling, dizziness, syncope, leg swelling.               Repeat urine 2 months ago, no blood seen.   Pending appointment with neurology  HISTORY:  Past Medical History:  Diagnosis Date  . Atrial fibrillation ([place])   . Atrial flutter ([place])    a. s/p successful TEE/DCCV in 2014; b. TEE 2014 showed EF 45-50%, mild bi-atrial enlargement, mod MR  . Dyspnea   . History of chicken pox   . Hypercholesterolemia   . Hypertension   . Mitral regurgitation    a. echo 2014: EF 40-45%, mildly dilated RV, mod reduced RV systolic fxn, mod dilated LA, Mild to mod MR, mod TR, mildly elevated PASP  . PAF (paroxysmal atrial fibrillation) ([place])    a. initial episode 2011; b. not on long term anticoagulation since 06/2013  . RBBB (right bundle branch block)   . Seasonal allergies   . Sleep apnea    a. on CPAP   Past Surgical History:  Procedure Laterality Date  . ABDOMINAL HYSTERECTOMY    . TONSILLECTOMY AND ADENOIDECTOMY  1947  . tummy tuck     Family History  Problem Relation Age of Onset  . Stroke Mother   . Heart attack Mother   . Arthritis Mother   . Hyperlipidemia  Mother   . Heart disease Mother   . Diabetes Mother   . Diabetes Sister   . Cancer Daughter   . Heart disease Daughter   . Diabetes Daughter   . Diabetes Son   . Diabetes Maternal Grandmother   . Diabetes Maternal Grandfather     Allergies: Codeine, Augmentin [amoxicillin-pot clavulanate], and Crestor [rosuvastatin] Current Outpatient Medications on File Prior to Visit  Medication Sig Dispense Refill  . aspirin EC 81 MG tablet Take 1 tablet (81 mg total) by mouth daily. Swallow whole. 90 tablet 3  . atorvastatin (LIPITOR) 40 MG tablet TAKE 1 TABLET EVERY DAY 90 tablet 1  . CVS D3 50 MCG (2000 UT) CAPS TAKE 1 CAPSULE EVERY DAY 100 capsule 3  . fluticasone (FLONASE) 50 MCG/ACT nasal spray USE 2 SPRAYS IN EACH NOSTRIL EVERY DAY 48 g 1  . IBU 400 MG tablet TAKE ONE TABLET BY MOUTH EVERY 8 HOURS AS NEEDED FOR MILD PAIN 30 tablet 0  . metoprolol succinate (TOPROL XL) 25 MG 24 hr tablet Take 0.5 tablets (12.5 mg total) by mouth daily. 45 tablet 3  . montelukast (SINGULAIR) 10 MG tablet TAKE 1 TABLET EVERY DAY 90 tablet 1  . vitamin E  400 UNIT capsule Take 400 Units by mouth daily.     No current facility-administered medications on file prior to visit.    Social History   Tobacco Use  . Smoking status: Never Smoker  . Smokeless tobacco: Never Used  Vaping Use  . Vaping Use: Never used  Substance Use Topics  . Alcohol use: No  . Drug use: No    Review of Systems  Constitutional: Negative for chills and fever.  HENT: Negative for congestion.   Respiratory: Positive for shortness of breath. Negative for cough and wheezing.   Cardiovascular: Negative for chest pain, palpitations and leg swelling.  Gastrointestinal: Negative for abdominal distention, constipation, nausea and vomiting.  Genitourinary: Positive for difficulty urinating.  Neurological: Positive for dizziness.  Psychiatric/Behavioral: Positive for confusion.      Objective:    BP 110/70   Pulse 68   Temp (!)  97.5 F (36.4 C) (Oral)   SpO2 99%   BP Readings from Last 3 Encounters:  04/23/20 110/70  04/12/20 115/65  03/04/20 118/66    Physical Exam Vitals reviewed.  Constitutional:      Appearance: She is well-developed.  HENT:     Head:     Comments: Mouth is moist.     Mouth/Throat:     Pharynx: Uvula midline.  Cardiovascular:     Rate and Rhythm: Normal rate and regular rhythm.     Pulses: Normal pulses.     Heart sounds: Normal heart sounds.  Pulmonary:     Effort: Pulmonary effort is normal.     Breath sounds: Normal breath sounds. No wheezing, rhonchi or rales.  Musculoskeletal:     Right lower leg: No edema.     Left lower leg: No edema.  Skin:    General: Skin is warm and dry.  Neurological:     Mental Status: She is alert.     Comments:  Alert to place, self. Not alert to date. Gait strong and steady.  Psychiatric:        Speech: Speech normal.        Behavior: Behavior normal.        Thought Content: Thought content normal.        Assessment & Plan:   Problem List Items Addressed This Visit      Cardiovascular and Mediastinum   Atrial fibrillation with rapid ventricular response ([place])    HR variable from 60 to 159. She is not labored in speech nor toxic in appearance. She ascended the exam table and denied feeling dizziness. Benign abdominal exam. Unable to obtain a urine. EKG shows Atrial fib with RVR , known RBBB as seen on last EKG with cardiologist Caitlin/Dr [facility] 02/2020. Patient has h/o atrial fib with RVR in setting of URI in 04/2013. Called 911 and given patient 2L [place]. Patient is being taken to [facility].Attempted to give triage report however unable to reach triage by phone with [phone].        Other Visit Diagnoses    Confusion    -  Primary   Relevant Orders   POCT Urinalysis Dipstick   Urine Microscopic Only   Urine Culture        I am having Mardene Celeste A. Stirewalt maintain her IBU, CVS D3, vitamin E, aspirin EC, montelukast, fluticasone,  atorvastatin, and metoprolol succinate.   No orders of the defined types were placed in this encounter.   Return precautions given.   Risks, benefits, and alternatives of the medications and  treatment plan prescribed today were discussed, and patient expressed understanding.   Education regarding symptom management and diagnosis given to patient on AVS.  Continue to follow with Leone Haven, MD for routine health maintenance.   Jill Snow and I agreed with plan.   Mable Paris, FNP

## 2020-04-23 NOTE — Progress Notes (Signed)
ANTICOAGULATION CONSULT NOTE  Pharmacy Consult for heparin Indication: atrial fibrillation  Allergies  Allergen Reactions   Codeine Anaphylaxis    "cant remember reaction"   Augmentin [Amoxicillin-Pot Clavulanate] Nausea And Vomiting   Crestor [Rosuvastatin] Other (See Comments)    Muscle pain/cramps     Patient Measurements:   Heparin Dosing Weight: 77 kg  Vital Signs: Temp: 97.5 F (36.4 C) (09/07 1202) Temp Source: Oral (09/07 1202) BP: 107/78 (09/07 1700) Pulse Rate: 89 (09/07 1700)  Labs: Recent Labs    04/23/20 1321 04/23/20 1416 04/23/20 1727  HGB 15.2*  --  13.8  HCT 43.9  --  39.4  PLT 269  --  237  CREATININE 0.95  --  1.05*  TROPONINIHS  --  532*  --     Estimated Creatinine Clearance: 43.6 mL/min (A) (by C-G formula based on SCr of 1.05 mg/dL (H)).   Medical History: Past Medical History:  Diagnosis Date   Atrial fibrillation [facility])    Atrial flutter ([place])    a. s/p successful TEE/DCCV in 2014; b. TEE 2014 showed EF 45-50%, mild bi-atrial enlargement, mod MR   Dyspnea    History of chicken pox    Hypercholesterolemia    Hypertension    Mitral regurgitation    a. echo 2014: EF 40-45%, mildly dilated RV, mod reduced RV systolic fxn, mod dilated LA, Mild to mod MR, mod TR, mildly elevated PASP   PAF (paroxysmal atrial fibrillation) ([place])    a. initial episode 2011; b. not on long term anticoagulation since 06/2013   RBBB (right bundle branch block)    Seasonal allergies    Sleep apnea    a. on CPAP     Assessment: 86 year old female admitted with atrial fibrillation. Cardiology consulted. Pharmacy consult for heparin.  Goal of Therapy:  Heparin level 0.3-0.7 units/ml Monitor platelets by anticoagulation protocol: Yes   Plan:  Heparin 4500 unit bolus followed by heparin drip at 1050 units/hr. HL 9/8 at 0300. CBC daily while on heparin drip.  Tawnya Crook, PharmD 04/23/2020,6:23 PM

## 2020-04-23 NOTE — Consult Note (Signed)
Cardiology Consultation:   Patient ID: Jill Snow MRN: [id number]; DOB: 12/29/1933  Admit date: 04/23/2020 Date of Consult: 04/23/2020  Primary Care Provider: Leone Haven, MD [facility] Cardiologist: Ida Rogue, MD  [place] Electrophysiologist:  None    Patient Profile:   Jill Snow is a 86 y.o. female with a hx of paroxysmal atrial fibrillation/flutter, hypertension, hyperlipidemia, and cardiomyopathy with moderately reduced LVEF by echo 2014, who is being seen today for the evaluation of atrial fibrillation with rapid ventricular response and hypotension at the request of Dr. Cheri Fowler.  History of Present Illness:   Jill Snow was in her usual state of health until yesterday when she began to have some generalized malaise.  In hindsight, she also had an episode of abdominal pain after eating a large meal late last week.  Yesterday afternoon, her family noted Jill Snow to be confused.  When her daughter spoke with her this morning, she was even hallucinating at times, which has occurred in the past in the setting of urinary tract infections.  She therefore went to her PCPs office and was found to be in atrial fibrillation with rapid ventricular response.  She was therefore referred to the emergency department via EMS.  In route, she received IV metoprolol and diltiazem for rate control with subsequent hypotension.  Ventricular rates have improved, hypotension was slow to resolve, prompting consultation with the critical care service for admission to the ICU.  Currently, Ms. Cogan reports that she feels well.  She has noticed some shortness of breath over the last day or 2 as well as a cough without significant sputum production.  She is also noticed recurrent abdominal pain today, which he has a hard time localizing.  Her daughter also mentions that Jill Snow has complained of nausea at times.  She has not had any fevers or chills.  She also denies  chest pain, palpitations, lightheadedness, and edema.  She is not currently on anticoagulation, as she had a bad reaction to rivaroxaban several years ago.   Past Medical History:  Diagnosis Date  . Atrial fibrillation ([place])   . Atrial flutter ([place])    a. s/p successful TEE/DCCV in 2014; b. TEE 2014 showed EF 45-50%, mild bi-atrial enlargement, mod MR  . Dyspnea   . History of chicken pox   . Hypercholesterolemia   . Hypertension   . Mitral regurgitation    a. echo 2014: EF 40-45%, mildly dilated RV, mod reduced RV systolic fxn, mod dilated LA, Mild to mod MR, mod TR, mildly elevated PASP  . PAF (paroxysmal atrial fibrillation) ([place])    a. initial episode 2011; b. not on long term anticoagulation since 06/2013  . RBBB (right bundle branch block)   . Seasonal allergies   . Sleep apnea    a. on CPAP    Past Surgical History:  Procedure Laterality Date  . ABDOMINAL HYSTERECTOMY    . TONSILLECTOMY AND ADENOIDECTOMY  1947  . tummy tuck       Home Medications:  Prior to Admission medications   Medication Sig Start Date [place] Date Taking? Authorizing Provider  aspirin EC 81 MG tablet Take 1 tablet (81 mg total) by mouth daily. Swallow whole. 03/04/20   Minna Merritts, MD  atorvastatin (LIPITOR) 40 MG tablet TAKE 1 TABLET EVERY DAY 04/08/20   Leone Haven, MD  CVS D3 50 MCG [phone] UT) CAPS TAKE 1 CAPSULE EVERY DAY 12/07/18   Leone Haven, MD  fluticasone Asencion Islam)  50 MCG/ACT nasal spray USE 2 SPRAYS IN EACH NOSTRIL EVERY DAY 04/08/20   Leone Haven, MD  IBU 400 MG tablet TAKE ONE TABLET BY MOUTH EVERY 8 HOURS AS NEEDED FOR MILD PAIN 01/27/18   Leone Haven, MD  metoprolol succinate (TOPROL XL) 25 MG 24 hr tablet Take 0.5 tablets (12.5 mg total) by mouth daily. 04/10/20   Minna Merritts, MD  montelukast (SINGULAIR) 10 MG tablet TAKE 1 TABLET EVERY DAY 04/08/20   Leone Haven, MD  vitamin E 400 UNIT capsule Take 400 Units by mouth daily.    [provider]    Inpatient Medications: Scheduled Meds: . heparin  5,000 Units Subcutaneous Q8H  . sodium chloride flush  3 mL Intravenous Q12H   Continuous Infusions: . sodium chloride    . diltiazem (CARDIZEM) infusion Stopped (04/23/20 1712)  . famotidine (PEPCID) IV     PRN Meds: sodium chloride, acetaminophen, docusate sodium, ondansetron (ZOFRAN) IV, polyethylene glycol, sodium chloride flush  Allergies:    Allergies  Allergen Reactions  . Codeine Anaphylaxis    "cant remember reaction"  . Augmentin [Amoxicillin-Pot Clavulanate] Nausea And Vomiting  . Crestor [Rosuvastatin] Other (See Comments)    Muscle pain/cramps     Social History:   Social History   Tobacco Use  . Smoking status: Never Smoker  . Smokeless tobacco: Never Used  Vaping Use  . Vaping Use: Never used  Substance Use Topics  . Alcohol use: No  . Drug use: No     Family History:   Family History  Problem Relation Age of Onset  . Stroke Mother   . Heart attack Mother   . Arthritis Mother   . Hyperlipidemia Mother   . Heart disease Mother   . Diabetes Mother   . Diabetes Sister   . Cancer Daughter   . Heart disease Daughter   . Diabetes Daughter   . Diabetes Son   . Diabetes Maternal Grandmother   . Diabetes Maternal Grandfather      ROS:  Please see the history of present illness. All other ROS reviewed and negative.     Physical Exam/Data:   Vitals:   04/23/20 1615 04/23/20 1630 04/23/20 1645 04/23/20 1700  BP: 96/71 96/65 103/76 107/78  Pulse: (!) 55 94 99 89  Resp: 20 (!) 24 (!) 23 17  SpO2: 100% 99% 100% 98%   No intake or output data in the 24 hours ending 04/23/20 1730 Last 3 Weights 04/23/2020 04/12/2020 03/04/2020  Weight (lbs) (No Data) 178 lb 175 lb 6 oz  Weight (kg) (No Data) 80.74 kg 79.55 kg     There is no height or weight on file to calculate BMI.  General:  Well nourished, well developed, in no acute distress.  Accompanied by her daughter. HEENT: normal Lymph: no  adenopathy Neck: JVP approximately 8 to 10 cm. Endocrine:  No thryomegaly Vascular: No carotid bruits; 1+ radial pulses bilateral. Cardiac: Irregularly irregular without murmurs, rubs, or gallops.  Heart sounds are distant. Lungs: Diminished breath sounds at both lung bases. Abd: soft, nontender, no hepatomegaly  Ext: Trace pretibial edema bilaterally. Musculoskeletal:  No deformities, BUE and BLE strength normal and equal Skin: warm and dry  Neuro:  CNs 2-12 intact, no focal abnormalities noted Psych:  Normal affect though forgetful and somewhat confused.  EKG:  The EKG was personally reviewed and demonstrates: Atrial fibrillation with rapid ventricular response and right bundle branch block. Telemetry:  Telemetry was personally reviewed  and demonstrates: Atrial fibrillation with ventricular rates of 80 to 120 bpm.  Relevant CV Studies: Echocardiogram (04/01/2017): - Left ventricle: The cavity size was normal. Systolic function was  normal. The estimated ejection fraction was in the range of 60%  to 65%. Wall motion was normal; there were no regional wall  motion abnormalities. Left ventricular diastolic function  parameters were normal.  - Left atrium: The atrium was mildly dilated.  - Right ventricle: Systolic function was normal.  - Pulmonary arteries: Systolic pressure was mildly elevated. PA  peak pressure: 35 mm Hg (S).   Laboratory Data:  High Sensitivity Troponin:   Recent Labs  Lab 04/23/20 1416  TROPONINIHS 532*     Chemistry Recent Labs  Lab 04/23/20 1321  NA 136  K 3.5  CL 104  CO2 21*  GLUCOSE 113*  BUN 22  CREATININE 0.95  CALCIUM 9.3  GFRNONAA 55*  GFRAA >60  ANIONGAP 11    No results for input(s): PROT, ALBUMIN, AST, ALT, ALKPHOS, BILITOT in the last 168 hours. Hematology Recent Labs  Lab 04/23/20 1321  WBC 8.5  RBC 4.71  HGB 15.2*  HCT 43.9  MCV 93.2  MCH 32.3  MCHC 34.6  RDW 12.8  PLT 269   BNP Recent Labs  Lab  04/23/20 1322  BNP 1,599.1*    DDimer No results for input(s): DDIMER in the last 168 hours.   Radiology/Studies:  DG Chest Port 1 View  Result Date: 04/23/2020 CLINICAL DATA:  Shortness of breath, woke up feeling funny, history UTI, atrial fibrillation, hypertension EXAM: PORTABLE CHEST 1 VIEW COMPARISON:  Portable exam 1424 hours compared to 01/30/2020 FINDINGS: Enlargement of cardiac silhouette with slight vascular congestion. Mediastinal contours normal. Atherosclerotic calcification aorta. Peribronchial thickening and accentuated interstitial markings in the perihilar regions question mild pulmonary edema. Subsegmental atelectasis at LEFT base. Remaining lungs clear. No gross pleural effusion or pneumothorax. Multilevel endplate spur formation thoracic spine and osseous demineralization noted. IMPRESSION: Enlargement of cardiac silhouette with pulmonary vascular congestion and question minimal perihilar edema. Subsegmental atelectasis LEFT base. Electronically Signed   By: Lavonia Dana M.D.   On: 04/23/2020 14:33    Assessment and Plan:   Paroxysmal atrial fibrillation patient is in atrial fibrillation albeit with better heart rate control.  Currently heart rates are in the 80s to low 100s with improving blood pressure.  Hold home metoprolol for now given hypotension.  If ventricular rates are consistently greater than 110 bpm at rest, consider initiating digoxin for rate control until blood pressure normalizes.  Initiate heparin infusion for anticoagulation.  Check TSH as well as continue work-up for other potential triggers of atrial fibrillation.  Acute heart failure: Patient with symptoms of heart failure as well as elevated BNP.  Hold IV fluids, with goal net even fluid balance overnight.  Consider gentle diuresis beginning tomorrow if blood pressure tolerates.  Obtain transthoracic echocardiogram.  Elevated troponin: Uncertain if this is supply-demand mismatch or  reflective of ACS.  Patient notes some shortness of breath as well as abdominal pain, which could reflect an anginal equivalent.  Trend troponin until it has peaked, then stop.  Heparin infusion, as above.  Add aspirin 324 mg x 1 followed by 81 mg daily.  Obtain transthoracic echocardiogram.  Determine need for ischemia evaluation based on troponin trend, symptoms, and echocardiogram during this admission.  Check lipid panel in AM.  For questions or updates, please contact [place] Please consult [email or web address] for contact info under [facility] Cardiology.  Signed, Harrell Gave  Godson Pollan, MD  04/23/2020 5:30 PM

## 2020-04-23 NOTE — H&P (Signed)
Name: Jill Snow MRN: [id number] DOB: 04/13/1934     CONSULTATION DATE: 04/23/2020  REFERRING MD : Cheri Fowler  CC: feeling bad  HPI:  86 yo +confusion which started yesterday.  Concern for UTI as this is how she presented the last time.    Last UTI 3 months ago.  + 'out of breath' and she felt dizzy walking into office.  + 'short memory loss.'  Didn't sleep last night as she felt has may be hallucinating which she denies ever happened.   No cough, fever, nasal congestion, HA, leg swelling, dizziness, syncope, leg swelling.    ER COURSE  AFIB WITH RVR 160's patient started on Cardizem drip--> BP dropped  Hospitalist contacted for admission and deferred to PCCM  Patient  Is alert and awake, follows commands No resp distress No acute distress      PAST MEDICAL HISTORY :   has a past medical history of Atrial fibrillation ([place]), Atrial flutter ([place]), Dyspnea, History of chicken pox, Hypercholesterolemia, Hypertension, Mitral regurgitation, PAF (paroxysmal atrial fibrillation) ([place]), RBBB (right bundle branch block), Seasonal allergies, and Sleep apnea.  has a past surgical history that includes Abdominal hysterectomy; tummy tuck; and Tonsillectomy and adenoidectomy (1947). Prior to Admission medications   Medication Sig Start Date End Date Taking? Authorizing Provider  aspirin EC 81 MG tablet Take 1 tablet (81 mg total) by mouth daily. Swallow whole. 03/04/20   Minna Merritts, MD  atorvastatin (LIPITOR) 40 MG tablet TAKE 1 TABLET EVERY DAY 04/08/20   Leone Haven, MD  [facility] D3 50 MCG [phone] [place]) CAPS TAKE 1 CAPSULE EVERY DAY 12/07/18   Leone Haven, MD  fluticasone [facility]) 50 MCG/ACT nasal spray USE 2 SPRAYS IN EACH NOSTRIL EVERY DAY 04/08/20   Leone Haven, MD  IBU 400 MG tablet TAKE ONE TABLET BY MOUTH EVERY 8 HOURS AS NEEDED FOR MILD PAIN 01/27/18   Leone Haven, MD  metoprolol succinate (TOPROL XL) 25 MG 24 hr tablet Take 0.5 tablets (12.5 mg  total) by mouth daily. 04/10/20   Minna Merritts, MD  montelukast (SINGULAIR) 10 MG tablet TAKE 1 TABLET EVERY DAY 04/08/20   Leone Haven, MD  vitamin E 400 UNIT capsule Take 400 Units by mouth daily.    [provider]   Allergies  Allergen Reactions  . Codeine Anaphylaxis    "cant remember reaction"  . Augmentin [Amoxicillin-Pot Clavulanate] Nausea And Vomiting  . Crestor [Rosuvastatin] Other (See Comments)    Muscle pain/cramps     FAMILY HISTORY:  family history includes Arthritis in her mother; Cancer in her daughter; Diabetes in her daughter, maternal grandfather, maternal grandmother, mother, sister, and son; Heart attack in her mother; Heart disease in her daughter and mother; Hyperlipidemia in her mother; Stroke in her mother. SOCIAL HISTORY:  reports that she has never smoked. She has never used smokeless tobacco. She reports that she does not drink alcohol and does not use drugs.    Review of Systems:  Gen:  Denies  fever, sweats, chills weigh loss  HEENT: Denies blurred vision, double vision, ear pain, eye pain, hearing loss, nose bleeds, sore throat Cardiac:  No dizziness, chest pain or heaviness, chest tightness,edema, No JVD Resp: cough, sputum production, shortness of breath,wheezing, hemoptysis,  Gi: swallowing difficulty, stomach pain, nausea or vomiting, diarrhea, constipation, bowel incontinence Gu:  Denies bladder incontinence, burning urine Ext:   Denies Joint pain, stiffness or swelling Skin: Denies  skin rash, easy bruising or bleeding  or hives Endoc:  Denies polyuria, polydipsia , polyphagia or weight change Psych:   Denies depression, insomnia or hallucinations   SUBJECTIVE No complaints at this time   VITAL SIGNS: Temp:  [97.5 F (36.4 C)] 97.5 F (36.4 C) (09/07 1202) Pulse Rate:  [68-96] 70 (09/07 1400) Resp:  [18-26] 19 (09/07 1400) BP: (79-113)/(66-101) 79/66 (09/07 1400) SpO2:  [97 %-99 %] 99 % (09/07 1400)     SpO2: 99  %    Physical Examination:   GENERAL:NAD, no fevers, chills, no weakness no fatigue HEAD: Normocephalic, atraumatic.  EYES: PERLA, EOMI No scleral icterus.  MOUTH: Moist mucosal membrane.  EAR, NOSE, THROAT: Clear without exudates. No external lesions.  NECK: Supple.  PULMONARY: faint crackles throughout, even, non labored  CARDIOVASCULAR: irregular irregular, no R/G  GASTROINTESTINAL: Soft, nontender, non distended. Positive bowel sounds.  MUSCULOSKELETAL: No swelling, clubbing, or edema.  NEUROLOGIC: alert and oriented, follows commands, PERRLA  SKIN: No ulceration, lesions, rashes, or cyanosis.  PSYCHIATRIC: Insight, judgment intact. depression -anxiety   MEDICATIONS: I have reviewed all medications and confirmed regimen as documented    CULTURE RESULTS   Recent Results (from the past 240 hour(s))  SARS Coronavirus 2 by RT PCR (hospital order, performed in [facility] hospital lab) Nasopharyngeal Nasopharyngeal Swab     Status: None   Collection Time: 04/23/20  2:41 PM   Specimen: Nasopharyngeal Swab  Result Value Ref Range Status   SARS Coronavirus 2 NEGATIVE NEGATIVE Final    Comment: (NOTE) SARS-CoV-2 target nucleic acids are NOT DETECTED.  The SARS-CoV-2 RNA is generally detectable in upper and lower respiratory specimens during the acute phase of infection. The lowest concentration of SARS-CoV-2 viral copies this assay can detect is 250 copies / mL. A negative result does not preclude SARS-CoV-2 infection and should not be used as the sole basis for treatment or other patient management decisions.  A negative result may occur with improper specimen collection / handling, submission of specimen other than nasopharyngeal swab, presence of viral mutation(s) within the areas targeted by this assay, and inadequate number of viral copies (<250 copies / mL). A negative result must be combined with clinical observations, patient history, and epidemiological  information.  Fact Sheet for Patients:   StrictlyIdeas.no  Fact Sheet for Healthcare Providers: BankingDealers.co.za  This test is not yet approved or  cleared by the [place] FDA and has been authorized for detection and/or diagnosis of SARS-CoV-2 by FDA under an Emergency Use Authorization (EUA).  This EUA will remain in effect (meaning this test can be used) for the duration of the COVID-19 declaration under Section 564(b)(1) of the Act, 21 U.S.C. section 360bbb-3(b)(1), unless the authorization is terminated or revoked sooner.  Performed at [facility], [address] [place], [place] [address]           IMAGING    DG Chest Port 1 View  Result Date: 04/23/2020 CLINICAL DATA:  Shortness of breath, woke up feeling funny, history UTI, atrial fibrillation, hypertension EXAM: PORTABLE CHEST 1 VIEW COMPARISON:  Portable exam 1424 hours compared to 01/30/2020 FINDINGS: Enlargement of cardiac silhouette with slight vascular congestion. Mediastinal contours normal. Atherosclerotic calcification aorta. Peribronchial thickening and accentuated interstitial markings in the perihilar regions question mild pulmonary edema. Subsegmental atelectasis at LEFT base. Remaining lungs clear. No gross pleural effusion or pneumothorax. Multilevel endplate spur formation thoracic spine and osseous demineralization noted. IMPRESSION: Enlargement of cardiac silhouette with pulmonary vascular congestion and question minimal perihilar edema. Subsegmental atelectasis LEFT  base. Electronically Signed   By: Lavonia  M.D.   On: 04/23/2020 14:33       ASSESSMENT AND PLAN SYNOPSIS  Afib with RVR Acute respiratory failure secondary to acute heart failure -follow up cardiology recs-if hr >110 bpm will initiate digoxin for rate control until bp normalizes and considering gentle diuresis in the am if bp tolerates -Continue heparin gtt -TSH  level, Echo, and urine culture results pending  -Trend troponin's  -Supplemental O2 for dyspnea and/or hypoxia    Patient/Family are satisfied with Plan of action and management. All questions answered  Corrin Parker, M.D.  [facility]  Medical Director [place] Director [facility] Department

## 2020-04-23 NOTE — ED Notes (Signed)
Pt consistently hypotensive while on Dilt gtt, MD informed. Gtt turned down to 2.5mg /hr

## 2020-04-24 ENCOUNTER — Inpatient Hospital Stay: Payer: Medicare HMO

## 2020-04-24 DIAGNOSIS — R778 Other specified abnormalities of plasma proteins: Secondary | ICD-10-CM | POA: Insufficient documentation

## 2020-04-24 DIAGNOSIS — I509 Heart failure, unspecified: Secondary | ICD-10-CM | POA: Insufficient documentation

## 2020-04-24 DIAGNOSIS — J810 Acute pulmonary edema: Secondary | ICD-10-CM | POA: Insufficient documentation

## 2020-04-24 DIAGNOSIS — I4891 Unspecified atrial fibrillation: Secondary | ICD-10-CM

## 2020-04-24 LAB — PHOSPHORUS: Phosphorus: 3.3 mg/dL (ref 2.5–4.6)

## 2020-04-24 LAB — CBC
HCT: 37.2 % (ref 36.0–46.0)
Hemoglobin: 12.9 g/dL (ref 12.0–15.0)
MCH: 32.8 pg (ref 26.0–34.0)
MCHC: 34.7 g/dL (ref 30.0–36.0)
MCV: 94.7 fL (ref 80.0–100.0)
Platelets: 156 10*3/uL (ref 150–400)
RBC: 3.93 MIL/uL (ref 3.87–5.11)
RDW: 12.8 % (ref 11.5–15.5)
WBC: 8 10*3/uL (ref 4.0–10.5)
nRBC: 0 % (ref 0.0–0.2)

## 2020-04-24 LAB — LIPID PANEL
Cholesterol: 224 mg/dL — ABNORMAL HIGH (ref 0–200)
HDL: 24 mg/dL — ABNORMAL LOW (ref >40)
LDL Cholesterol: 175 mg/dL — ABNORMAL HIGH (ref 0–99)
Total CHOL/HDL Ratio: 9.3 RATIO
Triglycerides: 123 mg/dL (ref <150)
VLDL: 25 mg/dL (ref 0–40)

## 2020-04-24 LAB — HEPATIC FUNCTION PANEL
ALT: 18 U/L (ref 0–44)
AST: 29 U/L (ref 15–41)
Albumin: 3.2 g/dL — ABNORMAL LOW (ref 3.5–5.0)
Alkaline Phosphatase: 47 U/L (ref 38–126)
Bilirubin, Direct: 0.4 mg/dL — ABNORMAL HIGH (ref 0.0–0.2)
Indirect Bilirubin: 1.6 mg/dL — ABNORMAL HIGH (ref 0.3–0.9)
Total Bilirubin: 2 mg/dL — ABNORMAL HIGH (ref 0.3–1.2)
Total Protein: 5.8 g/dL — ABNORMAL LOW (ref 6.5–8.1)

## 2020-04-24 LAB — BASIC METABOLIC PANEL
Anion gap: 6 (ref 5–15)
BUN: 18 mg/dL (ref 8–23)
CO2: 25 mmol/L (ref 22–32)
Calcium: 8.2 mg/dL — ABNORMAL LOW (ref 8.9–10.3)
Chloride: 105 mmol/L (ref 98–111)
Creatinine, Ser: 0.9 mg/dL (ref 0.44–1.00)
GFR calc Af Amer: >60 mL/min (ref >60)
GFR calc non Af Amer: 59 mL/min — ABNORMAL LOW (ref >60)
Glucose, Bld: 109 mg/dL — ABNORMAL HIGH (ref 70–99)
Potassium: 2.9 mmol/L — ABNORMAL LOW (ref 3.5–5.1)
Sodium: 136 mmol/L (ref 135–145)

## 2020-04-24 LAB — TSH: TSH: 1.347 u[IU]/mL (ref 0.350–4.500)

## 2020-04-24 LAB — MAGNESIUM: Magnesium: 1.5 mg/dL — ABNORMAL LOW (ref 1.7–2.4)

## 2020-04-24 LAB — HEPARIN LEVEL (UNFRACTIONATED): Heparin Unfractionated: 0.61 IU/mL (ref 0.30–0.70)

## 2020-04-24 LAB — TROPONIN I (HIGH SENSITIVITY): Troponin I (High Sensitivity): 505 ng/L (ref <18)

## 2020-04-24 LAB — POTASSIUM: Potassium: 4 mmol/L (ref 3.5–5.1)

## 2020-04-24 MED ORDER — AMIODARONE HCL IN DEXTROSE 360-4.14 MG/200ML-% IV SOLN
60.0000 mg/h | INTRAVENOUS | Status: AC
  Administered 2020-04-24: 60 mg/h via INTRAVENOUS
  Filled 2020-04-24: qty 200

## 2020-04-24 MED ORDER — HALOPERIDOL LACTATE 5 MG/ML IJ SOLN
2.5000 mg | Freq: Once | INTRAMUSCULAR | Status: AC
  Administered 2020-04-24: 2.5 mg via INTRAVENOUS

## 2020-04-24 MED ORDER — POTASSIUM CHLORIDE CRYS ER 20 MEQ PO TBCR
40.0000 meq | EXTENDED_RELEASE_TABLET | Freq: Once | ORAL | Status: AC
  Administered 2020-04-24: 40 meq via ORAL
  Filled 2020-04-24: qty 2

## 2020-04-24 MED ORDER — DIGOXIN 0.25 MG/ML IJ SOLN
0.1250 mg | INTRAMUSCULAR | Status: AC
  Administered 2020-04-24: 0.125 mg via INTRAVENOUS

## 2020-04-24 MED ORDER — AMIODARONE HCL IN DEXTROSE 360-4.14 MG/200ML-% IV SOLN
30.0000 mg/h | INTRAVENOUS | Status: DC
  Administered 2020-04-24: 30 mg/h via INTRAVENOUS
  Filled 2020-04-24: qty 200

## 2020-04-24 MED ORDER — MAGNESIUM SULFATE 4 GM/100ML IV SOLN
4.0000 g | Freq: Once | INTRAVENOUS | Status: AC
  Administered 2020-04-24: 4 g via INTRAVENOUS
  Filled 2020-04-24: qty 100

## 2020-04-24 MED ORDER — LORAZEPAM 2 MG/ML IJ SOLN
1.0000 mg | Freq: Once | INTRAMUSCULAR | Status: AC
  Administered 2020-04-24: 1 mg via INTRAVENOUS
  Filled 2020-04-24: qty 1

## 2020-04-24 MED ORDER — AMIODARONE HCL IN DEXTROSE 360-4.14 MG/200ML-% IV SOLN
30.0000 mg/h | INTRAVENOUS | Status: DC
  Administered 2020-04-24 – 2020-04-25 (×2): 30 mg/h via INTRAVENOUS
  Filled 2020-04-24 (×2): qty 200

## 2020-04-24 MED ORDER — APIXABAN 5 MG PO TABS
5.0000 mg | ORAL_TABLET | Freq: Two times a day (BID) | ORAL | Status: DC
  Administered 2020-04-24 – 2020-04-25 (×2): 5 mg via ORAL
  Filled 2020-04-24 (×2): qty 1

## 2020-04-24 MED ORDER — FUROSEMIDE 10 MG/ML IJ SOLN
20.0000 mg | Freq: Two times a day (BID) | INTRAMUSCULAR | Status: DC
  Administered 2020-04-25: 20 mg via INTRAVENOUS
  Filled 2020-04-24: qty 2

## 2020-04-24 MED ORDER — ATORVASTATIN CALCIUM 80 MG PO TABS
80.0000 mg | ORAL_TABLET | Freq: Every day | ORAL | Status: DC
  Administered 2020-04-24 – 2020-04-25 (×2): 80 mg via ORAL
  Filled 2020-04-24: qty 4
  Filled 2020-04-24 (×2): qty 1

## 2020-04-24 MED ORDER — POTASSIUM CHLORIDE CRYS ER 20 MEQ PO TBCR
40.0000 meq | EXTENDED_RELEASE_TABLET | Freq: Two times a day (BID) | ORAL | Status: AC
  Administered 2020-04-24: 40 meq via ORAL
  Filled 2020-04-24: qty 2

## 2020-04-24 MED ORDER — HALOPERIDOL LACTATE 5 MG/ML IJ SOLN
INTRAMUSCULAR | Status: AC
  Filled 2020-04-24: qty 1

## 2020-04-24 MED ORDER — LORAZEPAM 2 MG/ML IJ SOLN
2.0000 mg | Freq: Once | INTRAMUSCULAR | Status: AC
  Administered 2020-04-24: 2 mg via INTRAVENOUS
  Filled 2020-04-24: qty 1

## 2020-04-24 NOTE — ED Notes (Signed)
Pt attmepting to get out of bed. Disoriented x3. Redirection attempted with minimal success. Pt is pointing to things that aren't there, and disoriented to RN

## 2020-04-24 NOTE — ED Notes (Signed)
Wrapped IV in the R AC with Coban at this time.

## 2020-04-24 NOTE — ED Notes (Signed)
Pt currently sleeping at this time.

## 2020-04-24 NOTE — ED Notes (Signed)
Dr. Patel, MD at bedside. 

## 2020-04-24 NOTE — ED Notes (Signed)
Linen change, purwick replaced

## 2020-04-24 NOTE — Progress Notes (Signed)
[place] for heparin Indication: atrial fibrillation  Allergies  Allergen Reactions  . Augmentin [Amoxicillin-Pot Clavulanate] Nausea And Vomiting  . Codeine Anaphylaxis    "cant remember reaction"  . Crestor [Rosuvastatin] Other (See Comments)    Muscle pain/cramps     Patient Measurements:   Heparin Dosing Weight: 77 kg  Vital Signs: Temp: 98.9 F (37.2 C) (09/07 2000) Temp Source: Oral (09/07 2000) BP: 94/68 (09/08 0300) Pulse Rate: 101 (09/08 0300)  Labs: Recent Labs    04/23/20 1321 04/23/20 1321 04/23/20 1416 04/23/20 1727 04/23/20 1922 04/24/20 0024 04/24/20 0245  HGB 15.2*   < >  --  13.8  --   --  12.9  HCT 43.9  --   --  39.4  --   --  37.2  PLT 269  --   --  237  --   --  156  APTT  --   --   --   --  <24*  --   --   LABPROT  --   --   --   --  14.2  --   --   INR  --   --   --   --  1.1  --   --   HEPARINUNFRC  --   --   --   --   --   --  0.61  CREATININE 0.95  --   --  1.05*  --   --  0.90  TROPONINIHS  --   --  532*  --  478* 505*  --    < > = values in this interval not displayed.    Estimated Creatinine Clearance: 50.8 mL/min (by C-G formula based on SCr of 0.9 mg/dL).   Medical History: Past Medical History:  Diagnosis Date  . Atrial fibrillation ([place])   . Atrial flutter ([place])    a. s/p successful TEE/DCCV in 2014; b. TEE 2014 showed EF 45-50%, mild bi-atrial enlargement, mod MR  . Dyspnea   . History of chicken pox   . Hypercholesterolemia   . Hypertension   . Mitral regurgitation    a. echo 2014: EF 40-45%, mildly dilated RV, mod reduced RV systolic fxn, mod dilated LA, Mild to mod MR, mod TR, mildly elevated PASP  . PAF (paroxysmal atrial fibrillation) ([place])    a. initial episode 2011; b. not on long term anticoagulation since 06/2013  . RBBB (right bundle branch block)   . Seasonal allergies   . Sleep apnea    a. on CPAP     Assessment: 86 year old female admitted with atrial  fibrillation. Cardiology consulted. Pharmacy consult for heparin.  Goal of Therapy:  Heparin level 0.3-0.7 units/ml Monitor platelets by anticoagulation protocol: Yes   Plan:  09/08 @ 0300 HL 0.61 therapeutic. Will continue current rate and will recheck HL at 1100 and continue to monitor.  Tobie Lords, PharmD 04/24/2020,5:12 AM

## 2020-04-24 NOTE — ED Notes (Signed)
Son at bedside at this time.

## 2020-04-24 NOTE — Progress Notes (Signed)
[place] at [place] NAME: Jill Snow    MR#:  [id number]  DATE OF BIRTH:  03-23-1934  SUBJECTIVE:  patient quite confused. Restless. Pulling monitor wires and IV line. Answered few questions appropriately. Daughter in the room. Patient has been agitated intermittently not slept last night. Wants to go home.  Denies any chest pain or palpitation. Currently on IV amiodarone drip  REVIEW OF SYSTEMS:   Review of Systems  Unable to perform ROS: Medical condition   confused intermittently with agitation. Cannot focus Tolerating Diet: yes Tolerating PT: pending. Patient normally ambulates by herself  DRUG ALLERGIES:   Allergies  Allergen Reactions  . Augmentin [Amoxicillin-Pot Clavulanate] Nausea And Vomiting  . Codeine Anaphylaxis    "cant remember reaction"  . Crestor [Rosuvastatin] Other (See Comments)    Muscle pain/cramps     VITALS:  Blood pressure (!) 125/98, pulse (!) 122, temperature 98.9 F (37.2 C), temperature source Oral, resp. rate 15, SpO2 98 %.  PHYSICAL EXAMINATION:   Physical Exam  GENERAL:  86 y.o.-year-old patient lying in the bed with no acute distress. Confused EYES: Pupils equal, round, reactive to light and accommodation. No scleral icterus.   HEENT: Head atraumatic, normocephalic. Oropharynx and nasopharynx clear.  NECK:  Supple, no jugular venous distention. No thyroid enlargement, no tenderness.  LUNGS: Normal breath sounds bilaterally, no wheezing, rales, rhonchi. No use of accessory muscles of respiration.   CARDIOVASCULAR: S1, S2 normal. No murmurs, rubs, or gallops. Irregularly irregular rhythm. Tachycardia ABDOMEN: Soft, nontender, nondistended. Bowel sounds present. No organomegaly or mass.  EXTREMITIES: No cyanosis, clubbing or edema b/l.    NEUROLOGIC: Cranial nerves II through XII are intact. No focal Motor or sensory deficits b/l.   PSYCHIATRIC:  patient is alert and oriented x 3.  SKIN:  No obvious rash, lesion, or ulcer.   LABORATORY PANEL:  CBC Recent Labs  Lab 04/24/20 0245  WBC 8.0  HGB 12.9  HCT 37.2  PLT 156    Chemistries  Recent Labs  Lab 04/24/20 0245  NA 136  K 2.9*  CL 105  CO2 25  GLUCOSE 109*  BUN 18  CREATININE 0.90  CALCIUM 8.2*  MG 1.5*  AST 29  ALT 18  ALKPHOS 47  BILITOT 2.0*   Cardiac Enzymes No results for input(s): TROPONINI in the last 168 hours. RADIOLOGY:  DG Chest Port 1 View  Result Date: 04/24/2020 CLINICAL DATA:  Atrial fibrillation. Hypertension. Hyperlipidemia. Cardiomegaly EXAM: PORTABLE CHEST 1 VIEW COMPARISON:  04/23/2020 FINDINGS: Cardiac enlargement. Improved vascular congestion. Infiltration or atelectasis in the left base. No edema or effusion. Mediastinal contours appear intact. Calcification of the aorta. IMPRESSION: Cardiac enlargement with improving vascular congestion. Infiltration or atelectasis in the left base. Electronically Signed   By: Lucienne Capers M.D.   On: 04/24/2020 04:11   DG Chest Port 1 View  Result Date: 04/23/2020 CLINICAL DATA:  Shortness of breath, woke up feeling funny, history UTI, atrial fibrillation, hypertension EXAM: PORTABLE CHEST 1 VIEW COMPARISON:  Portable exam 1424 hours compared to 01/30/2020 FINDINGS: Enlargement of cardiac silhouette with slight vascular congestion. Mediastinal contours normal. Atherosclerotic calcification aorta. Peribronchial thickening and accentuated interstitial markings in the perihilar regions question mild pulmonary edema. Subsegmental atelectasis at LEFT base. Remaining lungs clear. No gross pleural effusion or pneumothorax. Multilevel endplate spur formation thoracic spine and osseous demineralization noted. IMPRESSION: Enlargement of cardiac silhouette with pulmonary vascular congestion and question minimal perihilar edema. Subsegmental atelectasis LEFT base. Electronically  Signed   By: Lavonia Dana M.D.   On: 04/23/2020 14:33   ASSESSMENT AND PLAN:   Jill Snow is a 86 y.o. female with a hx of paroxysmal atrial fibrillation/flutter, hypertension, hyperlipidemia, and cardiomyopathy with moderately reduced LVEF by echo 2014, short-term memory loss with possible cognitive decline comes to the emergency room with increasing confusion generalized malaise and not her usual self.  A fib with RVR-paroxysmal -admit to PCU -IV amiodarone drip, IV heparin drip -appreciate [facility] cardiology input -TSH within normal limit  Acute heart failure appears diastolic with echo of 0086 EF of 60 to 65% with mild elevated RA pressures -IV Lasix 20 mg BID-monitor input output -change to oral when appropriate -pending echo  Elevated troponin suspect demand ischemia -no acute EKG changes -continue aspirin 81 mg daily -patient denies chest pain  Agitation, restlessness, confusion with memory loss/anxiety -per daughter patient supposed to see neurology as outpatient -PRN Ativan  Hyperlipidemia  -continue atorvastatin     Procedures: none Family communication : daughter in the ER Consults : cardiology CODE STATUS: full  DVT Prophylaxis : heparin drip  Status is: Inpatient  Remains inpatient appropriate because:Inpatient level of care appropriate due to severity of illness   Dispo: The patient is from: Home              Anticipated d/c is to: TBD              Anticipated d/c date is: 2 days              Patient currently is not medically stable to d/c. patient being admitted with a fib and diastolic heart failure currently on IV heparin drip along with IV amiodarone drip. Heart rate intermittently gets elevated. Needs IV Lasix for diuresis.       TOTAL TIME TAKING CARE OF THIS PATIENT: 25 minutes.  >50% time spent on counselling and coordination of care  Note: This dictation was prepared with Dragon dictation along with smaller phrase technology. Any transcriptional errors that result from this process are unintentional.  Fritzi Mandes M.D    [facility]   CC: Primary care physician; Leone Haven, MDPatient ID: Jill Snow, female   DOB: Apr 22, 1934, 86 y.o.   MRN: [id number]

## 2020-04-24 NOTE — Progress Notes (Addendum)
Progress Note  Patient Name: Jill Snow Date of Encounter: 04/24/2020  Primary Cardiologist: Ida Rogue, MD   Subjective   States today that she wishes to go home and does not want to stay for any further treatment.  She is accompanied by her daughter this morning, who states that the pt had a rough night of no sleep, which has agitated her underlying dementia.  She added that she suspects the pt may need something to help manage her anxiety with recommendation that this be discussed with / deferred to internal medicine.  She denies chest pain, racing heart rate, palpitations.  BP low this AM with patient denying any presyncope or dizziness.  She denies racing heart rate though still on nasal cannula oxygen this morning.  Inpatient Medications    Scheduled Meds: . aspirin EC  81 mg Oral Daily  . sodium chloride flush  3 mL Intravenous Q12H   Continuous Infusions: . sodium chloride    . amiodarone    . famotidine (PEPCID) IV Stopped (04/24/20 0050)  . heparin 1,050 Units/hr (04/23/20 1924)   PRN Meds: sodium chloride, acetaminophen, docusate sodium, ondansetron (ZOFRAN) IV, polyethylene glycol, sodium chloride flush   Vital Signs    Vitals:   04/24/20 0230 04/24/20 0250 04/24/20 0300 04/24/20 0715  BP: (!) 97/55  94/68 (!) 117/57  Pulse: (!) 113  (!) 101 81  Resp: (!) 26 (!) 22 (!) 27 16  Temp:      TempSrc:      SpO2: 92% 98% 98% 95%    Intake/Output Summary (Last 24 hours) at 04/24/2020 0823 Last data filed at 04/24/2020 0721 Gross per 24 hour  Intake 1050 ml  Output --  Net 1050 ml   Last 3 Weights 04/23/2020 04/12/2020 03/04/2020  Weight (lbs) (No Data) 178 lb 175 lb 6 oz  Weight (kg) (No Data) 80.74 kg 79.55 kg      Telemetry    Atrial fibrillation with RVR and rates into the low 100s to 160s-170s. Currently ventricular rates in the low 100s-120s- Personally Reviewed  ECG    No new tracings- Personally Reviewed  Physical Exam   GEN: No acute  distress.  Accompanied by her daughter. Neck: No JVD Cardiac:  IRIR, no murmurs, rubs, or gallops.  Respiratory: Clear to auscultation bilaterally, slightly reduced bibasilar breath sounds. GI: Soft, nontender, non-distended  MS:  mild to moderate bilateral edema; No deformity. Neuro:  Nonfocal, at times confused Psych: Normal affect, slightly agitated, at times confused  Labs    High Sensitivity Troponin:   Recent Labs  Lab 04/23/20 1416 04/23/20 1922 04/24/20 0024  TROPONINIHS 532* 478* 505*      Chemistry Recent Labs  Lab 04/23/20 1321 04/23/20 1727 04/24/20 0245  NA 136  --  136  K 3.5  --  2.9*  CL 104  --  105  CO2 21*  --  25  GLUCOSE 113*  --  109*  BUN 22  --  18  CREATININE 0.95 1.05* 0.90  CALCIUM 9.3  --  8.2*  GFRNONAA 55* 49* 59*  GFRAA >60 56* >60  ANIONGAP 11  --  6     Hematology Recent Labs  Lab 04/23/20 1321 04/23/20 1727 04/24/20 0245  WBC 8.5 7.9 8.0  RBC 4.71 4.24 3.93  HGB 15.2* 13.8 12.9  HCT 43.9 39.4 37.2  MCV 93.2 92.9 94.7  MCH 32.3 32.5 32.8  MCHC 34.6 35.0 34.7  RDW 12.8 12.9 12.8  PLT 269  237 156    BNP Recent Labs  Lab 04/23/20 1322  BNP 1,599.1*     DDimer No results for input(s): DDIMER in the last 168 hours.   Radiology    DG Chest Port 1 View  Result Date: 04/24/2020 CLINICAL DATA:  Atrial fibrillation. Hypertension. Hyperlipidemia. Cardiomegaly EXAM: PORTABLE CHEST 1 VIEW COMPARISON:  04/23/2020 FINDINGS: Cardiac enlargement. Improved vascular congestion. Infiltration or atelectasis in the left base. No edema or effusion. Mediastinal contours appear intact. Calcification of the aorta. IMPRESSION: Cardiac enlargement with improving vascular congestion. Infiltration or atelectasis in the left base. Electronically Signed   By: Lucienne Capers M.D.   On: 04/24/2020 04:11   DG Chest Port 1 View  Result Date: 04/23/2020 CLINICAL DATA:  Shortness of breath, woke up feeling funny, history UTI, atrial fibrillation,  hypertension EXAM: PORTABLE CHEST 1 VIEW COMPARISON:  Portable exam 1424 hours compared to 01/30/2020 FINDINGS: Enlargement of cardiac silhouette with slight vascular congestion. Mediastinal contours normal. Atherosclerotic calcification aorta. Peribronchial thickening and accentuated interstitial markings in the perihilar regions question mild pulmonary edema. Subsegmental atelectasis at LEFT base. Remaining lungs clear. No gross pleural effusion or pneumothorax. Multilevel endplate spur formation thoracic spine and osseous demineralization noted. IMPRESSION: Enlargement of cardiac silhouette with pulmonary vascular congestion and question minimal perihilar edema. Subsegmental atelectasis LEFT base. Electronically Signed   By: Lavonia Dana M.D.   On: 04/23/2020 14:33    Cardiac Studies   Pending echo formal reading   Cardiac monitoring 12/26/2019 Event monitor Normal sinus rhythm avg HR of 74 bpm.  Bundle Branch Block/IVCD was present.  3261 Supraventricular Tachycardia runs occurred, the run with the fastest interval lasting 5 beats with a max rate of 203 bpm, the longest lasting 1 min 17 secs with an avg rate of 103 bpm. Isolated SVEs were occasional (1.2%, 8306), SVE Couplets were rare (<1.0%, 1714), and SVE Triplets were rare (<1.0%, 397).  Isolated VEs were rare (<1.0%, 3583), VE Couplets were rare (<1.0%, 40), and VE Triplets were rare (<1.0%, 4). Ventricular Bigeminy and Trigeminy were present. No significant patient triggered events.  Echo 04/01/2017 - Left ventricle: The cavity size was normal. Systolic function was  normal. The estimated ejection fraction was in the range of 60%  to 65%. Wall motion was normal; there were no regional wall  motion abnormalities. Left ventricular diastolic function  parameters were normal.  - Left atrium: The atrium was mildly dilated.  - Right ventricle: Systolic function was normal.  - Pulmonary arteries: Systolic pressure was mildly  elevated. PA  peak pressure: 35 mm Hg (S).    Patient Profile     86 y.o. female with history of PAF (02/2010, 12/2019) not on anticoagulation, atrial flutter s/p cardioversion in 2014, hypertension, hyperlipidemia, OSA on CPAP, and who is being evaluated today for atrial fibrillation with RVR.  Assessment & Plan    Paroxysmal atrial fibrillation --Denies symptoms today with consideration of her underlying dementia.   --Rates have improved this morning with ventricular rates low 100s to 120s.   --BP remains soft though improved since holding her metoprolol.   --IV Digoxin 0.125mg  was started this AM to assist with rate control. Agree. Continue for now and wean if able and once more room in BP and able to transition back to BB. Current renal function stable. --Discontinue IV amiodarone as not therapeutically anticoagulated and thus IV amiodarone / chemical cardioversion still carries risk of thromboembolic event. Will check LFTs, given they have not been checked since 2019. Wean  as tolerated. --Continue IV heparin.  Not previously on oral PTA anticoagulation on review of EMR.  --CHA2DS2VASc score of at least 6 (CHF, HTN, agex2, vascular, female) with recommendation for [facility] at discharge given risk of thromboembolic event and with careful  consideration of overall risks versus benefits given her underlying dementia and once determine her current living status and care situation.  --Daily CBC.  --K 2.9.  Recommend repletion with goal 4.0 given association of low K with arrhythmia.  Check Mg. --TSH wnl at 1.347.   --No plan for cardioversion at this time given she is not therapeutically anticoagulated.  Could consider TEE/DCCV if able to also initiate anticoagulation and ensure compliance. If she remains in atrial fibrillation at RTC, and if able to start on and consistently take [facility], consider DCCV once therapeutically anticoagulated.   Hypokalemia --Replete with goal 4.0. Check Mg with goal  2.0.  Acute on chronic HFpEF  --Denies shortness of breath with consideration of her underlying dementia. --Previous echo as above.  Pending official read of updated echocardiogram. --Remains on nasal cannula oxygen. --Continue to hold IV fluids. --Soft blood pressure today.   --Once BP allows, consider gentle diuresis. Not on BB due to hypotension.  --Daily BMET to monitor renal function electrolytes.  Elevated HS Tn --HS Tn 532  478  505.  HS Tn elevated and flat trending, which is more consistent with supply demand ischemia; however, given risk factors for cardiac etiology including CAC on CT, echo has been obtained and pending official read to evaluate EF and rule out WMA or acute structural changes. --Continue IV heparin for now given atrial fibrillation as above and as official read of echocardiogram still pending. If EF reduced on echocardiogram, consider further ischemic evaluation.  --Continue ASA 81 mg daily.  Not on a beta-blocker due to soft BP.  Continue statin for aggressive risk factor modification. Agree with recheck of LDL this AM.   Hyperlipidemia --As above, recheck LDL this AM.  Continue PTA atorvastatin 40 mg daily.  Dementia --Management per IM. Consider with start of [place] as above.   For questions or updates, please contact [place] Please consult [email or web address] for contact info under        Signed, Arvil Chaco, PA-C  04/24/2020, 8:23 AM

## 2020-04-24 NOTE — ED Notes (Addendum)
Late entry- Assumed care of pt at 1900 04/23/20, pt alert and oriented to self, place, and intermittently time, disoriented to situation. Afib 90s at 1900 and increased to 130-160s, provider made aware. Pt medicated per Mar with digoxin and amiodarone to address afib RVR. Pt responding to amiodarone infusion with HR decreasing to 90s-110s. Denies cp or sob. States slight cramping to LLE. +Pulses, regular and unlabored breathing. Daughter at bedside. Report given to recieving RN at 0300.

## 2020-04-24 NOTE — Progress Notes (Signed)
Pt resting in bed in no acute distress.  Remains in atrial fibrillation, however rate better controlled heart rate 90's to 100 bpm with amiodarone gtt infusing at 60 mg/hr and sbp 90's with map >65.  Will continue to monitor and assess pt.  Marda Stalker, [place] Pager [phone] (please enter 7 digits) PCCM Consult Pager [phone] (please enter 7 digits)

## 2020-04-24 NOTE — ED Notes (Signed)
Pt family reports pt mental status worsening over past 4 days, with reported delusions and hallucinations.

## 2020-04-24 NOTE — Progress Notes (Signed)
Cross Cover Note Patient with acute delirium, worsening throughout day per EMR notes. No improvement with lorazepam.  Measured QT 0.41 per RN measured by central tele. Dose of haldol order. Continue to monitor on tele. May need sitter for frequent reorientation and redirecting

## 2020-04-24 NOTE — ED Notes (Addendum)
Pt becoming very agitated at this time, stating that she wants to go home and that she does not need to be here. Daughter went to use the restroom and when she came back pt was in the chair instead of the bed and one of her IVs had been pulled. IV replaced and pt placed in a hospital bed for more comfort. Daughter requesting for anxiety at this time.

## 2020-04-25 ENCOUNTER — Encounter: Payer: Self-pay | Admitting: Internal Medicine

## 2020-04-25 DIAGNOSIS — J810 Acute pulmonary edema: Secondary | ICD-10-CM

## 2020-04-25 DIAGNOSIS — R410 Disorientation, unspecified: Secondary | ICD-10-CM

## 2020-04-25 DIAGNOSIS — F0391 Unspecified dementia with behavioral disturbance: Secondary | ICD-10-CM

## 2020-04-25 LAB — COMPREHENSIVE METABOLIC PANEL
ALT: 20 U/L (ref 0–44)
AST: 28 U/L (ref 15–41)
Albumin: 3.7 g/dL (ref 3.5–5.0)
Alkaline Phosphatase: 55 U/L (ref 38–126)
Anion gap: 13 (ref 5–15)
BUN: 14 mg/dL (ref 8–23)
CO2: 21 mmol/L — ABNORMAL LOW (ref 22–32)
Calcium: 9.2 mg/dL (ref 8.9–10.3)
Chloride: 105 mmol/L (ref 98–111)
Creatinine, Ser: 0.97 mg/dL (ref 0.44–1.00)
GFR calc Af Amer: >60 mL/min (ref >60)
GFR calc non Af Amer: 54 mL/min — ABNORMAL LOW (ref >60)
Glucose, Bld: 105 mg/dL — ABNORMAL HIGH (ref 70–99)
Potassium: 3.8 mmol/L (ref 3.5–5.1)
Sodium: 139 mmol/L (ref 135–145)
Total Bilirubin: 2.1 mg/dL — ABNORMAL HIGH (ref 0.3–1.2)
Total Protein: 6.7 g/dL (ref 6.5–8.1)

## 2020-04-25 LAB — ECHOCARDIOGRAM COMPLETE: S' Lateral: 2.53 cm

## 2020-04-25 LAB — CBC
HCT: 41.7 % (ref 36.0–46.0)
Hemoglobin: 14.5 g/dL (ref 12.0–15.0)
MCH: 32.4 pg (ref 26.0–34.0)
MCHC: 34.8 g/dL (ref 30.0–36.0)
MCV: 93.3 fL (ref 80.0–100.0)
Platelets: 204 10*3/uL (ref 150–400)
RBC: 4.47 MIL/uL (ref 3.87–5.11)
RDW: 13.1 % (ref 11.5–15.5)
WBC: 6.9 10*3/uL (ref 4.0–10.5)
nRBC: 0 % (ref 0.0–0.2)

## 2020-04-25 LAB — URINE CULTURE: Culture: NO GROWTH

## 2020-04-25 LAB — MAGNESIUM: Magnesium: 2.3 mg/dL (ref 1.7–2.4)

## 2020-04-25 MED ORDER — FAMOTIDINE 20 MG PO TABS
20.0000 mg | ORAL_TABLET | Freq: Every day | ORAL | Status: DC
  Administered 2020-04-25: 20 mg via ORAL
  Filled 2020-04-25: qty 1

## 2020-04-25 MED ORDER — POTASSIUM CHLORIDE CRYS ER 20 MEQ PO TBCR: 40.0000 meq | EXTENDED_RELEASE_TABLET | Freq: Once | ORAL | Status: DC

## 2020-04-25 MED ORDER — AMIODARONE HCL 400 MG PO TABS
400.0000 mg | ORAL_TABLET | Freq: Two times a day (BID) | ORAL | 0 refills | Status: DC
Start: 2020-04-25 — End: 2020-05-06

## 2020-04-25 MED ORDER — VITAMIN D 25 MCG (1000 UNIT) PO TABS
2000.0000 [IU] | ORAL_TABLET | Freq: Every day | ORAL | Status: DC
  Administered 2020-04-25: 2000 [IU] via ORAL
  Filled 2020-04-25: qty 2

## 2020-04-25 MED ORDER — VITAMIN E 180 MG (400 UNIT) PO CAPS
400.0000 [IU] | ORAL_CAPSULE | Freq: Every day | ORAL | Status: DC
  Administered 2020-04-25: 400 [IU] via ORAL
  Filled 2020-04-25: qty 1

## 2020-04-25 MED ORDER — HALOPERIDOL LACTATE 5 MG/ML IJ SOLN
2.5000 mg | Freq: Once | INTRAMUSCULAR | Status: AC
  Administered 2020-04-25: 2.5 mg via INTRAVENOUS

## 2020-04-25 MED ORDER — FUROSEMIDE 20 MG PO TABS: 20.0000 mg | ORAL_TABLET | Freq: Every day | ORAL | 0 refills | Status: DC

## 2020-04-25 MED ORDER — HALOPERIDOL LACTATE 5 MG/ML IJ SOLN
INTRAMUSCULAR | Status: AC
  Filled 2020-04-25: qty 1

## 2020-04-25 MED ORDER — ATORVASTATIN CALCIUM 20 MG PO TABS: 40.0000 mg | ORAL_TABLET | Freq: Every day | ORAL | Status: DC

## 2020-04-25 MED ORDER — AMIODARONE HCL 200 MG PO TABS
400.0000 mg | ORAL_TABLET | Freq: Two times a day (BID) | ORAL | Status: DC
  Administered 2020-04-25: 400 mg via ORAL
  Filled 2020-04-25: qty 2

## 2020-04-25 MED ORDER — MONTELUKAST SODIUM 10 MG PO TABS: 10.0000 mg | ORAL_TABLET | Freq: Every day | ORAL | Status: DC

## 2020-04-25 MED ORDER — APIXABAN 5 MG PO TABS: 5.0000 mg | ORAL_TABLET | Freq: Two times a day (BID) | ORAL | 2 refills | Status: DC

## 2020-04-25 NOTE — Telephone Encounter (Signed)
I called and spoke with the pateint and gave her the number to call [facility] [phone]  to schedule a consult with the patient.  Ethie Curless,cma

## 2020-04-25 NOTE — Plan of Care (Signed)

## 2020-04-25 NOTE — Discharge Summary (Signed)
[place] [facility] NAME: Jill Snow    MR#:  [id number]  DATE OF BIRTH:  [birth year]  DATE OF ADMISSION:  04/23/2020 ADMITTING PHYSICIAN: Fritzi Mandes, MD  DATE OF DISCHARGE: 04/25/2020  PRIMARY CARE PHYSICIAN: Leone Haven, MD    ADMISSION DIAGNOSIS:  Acute pulmonary edema (HCC) [J81.0] A-fib ([place]) [I48.91] Elevated troponin [R77.8] Atrial fibrillation with RVR (HCC) [I48.91] Acute on chronic congestive heart failure, unspecified heart failure type ([place]) [I50.9]  DISCHARGE DIAGNOSIS:  Afib with RVR--now in SR acute pulmonary edema/acute diastolic heart failure SECONDARY DIAGNOSIS:   Past Medical History:  Diagnosis Date  . Atrial fibrillation ([place])   . Atrial flutter ([place])    a. s/p successful TEE/DCCV in 2014; b. TEE 2014 showed EF 45-50%, mild bi-atrial enlargement, mod MR  . Dyspnea   . History of chicken pox   . Hypercholesterolemia   . Hypertension   . Mitral regurgitation    a. echo 2014: EF 40-45%, mildly dilated RV, mod reduced RV systolic fxn, mod dilated LA, Mild to mod MR, mod TR, mildly elevated PASP  . PAF (paroxysmal atrial fibrillation) ([place])    a. initial episode 2011; b. not on long term anticoagulation since 06/2013  . RBBB (right bundle branch block)   . Seasonal allergies   . Sleep apnea    a. on CPAP    HOSPITAL COURSE:   Jill Snow is a 86 y.o. female with a hx of paroxysmal atrial fibrillation/flutter, hypertension, hyperlipidemia, and cardiomyopathy with moderately reduced LVEF by echo 2014, short-term memory loss with possible cognitive decline comes to the emergency room with increasing confusion generalized malaise and not her usual self.  A fib with RVR-paroxysmal -IV amiodarone drip, IV heparin drip--now on po amiodarone and Eliquis. -appreciate [facility] cardiology input--per Dr Rockey Situ -TSH within normal limit  Acute heart failure appears diastolic with echo of 4656 EF of 60 to  65% with mild elevated RA pressures -IV Lasix 20 mg BID-monitor input output--change to po lasix -echo done  Elevated troponin suspect demand ischemia -no acute EKG changes -continue aspirin 81 mg daily -patient denies chest pain  Agitation, restlessness, confusion with memory loss/anxiety with possible early dementia -per daughter patient supposed to see neurology as outpatient -PRN Ativan  Hyperlipidemia  -continue atorvastatin  Spoke with patient's daughter Jill Snow in the room. Will discharge patient to home with outpatient follow-up cardiology and neurology.  CONSULTS OBTAINED:  Treatment Team:  Nelva Bush, MD  DRUG ALLERGIES:   Allergies  Allergen Reactions  . Augmentin [Amoxicillin-Pot Clavulanate] Nausea And Vomiting  . Codeine Anaphylaxis    "cant remember reaction"  . Crestor [Rosuvastatin] Other (See Comments)    Muscle pain/cramps     DISCHARGE MEDICATIONS:   Allergies as of 04/25/2020      Reactions   Augmentin [amoxicillin-pot Clavulanate] Nausea And Vomiting   Codeine Anaphylaxis   "cant remember reaction"   Crestor [rosuvastatin] Other (See Comments)   Muscle pain/cramps      Medication List    STOP taking these medications   metoprolol succinate 25 MG 24 hr tablet Commonly known as: Toprol XL     TAKE these medications   amiodarone 400 MG tablet Commonly known as: PACERONE Take 1 tablet (400 mg total) by mouth 2 (two) times daily.   apixaban 5 MG Tabs tablet Commonly known as: ELIQUIS Take 1 tablet (5 mg total) by mouth 2 (two) times daily.   aspirin EC 81 MG tablet  Take 1 tablet (81 mg total) by mouth daily. Swallow whole.   atorvastatin 40 MG tablet Commonly known as: LIPITOR TAKE 1 TABLET EVERY DAY   [employer] D3 50 MCG (2000 UT) Caps Generic drug: Cholecalciferol TAKE 1 CAPSULE EVERY DAY What changed: how much to take   fluticasone 50 MCG/ACT nasal spray Commonly known as: FLONASE USE 2 SPRAYS IN EACH NOSTRIL EVERY DAY    furosemide 20 MG tablet Commonly known as: Lasix Take 1 tablet (20 mg total) by mouth daily.   IBU 400 MG tablet Generic drug: ibuprofen TAKE ONE TABLET BY MOUTH EVERY 8 HOURS AS NEEDED FOR MILD PAIN   montelukast 10 MG tablet Commonly known as: SINGULAIR TAKE 1 TABLET EVERY DAY What changed: when to take this   vitamin E 180 MG (400 UNITS) capsule Take 400 Units by mouth daily.       If you experience worsening of your admission symptoms, develop shortness of breath, life threatening emergency, suicidal or homicidal thoughts you must seek medical attention immediately by calling 911 or calling your MD immediately  if symptoms less severe.  You Must read complete instructions/literature along with all the possible adverse reactions/side effects for all the Medicines you take and that have been prescribed to you. Take any new Medicines after you have completely understood and accept all the possible adverse reactions/side effects.   Please note  You were cared for by a hospitalist during your hospital stay. If you have any questions about your discharge medications or the care you received while you were in the hospital after you are discharged, you can call the unit and asked to speak with the hospitalist on call if the hospitalist that took care of you is not available. Once you are discharged, your primary care physician will handle any further medical issues. Please note that NO REFILLS for any discharge medications will be authorized once you are discharged, as it is imperative that you return to your primary care physician (or establish a relationship with a primary care physician if you do not have one) for your aftercare needs so that they can reassess your need for medications and monitor your lab values.   DATA REVIEW:   CBC  Recent Labs  Lab 04/25/20 1143  WBC 6.9  HGB 14.5  HCT 41.7  PLT 204    Chemistries  Recent Labs  Lab 04/25/20 0625  NA 139  K 3.8  CL  105  CO2 21*  GLUCOSE 105*  BUN 14  CREATININE 0.97  CALCIUM 9.2  MG 2.3  AST 28  ALT 20  ALKPHOS 55  BILITOT 2.1*    Microbiology Results   Recent Results (from the past 240 hour(s))  Urine Culture     Status: None   Collection Time: 04/23/20  2:35 PM   Specimen: Urine, Random  Result Value Ref Range Status   Specimen Description   Final    URINE, RANDOM Performed at [facility], [address] [place], [place] [address]    Special Requests   Final    NONE Performed at [facility], [address] [place], [place] [address]    Culture   Final    NO GROWTH Performed at [facility], [place] [address] [place],  [address]    Report Status 04/25/2020 FINAL  Final  SARS Coronavirus 2 by RT PCR (hospital order, performed in [facility] hospital lab) Nasopharyngeal Nasopharyngeal Swab     Status: None  Collection Time: 04/23/20  2:41 PM   Specimen: Nasopharyngeal Swab  Result Value Ref Range Status   SARS Coronavirus 2 NEGATIVE NEGATIVE Final    Comment: (NOTE) SARS-CoV-2 target nucleic acids are NOT DETECTED.  The SARS-CoV-2 RNA is generally detectable in upper and lower respiratory specimens during the acute phase of infection. The lowest concentration of SARS-CoV-2 viral copies this assay can detect is 250 copies / mL. A negative result does not preclude SARS-CoV-2 infection and should not be used as the sole basis for treatment or other patient management decisions.  A negative result may occur with improper specimen collection / handling, submission of specimen other than nasopharyngeal swab, presence of viral mutation(s) within the areas targeted by this assay, and inadequate number of viral copies (<250 copies / mL). A negative result must be combined with clinical observations, patient history, and epidemiological information.  Fact Sheet for Patients:   StrictlyIdeas.no  Fact Sheet for Healthcare  Providers: BankingDealers.co.za  This test is not yet approved or  cleared by the [place] FDA and has been authorized for detection and/or diagnosis of SARS-CoV-2 by FDA under an Emergency Use Authorization (EUA).  This EUA will remain in effect (meaning this test can be used) for the duration of the COVID-19 declaration under Section 564(b)(1) of the Act, 21 U.S.C. section 360bbb-3(b)(1), unless the authorization is terminated or revoked sooner.  Performed at [facility], [address] [place], [place] [address]     RADIOLOGY:  [facility] Chest Port 1 View  Result Date: 04/24/2020 CLINICAL DATA:  Atrial fibrillation. Hypertension. Hyperlipidemia. Cardiomegaly EXAM: PORTABLE CHEST 1 VIEW COMPARISON:  04/23/2020 FINDINGS: Cardiac enlargement. Improved vascular congestion. Infiltration or atelectasis in the left base. No edema or effusion. Mediastinal contours appear intact. Calcification of the aorta. IMPRESSION: Cardiac enlargement with improving vascular congestion. Infiltration or atelectasis in the left base. Electronically Signed   By: Lucienne Capers M.D.   On: 04/24/2020 04:11     CODE STATUS:     Code Status Orders  (From admission, onward)         Start     Ordered   04/23/20 1547  Full code  Continuous        04/23/20 1548        Code Status History    Date Active Date Inactive Code Status Order ID Comments User Context   11/30/2016 2340 12/03/2016 2213 Full Code [id number]  Geradine Girt, DO Inpatient   Advance Care Planning Activity       TOTAL TIME TAKING CARE OF THIS PATIENT: *35* minutes.    Fritzi Mandes M.D  [facility]    CC: Primary care physician; Leone Haven, MD

## 2020-04-25 NOTE — Progress Notes (Signed)
Progress Note  Patient Name: Jill Snow Date of Encounter: 04/25/2020  Primary Cardiologist: Ida Rogue, MD   Subjective   No CP, racing HR, palpitations, or SOB reported per pt.  Asleep on and off during today's exam. Apnea noted. Per daughter (present on exam), patient used to wear a CPAP but she does not think that this CPAP has been worn lately. Pt lives with her son. Daughter agreeable to speak with pt's son regarding enforcing CPAP if possible.   Converted to NSR 23:41 on 04/24/20 on IV amiodarone.  Currently NSR, rates well controlled.  Inpatient Medications    Scheduled Meds: . apixaban  5 mg Oral BID  . aspirin EC  81 mg Oral Daily  . atorvastatin  80 mg Oral Daily  . famotidine  20 mg Oral Daily  . furosemide  20 mg Intravenous Q12H  . sodium chloride flush  3 mL Intravenous Q12H   Continuous Infusions: . sodium chloride    . amiodarone 30 mg/hr (04/25/20 0559)   PRN Meds: sodium chloride, acetaminophen, docusate sodium, polyethylene glycol, sodium chloride flush   Vital Signs    Vitals:   04/25/20 0430 04/25/20 0544 04/25/20 0607 04/25/20 0835  BP:  (!) 120/59 133/69 (!) 119/58  Pulse:  72  68  Resp:  20 20   Temp:  98.6 F (37 C)  98.2 F (36.8 C)  TempSrc:    Oral  SpO2:  94% 99% 99%  Weight: 75.2 kg     Height:        Intake/Output Summary (Last 24 hours) at 04/25/2020 1013 Last data filed at 04/25/2020 0305 Gross per 24 hour  Intake 541.76 ml  Output 600 ml  Net -58.24 ml   Last 3 Weights 04/25/2020 04/24/2020 04/23/2020  Weight (lbs) 165 lb 12.6 oz 147 lb 0.8 oz (No Data)  Weight (kg) 75.2 kg 66.7 kg (No Data)      Telemetry    Atrial fibrillation with RVR and rates into the low 100s 180s with conversion to NSR at 23:41. Currently NSR with rates 60-70s- Personally Reviewed  ECG    No new tracings- Personally Reviewed  Physical Exam   GEN: No acute distress.  Asleep on exam. Apnea episodes noted, waking pt from sleep. Neck: No  JVD Cardiac:  RRR, 1/6 systolic murmur. No rubs, or gallops.  Respiratory: Clear to auscultation bilaterally, slightly reduced bibasilar breath sounds.  GI: Soft, nontender, non-distended  MS:  no bilateral edema; No deformity. Neuro:  Nonfocal, asleep, underlying dementia Psych: Normal affect, at times asleep.  Labs    High Sensitivity Troponin:   Recent Labs  Lab 04/23/20 1416 04/23/20 1922 04/24/20 0024  TROPONINIHS 532* 478* 505*      Chemistry Recent Labs  Lab 04/23/20 1321 04/23/20 1321 04/23/20 1727 04/24/20 0245 04/24/20 2210 04/25/20 0625  NA 136  --   --  136  --  139  K 3.5   < >  --  2.9* 4.0 3.8  CL 104  --   --  105  --  105  CO2 21*  --   --  25  --  21*  GLUCOSE 113*  --   --  109*  --  105*  BUN 22  --   --  18  --  14  CREATININE 0.95   < > 1.05* 0.90  --  0.97  CALCIUM 9.3  --   --  8.2*  --  9.2  PROT  --   --   --  5.8*  --  6.7  ALBUMIN  --   --   --  3.2*  --  3.7  AST  --   --   --  29  --  28  ALT  --   --   --  18  --  20  ALKPHOS  --   --   --  47  --  55  BILITOT  --   --   --  2.0*  --  2.1*  GFRNONAA 55*   < > 49* 59*  --  54*  GFRAA >60   < > 56* >60  --  >60  ANIONGAP 11  --   --  6  --  13   < > = values in this interval not displayed.     Hematology Recent Labs  Lab 04/23/20 1321 04/23/20 1727 04/24/20 0245  WBC 8.5 7.9 8.0  RBC 4.71 4.24 3.93  HGB 15.2* 13.8 12.9  HCT 43.9 39.4 37.2  MCV 93.2 92.9 94.7  MCH 32.3 32.5 32.8  MCHC 34.6 35.0 34.7  RDW 12.8 12.9 12.8  PLT 269 237 156    BNP Recent Labs  Lab 04/23/20 1322  BNP 1,599.1*     DDimer No results for input(s): DDIMER in the last 168 hours.   Radiology    DG Chest Port 1 View  Result Date: 04/24/2020 CLINICAL DATA:  Atrial fibrillation. Hypertension. Hyperlipidemia. Cardiomegaly EXAM: PORTABLE CHEST 1 VIEW COMPARISON:  04/23/2020 FINDINGS: Cardiac enlargement. Improved vascular congestion. Infiltration or atelectasis in the left base. No edema or  effusion. Mediastinal contours appear intact. Calcification of the aorta. IMPRESSION: Cardiac enlargement with improving vascular congestion. Infiltration or atelectasis in the left base. Electronically Signed   By: Lucienne Capers M.D.   On: 04/24/2020 04:11   DG Chest Port 1 View  Result Date: 04/23/2020 CLINICAL DATA:  Shortness of breath, woke up feeling funny, history UTI, atrial fibrillation, hypertension EXAM: PORTABLE CHEST 1 VIEW COMPARISON:  Portable exam 1424 hours compared to 01/30/2020 FINDINGS: Enlargement of cardiac silhouette with slight vascular congestion. Mediastinal contours normal. Atherosclerotic calcification aorta. Peribronchial thickening and accentuated interstitial markings in the perihilar regions question mild pulmonary edema. Subsegmental atelectasis at LEFT base. Remaining lungs clear. No gross pleural effusion or pneumothorax. Multilevel endplate spur formation thoracic spine and osseous demineralization noted. IMPRESSION: Enlargement of cardiac silhouette with pulmonary vascular congestion and question minimal perihilar edema. Subsegmental atelectasis LEFT base. Electronically Signed   By: Lavonia Dana M.D.   On: 04/23/2020 14:33    Cardiac Studies   Pending echo formal reading   Cardiac monitoring 12/26/2019 Event monitor Normal sinus rhythm avg HR of 74 bpm.  Bundle Branch Block/IVCD was present.  3261 Supraventricular Tachycardia runs occurred, the run with the fastest interval lasting 5 beats with a max rate of 203 bpm, the longest lasting 1 min 17 secs with an avg rate of 103 bpm. Isolated SVEs were occasional (1.2%, 8306), SVE Couplets were rare (<1.0%, 1714), and SVE Triplets were rare (<1.0%, 397).  Isolated VEs were rare (<1.0%, 3583), VE Couplets were rare (<1.0%, 40), and VE Triplets were rare (<1.0%, 4). Ventricular Bigeminy and Trigeminy were present. No significant patient triggered events.  Echo 04/01/2017 - Left ventricle: The cavity size was  normal. Systolic function was  normal. The estimated ejection fraction was in the range of 60%  to 65%. Wall motion was normal; there were no regional wall  motion abnormalities. Left ventricular diastolic function  parameters  were normal.  - Left atrium: The atrium was mildly dilated.  - Right ventricle: Systolic function was normal.  - Pulmonary arteries: Systolic pressure was mildly elevated. PA  peak pressure: 35 mm Hg (S).    Patient Profile     86 y.o. female with history of PAF (02/2010, 12/2019) not on anticoagulation, atrial flutter s/p cardioversion in 2014, hypertension, hyperlipidemia, OSA on CPAP, and who is being evaluated today for atrial fibrillation with RVR.  Assessment & Plan    Paroxysmal atrial fibrillation with RVR --Denies symptoms with consideration of her underlying dementia.   --Converted to NSR 23:41 on 9/8 with IV amiodarone. Recommend transition to oral amiodarone before discharge with reevaluation in 2-4 weeks regarding transition to maintenance dosing - will need close office follow-up.  --BP soft on BB, improved since holding her metoprolol. SBP 119. If able to demonstrate consistent stable pressures, could restart BB. Will continue to hold for now.  --Continue Eliquis 5mg  BID. She reports a poor reaction to Xarelto in the past and thus we will monitor her closely on Eliquis. She is agreeable to this plan. CHA2DS2VASc score of at least 6 (CHF, HTN, agex2, vascular, female). Risks versus benefits of [place] discussed and considered given her underlying dementia as previously noted in clinic. Discussed again with daughter today. --Daily CBC. Will order a CBC today. --Replete electrolytes. --TSH wnl at 1.347.   --CPAP recommended- discussed in detail with daughter. --Will need close follow-up in clinic as above.   Hypokalemia --Replete with goal 4.0.   Acute on chronic HFpEF  --Denies shortness of breath with consideration of her underlying  dementia. --Previous echo as above.  Pending official read of updated echocardiogram. --Continue IV diuresis and titrate as needed until euvolemic on exam. Volume status improving though still mildly volume up on exam. Current renal function stable. Will need to transition to oral lasix before discharge. I/Os, daily weights. --Daily BMET to monitor renal function electrolytes. Current renal function stable. --Not on BB due to hypotension. Restart once consistent room in pressures if able.   OSA --CPAP recommended.  Elevated HS Tn --HS Tn 532  478  505.  HS Tn elevated and flat trending, which is more consistent with supply demand ischemia. If EF reduced on echocardiogram, consider further ischemic evaluation. At this time, no plan for further ischemic workup this admission. Continue Eliquis in place of ASA. Not on a beta-blocker due to soft BP.  Continue statin for aggressive risk factor modification.  Hyperlipidemia --LDL 175.  Continue atorvastatin 80 mg daily.  Dementia --Management per IM.  For questions or updates, please contact [place] Please consult [email or web address] for contact info under        Signed, Arvil Chaco, PA-C  04/25/2020, 10:13 AM

## 2020-04-25 NOTE — Telephone Encounter (Signed)
Patient's daughter called in stated that mother was in hospital and she lost the phone number to the referral for neurology wanted to know if you could give it to her again

## 2020-04-25 NOTE — Progress Notes (Signed)
Patient sitting up in the chair.  Patient being discharged. IV's taken out and tele monitor off. Discharge instructions given to patient's daughter. All questions answered. Patient being discharged in stable condition.

## 2020-04-25 NOTE — Progress Notes (Signed)
[place] at [place] NAME: Adanely Reynoso    MR#:  [id number]  DATE OF BIRTH:  07/22/1934  SUBJECTIVE:  Pt much calmer today. dter in the room  Sitter placed in the room due to safety. Much calm during my evaluation Denies cp and SOB  NSR on telemonitor Off amiodarone gtt  REVIEW OF SYSTEMS:   Review of Systems  Constitutional: Negative for chills, fever and weight loss.  HENT: Negative for ear discharge, ear pain and nosebleeds.   Eyes: Negative for blurred vision, pain and discharge.  Respiratory: Negative for sputum production, shortness of breath, wheezing and stridor.   Cardiovascular: Negative for chest pain, palpitations, orthopnea and PND.  Gastrointestinal: Negative for abdominal pain, diarrhea, nausea and vomiting.  Genitourinary: Negative for frequency and urgency.  Musculoskeletal: Negative for back pain and joint pain.  Neurological: Positive for weakness. Negative for sensory change, speech change and focal weakness.  Psychiatric/Behavioral: Negative for depression and hallucinations. The patient is not nervous/anxious.    Tolerating Diet: yes Tolerating PT: pending. Patient normally ambulates by herself  DRUG ALLERGIES:   Allergies  Allergen Reactions  . Augmentin [Amoxicillin-Pot Clavulanate] Nausea And Vomiting  . Codeine Anaphylaxis    "cant remember reaction"  . Crestor [Rosuvastatin] Other (See Comments)    Muscle pain/cramps     VITALS:  Blood pressure 104/62, pulse 62, temperature 98.2 F (36.8 C), temperature source Oral, resp. rate 20, height 5\' 7"  (1.702 m), weight 75.2 kg, SpO2 97 %.  PHYSICAL EXAMINATION:   Physical Exam  GENERAL:  86 y.o.-year-old patient lying in the bed with no acute distress. Confused EYES: Pupils equal, round, reactive to light and accommodation. No scleral icterus.   HEENT: Head atraumatic, normocephalic. Oropharynx and nasopharynx clear.  NECK:  Supple, no jugular  venous distention. No thyroid enlargement, no tenderness.  LUNGS: Normal breath sounds bilaterally, no wheezing, rales, rhonchi. No use of accessory muscles of respiration.   CARDIOVASCULAR: S1, S2 normal. No murmurs, rubs, or gallops.NSR on Tele ABDOMEN: Soft, nontender, nondistended. Bowel sounds present. No organomegaly or mass.  EXTREMITIES: No cyanosis, clubbing or edema b/l.    NEUROLOGIC: Cranial nerves II through XII are intact. No focal Motor or sensory deficits b/l.   PSYCHIATRIC:  patient is alert with some baseline confusion  SKIN: No obvious rash, lesion, or ulcer.   LABORATORY PANEL:  CBC Recent Labs  Lab 04/25/20 1143  WBC 6.9  HGB 14.5  HCT 41.7  PLT 204    Chemistries  Recent Labs  Lab 04/25/20 0625  NA 139  K 3.8  CL 105  CO2 21*  GLUCOSE 105*  BUN 14  CREATININE 0.97  CALCIUM 9.2  MG 2.3  AST 28  ALT 20  ALKPHOS 55  BILITOT 2.1*   Cardiac Enzymes No results for input(s): TROPONINI in the last 168 hours. RADIOLOGY:  DG Chest Port 1 View  Result Date: 04/24/2020 CLINICAL DATA:  Atrial fibrillation. Hypertension. Hyperlipidemia. Cardiomegaly EXAM: PORTABLE CHEST 1 VIEW COMPARISON:  04/23/2020 FINDINGS: Cardiac enlargement. Improved vascular congestion. Infiltration or atelectasis in the left base. No edema or effusion. Mediastinal contours appear intact. Calcification of the aorta. IMPRESSION: Cardiac enlargement with improving vascular congestion. Infiltration or atelectasis in the left base. Electronically Signed   By: Lucienne Capers M.D.   On: 04/24/2020 04:11   ASSESSMENT AND PLAN:  TACI STERLING is a 86 y.o. female with a hx of paroxysmal atrial fibrillation/flutter, hypertension, hyperlipidemia, and cardiomyopathy with  moderately reduced LVEF by echo 2014, short-term memory loss with possible cognitive decline comes to the emergency room with increasing confusion generalized malaise and not her usual self.  A fib with RVR-paroxysmal -IV  amiodarone drip, IV heparin drip--now on po amiodarone and Eliquis. -appreciate [facility] cardiology input--per Dr Rockey Situ -TSH within normal limit  Acute heart failure appears diastolic with echo of 0355 EF of 60 to 65% with mild elevated RA pressures -IV Lasix 20 mg BID-monitor input output--change to po lasix -echo done  Elevated troponin suspect demand ischemia -no acute EKG changes -continue aspirin 81 mg daily -patient denies chest pain  Agitation, restlessness, confusion with memory loss/anxiety with possible early dementia -per daughter patient supposed to see neurology as outpatient -PRN Ativan  Hyperlipidemia  -continue atorvastatin  I spoke with patient's daughter in the room. She ambulated to the chair with RN appropriately. Patient's confusion agitation gets worse than when she does not see her family member. She had said earlier. She was calm when I went in the room. Discussed with patient's daughter I'm okay with discharging her to go home and patient be in her environment since she kept repeating she wanted to go home. They will have neurology appointment scheduled as outpatient. Daughter is in agreement to take her home. Discussed with patient's RN.   Procedures: none Family communication : daughter in the room Consults : cardiology CODE STATUS: full  DVT Prophylaxis : eliquis   Dispo: The patient is from: Home              Anticipated d/c is to: home               Anticipated d/c date is today              Patient currently is medically stable       TOTAL TIME TAKING CARE OF THIS PATIENT: 25 minutes.  >50% time spent on counselling and coordination of care  Note: This dictation was prepared with Dragon dictation along with smaller phrase technology. Any transcriptional errors that result from this process are unintentional.  Fritzi Mandes M.D    [facility]   CC: Primary care physician; Leone Haven, MDPatient ID: [name], female    DOB: [birth year], 86 y.o.   MRN: [id number]

## 2020-04-26 ENCOUNTER — Telehealth: Payer: Self-pay | Admitting: Family

## 2020-04-26 ENCOUNTER — Telehealth: Payer: Self-pay | Admitting: Family Medicine

## 2020-04-26 ENCOUNTER — Encounter: Payer: Self-pay | Admitting: Neurology

## 2020-04-26 NOTE — Telephone Encounter (Signed)
TCM....  Patient is being discharged   They saw G[facility]/Visser  They are scheduled to see Laurann Montana on 9/17  They were seen for atrial fibrillation with RVR   They need to be seen within 1-2 weeks  Pt not placed on  wait list   Please call

## 2020-04-26 NOTE — Telephone Encounter (Signed)
Patient daughter, Jill Snow, contacted regarding discharge from [facility] on 9/9.  Patient understands to follow up with provider Owens Shark, NP on 9/17 at 0930 at [facility]. Patient understands discharge instructions? yes Patient understands medications and regiment? yes Patient understands to bring all medications to this visit? yes

## 2020-04-26 NOTE — Telephone Encounter (Signed)
-----   Message from Arvil Chaco, PA-C sent at 04/25/2020  4:18 PM EDT ----- Regarding: TCM Jill Snow was seen at [facility] for atrial fibrillation with RVR and started on amiodarone.    Please ensure she has follow-up in the office within 1 to 2 weeks. Thank you!

## 2020-04-26 NOTE — Telephone Encounter (Signed)
Pt's daughter Jill Snow states that pt was discharged from the hospital on 04/25/20. They wanted pt to have HFU with PCP. NO appts avail-Please call back to advise [phone]

## 2020-04-29 DIAGNOSIS — G4733 Obstructive sleep apnea (adult) (pediatric): Secondary | ICD-10-CM | POA: Diagnosis not present

## 2020-04-29 DIAGNOSIS — Z9989 Dependence on other enabling machines and devices: Secondary | ICD-10-CM | POA: Diagnosis not present

## 2020-04-29 DIAGNOSIS — I509 Heart failure, unspecified: Secondary | ICD-10-CM | POA: Diagnosis not present

## 2020-04-29 DIAGNOSIS — J9610 Chronic respiratory failure, unspecified whether with hypoxia or hypercapnia: Secondary | ICD-10-CM | POA: Diagnosis not present

## 2020-04-29 DIAGNOSIS — E261 Secondary hyperaldosteronism: Secondary | ICD-10-CM | POA: Diagnosis not present

## 2020-04-29 NOTE — Telephone Encounter (Signed)
Please offer them appointment on 05/08/2020 at 4:30 PM.  It appears that she is seeing cardiology later this week so she should be okay to wait to see me until 9/22.

## 2020-04-30 NOTE — Telephone Encounter (Signed)
LVM for the patient  to call back to see if the appointment selected is okay.  Marlean Mortell,cma

## 2020-04-30 NOTE — Telephone Encounter (Signed)
No TCM for this upcoming appointment. Patient seen by specialist.

## 2020-05-01 NOTE — Telephone Encounter (Signed)
It is fine for her to see cardiology first and then determine if she needs to see me.  She was hospitalized for a cardiac issue so she may not need an appointment with me after she sees cardiology.

## 2020-05-01 NOTE — Telephone Encounter (Signed)
Pt's daughter said she works two jobs and she is working both jobs that day. She said she will check with cardiologist and let us know if it necessary to have appointment with Dr. Caryl Bis next week or not.

## 2020-05-01 NOTE — Telephone Encounter (Signed)
See message from daughter requesting to see cariology first to see if PCP follow up is needed?

## 2020-05-03 ENCOUNTER — Ambulatory Visit: Payer: Medicare HMO | Admitting: Family

## 2020-05-06 ENCOUNTER — Other Ambulatory Visit: Payer: Self-pay

## 2020-05-06 ENCOUNTER — Encounter: Payer: Self-pay | Admitting: Family

## 2020-05-06 ENCOUNTER — Ambulatory Visit (INDEPENDENT_AMBULATORY_CARE_PROVIDER_SITE_OTHER): Payer: Medicare HMO | Admitting: Family

## 2020-05-06 VITALS — BP 112/52 | HR 56 | Ht 67.0 in | Wt 165.0 lb

## 2020-05-06 DIAGNOSIS — I4892 Unspecified atrial flutter: Secondary | ICD-10-CM

## 2020-05-06 DIAGNOSIS — I2510 Atherosclerotic heart disease of native coronary artery without angina pectoris: Secondary | ICD-10-CM | POA: Diagnosis not present

## 2020-05-06 DIAGNOSIS — E7800 Pure hypercholesterolemia, unspecified: Secondary | ICD-10-CM

## 2020-05-06 DIAGNOSIS — I10 Essential (primary) hypertension: Secondary | ICD-10-CM | POA: Diagnosis not present

## 2020-05-06 DIAGNOSIS — I480 Paroxysmal atrial fibrillation: Secondary | ICD-10-CM | POA: Diagnosis not present

## 2020-05-06 MED ORDER — FUROSEMIDE 20 MG PO TABS: ORAL_TABLET | ORAL | 1 refills | Status: DC

## 2020-05-06 MED ORDER — APIXABAN 5 MG PO TABS: 5.0000 mg | ORAL_TABLET | Freq: Two times a day (BID) | ORAL | 1 refills | Status: DC

## 2020-05-06 MED ORDER — AMIODARONE HCL 200 MG PO TABS: ORAL_TABLET | ORAL | 0 refills | Status: DC

## 2020-05-06 MED ORDER — FUROSEMIDE 20 MG PO TABS: ORAL_TABLET | ORAL | 0 refills | Status: DC

## 2020-05-06 MED ORDER — AMIODARONE HCL 200 MG PO TABS: ORAL_TABLET | ORAL | 1 refills | Status: DC

## 2020-05-06 NOTE — Progress Notes (Signed)
Office Visit    Patient Name: Jill Snow Date of Encounter: 05/06/2020  Primary Care Provider:  Leone Haven, MD Primary Cardiologist:  Ida Rogue, MD Electrophysiologist:  None   Chief Complaint    Jill Snow is a 86 y.o. female with a hx of HFrEF, PAF (July 2011, May 2021, September 2021), atrial flutter s/p cardioversion in 2014, HTN, HLD, OSA on CPAP presents today for hospital follow-up  Past Medical History    Past Medical History:  Diagnosis Date  . Atrial fibrillation ([place])   . Atrial flutter ([place])    a. s/p successful TEE/DCCV in 2014; b. TEE 2014 showed EF 45-50%, mild bi-atrial enlargement, mod MR  . Dyspnea   . History of chicken pox   . Hypercholesterolemia   . Hypertension   . Mitral regurgitation    a. echo 2014: EF 40-45%, mildly dilated RV, mod reduced RV systolic fxn, mod dilated LA, Mild to mod MR, mod TR, mildly elevated PASP  . PAF (paroxysmal atrial fibrillation) ([place])    a. initial episode 2011; b. not on long term anticoagulation since 06/2013  . RBBB (right bundle branch block)   . Seasonal allergies   . Sleep apnea    a. on CPAP   Past Surgical History:  Procedure Laterality Date  . ABDOMINAL HYSTERECTOMY    . TONSILLECTOMY AND ADENOIDECTOMY  1947  . tummy tuck      Allergies  Allergies  Allergen Reactions  . Augmentin [Amoxicillin-Pot Clavulanate] Nausea And Vomiting  . Codeine Anaphylaxis    "cant remember reaction"  . Crestor [Rosuvastatin] Other (See Comments)    Muscle pain/cramps     History of Present Illness    Jill Snow is a 86 y.o. female with a hx of PAF (July 2011, May 2021, September 2021), HFrEF, atrial flutter s/p cardioversion in 2014, HTN, HLD, OSA on CPAP. She was last seen while hospitalized  Evaluated September 2014 for atrial flutter in the setting of URI.  Minimal improvement with IV Cardizem and noted hypotension. Ultimately underwent TEE cardioversion and rate difficult  to control. TTE 05/16/2013 with LVEF 45-50%, mildly dilated LA/RA, moderate MR. She was started on anticoagulation.   In follow up 03/2018 was noted to be noncompliant with medications. Not taking anticoagulation nor amiodarone. She was recommended to monitor for recurrent arrhythmia.   She had a CT chest abdomen in 2018 showing mild aortic atherosclerosis and mild coronary artery calcification to the LAD and RCA by Dr. Donivan Scull review.   Admitted May 2021 in [place], [place] for cellulitis and infection after presenting with fever and left arm pain. A week prior she had mechanical fall, landed on left side, developed erythema and tenderness. Found to have LUE celluitis and LLL PNA with L pleural effusion. Treated with IV abx. Per documentation she had atrial fibrillation while in the ED but self converted to NSR. Unfortunately that EKG is unavailable for my review. Additional EKG reported as ST with occasional PVC and RBBB.   Clinic visit 12/26/19 noted multiple stories to how she injured her arm. Was planning to follow-up with neurology regarding cognitive changes. She was recommended for 14-day ZIO to assess for recurrent atrial fibrillation.  Subsequent ZIO showed predominantly NSR average heart rate 74 bpm, infrequent PVC/PAC, 3261 episodes of SVT with longest 10min17sec at rate of 103bpm and fastest 5 beats with max rate 203bpm.   Clinically to 01/30/2020 she was started on Xarelto.  She developed nausea, vomiting, altered mental  status and subsequently discontinued.  At follow-up 03/04/2020 with Dr. Rockey Situ she and her daughter declined anticoagulation. Metoprolol succinate 12.5 mg was added to her regimen.  Presented to [facility] on 04/23[facility]2020 with generalized malaise and increased confusion.  She went to her PCP and was found to be in atrial fibrillation with RVR.  She was sent to the ED via EMS.  She received IV metoprolol and diltiazem for rate control with subsequent hypotension.  Echo 9 7/21 EF 40-45%,  global hypokinesis, mild LVH, indeterminate diastolic parameters, mildly elevated PASP, LA moderately dilated, mild MR, moderate TR.  Converted to NSR with IV amiodarone.  She was discharged on amiodarone 400 mg twice daily and Eliquis 5 mg twice daily.  She did have demand ischemia in the setting of delirium and atrial fibrillation.  Not recommended for ischemic evaluation.  EKGs/Labs/Other Studies Reviewed:   The following studies were reviewed today:  ZIO 12/2019 Normal sinus rhythm avg HR of 74 bpm.  Bundle Branch Block/IVCD was present.    3261 Supraventricular Tachycardia runs occurred, the run with the fastest interval lasting 5 beats with a max rate of 203 bpm, the longest lasting 1 min 17 secs with an avg rate of 103 bpm.   Isolated SVEs were occasional (1.2%, 8306), SVE Couplets were rare (<1.0%, 1714), and SVE Triplets were rare (<1.0%, 397).    Isolated VEs were rare (<1.0%, 3583), VE Couplets were rare (<1.0%, 40), and VE Triplets were rare (<1.0%, 4). Ventricular Bigeminy and Trigeminy were present.   No significant patient triggered events.  Carotid duplex 03/2017 Minimal plaque, bilaterally. Stable, 1-39% bilateral ICA stenosis. Patent vertebral arteries with antegrade flow. Normal subclavian arteries, bilaterally. Abnormal thyroid appearance-there is a well-circumscribed heterogenous, vascular structure in the left lobe of the thyroid measuring 2.3 cm x 2 cm.  Echo 03/2017 Left ventricle: The cavity size was normal. Systolic function was    normal. The estimated ejection fraction was in the range of 60%    to 65%. Wall motion was normal; there were no regional wall    motion abnormalities. Left ventricular diastolic function    parameters were normal.  - Left atrium: The atrium was mildly dilated.  - Right ventricle: Systolic function was normal.  - Pulmonary arteries: Systolic pressure was mildly elevated. PA    peak pressure: 35 mm Hg (S).   EKG:  EKG is ordered  today.  The ekg ordered today demonstrates SB 59 bpm with TWI in inferolateral leads. Maintaining NSR compared to EKG in hospital 04/23/20 though TWI in inferolateral leads are stable.   Recent Labs: 04/23/2020: B Natriuretic Peptide 1,599.1 04/24/2020: TSH 1.347 04/25/2020: ALT 20; BUN 14; Creatinine, Ser 0.97; Hemoglobin 14.5; Magnesium 2.3; Platelets 204; Potassium 3.8; Sodium 139  Recent Lipid Panel    Component Value Date/Time   CHOL 224 (H) 04/24/2020 0245   CHOL 281 (H) 03/07/2014 0832   TRIG 123 04/24/2020 0245   HDL 24 (L) 04/24/2020 0245   HDL 49 03/07/2014 0832   CHOLHDL 9.3 04/24/2020 0245   VLDL 25 04/24/2020 0245   LDLCALC 175 (H) 04/24/2020 0245   LDLCALC 202 (H) 03/07/2014 0832   LDLDIRECT 224.0 06/17/2018 1020    Home Medications   Current Meds  Medication Sig  . apixaban (ELIQUIS) 5 MG TABS tablet Take 1 tablet (5 mg total) by mouth 2 (two) times daily.  Marland Kitchen atorvastatin (LIPITOR) 40 MG tablet TAKE 1 TABLET EVERY DAY (Patient taking differently: Take 40 mg by mouth daily. )  .  [employer] D3 50 MCG (2000 UT) CAPS TAKE 1 CAPSULE EVERY DAY (Patient taking differently: Take 2,000 Units by mouth daily. )  . fluticasone (FLONASE) 50 MCG/ACT nasal spray USE 2 SPRAYS IN EACH NOSTRIL EVERY DAY  . furosemide (LASIX) 20 MG tablet Take 1 tablet (20 mg) by mouth once daily. You may take an extra tablet (20 mg) by mouth once daily as needed for weight gain of 2 pounds overnight or 5 pounds in one week.  . IBU 400 MG tablet TAKE ONE TABLET BY MOUTH EVERY 8 HOURS AS NEEDED FOR MILD PAIN  . montelukast (SINGULAIR) 10 MG tablet TAKE 1 TABLET EVERY DAY (Patient taking differently: Take 10 mg by mouth at bedtime. )  . vitamin E 400 UNIT capsule Take 400 Units by mouth daily.  . [DISCONTINUED] amiodarone (PACERONE) 400 MG tablet Take 1 tablet (400 mg total) by mouth 2 (two) times daily.  . [DISCONTINUED] aspirin EC 81 MG tablet Take 1 tablet (81 mg total) by mouth daily. Swallow whole.  .  [DISCONTINUED] furosemide (LASIX) 20 MG tablet Take 1 tablet (20 mg total) by mouth daily.   Review of Systems    Review of Systems  Constitutional: Positive for malaise/fatigue. Negative for chills and fever.  Cardiovascular: Negative for chest pain, dyspnea on exertion, irregular heartbeat, leg swelling, near-syncope, orthopnea, palpitations and syncope.  Respiratory: Negative for cough, shortness of breath and wheezing.   Gastrointestinal: Negative for melena, nausea and vomiting.  Genitourinary: Negative for hematuria.  Neurological: Negative for dizziness, light-headedness and weakness.  Psychiatric/Behavioral: Positive for memory loss.   All other systems reviewed and are otherwise negative except as noted above.  Physical Exam    VS:  BP (!) 112/52 (BP Location: Right Arm, Patient Position: Sitting, Cuff Size: Normal)   Pulse (!) 56   Ht 5\' 7"  (1.702 m)   Wt 165 lb (74.8 kg)   SpO2 97%   BMI 25.84 kg/m  , BMI Body mass index is 25.84 kg/m. GEN: Well nourished, well developed, in no acute distress. HEENT: normal. Neck: Supple, no JVD, carotid bruits, or masses. Cardiac: RRR, no murmurs, rubs, or gallops. No clubbing, cyanosis, edema.  Radials/PT 2+ and equal bilaterally.  Respiratory:  Respirations regular and unlabored, clear to auscultation bilaterally. GI: Soft, nontender, nondistended. MS: No deformity or atrophy. Skin: Warm and dry, no rash. Neuro:  Strength and sensation are intact. Psych: Normal affect. Alert to person, place, time - noted cognitive, memory impairment.  Assessment & Plan    1. PAF - Diagnosis 2011 with atrial flutter requiring cardioversion 2014. Repeat occurrence at outside hospital 12/2019 and during recent admission 04/2020.EKG today shows SB 59 bpm with TWI in inferolateral leads likely due to RBBB. TWI present on ED EKG as well. Unfortunately, no EKG after conversion to NSR while hospitalized. No ischemic symptoms nor palpitations. CHADS2VASc of 4  (agex2, gender, hx HTN). Continue Eliquis 5mg  BID (previous poor reaction to Xarelto).  Discharged on amiodarone after recent hospitalization-to complete 10 g load-Amiodarone 200 mg twice daily for 5 days then Amiodarone 200 mg daily.  2. HFrEF - Echo 04/23/2020 LVEF 40-45%.  Euvolemic and well compensated on exam.  Dry weight 165lbs.  Consider tachycardia mediated in setting of admission for atrial for with RVR.  Continue Lasix 20 mg daily with an additional 20 mg as needed for weight gain of 2 pounds overnight or 5 pounds in 1 week, refill provided.  No beta-blocker no ARB due to relative hypotension which limits further  escalation of GDMT.  3. HTN - BP well controlled. Continue current antihypertensive regimen.   4. HLD - Continue Atorvastatin 40mg  daily.   5. ?Dementia - Daughter is present today to assist with history taking. Notes decline in cognitive function over last 6 months.  Upcoming appointment with neurology in December.  6. Lung nodule -CT chest 03/11/2020 stable 6 mm nodule in right upper lobe considered benign and stable 11 X 9 mm groundglass opacity in left upper lobe recommended for repeat CT in 12 months.  Continue to follow with PCP.  7. RBBB -stable finding on EKG.  Denies lightheadedness, dizziness, near syncope.  Continue to monitor with routine EKG.  8. Coronary artery calcification on CT - No anginal symptoms, no indication for ischemic evaluation. She does have TWI in anteroContinue statin. No aspirin due to anticoagulation.   Disposition: Follow up in 4 week(s) with Dr. Rockey Situ or APP. We did try to collect BMP today but were unable as she had not had anything to drink today, will discuss with PCP if they can collect at follow up.   Loel Dubonnet, NP 05/06/2020, 4:05 PM

## 2020-05-06 NOTE — Patient Instructions (Addendum)
Medication Instructions:  Your physician has recommended you make the following change in your medication:   CONTINUE Lasix 20mg  daily. You may take an additional 20mg  tablet as needed for 2 pounds overnight or 5 pounds in one week.   STOP Aspirin  CHANGE Amiodarone to 200mg  twice daily for 5 days  ON 05/12/20 CHANGE Amiodarone to 200mg  daily  Try Famotidine (Pepcid) as needed for upset stomach  *If you need a refill on your cardiac medications before your next appointment, please call your pharmacy*   Lab Work: Your physician recommends that you have lab work today: BMP  If you have labs (blood work) drawn today and your tests are completely normal, you will receive your results only by: Marland Kitchen MyChart Message (if you have MyChart) OR . A paper copy in the mail If you have any lab test that is abnormal or we need to change your treatment, we will call you to review the results.  Testing/Procedures: Your EKG today shows sinus bradycardia which is a stable finding.    Follow-Up: At [facility], you and your health needs are our priority.  As part of our continuing mission to provide you with exceptional heart care, we have created designated Provider Care Teams.  These Care Teams include your primary Cardiologist (physician) and Advanced Practice Providers (APPs -  Physician Assistants and Nurse Practitioners) who all work together to provide you with the care you need, when you need it.  We recommend signing up for the patient portal called "MyChart".  Sign up information is provided on this After Visit Summary.  MyChart is used to connect with patients for Virtual Visits (Telemedicine).  Patients are able to view lab/test results, encounter notes, upcoming appointments, etc.  Non-urgent messages can be sent to your provider as well.   To learn more about what you can do with MyChart, go to [email or web address].    Your next appointment:   4 week(s)  The format for your next  appointment:   In Person  Provider:   You may see Ida Rogue, MD or one of the following Advanced Practice Providers on your designated Care Team:    Murray Hodgkins, NP  Christell Faith, PA-C  Laurann Montana, NP  Marrianne Mood, PA-C  Cadence Kathlen Mody, [place]  Other Instructions

## 2020-05-14 ENCOUNTER — Telehealth: Payer: Self-pay | Admitting: *Deleted

## 2020-05-14 NOTE — Telephone Encounter (Signed)
Pt has been approved for Eliquis 5 mg tablet. Tier exception has been accepted covered at Tier 1 co pay due to the COVID-19 pandemic and infection risk associated with the monitoring required for the lower Tier medication.  Approval will expire 12/31/202.

## 2020-05-21 ENCOUNTER — Ambulatory Visit: Payer: Medicare PPO

## 2020-05-30 ENCOUNTER — Ambulatory Visit (INDEPENDENT_AMBULATORY_CARE_PROVIDER_SITE_OTHER): Payer: Medicare HMO

## 2020-05-30 VITALS — BP 125/57 | HR 77 | Ht 67.0 in | Wt 165.0 lb

## 2020-05-30 DIAGNOSIS — Z0000 Encounter for general adult medical examination without abnormal findings: Secondary | ICD-10-CM

## 2020-05-30 NOTE — Patient Instructions (Addendum)
Jill Snow , Thank you for taking time to come for your Medicare Wellness Visit. I appreciate your ongoing commitment to your health goals. Please review the following plan we discussed and let me know if I can assist you in the future.   These are the goals we discussed: Goals    . Follow up with Primary Care Provider     As needed       This is a list of the screening recommended for you and due dates:  Health Maintenance  Topic Date Due  . Urine Protein Check  Never done  . DEXA scan (bone density measurement)  Never done  . Flu Shot  03/17/2020  . Tetanus Vaccine  06/17/2028  . Pneumonia vaccines  Completed  . COVID-19 Vaccine  Discontinued    Immunizations Immunization History  Administered Date(s) Administered  . Hep A / Hep B 04/22/2016, 05/27/2016  . Influenza Split 05/31/2013  . Influenza, High Dose Seasonal PF 06/21/2017, 05/18/2018  . Influenza,inj,Quad PF,6+ Mos 05/15/2014, 06/14/2015, 04/22/2016  . PFIZER SARS-COV-2 Vaccination 12/11/2019  . Pneumococcal Conjugate-13 06/14/2015  . Pneumococcal Polysaccharide-23 07/18/2013  . Tdap 01/02/2014, 06/16/2018, 06/17/2018   Advanced directives: front desk for pick up  Follow up in one year for your annual wellness visit.   Preventive Care 20 Years and Older, Female Preventive care refers to lifestyle choices and visits with your health care provider that can promote health and wellness. What does preventive care include?  A yearly physical exam. This is also called an annual well check.  Dental exams once or twice a year.  Routine eye exams. Ask your health care provider how often you should have your eyes checked.  Personal lifestyle choices, including:  Daily care of your teeth and gums.  Regular physical activity.  Eating a healthy diet.  Avoiding tobacco and drug use.  Limiting alcohol use.  Practicing safe sex.  Taking low-dose aspirin every day.  Taking vitamin and mineral supplements as  recommended by your health care provider. What happens during an annual well check? The services and screenings done by your health care provider during your annual well check will depend on your age, overall health, lifestyle risk factors, and family history of disease. Counseling  Your health care provider may ask you questions about your:  Alcohol use.  Tobacco use.  Drug use.  Emotional well-being.  Home and relationship well-being.  Sexual activity.  Eating habits.  History of falls.  Memory and ability to understand (cognition).  Work and work Statistician.  Reproductive health. Screening  You may have the following tests or measurements:  Height, weight, and BMI.  Blood pressure.  Lipid and cholesterol levels. These may be checked every 5 years, or more frequently if you are over 76 years old.  Skin check.  Lung cancer screening. You may have this screening every year starting at age 78 if you have a 30-pack-year history of smoking and currently smoke or have quit within the past 15 years.  Fecal occult blood test (FOBT) of the stool. You may have this test every year starting at age 64.  Flexible sigmoidoscopy or colonoscopy. You may have a sigmoidoscopy every 5 years or a colonoscopy every 10 years starting at age 53.  Hepatitis C blood test.  Hepatitis B blood test.  Sexually transmitted disease (STD) testing.  Diabetes screening. This is done by checking your blood sugar (glucose) after you have not eaten for a while (fasting). You may have this done every  1-3 years.  Bone density scan. This is done to screen for osteoporosis. You may have this done starting at age 39.  Mammogram. This may be done every 1-2 years. Talk to your health care provider about how often you should have regular mammograms. Talk with your health care provider about your test results, treatment options, and if necessary, the need for more tests. Vaccines  Your health care  provider may recommend certain vaccines, such as:  Influenza vaccine. This is recommended every year.  Tetanus, diphtheria, and acellular pertussis (Tdap, Td) vaccine. You may need a Td booster every 10 years.  Zoster vaccine. You may need this after age 57.  Pneumococcal 13-valent conjugate (PCV13) vaccine. One dose is recommended after age 11.  Pneumococcal polysaccharide (PPSV23) vaccine. One dose is recommended after age 46. Talk to your health care provider about which screenings and vaccines you need and how often you need them. This information is not intended to replace advice given to you by your health care provider. Make sure you discuss any questions you have with your health care provider. Document Released: 08/30/2015 Document Revised: 04/22/2016 Document Reviewed: 06/04/2015 Elsevier Interactive Patient Education  2017 [employer] Prevention in the Home Falls can cause injuries. They can happen to people of all ages. There are many things you can do to make your home safe and to help prevent falls. What can I do on the outside of my home?  Regularly fix the edges of walkways and driveways and fix any cracks.  Remove anything that might make you trip as you walk through a door, such as a raised step or threshold.  Trim any bushes or trees on the path to your home.  Use bright outdoor lighting.  Clear any walking paths of anything that might make someone trip, such as rocks or tools.  Regularly check to see if handrails are loose or broken. Make sure that both sides of any steps have handrails.  Any raised decks and porches should have guardrails on the edges.  Have any leaves, snow, or ice cleared regularly.  Use sand or salt on walking paths during winter.  Clean up any spills in your garage right away. This includes oil or grease spills. What can I do in the bathroom?  Use night lights.  Install grab bars by the toilet and in the tub and shower. Do  not use towel bars as grab bars.  Use non-skid mats or decals in the tub or shower.  If you need to sit down in the shower, use a plastic, non-slip stool.  Keep the floor dry. Clean up any water that spills on the floor as soon as it happens.  Remove soap buildup in the tub or shower regularly.  Attach bath mats securely with double-sided non-slip rug tape.  Do not have throw rugs and other things on the floor that can make you trip. What can I do in the bedroom?  Use night lights.  Make sure that you have a light by your bed that is easy to reach.  Do not use any sheets or blankets that are too big for your bed. They should not hang down onto the floor.  Have a firm chair that has side arms. You can use this for support while you get dressed.  Do not have throw rugs and other things on the floor that can make you trip. What can I do in the kitchen?  Clean up any spills right away.  Avoid walking on wet floors.  Keep items that you use a lot in easy-to-reach places.  If you need to reach something above you, use a strong step stool that has a grab bar.  Keep electrical cords out of the way.  Do not use floor polish or wax that makes floors slippery. If you must use wax, use non-skid floor wax.  Do not have throw rugs and other things on the floor that can make you trip. What can I do with my stairs?  Do not leave any items on the stairs.  Make sure that there are handrails on both sides of the stairs and use them. Fix handrails that are broken or loose. Make sure that handrails are as long as the stairways.  Check any carpeting to make sure that it is firmly attached to the stairs. Fix any carpet that is loose or worn.  Avoid having throw rugs at the top or bottom of the stairs. If you do have throw rugs, attach them to the floor with carpet tape.  Make sure that you have a light switch at the top of the stairs and the bottom of the stairs. If you do not have them,  ask someone to add them for you. What else can I do to help prevent falls?  Wear shoes that:  Do not have high heels.  Have rubber bottoms.  Are comfortable and fit you well.  Are closed at the toe. Do not wear sandals.  If you use a stepladder:  Make sure that it is fully opened. Do not climb a closed stepladder.  Make sure that both sides of the stepladder are locked into place.  Ask someone to hold it for you, if possible.  Clearly mark and make sure that you can see:  Any grab bars or handrails.  First and last steps.  Where the edge of each step is.  Use tools that help you move around (mobility aids) if they are needed. These include:  Canes.  Walkers.  Scooters.  Crutches.  Turn on the lights when you go into a dark area. Replace any light bulbs as soon as they burn out.  Set up your furniture so you have a clear path. Avoid moving your furniture around.  If any of your floors are uneven, fix them.  If there are any pets around you, be aware of where they are.  Review your medicines with your doctor. Some medicines can make you feel dizzy. This can increase your chance of falling. Ask your doctor what other things that you can do to help prevent falls. This information is not intended to replace advice given to you by your health care provider. Make sure you discuss any questions you have with your health care provider. Document Released: 05/30/2009 Document Revised: 01/09/2016 Document Reviewed: 09/07/2014 Elsevier Interactive Patient Education  2017 [employer].

## 2020-05-30 NOTE — Progress Notes (Addendum)
Subjective:   Jill Snow is a 86 y.o. female who presents for Medicare Annual (Subsequent) preventive examination.  Review of Systems    No ROS.  Medicare Wellness Virtual Visit.   Cardiac Risk Factors include: advanced age (>41men, >20 women);hypertension     Objective:    Today's Vitals   05/30/20 1107  BP: (!) 125/57  Pulse: 77  SpO2: 96%  Weight: 165 lb (74.8 kg)  Height: 5\' 7"  (1.702 m)   Body mass index is 25.84 kg/m.  Advanced Directives 05/30/2020 04/23/2020 05/19/2019 05/16/2018 05/16/2018 06/09/2017 11/30/2016  Does Patient Have a Medical Advance Directive? No No No No No Yes No  Type of Advance Directive - - - - - - -  Would patient like information on creating a medical advance directive? Yes (MAU/Ambulatory/Procedural Areas - Information given) No - Guardian declined No - Patient declined No - Patient declined Yes (MAU/Ambulatory/Procedural Areas - Information given) - No - Patient declined    Current Medications (verified) Outpatient Encounter Medications as of 05/30/2020  Medication Sig  . amiodarone (PACERONE) 200 MG tablet Take 1 tablet (200 mg) by mouth once daily as directed  . apixaban (ELIQUIS) 5 MG TABS tablet Take 1 tablet (5 mg total) by mouth 2 (two) times daily.  Marland Kitchen atorvastatin (LIPITOR) 40 MG tablet TAKE 1 TABLET EVERY DAY (Patient taking differently: Take 40 mg by mouth daily. )  . [employer] D3 50 MCG (2000 UT) CAPS TAKE 1 CAPSULE EVERY DAY (Patient taking differently: Take 2,000 Units by mouth daily. )  . fluticasone (FLONASE) 50 MCG/ACT nasal spray USE 2 SPRAYS IN EACH NOSTRIL EVERY DAY  . furosemide (LASIX) 20 MG tablet Take 1 tablet (20 mg) by mouth once daily. You may take an extra tablet (20 mg) by mouth once daily as needed for weight gain of 2 pounds overnight or 5 pounds in one week.  . IBU 400 MG tablet TAKE ONE TABLET BY MOUTH EVERY 8 HOURS AS NEEDED FOR MILD PAIN  . montelukast (SINGULAIR) 10 MG tablet TAKE 1 TABLET EVERY DAY (Patient  taking differently: Take 10 mg by mouth at bedtime. )  . vitamin E 400 UNIT capsule Take 400 Units by mouth daily.   No facility-administered encounter medications on file as of 05/30/2020.    Allergies (verified) Augmentin [amoxicillin-pot clavulanate], Codeine, and Crestor [rosuvastatin]   History: Past Medical History:  Diagnosis Date  . Atrial fibrillation ([place])   . Atrial flutter ([place])    a. s/p successful TEE/DCCV in 2014; b. TEE 2014 showed EF 45-50%, mild bi-atrial enlargement, mod MR  . Dyspnea   . History of chicken pox   . Hypercholesterolemia   . Hypertension   . Mitral regurgitation    a. echo 2014: EF 40-45%, mildly dilated RV, mod reduced RV systolic fxn, mod dilated LA, Mild to mod MR, mod TR, mildly elevated PASP  . PAF (paroxysmal atrial fibrillation) ([place])    a. initial episode 2011; b. not on long term anticoagulation since 06/2013  . RBBB (right bundle branch block)   . Seasonal allergies   . Sleep apnea    a. on CPAP   Past Surgical History:  Procedure Laterality Date  . ABDOMINAL HYSTERECTOMY    . TONSILLECTOMY AND ADENOIDECTOMY  1947  . tummy tuck     Family History  Problem Relation Age of Onset  . Stroke Mother   . Heart attack Mother   . Arthritis Mother   . Hyperlipidemia Mother   .  Heart disease Mother   . Diabetes Mother   . Diabetes Sister   . Cancer Daughter   . Heart disease Daughter   . Diabetes Daughter   . Diabetes Son   . Diabetes Maternal Grandmother   . Diabetes Maternal Grandfather    Social History   Socioeconomic History  . Marital status: Widowed    Spouse name: Not on file  . Number of children: 2  . Years of education: Not on file  . Highest education level: Not on file  Occupational History  . Occupation: REALTOR    Employer: [employer]  Tobacco Use  . Smoking status: Never Smoker  . Smokeless tobacco: Never Used  Vaping Use  . Vaping Use: Never used  Substance and Sexual Activity  . Alcohol use: No  .  Drug use: No  . Sexual activity: Never  Other Topics Concern  . Not on file  Social History Narrative  . Not on file   Social Determinants of Health   Financial Resource Strain: Low Risk   . Difficulty of Paying Living Expenses: Not hard at all  Food Insecurity: No Food Insecurity  . Worried About Charity fundraiser in the Last Year: Never true  . Ran Out of Food in the Last Year: Never true  Transportation Needs: No Transportation Needs  . Lack of Transportation (Medical): No  . Lack of Transportation (Non-Medical): No  Physical Activity:   . Days of Exercise per Week: Not on file  . Minutes of Exercise per Session: Not on file  Stress: No Stress Concern Present  . Feeling of Stress : Not at all  Social Connections: Unknown  . Frequency of Communication with Friends and Family: Not on file  . Frequency of Social Gatherings with Friends and Family: More than three times a week  . Attends Religious Services: Not on file  . Active Member of Clubs or Organizations: Not on file  . Attends Archivist Meetings: Not on file  . Marital Status: Not on file    Tobacco Counseling Counseling given: Not Answered   Clinical Intake:  Pre-visit preparation completed: Yes        Diabetes: No  How often do you need to have someone help you when you read instructions, pamphlets, or other written materials from your doctor or pharmacy?: 1 - Never       Activities of Daily Living In your present state of health, do you have any difficulty performing the following activities: 05/30/2020 04/24/2020  Hearing? N N  Vision? N N  Difficulty concentrating or making decisions? Tempie Donning  Walking or climbing stairs? Y Y  Dressing or bathing? N Y  Doing errands, shopping? Tempie Donning  Preparing Food and eating ? Y -  Using the Toilet? N -  In the past six months, have you accidently leaked urine? N -  Do you have problems with loss of bowel control? N -  Managing your Medications? Y -    Managing your Finances? Y -  Housekeeping or managing your Housekeeping? N -  Some recent data might be hidden    Patient Care Team: Leone Haven, MD as PCP - General (Family Medicine) Rockey Situ, Kathlene November, MD as PCP - Cardiology (Cardiology) Minna Merritts, MD as Consulting Physician (Cardiology)  Indicate any recent Medical Services you may have received from other than [facility] providers in the past year (date may be approximate).     Assessment:   This is  a routine wellness examination for [place].  I connected with A[place] today by telephone and verified that I am speaking with the correct person using two identifiers. Location patient: home Location provider: work Persons participating in the virtual visit: patient, daughter, Marine scientist.    I discussed the limitations, risks, security and privacy concerns of performing an evaluation and management service by telephone and the availability of in person appointments. The patient expressed understanding and verbally consented to this telephonic visit.    Interactive audio and video telecommunications were attempted between this provider and patient, however failed, due to patient having technical difficulties OR patient did not have access to video capability.  We continued and completed visit with audio only.  Some vital signs may be absent or patient reported.   Hearing/Vision screen  Hearing Screening   125Hz  250Hz  500Hz  1000Hz  2000Hz  3000Hz  4000Hz  6000Hz  8000Hz   Right ear:           Left ear:           Comments: Patient is able to hear conversational tones without difficulty. No issues reported.  Vision Screening Comments: Visual acuity not assessed, virtual visit.   Dietary issues and exercise activities discussed: Current Exercise Habits: Home exercise routine, Type of exercise: stretching;walking (chair exercises), Time (Minutes): 30, Intensity: Mild Healthy diet Good water intake  Goals    . Follow up with  Primary Care Provider     As needed      Depression Screen PHQ 2/9 Scores 05/30/2020 04/12/2020 12/29/2019 05/19/2019 05/16/2018 12/10/2016 08/13/2015  PHQ - 2 Score 0 0 0 0 0 0 0  PHQ- 9 Score - - - - - 0 -    Fall Risk Fall Risk  05/30/2020 04/12/2020 12/29/2019 05/19/2019 05/16/2018  Falls in the past year? 0 0 1 0 No  Number falls in past yr: 0 0 0 - -  Injury with Fall? - - 1 - -  Follow up Falls evaluation completed Falls evaluation completed Falls evaluation completed - -   Handrails in use when climbing stairs? Yes Home free of loose throw rugs in walkways, pet beds, electrical cords, etc? Yes  Adequate lighting in your home to reduce risk of falls? Yes   ASSISTIVE DEVICES UTILIZED TO PREVENT FALLS: Use of a cane, walker or w/c? No   TIMED UP AND GO: Was the test performed? No . Virtual visit.   Cognitive Function: Patient is alert. Dx Memory difficulty. Lives with and cared for by her adult children.   MMSE - Mini Mental State Exam 05/30/2020  Not completed: Unable to complete     6CIT Screen 05/16/2018  What Year? 4 points  What month? 3 points  What time? 0 points  Count back from 20 0 points  Months in reverse 4 points  Repeat phrase 2 points  Total Score 13    Immunizations Immunization History  Administered Date(s) Administered  . Hep A / Hep B 04/22/2016, 05/27/2016  . Influenza Split 05/31/2013  . Influenza, High Dose Seasonal PF 06/21/2017, 05/18/2018  . Influenza,inj,Quad PF,6+ Mos 05/15/2014, 06/14/2015, 04/22/2016  . PFIZER SARS-COV-2 Vaccination 12/11/2019  . Pneumococcal Conjugate-13 06/14/2015  . Pneumococcal Polysaccharide-23 07/18/2013  . Tdap 01/02/2014, 06/16/2018, 06/17/2018    Health Maintenance Health Maintenance  Topic Date Due  . URINE MICROALBUMIN  Never done  . DEXA SCAN  Never done  . INFLUENZA VACCINE  03/17/2020  . TETANUS/TDAP  06/17/2028  . PNA vac Low Risk Adult  Completed  . COVID-19 Vaccine  Discontinued   Covid  vaccine- second dose discontinued per patient preference.   Influenza vaccine- plans to receive later in the season. Deferred.   Dexa Scan- Deferred per patient preference.   Dental Screening: Recommended annual dental exams for proper oral hygiene  Community Resource Referral / Chronic Care Management: CRR required this visit?  No   CCM required this visit?  No      Plan:    Keep all routine maintenance appointments.   [employer] [place] at front desk for daughter to pick up per her request.   Nurse notes: Patient request to start home physical therapy to strengthen R hip area and avoid falls, per previous discussion with provider. Deferred to pcp for follow up.   I have personally reviewed and noted the following in the patient's chart:   . Medical and social history . Use of alcohol, tobacco or illicit drugs  . Current medications and supplements . Functional ability and status . Nutritional status . Physical activity . Advanced directives . List of other physicians . Hospitalizations, surgeries, and ER visits in previous 12 months . Vitals . Screenings to include cognitive, depression, and falls . Referrals and appointments  In addition, I have reviewed and discussed with patient certain preventive protocols, quality metrics, and best practice recommendations. A written personalized care plan for preventive services as well as general preventive health recommendations were provided to patient via mychart.     Varney Biles, LPN   [id number]

## 2020-06-03 ENCOUNTER — Ambulatory Visit (INDEPENDENT_AMBULATORY_CARE_PROVIDER_SITE_OTHER): Payer: Medicare HMO | Admitting: Family

## 2020-06-03 ENCOUNTER — Other Ambulatory Visit: Payer: Self-pay

## 2020-06-03 ENCOUNTER — Encounter: Payer: Self-pay | Admitting: Family

## 2020-06-03 VITALS — BP 130/70 | HR 66 | Ht 67.0 in | Wt 162.0 lb

## 2020-06-03 DIAGNOSIS — I2510 Atherosclerotic heart disease of native coronary artery without angina pectoris: Secondary | ICD-10-CM

## 2020-06-03 DIAGNOSIS — I10 Essential (primary) hypertension: Secondary | ICD-10-CM

## 2020-06-03 DIAGNOSIS — Z79899 Other long term (current) drug therapy: Secondary | ICD-10-CM

## 2020-06-03 DIAGNOSIS — E782 Mixed hyperlipidemia: Secondary | ICD-10-CM

## 2020-06-03 DIAGNOSIS — Z7901 Long term (current) use of anticoagulants: Secondary | ICD-10-CM | POA: Diagnosis not present

## 2020-06-03 DIAGNOSIS — I4510 Unspecified right bundle-branch block: Secondary | ICD-10-CM | POA: Diagnosis not present

## 2020-06-03 DIAGNOSIS — I4892 Unspecified atrial flutter: Secondary | ICD-10-CM

## 2020-06-03 NOTE — Patient Instructions (Addendum)
Medication Instructions:  No medication changes today.   *If you need a refill on your cardiac medications before your next appointment, please call your pharmacy*  Lab Work: No lab work today.  We will plan to get thyroid and liver numbers so make sure to drink water before that appointment.  Testing/Procedures: None ordered today.  Follow-Up: At [facility], you and your health needs are our priority.  As part of our continuing mission to provide you with exceptional heart care, we have created designated Provider Care Teams.  These Care Teams include your primary Cardiologist (physician) and Advanced Practice Providers (APPs -  Physician Assistants and Nurse Practitioners) who all work together to provide you with the care you need, when you need it.  We recommend signing up for the patient portal called "MyChart".  Sign up information is provided on this After Visit Summary.  MyChart is used to connect with patients for Virtual Visits (Telemedicine).  Patients are able to view lab/test results, encounter notes, upcoming appointments, etc.  Non-urgent messages can be sent to your provider as well.   To learn more about what you can do with MyChart, go to [email or web address].    Your next appointment:   3 month(s)  The format for your next appointment:   In Person  Provider:   You may see Ida Rogue, MD or one of the following Advanced Practice Providers on your designated Care Team:    Murray Hodgkins, NP  Christell Faith, PA-C  Marrianne Mood, PA-C  Cadence Starr, [place]  Laurann [place], NP

## 2020-06-03 NOTE — Progress Notes (Signed)
Office Visit    Patient Name: ADELIE CROSWELL Date of Encounter: 06/03/2020  Primary Care Provider:  Leone Haven, MD Primary Cardiologist:  Ida Rogue, MD Electrophysiologist:  None   Chief Complaint    BILLIEJO SORTO is a 86 y.o. female with a hx of HFrEF, PAF (July 2011, May 2021, September 2021), atrial flutter s/p cardioversion in 2014, HTN, HLD, OSA on CPAP presents today for follow-up of HFrEF and paroxysmal atrial flutter  Past Medical History    Past Medical History:  Diagnosis Date  . Atrial fibrillation ([place])   . Atrial flutter ([place])    a. s/p successful TEE/DCCV in 2014; b. TEE 2014 showed EF 45-50%, mild bi-atrial enlargement, mod MR  . Dyspnea   . History of chicken pox   . Hypercholesterolemia   . Hypertension   . Mitral regurgitation    a. echo 2014: EF 40-45%, mildly dilated RV, mod reduced RV systolic fxn, mod dilated LA, Mild to mod MR, mod TR, mildly elevated PASP  . PAF (paroxysmal atrial fibrillation) ([place])    a. initial episode 2011; b. not on long term anticoagulation since 06/2013  . RBBB (right bundle branch block)   . Seasonal allergies   . Sleep apnea    a. on CPAP   Past Surgical History:  Procedure Laterality Date  . ABDOMINAL HYSTERECTOMY    . TONSILLECTOMY AND ADENOIDECTOMY  1947  . tummy tuck      Allergies  Allergies  Allergen Reactions  . Augmentin [Amoxicillin-Pot Clavulanate] Nausea And Vomiting  . Codeine Anaphylaxis    "cant remember reaction"  . Crestor [Rosuvastatin] Other (See Comments)    Muscle pain/cramps     History of Present Illness    JORDY HEWINS is a 86 y.o. female with a hx of PAF (July 2011, May 2021, September 2021), HFrEF, atrial flutter s/p cardioversion in 2014, HTN, HLD, OSA on CPAP. She was last seen 05/06/20.  Evaluated September 2014 for atrial flutter in the setting of URI.  Minimal improvement with IV Cardizem and noted hypotension. Ultimately underwent TEE  cardioversion and rate difficult to control. TTE 05/16/2013 with LVEF 45-50%, mildly dilated LA/RA, moderate MR. She was started on anticoagulation.   In follow up 03/2018 was noted to be noncompliant with medications. Not taking anticoagulation nor amiodarone. She was recommended to monitor for recurrent arrhythmia.   She had a CT chest abdomen in 2018 showing mild aortic atherosclerosis and mild coronary artery calcification to the LAD and RCA by Dr. Donivan Scull review.   Admitted May 2021 in [place], [place] for cellulitis and infection after presenting with fever and left arm pain. A week prior she had mechanical fall, landed on left side, developed erythema and tenderness. Found to have LUE celluitis and LLL PNA with L pleural effusion. Treated with IV abx. Per documentation she had atrial fibrillation while in the ED but self converted to NSR. Unfortunately that EKG is unavailable for my review. Additional EKG reported as ST with occasional PVC and RBBB.   Clinic visit 12/26/19 noted multiple stories to how she injured her arm. Was planning to follow-up with neurology regarding cognitive changes. She was recommended for 14-day ZIO to assess for recurrent atrial fibrillation.  Subsequent ZIO showed predominantly NSR average heart rate 74 bpm, infrequent PVC/PAC, 3261 episodes of SVT with longest 40min17sec at rate of 103bpm and fastest 5 beats with max rate 203bpm.   Clinically to 01/30/2020 she was started on Xarelto.  She developed  nausea, vomiting, altered mental status and subsequently discontinued.  At follow-up 03/04/2020 with Dr. Rockey Situ she and her daughter declined anticoagulation. Metoprolol succinate 12.5 mg was added to her regimen.  Presented to [facility] on 04/23/2020 with generalized malaise and increased confusion.  She went to her PCP and was found to be in atrial fibrillation with RVR.  She was sent to the ED via EMS.  She received IV metoprolol and diltiazem for rate control with subsequent  hypotension.  Echo 9 7/21 EF 40-45%, global hypokinesis, mild LVH, indeterminate diastolic parameters, mildly elevated PASP, LA moderately dilated, mild MR, moderate TR.  Converted to NSR with IV amiodarone.  She was discharged on amiodarone 400 mg twice daily and Eliquis 5 mg twice daily.  She did have demand ischemia in the setting of delirium and atrial fibrillation.  Not recommended for ischemic evaluation.  Seen in follow up  05/06/20. She was maintaining NSR though had TWI in inferolateral leads likely due to RBBB. She was recommended to reduce Amiodarone to 200mg  BID x5 days then to 200mg  daily.   Presents today for follow-up with her daughter.  Her daughter assist with history taking.  Ms. Gaffin has upcoming appointment in December with neurology for concern for cognitive decline.  Tells me she recently got a new cat and has enjoyed spending time with her cat.  Reports no chest pain, pressure, tightness.  Weight is down 3 pounds compared to clinic visit 1 month ago.  Endorses eating a heart healthy, low-sodium diet.  Since staying with her daughter's full-time she has been eating a much healthier diet.  Reports she has not had to take extra diuretic due to weight gain.  Reports no shortness of breath at rest no dyspnea on exertion.  Plans to start physical therapy due to hip pain.  Tolerating Eliquis without bleeding difficulty.  She reports no falls.  BP at home has been routinely 120s over 70s.   EKGs/Labs/Other Studies Reviewed:   The following studies were reviewed today:  ZIO 12/2019 Normal sinus rhythm avg HR of 74 bpm.  Bundle Branch Block/IVCD was present.    3261 Supraventricular Tachycardia runs occurred, the run with the fastest interval lasting 5 beats with a max rate of 203 bpm, the longest lasting 1 min 17 secs with an avg rate of 103 bpm.   Isolated SVEs were occasional (1.2%, 8306), SVE Couplets were rare (<1.0%, 1714), and SVE Triplets were rare (<1.0%, 397).     Isolated VEs were rare (<1.0%, 3583), VE Couplets were rare (<1.0%, 40), and VE Triplets were rare (<1.0%, 4). Ventricular Bigeminy and Trigeminy were present.   No significant patient triggered events.  Carotid duplex 03/2017 Minimal plaque, bilaterally. Stable, 1-39% bilateral ICA stenosis. Patent vertebral arteries with antegrade flow. Normal subclavian arteries, bilaterally. Abnormal thyroid appearance-there is a well-circumscribed heterogenous, vascular structure in the left lobe of the thyroid measuring 2.3 cm x 2 cm.  Echo 03/2017 Left ventricle: The cavity size was normal. Systolic function was    normal. The estimated ejection fraction was in the range of 60%    to 65%. Wall motion was normal; there were no regional wall    motion abnormalities. Left ventricular diastolic function    parameters were normal.  - Left atrium: The atrium was mildly dilated.  - Right ventricle: Systolic function was normal.  - Pulmonary arteries: Systolic pressure was mildly elevated. PA    peak pressure: 35 mm Hg (S).   EKG: No EKG today.  EKG  independently reviewed from 05/06/2020 demonstrated SB 59 bpm with TWI in inferolateral leads. Maintaining NSR compared to EKG in hospital 04/23/20 though TWI in inferolateral leads are stable.   Recent Labs: 04/23/2020: B Natriuretic Peptide 1,599.1 04/24/2020: TSH 1.347 04/25/2020: ALT 20; BUN 14; Creatinine, Ser 0.97; Hemoglobin 14.5; Magnesium 2.3; Platelets 204; Potassium 3.8; Sodium 139  Recent Lipid Panel    Component Value Date/Time   CHOL 224 (H) 04/24/2020 0245   CHOL 281 (H) 03/07/2014 0832   TRIG 123 04/24/2020 0245   HDL 24 (L) 04/24/2020 0245   HDL 49 03/07/2014 0832   CHOLHDL 9.3 04/24/2020 0245   VLDL 25 04/24/2020 0245   LDLCALC 175 (H) 04/24/2020 0245   LDLCALC 202 (H) 03/07/2014 0832   LDLDIRECT 224.0 06/17/2018 1020    Home Medications   Current Meds  Medication Sig  . amiodarone (PACERONE) 200 MG tablet Take 1 tablet (200 mg) by  mouth once daily as directed  . apixaban (ELIQUIS) 5 MG TABS tablet Take 1 tablet (5 mg total) by mouth 2 (two) times daily.  Marland Kitchen atorvastatin (LIPITOR) 40 MG tablet TAKE 1 TABLET EVERY DAY (Patient taking differently: Take 40 mg by mouth daily. )  . [employer] D3 50 MCG (2000 UT) CAPS TAKE 1 CAPSULE EVERY DAY (Patient taking differently: Take 2,000 Units by mouth daily. )  . fluticasone (FLONASE) 50 MCG/ACT nasal spray USE 2 SPRAYS IN EACH NOSTRIL EVERY DAY  . furosemide (LASIX) 20 MG tablet Take 1 tablet (20 mg) by mouth once daily. You may take an extra tablet (20 mg) by mouth once daily as needed for weight gain of 2 pounds overnight or 5 pounds in one week.  . IBU 400 MG tablet TAKE ONE TABLET BY MOUTH EVERY 8 HOURS AS NEEDED FOR MILD PAIN  . montelukast (SINGULAIR) 10 MG tablet TAKE 1 TABLET EVERY DAY (Patient taking differently: Take 10 mg by mouth at bedtime. )  . vitamin E 400 UNIT capsule Take 400 Units by mouth daily.  . [DISCONTINUED] metoprolol succinate (TOPROL-XL) 25 MG 24 hr tablet    Review of Systems   All other systems reviewed and are otherwise negative except as noted above.  Physical Exam    VS:  BP 130/70 (BP Location: Left Arm, Patient Position: Sitting, Cuff Size: Normal)   Pulse 66   Ht 5\' 7"  (1.702 m)   Wt 162 lb (73.5 kg)   SpO2 94%   BMI 25.37 kg/m  , BMI Body mass index is 25.37 kg/m. GEN: Well nourished, well developed, in no acute distress. HEENT: normal. Neck: Supple, no JVD, carotid bruits, or masses. Cardiac: RRR, no murmurs, rubs, or gallops. No clubbing, cyanosis, edema.  Radials/PT 2+ and equal bilaterally.  Respiratory:  Respirations regular and unlabored, clear to auscultation bilaterally. GI: Soft, nontender, nondistended. MS: No deformity or atrophy. Skin: Warm and dry, no rash. Neuro:  Strength and sensation are intact. Psych: Normal affect. Alert to person, place, time - noted cognitive, memory impairment.  Assessment & Plan    1. PAF / On  amiodarone therapy /chronic anticoagulation- Diagnosis 2011 with atrial flutter requiring cardioversion 2014. Repeat occurrence at outside hospital 12/2019 and during recent admission 04/2020.EKG 05/06/2020 showed she was maintaining NSR.  Continue amiodarone 200 mg daily.  Metoprolol previously discontinued due to bradycardia, hypotension.  We will plan to perform amiodarone monitoring labs including TSH, liver panel at follow-up -she politely declines today as she is not well hydrated and has difficulty with lab sticks.  Continue Eliquis 5mg  BID (previous poor reaction to Xarelto).  Denies bleeding complications.  Does not meet criteria for reduced dose.  2. HFrEF - Echo 04/23/2020 LVEF 40-45%.  Euvolemic and well compensated on exam.  She is at dry weight of 160-165 lbs. Consider HFrEF tachycardia mediated in setting of admission for atrial for with RVR.  Continue Lasix 20 mg daily with an additional 20 mg as needed for weight gain of 2 pounds overnight or 5 pounds in 1 week, refill provided.  No beta-blocker no ARB due to relative hypotension which limits further escalation of GDMT.  3. HTN - BP well controlled. Continue current antihypertensive regimen.   4. HLD - Continue Atorvastatin.  5. ?Dementia - Daughter is present today to assist with history taking. Notes decline in cognitive function over last 6 months.  Upcoming appointment with neurology in December.  6. Lung nodule -CT chest 03/11/2020 stable 6 mm nodule in right upper lobe considered benign and stable 11 X 9 mm groundglass opacity in left upper lobe recommended for repeat CT in 12 months.  Continue to follow with PCP.Marland Kitchen  7. RBBB -noted on previous EKG.  Continue to monitor with.  EKG.  Denies lightheadedness, dizziness, near-syncope, syncope.  8. Coronary artery calcification on CT - No anginal symptoms, no indication for ischemic evaluation. Continue statin. No aspirin due to anticoagulation.  No beta-blocker due to previous hypotension  and bradycardia.  Disposition: Follow up in 3 month(s) with Dr. Rockey Situ or APP.   Loel Dubonnet, NP 06/03/2020, 4:30 PM

## 2020-08-07 ENCOUNTER — Telehealth (INDEPENDENT_AMBULATORY_CARE_PROVIDER_SITE_OTHER): Payer: Medicare HMO | Admitting: Family Medicine

## 2020-08-07 ENCOUNTER — Encounter: Payer: Self-pay | Admitting: Family Medicine

## 2020-08-07 VITALS — BP 122/67 | HR 64 | Temp 97.7°F | Ht 67.0 in | Wt 162.0 lb

## 2020-08-07 DIAGNOSIS — R0981 Nasal congestion: Secondary | ICD-10-CM

## 2020-08-07 NOTE — Progress Notes (Signed)
Virtual Visit via video Note  This visit type was conducted due to national recommendations for restrictions regarding the COVID-19 pandemic (e.g. social distancing).  This format is felt to be most appropriate for this patient at this time.  All issues noted in this document were discussed and addressed.  No physical exam was performed (except for noted visual exam findings with Video Visits).   I connected with Jill Snow today at  1:45 PM EST by a video enabled telemedicine application or telephone and verified that I am speaking with the correct person using two identifiers. Location patient: home Location provider: work Persons participating in the virtual visit: patient, provider, Janace Hoard (daughter)  I discussed the limitations, risks, security and privacy concerns of performing an evaluation and management service by telephone and the availability of in person appointments. I also discussed with the patient that there may be a patient responsible charge related to this service. The patient expressed understanding and agreed to proceed.  Reason for visit: same day visit  HPI: Congestion: Patient and her daughter both note this started on 12/18.  Developed congestion in her sinuses.  Developed some postnasal drip and cough with this.  No fevers.  No shortness of breath.  No taste or smell disturbances.  She is not vaccinated.  No known COVID-19 exposures though her daughter does note that the other side of the family that the patient stays with several days of the week has been sick and the baby has RSV.  She also notes 2 of the family members in that household have been on antibiotics though nobody has tested positive for Covid.  The patient has been using Coricidin for symptom management.   ROS: See pertinent positives and negatives per HPI.  Past Medical History:  Diagnosis Date  . Atrial fibrillation ([place])   . Atrial flutter ([place])    a. s/p successful TEE/DCCV in 2014; b. TEE  2014 showed EF 45-50%, mild bi-atrial enlargement, mod MR  . Dyspnea   . History of chicken pox   . Hypercholesterolemia   . Hypertension   . Mitral regurgitation    a. echo 2014: EF 40-45%, mildly dilated RV, mod reduced RV systolic fxn, mod dilated LA, Mild to mod MR, mod TR, mildly elevated PASP  . PAF (paroxysmal atrial fibrillation) ([place])    a. initial episode 2011; b. not on long term anticoagulation since 06/2013  . RBBB (right bundle branch block)   . Seasonal allergies   . Sleep apnea    a. on CPAP    Past Surgical History:  Procedure Laterality Date  . ABDOMINAL HYSTERECTOMY    . TONSILLECTOMY AND ADENOIDECTOMY  1947  . tummy tuck      Family History  Problem Relation Age of Onset  . Stroke Mother   . Heart attack Mother   . Arthritis Mother   . Hyperlipidemia Mother   . Heart disease Mother   . Diabetes Mother   . Diabetes Sister   . Cancer Daughter   . Heart disease Daughter   . Diabetes Daughter   . Diabetes Son   . Diabetes Maternal Grandmother   . Diabetes Maternal Grandfather     SOCIAL HX: Non-smoker   Current Outpatient Medications:  .  amiodarone (PACERONE) 200 MG tablet, Take 1 tablet (200 mg) by mouth once daily as directed, Disp: 90 tablet, Rfl: 1 .  apixaban (ELIQUIS) 5 MG TABS tablet, Take 1 tablet (5 mg total) by mouth 2 (two) times daily.,  Disp: 180 tablet, Rfl: 1 .  atorvastatin (LIPITOR) 40 MG tablet, TAKE 1 TABLET EVERY DAY (Patient taking differently: Take 40 mg by mouth daily.), Disp: 90 tablet, Rfl: 1 .  CVS D3 50 MCG (2000 UT) CAPS, TAKE 1 CAPSULE EVERY DAY (Patient taking differently: Take 2,000 Units by mouth daily.), Disp: 100 capsule, Rfl: 3 .  fluticasone (FLONASE) 50 MCG/ACT nasal spray, USE 2 SPRAYS IN EACH NOSTRIL EVERY DAY, Disp: 48 g, Rfl: 1 .  furosemide (LASIX) 20 MG tablet, Take 1 tablet (20 mg) by mouth once daily. You may take an extra tablet (20 mg) by mouth once daily as needed for weight gain of 2 pounds overnight or 5  pounds in one week., Disp: 90 tablet, Rfl: 1 .  IBU 400 MG tablet, TAKE ONE TABLET BY MOUTH EVERY 8 HOURS AS NEEDED FOR MILD PAIN, Disp: 30 tablet, Rfl: 0 .  montelukast (SINGULAIR) 10 MG tablet, TAKE 1 TABLET EVERY DAY (Patient taking differently: Take 10 mg by mouth at bedtime.), Disp: 90 tablet, Rfl: 1 .  vitamin E 400 UNIT capsule, Take 400 Units by mouth daily., Disp: , Rfl:   EXAM:  VITALS per patient if applicable:  GENERAL: alert, oriented, appears well and in no acute distress  HEENT: atraumatic, conjunttiva clear, no obvious abnormalities on inspection of external nose and ears  NECK: normal movements of the head and neck  LUNGS: on inspection no signs of respiratory distress, breathing rate appears normal, no obvious gross SOB, gasping or wheezing  CV: no obvious cyanosis  MS: moves all visible extremities without noticeable abnormality  PSYCH/NEURO: pleasant and cooperative, no obvious depression or anxiety, speech and thought processing grossly intact  ASSESSMENT AND PLAN:  Discussed the following assessment and plan:  Problem List Items Addressed This Visit    Sinus congestion - Primary    Patient with new onset symptoms consistent with a viral illness.  Discussed possibility of COVID-19, RSV, or some other virus.  Discussed that antibiotics would not play a role in treatment at this time given the duration of her symptoms.  Discussed testing for COVID-19, RSV, and flu.  This will be arranged to be completed tomorrow at the patient's daughter's request.  I discussed that I would contact them when the test results return.  Discussed that if she was having persistent symptoms and the test results were negative then we would consider antibiotic therapy.  Discussed supportive care with Tylenol and Coricidin.  CMA advised patient of need to quarantine.      Relevant Orders   COVID-19, Flu A+B and RSV       I discussed the assessment and treatment plan with the patient.  The patient was provided an opportunity to ask questions and all were answered. The patient agreed with the plan and demonstrated an understanding of the instructions.   The patient was advised to call back or seek an in-person evaluation if the symptoms worsen or if the condition fails to improve as anticipated.   [name], MD

## 2020-08-07 NOTE — Assessment & Plan Note (Signed)
Patient with new onset symptoms consistent with a viral illness.  Discussed possibility of COVID-19, RSV, or some other virus.  Discussed that antibiotics would not play a role in treatment at this time given the duration of her symptoms.  Discussed testing for COVID-19, RSV, and flu.  This will be arranged to be completed tomorrow at the patient's daughter's request.  I discussed that I would contact them when the test results return.  Discussed that if she was having persistent symptoms and the test results were negative then we would consider antibiotic therapy.  Discussed supportive care with Tylenol and Coricidin.  CMA advised patient of need to quarantine.

## 2020-08-08 ENCOUNTER — Other Ambulatory Visit: Payer: Self-pay

## 2020-08-08 ENCOUNTER — Other Ambulatory Visit: Payer: Medicare HMO

## 2020-08-08 DIAGNOSIS — R0981 Nasal congestion: Secondary | ICD-10-CM

## 2020-08-10 ENCOUNTER — Telehealth: Payer: Self-pay | Admitting: Family Medicine

## 2020-08-10 LAB — COVID-19, FLU A+B AND RSV
Influenza A, NAA: NOT DETECTED
Influenza B, NAA: NOT DETECTED
RSV, NAA: NOT DETECTED
SARS-CoV-2, NAA: NOT DETECTED

## 2020-08-10 NOTE — Telephone Encounter (Signed)
Called and spoke with the patients daughter. She noted that the patient was feeling significantly better today and yesterday. She already saw the negative test results. She will let us know if she starts to feel worse.

## 2020-08-13 ENCOUNTER — Ambulatory Visit: Payer: Medicare HMO | Admitting: Neurology

## 2020-08-28 ENCOUNTER — Other Ambulatory Visit: Payer: Self-pay

## 2020-08-28 ENCOUNTER — Other Ambulatory Visit: Payer: Medicare HMO

## 2020-08-28 DIAGNOSIS — Z20822 Contact with and (suspected) exposure to covid-19: Secondary | ICD-10-CM

## 2020-08-30 LAB — SARS-COV-2, NAA 2 DAY TAT

## 2020-08-30 LAB — NOVEL CORONAVIRUS, NAA: SARS-CoV-2, NAA: NOT DETECTED

## 2020-09-04 ENCOUNTER — Other Ambulatory Visit: Payer: Self-pay | Admitting: Family
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
## 2020-09-09 ENCOUNTER — Encounter: Payer: Self-pay | Admitting: Cardiovascular Disease

## 2020-09-09 ENCOUNTER — Other Ambulatory Visit
Admission: RE | Admit: 2020-09-09 | Discharge: 2020-09-09 | Disposition: A | Discharge status: Home / Self Care | Payer: Medicare HMO | Source: Ambulatory Visit | Attending: Cardiovascular Disease | Admitting: Cardiovascular Disease

## 2020-09-09 ENCOUNTER — Ambulatory Visit: Payer: Medicare HMO | Admitting: Cardiovascular Disease

## 2020-09-09 ENCOUNTER — Other Ambulatory Visit: Payer: Self-pay

## 2020-09-09 VITALS — BP 124/60 | HR 58 | Ht 68.0 in | Wt 158.0 lb

## 2020-09-09 DIAGNOSIS — I10 Essential (primary) hypertension: Secondary | ICD-10-CM | POA: Diagnosis not present

## 2020-09-09 DIAGNOSIS — I2510 Atherosclerotic heart disease of native coronary artery without angina pectoris: Secondary | ICD-10-CM | POA: Insufficient documentation

## 2020-09-09 DIAGNOSIS — I700 Atherosclerosis of aorta: Secondary | ICD-10-CM

## 2020-09-09 DIAGNOSIS — F0390 Unspecified dementia without behavioral disturbance: Secondary | ICD-10-CM | POA: Diagnosis not present

## 2020-09-09 DIAGNOSIS — I480 Paroxysmal atrial fibrillation: Secondary | ICD-10-CM

## 2020-09-09 DIAGNOSIS — E7800 Pure hypercholesterolemia, unspecified: Secondary | ICD-10-CM

## 2020-09-09 DIAGNOSIS — E782 Mixed hyperlipidemia: Secondary | ICD-10-CM | POA: Diagnosis not present

## 2020-09-09 DIAGNOSIS — I4892 Unspecified atrial flutter: Secondary | ICD-10-CM | POA: Diagnosis not present

## 2020-09-09 LAB — BASIC METABOLIC PANEL
Anion gap: 12 (ref 5–15)
BUN: 13 mg/dL (ref 8–23)
CO2: 28 mmol/L (ref 22–32)
Calcium: 9.3 mg/dL (ref 8.9–10.3)
Chloride: 99 mmol/L (ref 98–111)
Creatinine, Ser: 0.99 mg/dL (ref 0.44–1.00)
GFR, Estimated: 56 mL/min — ABNORMAL LOW (ref >60)
Glucose, Bld: 95 mg/dL (ref 70–99)
Potassium: 3.7 mmol/L (ref 3.5–5.1)
Sodium: 139 mmol/L (ref 135–145)

## 2020-09-09 NOTE — Patient Instructions (Addendum)
Medication Instructions:  No changes  If you need a refill on your cardiac medications before your next appointment, please call your pharmacy.    Lab work: [employer] in hospital today   Your labs (blood work) drawn today and your tests are completely normal, you will receive your results only by: Marland Kitchen MyChart Message (if you have MyChart) OR . A paper copy in the mail If you have any lab test that is abnormal or we need to change your treatment, we will call you to review the results.   Testing/Procedures: No new testing needed   Follow-Up: At [facility], you and your health needs are our priority.  As part of our continuing mission to provide you with exceptional heart care, we have created designated Provider Care Teams.  These Care Teams include your primary Cardiologist (physician) and Advanced Practice Providers (APPs -  Physician Assistants and Nurse Practitioners) who all work together to provide you with the care you need, when you need it.  . You will need a follow up appointment in 6 months  . Providers on your designated Care Team:   . Murray Hodgkins, NP . Christell Faith, PA-C . Marrianne Mood, PA-C  Any Other Special Instructions Will Be Listed Below (If Applicable).  COVID-19 Vaccine Information can be found at: [email or web address].uk For questions related to vaccine distribution or appointments, please email [email or web address] or call [phone].

## 2020-10-14 ENCOUNTER — Telehealth: Payer: Self-pay

## 2020-10-14 NOTE — Telephone Encounter (Signed)
The last 3 OV notes was [facility]ed to [facility] after patient request and medical records release.  Sedra Morfin,cma

## 2020-11-04 ENCOUNTER — Ambulatory Visit: Payer: Medicare HMO | Admitting: Neurology

## 2020-11-04 ENCOUNTER — Other Ambulatory Visit (INDEPENDENT_AMBULATORY_CARE_PROVIDER_SITE_OTHER): Payer: Medicare HMO

## 2020-11-04 ENCOUNTER — Encounter: Payer: Self-pay | Admitting: Neurology

## 2020-11-04 ENCOUNTER — Other Ambulatory Visit: Payer: Self-pay

## 2020-11-04 VITALS — BP 145/86 | HR 70 | Ht 68.0 in | Wt 159.0 lb

## 2020-11-04 DIAGNOSIS — R413 Other amnesia: Secondary | ICD-10-CM

## 2020-11-04 DIAGNOSIS — G301 Alzheimer's disease with late onset: Secondary | ICD-10-CM | POA: Diagnosis not present

## 2020-11-04 DIAGNOSIS — F02818 Dementia in other diseases classified elsewhere, unspecified severity, with other behavioral disturbance: Secondary | ICD-10-CM

## 2020-11-04 DIAGNOSIS — F0281 Dementia in other diseases classified elsewhere with behavioral disturbance: Secondary | ICD-10-CM | POA: Diagnosis not present

## 2020-11-04 LAB — VITAMIN B12: Vitamin B-12: 429 pg/mL (ref 211–911)

## 2020-11-04 NOTE — Progress Notes (Signed)
NEUROLOGY CONSULTATION NOTE  Jill Snow MRN: [id number] DOB: 1933-12-27  Referring provider: Dr. [name] Primary care provider: Dr. [name]  Reason for consult:  Memory loss  Dear Dr Caryl Bis:  Thank you for your kind referral of Jill Snow for consultation of the above symptoms. Although her history is well known to you, please allow me to reiterate it for the purpose of our medical record. The patient was accompanied to the clinic by her daughter who also provides collateral information. Records and images were personally reviewed where available.   HISTORY OF PRESENT ILLNESS: This is an 87 year old right-handed woman with a history of atrial fibrillation on Eliquis, arthritis, hypertension, OSA on CPAP, presenting for evaluation of memory loss. Her daughter Janace Hoard is present to provide additional information. She feels her memory is pretty good. She has word-finding difficulties, noted during her visit today as well. She has been living with her son Herbie [place] for the past 6 years or so. Family started noticing changes around 4 years ago. She was wearing the same clothes. She was leaving food on the stove, they had to take the knobs off  The stove, as well as the washing machine knobs. She was missing medications, Angie started managing them over a year ago. They made a decision to have 24/7 care 1.5 years ago, especially since she started the Eliquis. She started having difficulties managing finances around 2 years ago, overspending, not realizing the bank would do overdraft so she was always out of money. When she was unsupervised, she was not eating well, family has noticed that with balanced meals, she was repeating herself less and there was improvement in personality as well. She does very well with routines, when she is out of routine, she would get more agitated. She stopped driving over a year ago. She is independent with dressing and bathing. She was  hospitalized in 04/2020 for atrial fibrillation with RVR, at that time she had delirium, cussing and agitated. When sick, she would have hallucinations, "talking out of her head." These were not as bad once she got home. Sleep is overall good, melatonin helps. They have also found that vitamin D supplements help with sleep issues. She has always had high anxiety, she feels her mood is good. When anxiety is up, such as when she is out of routine staying with Angie, she would be up throughout the night. Melatonin helps. No wandering behaviors, she tries to get out the door when she gets upset at [facility], but this has only occurred a couple of times.   She denies any headaches, dizziness, diplopia, dysarthria, dysphagia, neck/back pain, focal numbness/tingling/weakness, bowel/bladder dysfunction. No anosmia, tremors, no falls. She has right hand arthritis. No family history of dementia, no history of significant head injuries or alcohol use.  .   Laboratory Data: Lab Results  Component Value Date   TSH 1.347 04/24/2020   Lab Results  Component Value Date   [id number] 06/27/2018     PAST MEDICAL HISTORY: Past Medical History:  Diagnosis Date  . Atrial fibrillation ([place])   . Atrial flutter ([place])    a. s/p successful TEE/DCCV in 2014; b. TEE 2014 showed EF 45-50%, mild bi-atrial enlargement, mod MR  . Dyspnea   . History of chicken pox   . Hypercholesterolemia   . Hypertension   . Mitral regurgitation    a. echo 2014: EF 40-45%, mildly dilated RV, mod reduced RV systolic fxn, mod dilated LA, Mild to  mod MR, mod TR, mildly elevated PASP  . PAF (paroxysmal atrial fibrillation) ([place])    a. initial episode 2011; b. not on long term anticoagulation since 06/2013  . RBBB (right bundle branch block)   . Seasonal allergies   . Sleep apnea    a. on CPAP    PAST SURGICAL HISTORY: Past Surgical History:  Procedure Laterality Date  . ABDOMINAL HYSTERECTOMY    . TONSILLECTOMY AND ADENOIDECTOMY   1947  . tummy tuck      MEDICATIONS: Current Outpatient Medications on File Prior to Visit  Medication Sig Dispense Refill  . amiodarone (PACERONE) 200 MG tablet TAKE 1 TABLET (200 MG) BY MOUTH ONCE DAILY AS DIRECTED 90 tablet 1  . apixaban (ELIQUIS) 5 MG TABS tablet Take 1 tablet (5 mg total) by mouth 2 (two) times daily. 180 tablet 1  . atorvastatin (LIPITOR) 40 MG tablet TAKE 1 TABLET EVERY DAY 90 tablet 1  . CVS D3 50 MCG (2000 UT) CAPS TAKE 1 CAPSULE EVERY DAY (Patient taking differently: 10,000 Units.) 100 capsule 3  . fluticasone (FLONASE) 50 MCG/ACT nasal spray USE 2 SPRAYS IN EACH NOSTRIL EVERY DAY 48 g 1  . furosemide (LASIX) 20 MG tablet TAKE 1 TABLET DAILY. MAY TAKE 1 EXTRA TABLET DAILY AS NEEDED FOR WEIGHT GAIN OF 2 POUNDS OVERNIGHT OR 5 POUNDS IN 1 WEEK 90 tablet 1  . IBU 400 MG tablet TAKE ONE TABLET BY MOUTH EVERY 8 HOURS AS NEEDED FOR MILD PAIN 30 tablet 0  . montelukast (SINGULAIR) 10 MG tablet TAKE 1 TABLET EVERY DAY 90 tablet 1  . vitamin E 400 UNIT capsule Take 400 Units by mouth daily.     No current facility-administered medications on file prior to visit.    ALLERGIES: Allergies  Allergen Reactions  . Augmentin [Amoxicillin-Pot Clavulanate] Nausea And Vomiting  . Codeine Anaphylaxis    "cant remember reaction"  . Crestor [Rosuvastatin] Other (See Comments)    Muscle pain/cramps     FAMILY HISTORY: Family History  Problem Relation Age of Onset  . Stroke Mother   . Heart attack Mother   . Arthritis Mother   . Hyperlipidemia Mother   . Heart disease Mother   . Diabetes Mother   . Diabetes Sister   . Cancer Daughter   . Heart disease Daughter   . Diabetes Daughter   . Diabetes Son   . Diabetes Maternal Grandmother   . Diabetes Maternal Grandfather     SOCIAL HISTORY: Social History   Socioeconomic History  . Marital status: Widowed    Spouse name: Not on file  . Number of children: 2  . Years of education: Not on file  . Highest education  level: Not on file  Occupational History  . Occupation: REALTOR    Employer: [employer]  Tobacco Use  . Smoking status: Never Smoker  . Smokeless tobacco: Never Used  Vaping Use  . Vaping Use: Never used  Substance and Sexual Activity  . Alcohol use: No  . Drug use: No  . Sexual activity: Never  Other Topics Concern  . Not on file  Social History Narrative  . Not on file   Social Determinants of Health   Financial Resource Strain: Low Risk   . Difficulty of Paying Living Expenses: Not hard at all  Food Insecurity: No Food Insecurity  . Worried About Charity fundraiser in the Last Year: Never true  . Ran Out of Food in the Last Year: Never  true  Transportation Needs: No Transportation Needs  . Lack of Transportation (Medical): No  . Lack of Transportation (Non-Medical): No  Physical Activity: Not on file  Stress: No Stress Concern Present  . Feeling of Stress : Not at all  Social Connections: Unknown  . Frequency of Communication with Friends and Family: Not on file  . Frequency of Social Gatherings with Friends and Family: More than three times a week  . Attends Religious Services: Not on file  . Active Member of Clubs or Organizations: Not on file  . Attends Archivist Meetings: Not on file  . Marital Status: Not on file  Intimate Partner Violence: Not on file     PHYSICAL EXAM: Vitals:   11/04/20 1039  BP: (!) 145/86  Pulse: 70  SpO2: 98%   General: No acute distress Head:  Normocephalic/atraumatic Skin/Extremities: No rash, no edema Neurological Exam: Mental status: alert and oriented to person, state. No dysarthria or aphasia, Fund of knowledge is reduced.  Recent and remote memory are impaired.  Attention and concentration are reduced.  Unable to name objects and repeat phrases. MMSE 8/30 MMSE - Mini Mental State Exam 11/04/2020 05/30/2020  Not completed: - Unable to complete  Orientation to time 0 -  Orientation to Place 1 -  Registration 3  -  Attention/ Calculation 0 -  Recall 0 -  Language- name 2 objects 0 -  Language- repeat 0 -  Language- follow 3 step command 3 -  Language- read & follow direction 1 -  Write a sentence 0 -  Copy design 0 -  Total score 8 -    Cranial nerves: CN I: not tested CN II: pupils equal, round and reactive to light, visual fields intact CN III, IV, VI:  full range of motion, no nystagmus, no ptosis CN V: facial sensation intact CN VII: upper and lower face symmetric CN VIII: hearing intact to conversation CN XI: sternocleidomastoid and trapezius muscles intact CN XII: tongue midline Bulk & Tone: normal, no fasciculations. Motor: 5/5 throughout with no pronator drift. Sensation: intact to light touch, cold, pin, vibration sense.  No extinction to double simultaneous stimulation.  Romberg test negative Deep Tendon Reflexes: +2 throughout Cerebellar: no incoordination on finger to nose testing Gait: narrow-based and steady, no ataxia Tremor: none   IMPRESSION: This is an 87 year old right-handed woman with a history of atrial fibrillation on Eliquis, arthritis, hypertension, OSA on CPAP, presenting for evaluation of memory loss. Her neurological exam is non-focal, MOCA score today 8/30. We discussed the diagnosis of dementia, likely Alzheimer's disease. No prior brain imaging, head CT without contrast will be ordered to assess for underlying structural abnormality. Check B12 level. We discussed medications used in dementia, including side effects and expectations from medication. At this point, we agreed that potential side effects/risks outweigh potential benefits. She has 24/7 care, we discussed the importance of home safety, control of vascular risk factors, physical exercises, and brain stimulation exercises for brain health. All of Angie's questions and concerns regarding diagnosis, prognosis, were discussed. Follow-up as needed, call for any changes.    Thank you for allowing me to  participate in the care of this patient. Please do not hesitate to call for any questions or concerns.   Ellouise Newer, M.D.  CC: Dr. Caryl Bis

## 2020-11-04 NOTE — Patient Instructions (Signed)
1. Schedule head CT without contrast  2. Bloodwork for B12   FALL PRECAUTIONS: Be cautious when walking. Scan the area for obstacles that may increase the risk of trips and falls. When getting up in the mornings, sit up at the edge of the bed for a few minutes before getting out of bed. Consider elevating the bed at the head end to avoid drop of blood pressure when getting up. Walk always in a well-lit room (use night lights in the walls). Avoid area rugs or power cords from appliances in the middle of the walkways. Use a walker or a cane if necessary and consider physical therapy for balance exercise. Get your eyesight checked regularly.   HOME SAFETY: Consider the safety of the kitchen when operating appliances like stoves, microwave oven, and blender. Consider having supervision and share cooking responsibilities until no longer able to participate in those. Accidents with firearms and other hazards in the house should be identified and addressed as well.   ABILITY TO BE LEFT ALONE: If patient is unable to contact 911 operator, consider using LifeLine, or when the need is there, arrange for someone to stay with patients. Smoking is a fire hazard, consider supervision or cessation. Risk of wandering should be assessed by caregiver and if detected at any point, supervision and safe proof recommendations should be instituted.   RECOMMENDATIONS FOR ALL PATIENTS WITH MEMORY PROBLEMS: 1. Continue to exercise (Recommend 30 minutes of walking everyday, or 3 hours every week) 2. Increase social interactions - continue going to [place] and enjoy social gatherings with friends and family 3. Eat healthy, avoid fried foods and eat more fruits and vegetables 4. Maintain adequate blood pressure, blood sugar, and blood cholesterol level. Reducing the risk of stroke and cardiovascular disease also helps promoting better memory. 5. Avoid stressful situations. Live a simple life and avoid aggravations. Organize  your time and prepare for the next day in anticipation. 6. Sleep well, avoid any interruptions of sleep and avoid any distractions in the bedroom that may interfere with adequate sleep quality 7. Avoid sugar, avoid sweets as there is a strong link between excessive sugar intake, diabetes, and cognitive impairment We discussed the Mediterranean diet, which has been shown to help patients reduce the risk of progressive memory disorders and reduces cardiovascular risk. This includes eating fish, eat fruits and green leafy vegetables, nuts like almonds and hazelnuts, walnuts, and also use olive oil. Avoid fast foods and fried foods as much as possible. Avoid sweets and sugar as sugar use has been linked to worsening of memory function.  There is always a concern of gradual progression of memory problems. If this is the case, then we may need to adjust level of care according to patient needs. Support, both to the patient and caregiver, should then be put into place.

## 2020-11-05 ENCOUNTER — Telehealth: Payer: Self-pay

## 2020-11-05 NOTE — Telephone Encounter (Signed)
-----   Message from Cameron Sprang, MD sent at 11/05/2020 10:43 AM EDT ----- Pls let daughter know B12 was normal, thanks

## 2020-11-05 NOTE — Telephone Encounter (Signed)
Pt daughter called no answer Per DPR left a voice mail that b12 was normal any questions can call the office at [phone]

## 2020-11-07 ENCOUNTER — Other Ambulatory Visit: Payer: Self-pay | Admitting: Family

## 2020-11-07 NOTE — Telephone Encounter (Signed)
Refill request

## 2020-11-07 NOTE — Telephone Encounter (Signed)
26f, 72.1kg, scr 0.99 09/09/20, lovw/gollan 09/09/20

## 2020-11-11 DIAGNOSIS — I480 Paroxysmal atrial fibrillation: Secondary | ICD-10-CM | POA: Diagnosis not present

## 2020-11-11 DIAGNOSIS — I739 Peripheral vascular disease, unspecified: Secondary | ICD-10-CM | POA: Diagnosis not present

## 2020-11-11 DIAGNOSIS — Z8679 Personal history of other diseases of the circulatory system: Secondary | ICD-10-CM | POA: Diagnosis not present

## 2020-11-11 DIAGNOSIS — I2510 Atherosclerotic heart disease of native coronary artery without angina pectoris: Secondary | ICD-10-CM | POA: Diagnosis not present

## 2020-11-11 DIAGNOSIS — Z7901 Long term (current) use of anticoagulants: Secondary | ICD-10-CM | POA: Diagnosis not present

## 2020-11-11 DIAGNOSIS — I509 Heart failure, unspecified: Secondary | ICD-10-CM | POA: Diagnosis not present

## 2020-11-11 DIAGNOSIS — F0390 Unspecified dementia without behavioral disturbance: Secondary | ICD-10-CM | POA: Diagnosis not present

## 2020-11-11 DIAGNOSIS — D6869 Other thrombophilia: Secondary | ICD-10-CM | POA: Diagnosis not present

## 2020-11-18 DIAGNOSIS — B9689 Other specified bacterial agents as the cause of diseases classified elsewhere: Secondary | ICD-10-CM | POA: Diagnosis not present

## 2020-11-18 DIAGNOSIS — J019 Acute sinusitis, unspecified: Secondary | ICD-10-CM | POA: Diagnosis not present

## 2020-11-19 DIAGNOSIS — N390 Urinary tract infection, site not specified: Secondary | ICD-10-CM | POA: Diagnosis not present

## 2020-12-02 DIAGNOSIS — Z8709 Personal history of other diseases of the respiratory system: Secondary | ICD-10-CM | POA: Diagnosis not present

## 2020-12-02 DIAGNOSIS — Z09 Encounter for follow-up examination after completed treatment for conditions other than malignant neoplasm: Secondary | ICD-10-CM | POA: Diagnosis not present

## 2020-12-02 DIAGNOSIS — Z8619 Personal history of other infectious and parasitic diseases: Secondary | ICD-10-CM | POA: Diagnosis not present

## 2020-12-16 ENCOUNTER — Observation Stay (HOSPITAL_COMMUNITY)
Admission: EM | Admit: 2020-12-16 | Discharge: 2020-12-17 | Disposition: A | Discharge status: Home / Self Care | Payer: Medicare HMO | Attending: Family Medicine | Admitting: Family Medicine

## 2020-12-16 ENCOUNTER — Other Ambulatory Visit: Payer: Self-pay

## 2020-12-16 ENCOUNTER — Encounter (HOSPITAL_COMMUNITY): Payer: Self-pay | Admitting: Pharmacy Technician

## 2020-12-16 ENCOUNTER — Emergency Department (HOSPITAL_COMMUNITY): Payer: Medicare HMO

## 2020-12-16 DIAGNOSIS — A419 Sepsis, unspecified organism: Secondary | ICD-10-CM | POA: Diagnosis not present

## 2020-12-16 DIAGNOSIS — F0390 Unspecified dementia without behavioral disturbance: Secondary | ICD-10-CM | POA: Insufficient documentation

## 2020-12-16 DIAGNOSIS — K449 Diaphragmatic hernia without obstruction or gangrene: Secondary | ICD-10-CM | POA: Diagnosis not present

## 2020-12-16 DIAGNOSIS — K529 Noninfective gastroenteritis and colitis, unspecified: Secondary | ICD-10-CM | POA: Insufficient documentation

## 2020-12-16 DIAGNOSIS — Z79899 Other long term (current) drug therapy: Secondary | ICD-10-CM | POA: Insufficient documentation

## 2020-12-16 DIAGNOSIS — I110 Hypertensive heart disease with heart failure: Secondary | ICD-10-CM | POA: Insufficient documentation

## 2020-12-16 DIAGNOSIS — I480 Paroxysmal atrial fibrillation: Secondary | ICD-10-CM | POA: Insufficient documentation

## 2020-12-16 DIAGNOSIS — R413 Other amnesia: Secondary | ICD-10-CM | POA: Diagnosis present

## 2020-12-16 DIAGNOSIS — I10 Essential (primary) hypertension: Secondary | ICD-10-CM | POA: Diagnosis present

## 2020-12-16 DIAGNOSIS — I5022 Chronic systolic (congestive) heart failure: Secondary | ICD-10-CM | POA: Diagnosis present

## 2020-12-16 DIAGNOSIS — R1110 Vomiting, unspecified: Secondary | ICD-10-CM | POA: Diagnosis present

## 2020-12-16 DIAGNOSIS — I5032 Chronic diastolic (congestive) heart failure: Secondary | ICD-10-CM | POA: Diagnosis present

## 2020-12-16 DIAGNOSIS — E7800 Pure hypercholesterolemia, unspecified: Secondary | ICD-10-CM | POA: Diagnosis present

## 2020-12-16 DIAGNOSIS — I5023 Acute on chronic systolic (congestive) heart failure: Secondary | ICD-10-CM | POA: Diagnosis not present

## 2020-12-16 DIAGNOSIS — R509 Fever, unspecified: Secondary | ICD-10-CM | POA: Diagnosis not present

## 2020-12-16 DIAGNOSIS — R0602 Shortness of breath: Secondary | ICD-10-CM | POA: Diagnosis not present

## 2020-12-16 DIAGNOSIS — Z20822 Contact with and (suspected) exposure to covid-19: Secondary | ICD-10-CM | POA: Diagnosis not present

## 2020-12-16 DIAGNOSIS — Z9989 Dependence on other enabling machines and devices: Secondary | ICD-10-CM

## 2020-12-16 DIAGNOSIS — I4891 Unspecified atrial fibrillation: Secondary | ICD-10-CM | POA: Diagnosis not present

## 2020-12-16 DIAGNOSIS — Z7901 Long term (current) use of anticoagulants: Secondary | ICD-10-CM | POA: Diagnosis not present

## 2020-12-16 DIAGNOSIS — G4733 Obstructive sleep apnea (adult) (pediatric): Secondary | ICD-10-CM

## 2020-12-16 LAB — COMPREHENSIVE METABOLIC PANEL
ALT: 27 U/L (ref 0–44)
AST: 32 U/L (ref 15–41)
Albumin: 3.9 g/dL (ref 3.5–5.0)
Alkaline Phosphatase: 63 U/L (ref 38–126)
Anion gap: 10 (ref 5–15)
BUN: 16 mg/dL (ref 8–23)
CO2: 25 mmol/L (ref 22–32)
Calcium: 9.3 mg/dL (ref 8.9–10.3)
Chloride: 101 mmol/L (ref 98–111)
Creatinine, Ser: 1.05 mg/dL — ABNORMAL HIGH (ref 0.44–1.00)
GFR, Estimated: 52 mL/min — ABNORMAL LOW (ref >60)
Glucose, Bld: 132 mg/dL — ABNORMAL HIGH (ref 70–99)
Potassium: 3.7 mmol/L (ref 3.5–5.1)
Sodium: 136 mmol/L (ref 135–145)
Total Bilirubin: 1.9 mg/dL — ABNORMAL HIGH (ref 0.3–1.2)
Total Protein: 7.2 g/dL (ref 6.5–8.1)

## 2020-12-16 LAB — URINALYSIS, ROUTINE W REFLEX MICROSCOPIC
Bacteria, UA: NONE SEEN
Bilirubin Urine: NEGATIVE
Glucose, UA: NEGATIVE mg/dL
Ketones, ur: NEGATIVE mg/dL
Leukocytes,Ua: NEGATIVE
Nitrite: NEGATIVE
Protein, ur: NEGATIVE mg/dL
Specific Gravity, Urine: 1.021 (ref 1.005–1.030)
pH: 5 (ref 5.0–8.0)

## 2020-12-16 LAB — PROTIME-INR
INR: 1.2 (ref 0.8–1.2)
Prothrombin Time: 15.6 seconds — ABNORMAL HIGH (ref 11.4–15.2)

## 2020-12-16 LAB — CBC
HCT: 43.5 % (ref 36.0–46.0)
Hemoglobin: 14.4 g/dL (ref 12.0–15.0)
MCH: 32.6 pg (ref 26.0–34.0)
MCHC: 33.1 g/dL (ref 30.0–36.0)
MCV: 98.4 fL (ref 80.0–100.0)
Platelets: 177 10*3/uL (ref 150–400)
RBC: 4.42 MIL/uL (ref 3.87–5.11)
RDW: 12.4 % (ref 11.5–15.5)
WBC: 8.3 10*3/uL (ref 4.0–10.5)
nRBC: 0 % (ref 0.0–0.2)

## 2020-12-16 LAB — RESP PANEL BY RT-PCR (FLU A&B, COVID) ARPGX2
Influenza A by PCR: NEGATIVE
Influenza B by PCR: NEGATIVE
SARS Coronavirus 2 by RT PCR: NEGATIVE

## 2020-12-16 LAB — LIPASE, BLOOD: Lipase: 36 U/L (ref 11–51)

## 2020-12-16 LAB — LACTIC ACID, PLASMA: Lactic Acid, Venous: 1.4 mmol/L (ref 0.5–1.9)

## 2020-12-16 LAB — PROCALCITONIN: Procalcitonin: 0.13 ng/mL

## 2020-12-16 LAB — APTT: aPTT: 30 seconds (ref 24–36)

## 2020-12-16 MED ORDER — SODIUM CHLORIDE 0.9 % IV SOLN
1.0000 g | INTRAVENOUS | Status: DC
  Administered 2020-12-16: 1 g via INTRAVENOUS
  Filled 2020-12-16: qty 10

## 2020-12-16 MED ORDER — ONDANSETRON HCL 4 MG/2ML IJ SOLN: 4.0000 mg | Freq: Four times a day (QID) | INTRAMUSCULAR | Status: DC | PRN

## 2020-12-16 MED ORDER — LACTATED RINGERS IV BOLUS
500.0000 mL | Freq: Once | INTRAVENOUS | Status: AC
  Administered 2020-12-16: 500 mL via INTRAVENOUS

## 2020-12-16 MED ORDER — MONTELUKAST SODIUM 10 MG PO TABS
10.0000 mg | ORAL_TABLET | Freq: Every day | ORAL | Status: DC
  Administered 2020-12-17: 10 mg via ORAL
  Filled 2020-12-16: qty 1

## 2020-12-16 MED ORDER — ACETAMINOPHEN 325 MG PO TABS
650.0000 mg | ORAL_TABLET | Freq: Four times a day (QID) | ORAL | Status: DC | PRN
  Administered 2020-12-17: 650 mg via ORAL
  Filled 2020-12-16: qty 2

## 2020-12-16 MED ORDER — VANCOMYCIN HCL 1000 MG/200ML IV SOLN: 1000.0000 mg | INTRAVENOUS | Status: DC

## 2020-12-16 MED ORDER — ATORVASTATIN CALCIUM 40 MG PO TABS
40.0000 mg | ORAL_TABLET | Freq: Every day | ORAL | Status: DC
  Administered 2020-12-17: 40 mg via ORAL
  Filled 2020-12-16: qty 1

## 2020-12-16 MED ORDER — MELATONIN 5 MG PO TABS
5.0000 mg | ORAL_TABLET | Freq: Every day | ORAL | Status: DC
  Administered 2020-12-16: 5 mg via ORAL
  Filled 2020-12-16: qty 1

## 2020-12-16 MED ORDER — APIXABAN 5 MG PO TABS
5.0000 mg | ORAL_TABLET | Freq: Two times a day (BID) | ORAL | Status: DC
  Administered 2020-12-16 – 2020-12-17 (×2): 5 mg via ORAL
  Filled 2020-12-16 (×2): qty 1

## 2020-12-16 MED ORDER — ACETAMINOPHEN 500 MG PO TABS
1000.0000 mg | ORAL_TABLET | Freq: Once | ORAL | Status: AC
  Administered 2020-12-16: 1000 mg via ORAL
  Filled 2020-12-16: qty 2

## 2020-12-16 MED ORDER — ONDANSETRON HCL 4 MG PO TABS: 4.0000 mg | ORAL_TABLET | Freq: Four times a day (QID) | ORAL | Status: DC | PRN

## 2020-12-16 MED ORDER — LACTATED RINGERS IV SOLN: INTRAVENOUS | Status: DC

## 2020-12-16 MED ORDER — LACTATED RINGERS IV BOLUS
1000.0000 mL | Freq: Once | INTRAVENOUS | Status: AC
  Administered 2020-12-16: 1000 mL via INTRAVENOUS

## 2020-12-16 MED ORDER — ONDANSETRON HCL 4 MG/2ML IJ SOLN
4.0000 mg | Freq: Once | INTRAMUSCULAR | Status: AC
  Administered 2020-12-16: 4 mg via INTRAVENOUS
  Filled 2020-12-16: qty 2

## 2020-12-16 MED ORDER — VANCOMYCIN HCL 1500 MG/300ML IV SOLN
1500.0000 mg | Freq: Once | INTRAVENOUS | Status: DC
  Filled 2020-12-16: qty 300

## 2020-12-16 MED ORDER — FLUTICASONE PROPIONATE 50 MCG/ACT NA SUSP: 2.0000 | Freq: Every day | NASAL | Status: DC

## 2020-12-16 MED ORDER — ACETAMINOPHEN 650 MG RE SUPP: 650.0000 mg | Freq: Four times a day (QID) | RECTAL | Status: DC | PRN

## 2020-12-16 MED ORDER — SODIUM CHLORIDE 0.9% FLUSH
3.0000 mL | Freq: Two times a day (BID) | INTRAVENOUS | Status: DC
  Administered 2020-12-16 – 2020-12-17 (×3): 3 mL via INTRAVENOUS

## 2020-12-16 MED ORDER — AMIODARONE HCL 200 MG PO TABS
200.0000 mg | ORAL_TABLET | Freq: Every day | ORAL | Status: DC
  Administered 2020-12-17: 200 mg via ORAL
  Filled 2020-12-16: qty 1

## 2020-12-16 NOTE — ED Triage Notes (Signed)
Pt bib family with reports emesis since yesterday. Family reports today pt was having a difficult time breathing and her oxygen saturation was 88% with temp 100.7. pt appears to be in no distress currently.

## 2020-12-16 NOTE — Progress Notes (Signed)
Pt arrived in room 2W 11 at 1745. Reaonna RN preformed skin/ assessment. Call bell at bedside within reach.

## 2020-12-16 NOTE — H&P (Signed)
History and Physical    Jill Snow [id number] DOB: 04-25-1934 DOA: 12/16/2020  PCP: Leone Haven, MD Consultants:  Rockey Situ - cardiology; Delice Lesch - neurology Patient coming from:  Home - lives with son; [place]: Son or daughter, Janace Hoard, [phone]  Chief Complaint: Fever, SOB  HPI: Jill Snow is a 87 y.o. female with medical history significant of OSA on CPAP; afib; HTN; HLD; dementia; and chronic systolic CHF presenting with SOB, O2 sat 88% and temp 100.7.  Her daughter said she was very tired Saturday. Yesterday, they both ate [place] [employer] and he vomited and then she vomited all night.  Her daughter went to see her this AM and her O2 was down to 88%.  She was lethargic, couldn't stay awake.  She also had a fever.  Pulse was high and BP was ok but HHN recommended ER evaluation.  Her son is fine now and no one else is sick.  No urinary symptoms.  No cough - but 3 weeks ago she had HHN evaluation for lethargy and they diagnosed sinusitis and possible UTI, given antibiotic Keflex.    ED Course:  Fever, sepsis. Emesis overnight, not acting herself, lethargic, weak, SOB.  Pulse ox 88% and temp 100.7 at home.  Unremarkable exam.  Temp 101.1.  3 weeks treated with Keflex for UTI/sinus - resolved, well since.  COVID/flu negative.  UA negative.  BP borderline, verging on shock.  Feels better after fluid.  Review of Systems: As per HPI; otherwise review of systems reviewed and negative.   Ambulatory Status:  Ambulates without assistance  COVID Vaccine Status:  First shot Therapist, music)  Past Medical History:  Diagnosis Date  . Atrial flutter ([place])    a. s/p successful TEE/DCCV in 2014; b. TEE 2014 showed EF 45-50%, mild bi-atrial enlargement, mod MR  . Dyspnea   . History of chicken pox   . Hypercholesterolemia   . Hypertension   . Mitral regurgitation    a. echo 2014: EF 40-45%, mildly dilated RV, mod reduced RV systolic fxn, mod dilated LA, Mild to mod MR, mod TR, mildly  elevated PASP  . PAF (paroxysmal atrial fibrillation) ([place])    a. initial episode 2011; b. not on long term anticoagulation since 06/2013  . RBBB (right bundle branch block)   . Seasonal allergies   . Sleep apnea    a. on CPAP    Past Surgical History:  Procedure Laterality Date  . ABDOMINAL HYSTERECTOMY    . TONSILLECTOMY AND ADENOIDECTOMY  1947  . tummy tuck      Social History   Socioeconomic History  . Marital status: Widowed    Spouse name: Not on file  . Number of children: 2  . Years of education: Not on file  . Highest education level: Not on file  Occupational History  . Occupation: REALTOR    Employer: [employer]  Tobacco Use  . Smoking status: Never Smoker  . Smokeless tobacco: Never Used  Vaping Use  . Vaping Use: Never used  Substance and Sexual Activity  . Alcohol use: No  . Drug use: No  . Sexual activity: Never  Other Topics Concern  . Not on file  Social History Narrative  . Not on file   Social Determinants of Health   Financial Resource Strain: Low Risk   . Difficulty of Paying Living Expenses: Not hard at all  Food Insecurity: No Food Insecurity  . Worried About Charity fundraiser in the Last Year: Never  true  . Ran Out of Food in the Last Year: Never true  Transportation Needs: No Transportation Needs  . Lack of Transportation (Medical): No  . Lack of Transportation (Non-Medical): No  Physical Activity: Not on file  Stress: No Stress Concern Present  . Feeling of Stress : Not at all  Social Connections: Unknown  . Frequency of Communication with Friends and Family: Not on file  . Frequency of Social Gatherings with Friends and Family: More than three times a week  . Attends Religious Services: Not on file  . Active Member of Clubs or Organizations: Not on file  . Attends Archivist Meetings: Not on file  . Marital Status: Not on file  Intimate Partner Violence: Not on file    Allergies  Allergen Reactions  . Augmentin  [Amoxicillin-Pot Clavulanate] Nausea And Vomiting  . Codeine Anaphylaxis    "cant remember reaction"  . Crestor [Rosuvastatin] Other (See Comments)    Muscle pain/cramps   . Penicillins     Family History  Problem Relation Age of Onset  . Stroke Mother   . Heart attack Mother   . Arthritis Mother   . Hyperlipidemia Mother   . Heart disease Mother   . Diabetes Mother   . Diabetes Sister   . Cancer Daughter   . Heart disease Daughter   . Diabetes Daughter   . Diabetes Son   . Diabetes Maternal Grandmother   . Diabetes Maternal Grandfather     Prior to Admission medications   Medication Sig Start Date End Date Taking? Authorizing Provider  ELIQUIS 5 MG TABS tablet TAKE 1 TABLET (5 MG TOTAL) BY MOUTH 2 (TWO) TIMES DAILY. 11/07/20   Minna Merritts, MD  amiodarone (PACERONE) 200 MG tablet TAKE 1 TABLET (200 MG) BY MOUTH ONCE DAILY AS DIRECTED 09/04/20   Loel Dubonnet, NP  atorvastatin (LIPITOR) 40 MG tablet TAKE 1 TABLET EVERY DAY 04/08/20   Leone Haven, MD  [employer] D3 50 MCG (2000 UT) CAPS TAKE 1 CAPSULE EVERY DAY Patient taking differently: 10,000 Units. 12/07/18   Leone Haven, MD  fluticasone [facility]) 50 MCG/ACT nasal spray USE 2 SPRAYS IN [facility] NOSTRIL EVERY DAY 04/08/20   Leone Haven, MD  furosemide (LASIX) 20 MG tablet TAKE 1 TABLET DAILY. MAY TAKE 1 EXTRA TABLET DAILY AS NEEDED FOR WEIGHT GAIN OF 2 POUNDS OVERNIGHT OR 5 POUNDS IN 1 WEEK 09/04/20   Loel Dubonnet, NP  IBU 400 MG tablet TAKE ONE TABLET BY MOUTH EVERY 8 HOURS AS NEEDED FOR MILD PAIN 01/27/18   Leone Haven, MD  montelukast (SINGULAIR) 10 MG tablet TAKE 1 TABLET EVERY DAY 04/08/20   Leone Haven, MD  vitamin E 400 UNIT capsule Take 400 Units by mouth daily.    [provider]    Physical Exam: Vitals:   12/16/20 1600 12/16/20 1615 12/16/20 1630 12/16/20 1700  BP: (!) 103/54 (!) 105/51 (!) 109/52 128/72  Pulse: 65 63 63   Resp: 11 12 11    Temp:      TempSrc:       SpO2: 99% 98% 100%   Weight:      Height:         . General:  Appears calm and comfortable and is in NAD, asking to go home . Eyes:  EOMI, normal lids, iris . ENT:  grossly normal hearing, lips & tongue, mmm . Neck:  no LAD, masses or thyromegaly . Cardiovascular:  RRR,  no m/r/g. No LE edema.  Marland Kitchen Respiratory:   CTA bilaterally with no wheezes/rales/rhonchi.  Normal respiratory effort. . Abdomen:  soft, NT, ND, NABS . Skin:  no rash or induration seen on limited exam . Musculoskeletal:  grossly normal tone BUE/BLE, good ROM, no bony abnormality . Psychiatric:  Mildly anxious and confused mood and affect, speech fluent and appropriate, repetitive questioning . Neurologic:  CN 2-12 grossly intact, moves all extremities in coordinated fashion    Radiological Exams on Admission: Independently reviewed - see discussion in A/P where applicable  DG Chest 2 View  Result Date: 12/16/2020 CLINICAL DATA:  short of breath. Emesis since yesterday, oxygen saturation 88% with temperature 100.7. EXAM: CHEST - 2 VIEW COMPARISON:  Chest radiograph 04/24/2020 FINDINGS: Unchanged cardiomediastinal silhouette. Large hiatal hernia is again seen. There is no new focal airspace disease. There is no pleural effusion or pneumothorax. There is no acute osseous abnormality. Thoracic spondylosis. IMPRESSION: No evidence of acute cardiopulmonary disease. Large hiatal hernia distended with fluid and gas. Electronically Signed   By: Maurine Simmering   On: 12/16/2020 10:10    EKG: Independently reviewed.   0919 - NSR with rate 83; RBBB; nonspecific ST changes 1013 - NSR with rate 74; RBBB; nonspecific ST changes with NSCSLT   Labs on Admission: I have personally reviewed the available labs and imaging studies at the time of the admission.  Pertinent labs:   Glucose 132 BUN 16/Creatinine 1.05/GFR 52 Normal CBC INR 1.2 Lactate 1.4 INR 1.2 COVID/flu negative [name]: small Hgb, otherwise  WNL   Assessment/Plan Principal Problem:   Sepsis due to undetermined organism [facility]) Active Problems:   Atrial fibrillation (HCC)   Hypercholesterolemia   Obstructive sleep apnea on CPAP   Memory difficulty   Essential hypertension   Chronic systolic CHF (congestive heart failure) (HCC)   Fever with possible sepsis -Patient with lethargy, fever, mild hypoxia, and overnight emesis -SIRS criteria in this patient includes: Fever, tachypnea -Patient has no current evidence of acute organ failure -While awaiting blood cultures, this may be a preseptic condition. -Sepsis protocol initiated in ER -Suspected source is gastroenteritis from food poisoning - resolved -Blood and urine cultures pending -Will place in observation status with telemetry and continue to monitor -Treated with IV Rocephin/Vanc for undifferentiated sepsis in the ER but will stop antibiotics at this time and follow -Will order procalcitonin level. -Clear liquid diet ordered with plan for advancement to bland and then regular diet as tolerated   Dementia -She is not on home medications for this other than melatonin qhs -Family counseled about how patients with dementia often do better behaviorally while hospitalized if family members are present overnight; they are willing to stay and a care order has been written  Afib -Rate controlled with Amiodarone -Continue Eliquis  HTN -She does not appear to be taking medications for this issue at this time  Chronic systolic CHF -Echo on 09/20/51 with EF 40-45%, global hypokinesis -Hold Lasix for now -Slow IVF infusion to 75 cc/hour and stop tomorrow if able to tolerate PO  HLD -Continue Lipitor  OSA -Continue CPAP  Goals of care -Patient/family have not discussed code status -Full code for now -Encouraged further home discussion after acute hospitalization    Note: This patient has been tested and is negative for the novel coronavirus COVID-19. She has been  partially vaccinated against COVID-19.    DVT prophylaxis:  Eliquis Code Status:  Full - confirmed with patient/family Family Communication: Daughter present throughout evaluation  Disposition Plan:  The patient is from: home  Anticipated d/c is to: home without [facility] services   Anticipated d/c date will depend on clinical response to treatment, but possibly as early as tomorrow if she has excellent response to treatment  Patient is currently: acutely ill Consults called: None  Admission status: It is my clinical opinion that referral for OBSERVATION is reasonable and necessary in this patient based on the above information provided. The aforementioned taken together are felt to place the patient at high risk for further clinical deterioration. However it is anticipated that the patient may be medically stable for discharge from the hospital within 24 to 48 hours.     Karmen Bongo MD [facility]   How to contact the [facility] Attending or Consulting provider [place] or covering provider during after hours [place], for this patient?  1. Check the care team in [facility] and look for a) attending/consulting TR[facility] provider listed and b) the [facility] team listed 2. Log into [email or web address] and use [place]'s universal password to access. If you do not have the password, please contact the hospital operator. 3. Locate the [facility] provider you are looking for under [facility] and page to a number that you can be directly reached. 4. If you still have difficulty reaching the provider, please page the [facility] (Director on Call) for the Hospitalists listed on amion for assistance.   12/16/2020, 6:07 PM

## 2020-12-16 NOTE — Progress Notes (Addendum)
Pharmacy Antibiotic Note  Jill Snow is a 87 y.o. female admitted on 12/16/2020 with sepsis and unknown source.  Pharmacy has been consulted for vancomycin dosing.  Having emesis, SOB, and weak/lethargic. WBC 8.3, LA 1.4, Tmax 100.1. Scr 1.05 (CrCl 39 mL/min). UA 5/2 negative leukocytes/nitrites, and WBC 0-5. Received 1 dose of ceftriaxone.   Plan: Vancomycin 1500 mg IV once then 1g IV every 24 hours (estAUC 514)  Ceftriaxone 1g IV every 24 hours  Monitor renal fx, cx results, clinical pic, and vanc levels as appropriate  Weight: 72.6 kg (160 lb)  Temp (24hrs), Avg:99.7 F (37.6 C), Min:98 F (36.7 C), Max:101.1 F (38.4 C)  Recent Labs  Lab 12/16/20 0920 12/16/20 1130  WBC 8.3  --   CREATININE 1.05*  --   LATICACIDVEN  --  1.4    Estimated Creatinine Clearance: 39.5 mL/min (A) (by C-G formula based on SCr of 1.05 mg/dL (H)).    Allergies  Allergen Reactions  . Augmentin [Amoxicillin-Pot Clavulanate] Nausea And Vomiting  . Codeine Anaphylaxis    "cant remember reaction"  . Crestor [Rosuvastatin] Other (See Comments)    Muscle pain/cramps   . Penicillins     Antimicrobials this admission: Vancomycin 5/2 >>  Ceftriaxone 5/2 >>   Dose adjustments this admission: N/A  Microbiology results: 5/2 BCx: sent 5/2 UCx: sent  5/2 COVID PCR: neg   Thank you for allowing pharmacy to be a part of this patient's care.  Antonietta Jewel, PharmD, BCCCP Clinical Pharmacist  Phone: [phone] 12/16/2020 1:43 PM  Please check AMION for all [place] phone numbers After 10:00 PM, call [place] [phone]

## 2020-12-16 NOTE — ED Notes (Signed)
Attempted to give reportx1 

## 2020-12-16 NOTE — Progress Notes (Signed)
Fall risk wristband applied bed alarm is on. Daughter is at bedside.

## 2020-12-16 NOTE — Sepsis Progress Note (Signed)
elink monitoring code sepsis.  
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
## 2020-12-17 DIAGNOSIS — K529 Noninfective gastroenteritis and colitis, unspecified: Secondary | ICD-10-CM | POA: Diagnosis not present

## 2020-12-17 LAB — BASIC METABOLIC PANEL
Anion gap: 14 (ref 5–15)
BUN: 9 mg/dL (ref 8–23)
CO2: 25 mmol/L (ref 22–32)
Calcium: 9.4 mg/dL (ref 8.9–10.3)
Chloride: 98 mmol/L (ref 98–111)
Creatinine, Ser: 0.87 mg/dL (ref 0.44–1.00)
GFR, Estimated: >60 mL/min (ref >60)
Glucose, Bld: 78 mg/dL (ref 70–99)
Potassium: 3.4 mmol/L — ABNORMAL LOW (ref 3.5–5.1)
Sodium: 137 mmol/L (ref 135–145)

## 2020-12-17 LAB — URINE CULTURE: Culture: NO GROWTH

## 2020-12-17 LAB — CBC
HCT: 45.9 % (ref 36.0–46.0)
Hemoglobin: 15.5 g/dL — ABNORMAL HIGH (ref 12.0–15.0)
MCH: 32.5 pg (ref 26.0–34.0)
MCHC: 33.8 g/dL (ref 30.0–36.0)
MCV: 96.2 fL (ref 80.0–100.0)
Platelets: 116 10*3/uL — ABNORMAL LOW (ref 150–400)
RBC: 4.77 MIL/uL (ref 3.87–5.11)
RDW: 12.4 % (ref 11.5–15.5)
WBC: 5 10*3/uL (ref 4.0–10.5)
nRBC: 0 % (ref 0.0–0.2)

## 2020-12-17 NOTE — Discharge Instructions (Signed)

## 2020-12-17 NOTE — Discharge Summary (Signed)
Physician Discharge Summary  Jill Snow [id number] DOB: 1934/02/26 DOA: 12/16/2020  PCP: Leone Haven, MD  Admit date: 12/16/2020 Discharge date: 12/17/2020    Admitted From: Home Disposition: Home  Recommendations for Outpatient Follow-up:  1. Follow up with PCP in 1-2 weeks 2. Please obtain BMP/CBC in one week 3. Please follow up with your PCP on the following pending results: Unresulted Labs (From admission, onward)          Start     Ordered   12/16/20 1028  Blood Culture (routine x 2)  (Septic presentation on arrival (screening labs, nursing and treatment orders for obvious sepsis))  BLOOD CULTURE X 2,   STAT      12/16/20 [place]: None Equipment/Devices: None  Discharge Condition: Stable CODE STATUS: Full code Diet recommendation: Cardiac  Following HPI and ED course is copied from my dear colleague Dr. Lorin Mercy HPI. HPI: Jill Snow is a 87 y.o. female with medical history significant of OSA on CPAP; afib; HTN; HLD; dementia; and chronic systolic CHF presenting with SOB, O2 sat 88% and temp 100.7.  Her daughter said she was very tired Saturday. Yesterday, they both ate [place] [employer] and he vomited and then she vomited all night.  Her daughter went to see her this AM and her O2 was down to 88%.  She was lethargic, couldn't stay awake.  She also had a fever.  Pulse was high and BP was ok but HHN recommended ER evaluation.  Her son is fine now and no one else is sick.  No urinary symptoms.  No cough - but 3 weeks ago she had HHN evaluation for lethargy and they diagnosed sinusitis and possible UTI, given antibiotic Keflex.    ED Course:  Fever, sepsis. Emesis overnight, not acting herself, lethargic, weak, SOB.  Pulse ox 88% and temp 100.7 at home.  Unremarkable exam.  Temp 101.1.  3 weeks treated with Keflex for UTI/sinus - resolved, well since.  COVID/flu negative.  UA negative.  BP borderline, verging on shock.  Feels better after  fluid.  Subjective: Seen and examined.  Daughter at the bedside.  Patient fully alert and oriented and has no complaints.  She wants to go home and daughter also is on the same page.  Brief/Interim Summary: Patient was admitted under hospitalist service for observation for possible sepsis.  She was transiently hypoxic in the ED and had fever of 101.1 at 10:18 AM yesterday.  No leukocytosis.  She was only briefly tachypneic at the same time when she had fever.  She received 1 dose of IV antibiotics however at the time of admission by hospitalist, there was no clear source of infection so antibiotics were held off.  Soon after admission, patient was taken off of oxygen, she did not have any further fever after that except 100.1 at around 1 PM yesterday.  Chest x-ray and UA was unremarkable.  No nausea or vomiting or any other symptoms.  When seen today, she is completely symptom-free and daughter is at the bedside.  They want to go home.  Since patient is completely symptom-free she is being discharged home however I had a very lengthy discussion with the daughter that patient possibly may have had aspirated her own vomiting resulting in aspiration pneumonia however oftentimes, chest x-ray findings lag behind the clinical findings.  Her lungs are clear to auscultation and there are no signs of aspiration pneumonia  at this point in time however she was instructed to keep a close eye on patient's fever or shortness of breath or cough and reach out to patient's PCP or seek medical attention if she were to develop any of those.  She verbalized understanding.  Since there is no source of infection, I do not think patient needs sepsis criteria, sepsis ruled out.  Procalcitonin unremarkable no antibiotics are indicated either.  Discharge Diagnoses:  Active Problems:   Atrial fibrillation (HCC)   Hypercholesterolemia   Obstructive sleep apnea on CPAP   Memory difficulty   Essential hypertension   Chronic systolic  CHF (congestive heart failure) (HCC)   Gastroenteritis    Discharge Instructions   Allergies as of 12/17/2020      Reactions   Augmentin [amoxicillin-pot Clavulanate] Nausea And Vomiting   Codeine Anaphylaxis   "cant remember reaction"   Crestor [rosuvastatin] Other (See Comments)   Muscle pain/cramps   Penicillins       Medication List    TAKE these medications   amiodarone 200 MG tablet Commonly known as: PACERONE TAKE 1 TABLET (200 MG) BY MOUTH ONCE DAILY AS DIRECTED What changed: See the new instructions.   atorvastatin 40 MG tablet Commonly known as: LIPITOR TAKE 1 TABLET EVERY DAY   [employer] D3 50 MCG (2000 UT) Caps Generic drug: Cholecalciferol TAKE 1 CAPSULE EVERY DAY What changed:   how much to take  when to take this   Eliquis 5 MG Tabs tablet Generic drug: apixaban TAKE 1 TABLET (5 MG TOTAL) BY MOUTH 2 (TWO) TIMES DAILY. What changed: how much to take   fluticasone 50 MCG/ACT nasal spray Commonly known as: FLONASE USE 2 SPRAYS IN EACH NOSTRIL EVERY DAY   furosemide 20 MG tablet Commonly known as: LASIX TAKE 1 TABLET DAILY. MAY TAKE 1 EXTRA TABLET DAILY AS NEEDED FOR WEIGHT GAIN OF 2 POUNDS OVERNIGHT OR 5 POUNDS IN 1 WEEK What changed:   how much to take  how to take this  when to take this   IBU 400 MG tablet Generic drug: ibuprofen TAKE ONE TABLET BY MOUTH EVERY 8 HOURS AS NEEDED FOR MILD PAIN What changed: See the new instructions.   melatonin 5 MG Tabs Take 5 mg by mouth at bedtime.   montelukast 10 MG tablet Commonly known as: SINGULAIR TAKE 1 TABLET EVERY DAY   vitamin E 180 MG (400 UNITS) capsule Take 400 Units by mouth daily.       Follow-up Information    Leone Haven, MD Follow up in 1 week(s).   Specialty: Family Medicine Contact information: [address] [place] [address] [phone]        Minna Merritts, MD .   Specialty: Cardiology Contact information: [address] [place] [address] [phone]              Allergies  Allergen Reactions  . Augmentin [Amoxicillin-Pot Clavulanate] Nausea And Vomiting  . Codeine Anaphylaxis    "cant remember reaction"  . Crestor [Rosuvastatin] Other (See Comments)    Muscle pain/cramps   . Penicillins     Consultations: None   Procedures/Studies: DG Chest 2 View  Result Date: 12/16/2020 CLINICAL DATA:  short of breath. Emesis since yesterday, oxygen saturation 88% with temperature 100.7. EXAM: CHEST - 2 VIEW COMPARISON:  Chest radiograph 04/24/2020 FINDINGS: Unchanged cardiomediastinal silhouette. Large hiatal hernia is again seen. There is no new focal airspace disease. There is no pleural effusion or  pneumothorax. There is no acute osseous abnormality. Thoracic spondylosis. IMPRESSION: No evidence of acute cardiopulmonary disease. Large hiatal hernia distended with fluid and gas. Electronically Signed   By: Maurine Simmering   On: 12/16/2020 10:10      Discharge Exam: Vitals:   12/16/20 2304 12/17/20 0355  BP: 129/69 128/66  Pulse: 70 73  Resp: 18 20  Temp: 97.8 F (36.6 C) 97.7 F (36.5 C)  SpO2: 100% 97%   Vitals:   12/16/20 1800 12/16/20 1916 12/16/20 2304 12/17/20 0355  BP: (!) 126/54 (!) 109/40 129/69 128/66  Pulse: 72 72 70 73  Resp: 10 20 18 20   Temp: 98.6 F (37 C) 98.3 F (36.8 C) 97.8 F (36.6 C) 97.7 F (36.5 C)  TempSrc: Oral Oral Oral Oral  SpO2: 100% 98% 100% 97%  Weight:      Height:        General: Pt is alert, awake, not in acute distress Cardiovascular: RRR, S1/S2 +, no rubs, no gallops Respiratory: CTA bilaterally, no wheezing, no rhonchi Abdominal: Soft, NT, ND, bowel sounds + Extremities: no edema, no cyanosis    The results of significant diagnostics from this hospitalization (including imaging, microbiology, ancillary and laboratory) are listed below for reference.     Microbiology: Recent Results (from the past 240 hour(s))  Resp Panel by RT-PCR  (Flu A&B, Covid) Nasopharyngeal Swab     Status: None   Collection Time: 12/16/20 11:18 AM   Specimen: Nasopharyngeal Swab; Nasopharyngeal(NP) swabs in vial transport medium  Result Value Ref Range Status   SARS Coronavirus 2 by RT PCR NEGATIVE NEGATIVE Final    Comment: (NOTE) SARS-CoV-2 target nucleic acids are NOT DETECTED.  The SARS-CoV-2 RNA is generally detectable in upper respiratory specimens during the acute phase of infection. The lowest concentration of SARS-CoV-2 viral copies this assay can detect is 138 copies/mL. A negative result does not preclude SARS-Cov-2 infection and should not be used as the sole basis for treatment or other patient management decisions. A negative result may occur with  improper specimen collection/handling, submission of specimen other than nasopharyngeal swab, presence of viral mutation(s) within the areas targeted by this assay, and inadequate number of viral copies(<138 copies/mL). A negative result must be combined with clinical observations, patient history, and epidemiological information. The expected result is Negative.  Fact Sheet for Patients:  EntrepreneurPulse.com.au  Fact Sheet for Healthcare Providers:  IncredibleEmployment.be  This test is no t yet approved or cleared by the [place] FDA and  has been authorized for detection and/or diagnosis of SARS-CoV-2 by FDA under an Emergency Use Authorization (EUA). This EUA will remain  in effect (meaning this test can be used) for the duration of the COVID-19 declaration under Section 564(b)(1) of the Act, 21 U.S.C.section 360bbb-3(b)(1), unless the authorization is terminated  or revoked sooner.       Influenza A by PCR NEGATIVE NEGATIVE Final   Influenza B by PCR NEGATIVE NEGATIVE Final    Comment: (NOTE) The Xpert Xpress SARS-CoV-2/FLU/RSV plus assay is intended as an aid in the diagnosis of influenza from Nasopharyngeal swab specimens  and should not be used as a sole basis for treatment. Nasal washings and aspirates are unacceptable for Xpert Xpress SARS-CoV-2/FLU/RSV testing.  Fact Sheet for Patients: EntrepreneurPulse.com.au  Fact Sheet for Healthcare Providers: IncredibleEmployment.be  This test is not yet approved or cleared by the [place] FDA and has been authorized for detection and/or diagnosis of SARS-CoV-2 by FDA under an Emergency Use Authorization (EUA).  This EUA will remain in effect (meaning this test can be used) for the duration of the COVID-19 declaration under Section 564(b)(1) of the Act, 21 U.S.C. section 360bbb-3(b)(1), unless the authorization is terminated or revoked.  Performed at [facility], [place] [address], [place], [place] [address]   Urine culture     Status: None   Collection Time: 12/16/20 12:41 PM   Specimen: In/Out Cath Urine  Result Value Ref Range Status   Specimen Description IN/OUT CATH URINE  Final   Special Requests NONE  Final   Culture   Final    NO GROWTH Performed at [facility], [place] [address] [place],  [address]    Report Status 12/17/2020 FINAL  Final     Labs: BNP (last 3 results) Recent Labs    04/23/20 1322  BNP 123456*   Basic Metabolic Panel: Recent Labs  Lab 12/16/20 0920 12/17/20 0455  NA 136 137  K 3.7 3.4*  CL 101 98  CO2 25 25  GLUCOSE 132* 78  BUN 16 9  CREATININE 1.05* 0.87  CALCIUM 9.3 9.4   Liver Function Tests: Recent Labs  Lab 12/16/20 0920  AST 32  ALT 27  ALKPHOS 63  BILITOT 1.9*  PROT 7.2  ALBUMIN 3.9   Recent Labs  Lab 12/16/20 0920  LIPASE 36   No results for input(s): AMMONIA in the last 168 hours. CBC: Recent Labs  Lab 12/16/20 0920 12/17/20 0455  WBC 8.3 5.0  HGB 14.4 15.5*  HCT 43.5 45.9  MCV 98.4 96.2  PLT 177 116*   Cardiac Enzymes: No results for input(s): CKTOTAL, CKMB, CKMBINDEX, TROPONINI in the last 168 hours. BNP: Invalid  input(s): POCBNP CBG: No results for input(s): GLUCAP in the last 168 hours. D-Dimer No results for input(s): DDIMER in the last 72 hours. Hgb A1c No results for input(s): HGBA1C in the last 72 hours. Lipid Profile No results for input(s): CHOL, HDL, LDLCALC, TRIG, CHOLHDL, LDLDIRECT in the last 72 hours. Thyroid function studies No results for input(s): TSH, T4TOTAL, T3FREE, THYROIDAB in the last 72 hours.  Invalid input(s): FREET3 Anemia work up No results for input(s): VITAMINB12, FOLATE, FERRITIN, TIBC, IRON, RETICCTPCT in the last 72 hours. Urinalysis    Component Value Date/Time   COLORURINE YELLOW 12/16/2020 1303   APPEARANCEUR CLEAR 12/16/2020 1303   APPEARANCEUR Hazy 05/05/2012 0014   LABSPEC 1.021 12/16/2020 1303   LABSPEC 1.017 05/05/2012 0014   PHURINE 5.0 12/16/2020 1303   GLUCOSEU NEGATIVE 12/16/2020 1303   GLUCOSEU Negative 05/05/2012 0014   HGBUR SMALL (A) 12/16/2020 1303   BILIRUBINUR NEGATIVE 12/16/2020 1303   BILIRUBINUR small 01/30/2020 1159   BILIRUBINUR Negative 05/05/2012 0014   KETONESUR NEGATIVE 12/16/2020 1303   PROTEINUR NEGATIVE 12/16/2020 1303   UROBILINOGEN 0.2 01/30/2020 1159   NITRITE NEGATIVE 12/16/2020 1303   LEUKOCYTESUR NEGATIVE 12/16/2020 1303   LEUKOCYTESUR 3+ 05/05/2012 0014   Sepsis Labs Invalid input(s): PROCALCITONIN,  WBC,  LACTICIDVEN Microbiology Recent Results (from the past 240 hour(s))  Resp Panel by RT-PCR (Flu A&B, Covid) Nasopharyngeal Swab     Status: None   Collection Time: 12/16/20 11:18 AM   Specimen: Nasopharyngeal Swab; Nasopharyngeal(NP) swabs in vial transport medium  Result Value Ref Range Status   SARS Coronavirus 2 by RT PCR NEGATIVE NEGATIVE Final    Comment: (NOTE) SARS-CoV-2 target nucleic acids are NOT DETECTED.  The SARS-CoV-2 RNA is generally detectable in upper respiratory specimens during the acute phase of infection. The  lowest concentration of SARS-CoV-2 viral copies this assay can detect  is 138 copies/mL. A negative result does not preclude SARS-Cov-2 infection and should not be used as the sole basis for treatment or other patient management decisions. A negative result may occur with  improper specimen collection/handling, submission of specimen other than nasopharyngeal swab, presence of viral mutation(s) within the areas targeted by this assay, and inadequate number of viral copies(<138 copies/mL). A negative result must be combined with clinical observations, patient history, and epidemiological information. The expected result is Negative.  Fact Sheet for Patients:  EntrepreneurPulse.com.au  Fact Sheet for Healthcare Providers:  IncredibleEmployment.be  This test is no t yet approved or cleared by the [place] FDA and  has been authorized for detection and/or diagnosis of SARS-CoV-2 by FDA under an Emergency Use Authorization (EUA). This EUA will remain  in effect (meaning this test can be used) for the duration of the COVID-19 declaration under Section 564(b)(1) of the Act, 21 U.S.C.section 360bbb-3(b)(1), unless the authorization is terminated  or revoked sooner.       Influenza A by PCR NEGATIVE NEGATIVE Final   Influenza B by PCR NEGATIVE NEGATIVE Final    Comment: (NOTE) The Xpert Xpress SARS-CoV-2/FLU/RSV plus assay is intended as an aid in the diagnosis of influenza from Nasopharyngeal swab specimens and should not be used as a sole basis for treatment. Nasal washings and aspirates are unacceptable for Xpert Xpress SARS-CoV-2/FLU/RSV testing.  Fact Sheet for Patients: EntrepreneurPulse.com.au  Fact Sheet for Healthcare Providers: IncredibleEmployment.be  This test is not yet approved or cleared by the [place] FDA and has been authorized for detection and/or diagnosis of SARS-CoV-2 by FDA under an Emergency Use Authorization (EUA). This EUA will remain in effect  (meaning this test can be used) for the duration of the COVID-19 declaration under Section 564(b)(1) of the Act, 21 U.S.C. section 360bbb-3(b)(1), unless the authorization is terminated or revoked.  Performed at [facility], [place] [address] [place], [place] [address]   Urine culture     Status: None   Collection Time: 12/16/20 12:41 PM   Specimen: In/Out Cath Urine  Result Value Ref Range Status   Specimen Description IN/OUT CATH URINE  Final   Special Requests NONE  Final   Culture   Final    NO GROWTH Performed at [facility], [place] [address] [place], [place] [address]    Report Status 12/17/2020 FINAL  Final     Time coordinating discharge: Over 30 minutes  SIGNED:   Darliss Cheney, MD  [facility] 12/17/2020, 10:08 AM  If 7PM-7AM, please contact night-coverage [email or web address]

## 2020-12-17 NOTE — Progress Notes (Signed)
Pt not placed on cpap, pt refused, and daughter states she does not use one.

## 2020-12-19 ENCOUNTER — Telehealth: Payer: Self-pay

## 2020-12-19 NOTE — Telephone Encounter (Signed)
Transition Care Management Unsuccessful Follow-up Telephone Call  Date of discharge and from where:  12/19/20 from [facility]  Attempts:  1st Attempt  Reason for unsuccessful TCM follow-up call:  Unable to reach patient. Will follow.

## 2020-12-20 NOTE — Telephone Encounter (Signed)
Transition Care Management Unsuccessful Follow-up Telephone Call  Date of discharge and from where:  12/19/20 from [facility]  Attempts:  2nd Attempt  Reason for unsuccessful TCM follow-up call:  Left voice message.  HFU scheduled 12/24/20 @ 11:30.

## 2020-12-21 LAB — CULTURE, BLOOD (ROUTINE X 2)
Culture: NO GROWTH
Culture: NO GROWTH

## 2020-12-23 ENCOUNTER — Telehealth: Payer: Self-pay

## 2020-12-23 DIAGNOSIS — G301 Alzheimer's disease with late onset: Secondary | ICD-10-CM | POA: Diagnosis not present

## 2020-12-23 DIAGNOSIS — F0281 Dementia in other diseases classified elsewhere with behavioral disturbance: Secondary | ICD-10-CM | POA: Diagnosis not present

## 2020-12-23 NOTE — Telephone Encounter (Signed)
-----   Message from Cameron Sprang, MD sent at 12/23/2020 12:51 PM EDT ----- Regarding: CT head results Pls let daughter know the head CT did not show any evidence of tumor, stroke, or bleed. It showed age-related changes with brain getting smaller, which is seen with dementia. Thanks   ----- Message ----- From: Virl Son Sent: 12/23/2020  12:10 PM EDT To: Cameron Sprang, MD

## 2020-12-23 NOTE — Telephone Encounter (Signed)
Transition Care Management Follow-up Telephone Call  Date of discharge and from where: 12/17/20 from [facility].  How have you been since you were released from the hospital? Information received from daughter (HIPAA compliant). Baseline as of Thursday. Denies fever, emesis, diarrhea, shortness of breath, nausea, weakness, pain, fatigue. Monitoring vitals; all within normal limits today temp 97.3, 67P, oxygen room air 97%, BP 137/61.  Any questions or concerns? Seen by Neurology today, awaiting brain scan results. When going to ED for a long wait period can a note be placed in the chart to give medication to assist with anxiety?   Items Reviewed:  Did the pt receive and understand the discharge instructions provided? Yes   Medications obtained and verified? Yes   Any new allergies since your discharge? No   Dietary orders reviewed? Mediterranean diet  Do you have support at home? Yes   Home Care and Equipment/Supplies: Were home health services ordered? No  Functional Questionnaire: (I = Independent and D = Dependent) ADLs: I  Bathing/Dressing- I  Meal Prep- D, family assist  Eating- I  Maintaining continence- I  Transferring/Ambulation- I  Managing Meds- daughter manages  Follow up appointments reviewed:   PCP Hospital f/u appt confirmed? Yes  Scheduled to see PCP on 12/24/20 @ 11:30.  [facility] f/u appt confirmed? Yes    Are transportation arrangements needed? No   If their condition worsens, is the pt aware to call PCP or go to the Emergency Dept.? Yes  Was the patient provided with contact information for the PCP's office or ED? Yes  Was to pt encouraged to call back with questions or concerns? Yes

## 2020-12-23 NOTE — Telephone Encounter (Signed)
Spoke with pt daughter informed her that  the head CT did not show any evidence of tumor, stroke, or bleed. It showed age-related changes with brain getting smaller, which is seen with dementia

## 2020-12-24 ENCOUNTER — Encounter: Payer: Self-pay | Admitting: Family Medicine

## 2020-12-24 ENCOUNTER — Other Ambulatory Visit: Payer: Self-pay

## 2020-12-24 ENCOUNTER — Ambulatory Visit (INDEPENDENT_AMBULATORY_CARE_PROVIDER_SITE_OTHER): Payer: Medicare HMO | Admitting: Family Medicine

## 2020-12-24 DIAGNOSIS — I5022 Chronic systolic (congestive) heart failure: Secondary | ICD-10-CM

## 2020-12-24 DIAGNOSIS — H04123 Dry eye syndrome of bilateral lacrimal glands: Secondary | ICD-10-CM | POA: Diagnosis not present

## 2020-12-24 DIAGNOSIS — R509 Fever, unspecified: Secondary | ICD-10-CM | POA: Insufficient documentation

## 2020-12-24 DIAGNOSIS — I480 Paroxysmal atrial fibrillation: Secondary | ICD-10-CM

## 2020-12-24 LAB — BASIC METABOLIC PANEL
BUN: 13 mg/dL (ref 6–23)
CO2: 30 mEq/L (ref 19–32)
Calcium: 9.2 mg/dL (ref 8.4–10.5)
Chloride: 100 mEq/L (ref 96–112)
Creatinine, Ser: 0.87 mg/dL (ref 0.40–1.20)
GFR: 60.89 mL/min (ref >60.00)
Glucose, Bld: 115 mg/dL — ABNORMAL HIGH (ref 70–99)
Potassium: 3 mEq/L — ABNORMAL LOW (ref 3.5–5.1)
Sodium: 138 mEq/L (ref 135–145)

## 2020-12-24 LAB — CBC
HCT: 38.4 % (ref 36.0–46.0)
Hemoglobin: 13.4 g/dL (ref 12.0–15.0)
MCHC: 34.9 g/dL (ref 30.0–36.0)
MCV: 93.6 fl (ref 78.0–100.0)
Platelets: 234 10*3/uL (ref 150.0–400.0)
RBC: 4.1 Mil/uL (ref 3.87–5.11)
RDW: 12.7 % (ref 11.5–15.5)
WBC: 5.5 10*3/uL (ref 4.0–10.5)

## 2020-12-24 NOTE — Patient Instructions (Signed)
Nice to see you. If you have recurrence of any symptoms please let us know. Please see the eye doctor as planned. We will get lab work today and contact you with results.

## 2020-12-24 NOTE — Telephone Encounter (Signed)
Reviewed

## 2020-12-24 NOTE — Assessment & Plan Note (Signed)
She will see ophthalmology as planned.  I encouraged them to use moisturizing eyedrops.

## 2020-12-24 NOTE — Assessment & Plan Note (Signed)
Appears euvolemic.  Reduced EF was likely in the setting of her A. fib with RVR.  I will send a message to cardiology to see if she needs a follow-up echo.

## 2020-12-24 NOTE — Assessment & Plan Note (Signed)
Unknown source.  She possibly had a GI illness with subsequent mild aspiration leading to the shortness of breath and oxygen desaturation.  Her symptoms have completely resolved.  She will monitor for recurrence.

## 2020-12-24 NOTE — Assessment & Plan Note (Signed)
Sinus rhythm today.  She will continue Eliquis 5 mg twice daily.  Amiodarone is managed by her cardiologist.

## 2020-12-24 NOTE — Progress Notes (Signed)
Jill Rumps, MD Phone: [phone]  Jill Snow is a 87 y.o. female who presents today for f/u.  Vomiting/fever/shortness of breath: Patient was evaluated in the hospital for this.  Her daughter notes the patient had been fatigued for a couple of days and then started to vomit and then developed shortness of breath with oxygen desaturation to 88% on room air.  She was evaluated in the hospital.  Her COVID and flu test were negative.  Chest x-ray was unremarkable for a cause of her symptoms. She received 1 dose of antibiotics and subsequently this was discontinued as it was felt she did not have sepsis.  She began to improve on her own and has had no recurrence of symptoms since discharge.  She has had no vomiting or diarrhea since discharge.  No shortness of breath, orthopnea, or PND.  A. fib: Continues on Eliquis and amiodarone.  Her daughter questions a diagnosis of heart failure.  It appears she had a reduced EF in the setting of A. fib with RVR last year.  She has followed up with cardiology with no additional intervention planned given relative hypotension.  Dry eyes: Patient notes a dry sandy sensation in her eyes that have been bothering her for some time.  She notes no vision changes.  They have an appointment to see ophthalmology next week.  Social History   Tobacco Use  Smoking Status Never Smoker  Smokeless Tobacco Never Used    Current Outpatient Medications on File Prior to Visit  Medication Sig Dispense Refill  . amiodarone (PACERONE) 200 MG tablet TAKE 1 TABLET (200 MG) BY MOUTH ONCE DAILY AS DIRECTED (Patient taking differently: Take 200 mg by mouth daily.) 90 tablet 1  . atorvastatin (LIPITOR) 40 MG tablet TAKE 1 TABLET EVERY DAY (Patient taking differently: Take 40 mg by mouth daily.) 90 tablet 1  . cephALEXin (KEFLEX) 500 MG capsule Take 500 mg by mouth every 12 (twelve) hours.    . [employer]S D3 50 MCG (2000 UT) CAPS TAKE 1 CAPSULE EVERY DAY (Patient taking  differently: Take 10,000 Units by mouth at bedtime.) 100 capsule 3  . ELIQUIS 5 MG TABS tablet TAKE 1 TABLET (5 MG TOTAL) BY MOUTH 2 (TWO) TIMES DAILY. (Patient taking differently: Take 5 mg by mouth 2 (two) times daily.) 180 tablet 1  . fluticasone (FLONASE) 50 MCG/ACT nasal spray USE 2 SPRAYS IN EACH NOSTRIL EVERY DAY 48 g 1  . furosemide (LASIX) 20 MG tablet TAKE 1 TABLET DAILY. MAY TAKE 1 EXTRA TABLET DAILY AS NEEDED FOR WEIGHT GAIN OF 2 POUNDS OVERNIGHT OR 5 POUNDS IN 1 WEEK (Patient taking differently: Take 20 mg by mouth See admin instructions. TAKE 1 TABLET DAILY. MAY TAKE 1 EXTRA TABLET DAILY AS NEEDED FOR WEIGHT GAIN OF 2 POUNDS OVERNIGHT OR 5 POUNDS IN 1 WEEK) 90 tablet 1  . IBU 400 MG tablet TAKE ONE TABLET BY MOUTH EVERY 8 HOURS AS NEEDED FOR MILD PAIN (Patient taking differently: Take 400 mg by mouth every 8 (eight) hours as needed for mild pain.) 30 tablet 0  . melatonin 5 MG TABS Take 5 mg by mouth at bedtime.    . montelukast (SINGULAIR) 10 MG tablet TAKE 1 TABLET EVERY DAY (Patient taking differently: Take 10 mg by mouth daily.) 90 tablet 1  . vitamin E 400 UNIT capsule Take 400 Units by mouth daily.     No current facility-administered medications on file prior to visit.     ROS see history of  present illness  Objective  Physical Exam Vitals:   12/24/20 1126  BP: 100/60  Pulse: 62  Temp: 98.4 F (36.9 C)  SpO2: 99%    BP Readings from Last 3 Encounters:  12/24/20 100/60  12/17/20 128/66  11/04/20 (!) 145/86   Wt Readings from Last 3 Encounters:  12/24/20 155 lb (70.3 kg)  12/16/20 160 lb (72.6 kg)  11/04/20 159 lb (72.1 kg)    Physical Exam Constitutional:      General: She is not in acute distress.    Appearance: She is not diaphoretic.  Cardiovascular:     Rate and Rhythm: Normal rate and regular rhythm.     Heart sounds: Normal heart sounds.  Pulmonary:     Effort: Pulmonary effort is normal.     Breath sounds: Normal breath sounds.   Musculoskeletal:     Right lower leg: No edema.     Left lower leg: No edema.  Skin:    General: Skin is warm and dry.  Neurological:     Mental Status: She is alert.      Assessment/Plan: Please see individual problem list.  Problem List Items Addressed This Visit    Chronic systolic CHF (congestive heart failure) (HCC) (Chronic)    Appears euvolemic.  Reduced EF was likely in the setting of her A. fib with RVR.  I will send a message to cardiology to see if she needs a follow-up echo.      Atrial fibrillation (HCC)    Sinus rhythm today.  She will continue Eliquis 5 mg twice daily.  Amiodarone is managed by her cardiologist.      Fever    Unknown source.  She possibly had a GI illness with subsequent mild aspiration leading to the shortness of breath and oxygen desaturation.  Her symptoms have completely resolved.  She will monitor for recurrence.      Relevant Orders   Basic Metabolic Panel (BMET)   CBC   Dry eyes    She will see ophthalmology as planned.  I encouraged them to use moisturizing eyedrops.         Return in about 3 months (around 03/26/2021).  This visit occurred during the SARS-CoV-2 public health emergency.  Safety protocols were in place, including screening questions prior to the visit, additional usage of staff PPE, and extensive cleaning of exam room while observing appropriate contact time as indicated for disinfecting solutions.    Jill Rumps, MD [place]

## 2020-12-25 ENCOUNTER — Encounter: Payer: Self-pay | Admitting: Family Medicine

## 2020-12-25 ENCOUNTER — Other Ambulatory Visit: Payer: Self-pay | Admitting: Family Medicine

## 2020-12-25 DIAGNOSIS — E876 Hypokalemia: Secondary | ICD-10-CM

## 2020-12-25 MED ORDER — POTASSIUM CHLORIDE CRYS ER 20 MEQ PO TBCR: 40.0000 meq | EXTENDED_RELEASE_TABLET | Freq: Every day | ORAL | 0 refills | Status: DC

## 2020-12-27 ENCOUNTER — Telehealth: Payer: Self-pay | Admitting: Family Medicine

## 2020-12-27 NOTE — Telephone Encounter (Signed)
PT daughter called in wanting to get some clarification on how to give her moms potassium chloride SA (KLOR-CON) 20 MEQ tablet to take.

## 2020-12-27 NOTE — Telephone Encounter (Signed)
I called the patient and explained to her how to take the potassium and she understood.  Jill Snow,cma

## 2020-12-30 ENCOUNTER — Other Ambulatory Visit (INDEPENDENT_AMBULATORY_CARE_PROVIDER_SITE_OTHER): Payer: Medicare HMO

## 2020-12-30 ENCOUNTER — Other Ambulatory Visit: Payer: Self-pay

## 2020-12-30 DIAGNOSIS — E876 Hypokalemia: Secondary | ICD-10-CM

## 2020-12-30 DIAGNOSIS — Z961 Presence of intraocular lens: Secondary | ICD-10-CM | POA: Diagnosis not present

## 2020-12-30 NOTE — Telephone Encounter (Signed)
Can you reach out to the patients daughter and see if the brother's school has a specific form that needs to be filled out for this? I typically do not provide notes to have on hand, though can typically complete a note for each individual episode of caring for the patient.

## 2020-12-31 ENCOUNTER — Other Ambulatory Visit: Payer: Medicare HMO

## 2020-12-31 ENCOUNTER — Telehealth: Payer: Self-pay

## 2020-12-31 LAB — BASIC METABOLIC PANEL
BUN: 16 mg/dL (ref 6–23)
CO2: 31 mEq/L (ref 19–32)
Calcium: 9.1 mg/dL (ref 8.4–10.5)
Chloride: 100 mEq/L (ref 96–112)
Creatinine, Ser: 0.98 mg/dL (ref 0.40–1.20)
GFR: 52.77 mL/min — ABNORMAL LOW (ref >60.00)
Glucose, Bld: 71 mg/dL (ref 70–99)
Potassium: 3.6 mEq/L (ref 3.5–5.1)
Sodium: 138 mEq/L (ref 135–145)

## 2020-12-31 NOTE — Telephone Encounter (Signed)
Called the patient's daughter to see if the brother has a form from the school that the provider can fill out for him caring for the patient, the daughter stated she would ask him to check on that and I informed her to send the form through mychart for the provider to sign and she understood. Micaela Stith,cma

## 2021-01-03 DIAGNOSIS — J302 Other seasonal allergic rhinitis: Secondary | ICD-10-CM | POA: Diagnosis not present

## 2021-01-14 ENCOUNTER — Other Ambulatory Visit: Payer: Self-pay | Admitting: Family Medicine

## 2021-01-14 DIAGNOSIS — E876 Hypokalemia: Secondary | ICD-10-CM

## 2021-01-14 MED ORDER — POTASSIUM CHLORIDE CRYS ER 20 MEQ PO TBCR: 20.0000 meq | EXTENDED_RELEASE_TABLET | Freq: Every day | ORAL | 2 refills | Status: DC

## 2021-01-20 DIAGNOSIS — F0390 Unspecified dementia without behavioral disturbance: Secondary | ICD-10-CM | POA: Diagnosis not present

## 2021-01-21 ENCOUNTER — Other Ambulatory Visit: Payer: Self-pay

## 2021-01-21 DIAGNOSIS — I428 Other cardiomyopathies: Secondary | ICD-10-CM

## 2021-01-21 DIAGNOSIS — I480 Paroxysmal atrial fibrillation: Secondary | ICD-10-CM

## 2021-01-21 NOTE — Progress Notes (Signed)
Per Dr. Rockey Situ repeat echo is needed after discussion with [name], MD with [facility] .  Order has been placed, will route to scheduling to reach out to pt to have appt made.

## 2021-01-29 ENCOUNTER — Other Ambulatory Visit: Payer: Self-pay | Admitting: Family Medicine

## 2021-01-29 DIAGNOSIS — J309 Allergic rhinitis, unspecified: Secondary | ICD-10-CM

## 2021-02-03 ENCOUNTER — Other Ambulatory Visit (INDEPENDENT_AMBULATORY_CARE_PROVIDER_SITE_OTHER): Payer: Medicare HMO

## 2021-02-03 ENCOUNTER — Other Ambulatory Visit: Payer: Self-pay

## 2021-02-03 DIAGNOSIS — E876 Hypokalemia: Secondary | ICD-10-CM

## 2021-02-04 LAB — BASIC METABOLIC PANEL
BUN: 13 mg/dL (ref 6–23)
CO2: 28 mEq/L (ref 19–32)
Calcium: 9.3 mg/dL (ref 8.4–10.5)
Chloride: 103 mEq/L (ref 96–112)
Creatinine, Ser: 0.98 mg/dL (ref 0.40–1.20)
GFR: 52.74 mL/min — ABNORMAL LOW (ref >60.00)
Glucose, Bld: 98 mg/dL (ref 70–99)
Potassium: 3.8 mEq/L (ref 3.5–5.1)
Sodium: 139 mEq/L (ref 135–145)

## 2021-02-10 ENCOUNTER — Telehealth: Payer: Self-pay | Admitting: Family Medicine

## 2021-02-10 DIAGNOSIS — Z7901 Long term (current) use of anticoagulants: Secondary | ICD-10-CM | POA: Diagnosis not present

## 2021-02-10 DIAGNOSIS — I2510 Atherosclerotic heart disease of native coronary artery without angina pectoris: Secondary | ICD-10-CM | POA: Diagnosis not present

## 2021-02-10 DIAGNOSIS — Z8679 Personal history of other diseases of the circulatory system: Secondary | ICD-10-CM | POA: Diagnosis not present

## 2021-02-10 DIAGNOSIS — F0390 Unspecified dementia without behavioral disturbance: Secondary | ICD-10-CM | POA: Diagnosis not present

## 2021-02-10 NOTE — Telephone Encounter (Signed)
Patient daughter called back in and was informed

## 2021-02-10 NOTE — Progress Notes (Signed)
Left message to return call 

## 2021-02-10 NOTE — Telephone Encounter (Signed)
Left message to return call 

## 2021-02-10 NOTE — Telephone Encounter (Signed)
Pt daughter called to get lab results

## 2021-02-12 ENCOUNTER — Encounter: Payer: Self-pay | Admitting: Family Medicine

## 2021-02-12 MED ORDER — POTASSIUM CHLORIDE 20 MEQ/15ML (10%) PO SOLN: 20.0000 meq | Freq: Every day | ORAL | 2 refills | Status: DC

## 2021-03-10 ENCOUNTER — Other Ambulatory Visit: Payer: Self-pay

## 2021-03-10 ENCOUNTER — Ambulatory Visit (INDEPENDENT_AMBULATORY_CARE_PROVIDER_SITE_OTHER): Payer: Medicare HMO

## 2021-03-10 DIAGNOSIS — I480 Paroxysmal atrial fibrillation: Secondary | ICD-10-CM | POA: Diagnosis not present

## 2021-03-10 DIAGNOSIS — I428 Other cardiomyopathies: Secondary | ICD-10-CM | POA: Diagnosis not present

## 2021-03-10 LAB — ECHOCARDIOGRAM COMPLETE
Area-P 1/2: 2.83 cm2
Calc EF: 61.2 %
S' Lateral: 3 cm
Single Plane A2C EF: 59.1 %
Single Plane A4C EF: 63.1 %

## 2021-03-21 DIAGNOSIS — L03114 Cellulitis of left upper limb: Secondary | ICD-10-CM | POA: Diagnosis not present

## 2021-03-25 ENCOUNTER — Telehealth: Payer: Self-pay

## 2021-03-25 NOTE — Telephone Encounter (Signed)
Left detail message on VM of pt's daughter Janace Hoard of [employer] recent results okay by DPR, Dr. Rockey Situ advised  "echo  improvement in cardiac function, now normal"  At this time, no further recommendations or medications changes, advised to call office for any concerns or questions, otherwise will see at next visit.

## 2021-03-31 ENCOUNTER — Ambulatory Visit: Payer: Medicare HMO | Admitting: Family Medicine

## 2021-04-04 DIAGNOSIS — Z09 Encounter for follow-up examination after completed treatment for conditions other than malignant neoplasm: Secondary | ICD-10-CM | POA: Diagnosis not present

## 2021-04-04 DIAGNOSIS — Z872 Personal history of diseases of the skin and subcutaneous tissue: Secondary | ICD-10-CM | POA: Diagnosis not present

## 2021-04-10 ENCOUNTER — Other Ambulatory Visit: Payer: Self-pay | Admitting: Family Medicine

## 2021-04-10 DIAGNOSIS — E876 Hypokalemia: Secondary | ICD-10-CM

## 2021-04-10 NOTE — Telephone Encounter (Signed)
She should be using the liquid formulation of the potassium. She should already have refills of that available.

## 2021-04-10 NOTE — Telephone Encounter (Signed)
Looks like medication has been discontinued.

## 2021-05-02 ENCOUNTER — Other Ambulatory Visit: Payer: Self-pay | Admitting: Family Medicine

## 2021-05-02 ENCOUNTER — Other Ambulatory Visit: Payer: Self-pay | Admitting: Family

## 2021-05-02 NOTE — Telephone Encounter (Signed)
Rx(s) sent to pharmacy electronically.  

## 2021-05-05 ENCOUNTER — Telehealth: Payer: Self-pay | Admitting: Family Medicine

## 2021-05-05 NOTE — Telephone Encounter (Signed)
This was a miscommunication and the daughter stated it was handled .  Haden Suder,cma

## 2021-05-05 NOTE — Telephone Encounter (Signed)
Caren Griffins from [facility] [employer] is calling with the patients daughter on the line to check on the status of an authorization sent at the beginning of this year.Caren Griffins at [facility] stated she does not have a direct line to be reached.Please call the patients daughter Janace Hoard at [phone].

## 2021-05-08 DIAGNOSIS — L089 Local infection of the skin and subcutaneous tissue, unspecified: Secondary | ICD-10-CM | POA: Diagnosis not present

## 2021-05-08 DIAGNOSIS — S81812A Laceration without foreign body, left lower leg, initial encounter: Secondary | ICD-10-CM | POA: Diagnosis not present

## 2021-05-26 ENCOUNTER — Ambulatory Visit (INDEPENDENT_AMBULATORY_CARE_PROVIDER_SITE_OTHER): Payer: Medicare HMO | Admitting: Family Medicine

## 2021-05-26 ENCOUNTER — Other Ambulatory Visit: Payer: Self-pay

## 2021-05-26 VITALS — BP 110/70 | HR 63 | Temp 98.6°F | Ht 68.0 in | Wt 153.0 lb

## 2021-05-26 DIAGNOSIS — R3915 Urgency of urination: Secondary | ICD-10-CM

## 2021-05-26 DIAGNOSIS — I480 Paroxysmal atrial fibrillation: Secondary | ICD-10-CM

## 2021-05-26 DIAGNOSIS — W19XXXA Unspecified fall, initial encounter: Secondary | ICD-10-CM

## 2021-05-26 DIAGNOSIS — R413 Other amnesia: Secondary | ICD-10-CM

## 2021-05-26 DIAGNOSIS — Z23 Encounter for immunization: Secondary | ICD-10-CM | POA: Diagnosis not present

## 2021-05-26 DIAGNOSIS — R251 Tremor, unspecified: Secondary | ICD-10-CM | POA: Diagnosis not present

## 2021-05-26 DIAGNOSIS — E7800 Pure hypercholesterolemia, unspecified: Secondary | ICD-10-CM | POA: Diagnosis not present

## 2021-05-26 DIAGNOSIS — R350 Frequency of micturition: Secondary | ICD-10-CM | POA: Diagnosis not present

## 2021-05-26 LAB — POCT URINALYSIS DIPSTICK
Bilirubin, UA: NEGATIVE
Blood, UA: NEGATIVE
Glucose, UA: NEGATIVE
Ketones, UA: NEGATIVE
Nitrite, UA: NEGATIVE
Protein, UA: NEGATIVE
Spec Grav, UA: <=1.005 — AB (ref 1.010–1.025)
Urobilinogen, UA: 1 E.U./dL
pH, UA: 5.5 (ref 5.0–8.0)

## 2021-05-26 NOTE — Progress Notes (Signed)
[name], MD Phone: [phone]  Jill Snow is a 87 y.o. female who presents today for follow-up.  Atrial fibrillation: Taking Eliquis and amiodarone.  No bleeding.  No palpitations.  Hyperlipidemia: Taking Lipitor.  No chest pain, claudication, radicular pain, or myalgias.  Follow-up: This occurred a few weeks ago.  She had some skin tears on her left leg.  She went to urgent care and they placed her on antibiotics.  They note the area has been healing well.  No pain.  No head injury.  No loss of consciousness.  She reports she tripped while walking outside.  Dementia: Her daughter notes some intermittent issues with confusion though seems to be a little more confused recently.  Notes the patient try to leave the daughter's apartment.  The daughter wonders if she has a UTI.  The patient notes occasional urgency though no frequency or dysuria.  Tremor: This is a resting tremor just in her right hand.  This has been going on 6 months.  Social History   Tobacco Use  Smoking Status Never  Smokeless Tobacco Never    Current Outpatient Medications on File Prior to Visit  Medication Sig Dispense Refill   amiodarone (PACERONE) 200 MG tablet Take 1 tablet (200 mg total) by mouth daily. 90 tablet 2   atorvastatin (LIPITOR) 40 MG tablet Take 1 tablet (40 mg total) by mouth daily. 90 tablet 1   cephALEXin (KEFLEX) 500 MG capsule Take 500 mg by mouth every 12 (twelve) hours.     CVS D3 50 MCG (2000 UT) CAPS TAKE 1 CAPSULE EVERY DAY (Patient taking differently: Take 10,000 Units by mouth at bedtime.) 100 capsule 3   ELIQUIS 5 MG TABS tablet TAKE 1 TABLET (5 MG TOTAL) BY MOUTH 2 (TWO) TIMES DAILY. (Patient taking differently: Take 5 mg by mouth 2 (two) times daily.) 180 tablet 1   fluticasone (FLONASE) 50 MCG/ACT nasal spray USE 2 SPRAYS IN EACH NOSTRIL EVERY DAY 48 g 1   furosemide (LASIX) 20 MG tablet TAKE 1 TABLET DAILY. MAY TAKE 1 EXTRA TABLET DAILY AS NEEDED FOR WEIGHT GAIN OF  2 POUNDS OVERNIGHT OR 5 POUNDS IN 1 WEEK 90 tablet 2   IBU 400 MG tablet TAKE ONE TABLET BY MOUTH EVERY 8 HOURS AS NEEDED FOR MILD PAIN (Patient taking differently: Take 400 mg by mouth every 8 (eight) hours as needed for mild pain.) 30 tablet 0   melatonin 5 MG TABS Take 5 mg by mouth at bedtime.     montelukast (SINGULAIR) 10 MG tablet TAKE 1 TABLET EVERY DAY 90 tablet 1   potassium chloride 20 MEQ/15ML (10%) SOLN Take 15 mLs (20 mEq total) by mouth daily. 473 mL 2   vitamin E 400 UNIT capsule Take 400 Units by mouth daily.     No current facility-administered medications on file prior to visit.     ROS see history of present illness  Objective  Physical Exam Vitals:   05/26/21 1452  BP: 110/70  Pulse: 63  Temp: 98.6 F (37 C)  SpO2: 97%    BP Readings from Last 3 Encounters:  05/26/21 110/70  12/24/20 100/60  12/17/20 128/66   Wt Readings from Last 3 Encounters:  05/26/21 153 lb (69.4 kg)  12/24/20 155 lb (70.3 kg)  12/16/20 160 lb (72.6 kg)    Physical Exam Constitutional:      General: She is not in acute distress.    Appearance: She is not diaphoretic.  Cardiovascular:  Rate and Rhythm: Normal rate and regular rhythm.     Heart sounds: Normal heart sounds.  Pulmonary:     Effort: Pulmonary effort is normal.     Breath sounds: Normal breath sounds.  Skin:    General: Skin is warm and dry.     Comments: Well-healing wounds left anterior lower leg, no signs of infection  Neurological:     Mental Status: She is alert.     Comments: EOMI, PERRL, opens and closes eyes adequately, V1 through V3 intact to light touch bilaterally, hearing intact to finger rub, shoulder shrug intact, 5/5 strength in bilateral biceps, triceps, grip, quads, hamstrings, plantar and dorsiflexion, sensation to light touch intact in bilateral UE and LE, normal gait, no cogwheeling in either arm     Assessment/Plan: Please see individual problem list.  Problem List Items Addressed  This Visit     Atrial fibrillation ([place]) - Primary    Sinus rhythm today.  She will continue her medication management by cardiology.      Fall    Recent fall seemed to be mechanical.  She seems to be healing well.  She will monitor the healing wounds on her legs.  Tips for avoiding falls provided to the patient and her daughter in the AVS.      Hypercholesterolemia    Continue Lipitor 40 mg once daily.  Check hepatic function panel and lipid panel.      Relevant Orders   Hepatic function panel   Lipid panel   Memory difficulty    Chronic issue.  They will monitor.      Tremor    Unilateral resting tremor is concerning for Parkinson's.  We will place referral to neurology.  We will check lab work to evaluate for other causes of her tremor.      Relevant Orders   TSH   Basic Metabolic Panel (BMET)   Ambulatory referral to Neurology   Urinary urgency    We will check a urinalysis today to evaluate for UTI.      Relevant Orders   POCT Urinalysis Dipstick (Completed)   Urine Culture   Other Visit Diagnoses     Need for immunization against influenza       Relevant Orders   Flu Vaccine QUAD High Dose(Fluad) (Completed)        Return in about 3 months (around 08/26/2021).  This visit occurred during the SARS-CoV-2 public health emergency.  Safety protocols were in place, including screening questions prior to the visit, additional usage of staff PPE, and extensive cleaning of exam room while observing appropriate contact time as indicated for disinfecting solutions.    [name], MD [place]

## 2021-05-26 NOTE — Assessment & Plan Note (Signed)
Unilateral resting tremor is concerning for Parkinson's.  We will place referral to neurology.  We will check lab work to evaluate for other causes of her tremor.

## 2021-05-26 NOTE — Assessment & Plan Note (Signed)
Chronic issue.  They will monitor.

## 2021-05-26 NOTE — Assessment & Plan Note (Addendum)
Continue Lipitor 40 mg once daily.  Check hepatic function panel and lipid panel.

## 2021-05-26 NOTE — Assessment & Plan Note (Signed)
We will check a urinalysis today to evaluate for UTI.

## 2021-05-26 NOTE — Patient Instructions (Signed)
Nice to see you. Somebody from neurology should call you to schedule an appointment. We will contact you with your lab results. I have included some tips for avoiding falls below.  It is important to avoid accidents which may result in broken bones.  Here are a few ideas on how to make your home safer so you will be less likely to trip or fall.  Use nonskid mats or non slip strips in your shower or tub, on your bathroom floor and around sinks.  If you know that you have spilled water, wipe it up! In the bathroom, it is important to have properly installed grab bars on the walls or on the edge of the tub.  Towel racks are NOT strong enough for you to hold onto or to pull on for support. Stairs and hallways should have enough light.  Add lamps or night lights if you need ore light. It is good to have handrails on both sides of the stairs if possible.  Always fix broken handrails right away. It is important to see the edges of steps.  Paint the edges of outdoor steps white so you can see them better.  Put colored tape on the edge of inside steps. Throw-rugs are dangerous because they can slide.  Removing the rugs is the best idea, but if they must stay, add adhesive carpet tape to prevent slipping. Do not keep things on stairs or in the halls.  Remove small furniture that blocks the halls as it may cause you to trip.  Keep telephone and electrical cords out of the way where you walk. Always were sturdy, rubber-soled shoes for good support.  Never wear just socks, especially on the stairs.  Socks may cause you to slip or fall.  Do not wear full-length housecoats as you can easily trip on the bottom.  Place the things you use the most on the shelves that are the easiest to reach.  If you use a stepstool, make sure it is in good condition.  If you feel unsteady, DO NOT climb, ask for help. If a health professional advises you to use a cane or walker, do not be ashamed.  These items can keep you from  falling and breaking your bones.

## 2021-05-26 NOTE — Assessment & Plan Note (Signed)
Sinus rhythm today.  She will continue her medication management by cardiology.

## 2021-05-26 NOTE — Assessment & Plan Note (Addendum)
Recent fall seemed to be mechanical.  She seems to be healing well.  She will monitor the healing wounds on her legs.  Tips for avoiding falls provided to the patient and her daughter in the AVS.

## 2021-05-27 ENCOUNTER — Other Ambulatory Visit: Payer: Self-pay | Admitting: Family Medicine

## 2021-05-27 DIAGNOSIS — E876 Hypokalemia: Secondary | ICD-10-CM

## 2021-05-27 LAB — URINE CULTURE
MICRO NUMBER:: 12482010
SPECIMEN QUALITY:: ADEQUATE

## 2021-05-27 LAB — HEPATIC FUNCTION PANEL
ALT: 33 U/L (ref 0–35)
AST: 29 U/L (ref 0–37)
Albumin: 4.2 g/dL (ref 3.5–5.2)
Alkaline Phosphatase: 70 U/L (ref 39–117)
Bilirubin, Direct: 0.2 mg/dL (ref 0.0–0.3)
Total Bilirubin: 1.1 mg/dL (ref 0.2–1.2)
Total Protein: 6.9 g/dL (ref 6.0–8.3)

## 2021-05-27 LAB — TSH: TSH: 2.52 u[IU]/mL (ref 0.35–5.50)

## 2021-05-27 LAB — LIPID PANEL
Cholesterol: 155 mg/dL (ref 0–200)
HDL: 40.9 mg/dL (ref >39.00)
LDL Cholesterol: 84 mg/dL (ref 0–99)
NonHDL: 114.35
Total CHOL/HDL Ratio: 4
Triglycerides: 150 mg/dL — ABNORMAL HIGH (ref 0.0–149.0)
VLDL: 30 mg/dL (ref 0.0–40.0)

## 2021-05-27 LAB — BASIC METABOLIC PANEL
BUN: 10 mg/dL (ref 6–23)
CO2: 31 mEq/L (ref 19–32)
Calcium: 9.4 mg/dL (ref 8.4–10.5)
Chloride: 101 mEq/L (ref 96–112)
Creatinine, Ser: 0.87 mg/dL (ref 0.40–1.20)
GFR: 60.71 mL/min (ref >60.00)
Glucose, Bld: 96 mg/dL (ref 70–99)
Potassium: 3.3 mEq/L — ABNORMAL LOW (ref 3.5–5.1)
Sodium: 140 mEq/L (ref 135–145)

## 2021-05-27 MED ORDER — POTASSIUM CHLORIDE CRYS ER 20 MEQ PO TBCR: 40.0000 meq | EXTENDED_RELEASE_TABLET | Freq: Every day | ORAL | 0 refills | Status: DC

## 2021-05-28 ENCOUNTER — Telehealth: Payer: Self-pay | Admitting: Family Medicine

## 2021-05-28 NOTE — Telephone Encounter (Signed)
Patients daughter is calling to see if her mothers urine sample and lab results are in from her appointment from Monday with Dr.Sonnenberg and to see if she will need to start an antibiotic.Please advise.

## 2021-05-29 NOTE — Telephone Encounter (Signed)
Patient's daughter spoke to Gae Bon this morning about a antibiotic. Nina sent Dr Caryl Bis a message. Waiting for provider to respond.

## 2021-05-30 ENCOUNTER — Telehealth: Payer: Self-pay

## 2021-05-30 DIAGNOSIS — R3915 Urgency of urination: Secondary | ICD-10-CM

## 2021-05-30 NOTE — Telephone Encounter (Signed)
The daughter stated taht the patient can take doxycycline.  Gae Bon, cma

## 2021-05-30 NOTE — Telephone Encounter (Signed)
Patient's daughter, Levada Dy called, [phone]. She states patient is getting sicker. Does she need a antibiotic?

## 2021-05-30 NOTE — Telephone Encounter (Signed)
Call pt This is very important and daughter/patient needs to understand that the bacteria growing in her urine is only susceptible to penicillins.  I see a penicillin allergy on her chart.    I wont to be VERY sure that the only thing that ever happened on the Augmentin was nausea and vomiting.  Please ensure no shortness of breath, difficulty breathing, hives or rash?  Looks like she was on Augmentin as prescribed in January 2016.  Can she take keflex without allergy?  Let me know as I would prefer to send in penicillin but worried about safety.

## 2021-05-30 NOTE — Telephone Encounter (Signed)
FYI dr Caryl Bis, Jill Snow   I spoke with daughter , Jill Snow Patient has dementia She feels well today. She is drinking and eating normally.  No urinary complaints, changes in mentation, fever, hematuria.   Consulted with Dr Hollice Espy, urology, and reviewed urine culture positive Streptococcus agalactiae Abnormal   and  POC UA which was negative for nitrites, blood. Small WBCs  She advised likely a contaminant and to recheck urine in the absence of frank symptoms. Patient difficult to collect urine per daughter and has to use urinary hat.   She has appt for labs 1017/22 and I have added repeat urine studies and advised CLEAN catch with assistance of nurses here.  Declines urology consult She will follow with [facility]

## 2021-06-01 NOTE — Telephone Encounter (Signed)
Thank you for taking care of this. Once the urine results return you can send them to me to follow-up on.

## 2021-06-02 ENCOUNTER — Encounter: Payer: Self-pay | Admitting: Physician Assistant

## 2021-06-02 ENCOUNTER — Other Ambulatory Visit: Payer: Self-pay

## 2021-06-02 ENCOUNTER — Ambulatory Visit (INDEPENDENT_AMBULATORY_CARE_PROVIDER_SITE_OTHER): Payer: Medicare HMO

## 2021-06-02 ENCOUNTER — Ambulatory Visit: Payer: Medicare HMO | Admitting: Physician Assistant

## 2021-06-02 VITALS — Ht 68.0 in | Wt 153.0 lb

## 2021-06-02 VITALS — BP 125/74 | HR 93 | Resp 18 | Ht 67.5 in | Wt 152.0 lb

## 2021-06-02 DIAGNOSIS — Z0000 Encounter for general adult medical examination without abnormal findings: Secondary | ICD-10-CM | POA: Diagnosis not present

## 2021-06-02 DIAGNOSIS — G301 Alzheimer's disease with late onset: Secondary | ICD-10-CM | POA: Diagnosis not present

## 2021-06-02 DIAGNOSIS — R251 Tremor, unspecified: Secondary | ICD-10-CM

## 2021-06-02 DIAGNOSIS — F02818 Dementia in other diseases classified elsewhere, unspecified severity, with other behavioral disturbance: Secondary | ICD-10-CM

## 2021-06-02 NOTE — Progress Notes (Signed)
Subjective:   Jill Snow is a 87 y.o. female who presents for Medicare Annual (Subsequent) preventive examination.  Review of Systems    No ROS.  Medicare Wellness Virtual Visit.  Visual/audio telehealth visit, UTA vital signs.   See social history for additional risk factors.   Cardiac Risk Factors include: advanced age (>24men, >28 women)     Objective:    Today's Vitals   06/02/21 1119  Weight: 153 lb (69.4 kg)  Height: 5\' 8"  (1.727 m)   Body mass index is 23.26 kg/m.  Advanced Directives 06/02/2021 12/16/2020 05/30/2020 04/23/2020 05/19/2019 05/16/2018 05/16/2018  Does Patient Have a Medical Advance Directive? No No No No No No No  Type of Advance Directive - - - - - - -  Would patient like information on creating a medical advance directive? No - Patient declined No - Patient declined Yes (MAU/Ambulatory/Procedural Areas - Information given) No - Guardian declined No - Patient declined No - Patient declined Yes (MAU/Ambulatory/Procedural Areas - Information given)    Current Medications (verified) Outpatient Encounter Medications as of 06/02/2021  Medication Sig   amiodarone (PACERONE) 200 MG tablet Take 1 tablet (200 mg total) by mouth daily.   atorvastatin (LIPITOR) 40 MG tablet Take 1 tablet (40 mg total) by mouth daily.   cephALEXin (KEFLEX) 500 MG capsule Take 500 mg by mouth every 12 (twelve) hours.   [employer] D3 50 MCG (2000 UT) CAPS TAKE 1 CAPSULE EVERY DAY (Patient taking differently: Take 10,000 Units by mouth at bedtime.)   ELIQUIS 5 MG TABS tablet TAKE 1 TABLET (5 MG TOTAL) BY MOUTH 2 (TWO) TIMES DAILY. (Patient taking differently: Take 5 mg by mouth 2 (two) times daily.)   fluticasone (FLONASE) 50 MCG/ACT nasal spray USE 2 SPRAYS IN EACH NOSTRIL EVERY DAY   furosemide (LASIX) 20 MG tablet TAKE 1 TABLET DAILY. MAY TAKE 1 EXTRA TABLET DAILY AS NEEDED FOR WEIGHT GAIN OF 2 POUNDS OVERNIGHT OR 5 POUNDS IN 1 WEEK   IBU 400 MG tablet TAKE ONE TABLET BY MOUTH EVERY 8  HOURS AS NEEDED FOR MILD PAIN (Patient taking differently: Take 400 mg by mouth every 8 (eight) hours as needed for mild pain.)   melatonin 5 MG TABS Take 5 mg by mouth at bedtime.   montelukast (SINGULAIR) 10 MG tablet TAKE 1 TABLET EVERY DAY   potassium chloride 20 MEQ/15ML (10%) SOLN Take 15 mLs (20 mEq total) by mouth daily.   potassium chloride SA (KLOR-CON) 20 MEQ tablet Take 2 tablets (40 mEq total) by mouth daily for 3 days.   vitamin E 400 UNIT capsule Take 400 Units by mouth daily.   No facility-administered encounter medications on file as of 06/02/2021.    Allergies (verified) Augmentin [amoxicillin-pot clavulanate], Codeine, Crestor [rosuvastatin], and Penicillins   History: Past Medical History:  Diagnosis Date   Atrial flutter ([place])    a. s/p successful TEE/DCCV in 2014; b. TEE 2014 showed EF 45-50%, mild bi-atrial enlargement, mod MR   Dyspnea    History of chicken pox    Hypercholesterolemia    Hypertension    Mitral regurgitation    a. echo 2014: EF 40-45%, mildly dilated RV, mod reduced RV systolic fxn, mod dilated LA, Mild to mod MR, mod TR, mildly elevated PASP   PAF (paroxysmal atrial fibrillation) ([place])    a. initial episode 2011; b. not on long term anticoagulation since 06/2013   RBBB (right bundle branch block)    Seasonal allergies  Sleep apnea    a. on CPAP   Past Surgical History:  Procedure Laterality Date   ABDOMINAL HYSTERECTOMY     TONSILLECTOMY AND ADENOIDECTOMY  1947   tummy tuck     Family History  Problem Relation Age of Onset   Stroke Mother    Heart attack Mother    Arthritis Mother    Hyperlipidemia Mother    Heart disease Mother    Diabetes Mother    Diabetes Sister    Cancer Daughter    Heart disease Daughter    Diabetes Daughter    Diabetes Son    Diabetes Maternal Grandmother    Diabetes Maternal Grandfather    Social History   Socioeconomic History   Marital status: Widowed    Spouse name: Not on file   Number of  children: 2   Years of education: Not on file   Highest education level: Not on file  Occupational History   Occupation: REALTOR    Employer: [employer]  Tobacco Use   Smoking status: Never   Smokeless tobacco: Never  Vaping Use   Vaping Use: Never used  Substance and Sexual Activity   Alcohol use: No   Drug use: No   Sexual activity: Never  Other Topics Concern   Not on file  Social History Narrative   Not on file   Social Determinants of Health   Financial Resource Strain: Low Risk    Difficulty of Paying Living Expenses: Not hard at all  Food Insecurity: No Food Insecurity   Worried About Charity fundraiser in the Last Year: Never true   [place] in the Last Year: Never true  Transportation Needs: No Transportation Needs   Lack of Transportation (Medical): No   Lack of Transportation (Non-Medical): No  Physical Activity: Insufficiently Active   Days of Exercise per Week: 3 days   Minutes of Exercise per Session: 20 min  Stress: No Stress Concern Present   Feeling of Stress : Not at all  Social Connections: Unknown   Frequency of Communication with Friends and Family: Not on file   Frequency of Social Gatherings with Friends and Family: More than three times a week   Attends Religious Services: Not on Electrical engineer or Organizations: Not on file   Attends Archivist Meetings: Not on file   Marital Status: Not on file    Tobacco Counseling Counseling given: Not Answered   Clinical Intake:  Pre-visit preparation completed: Yes        Diabetes: No  How often do you need to have someone help you when you read instructions, pamphlets, or other written materials from your doctor or pharmacy?: 5 - Always           Activities of Daily Living In your present state of health, do you have any difficulty performing the following activities: 06/02/2021  Hearing? N  Vision? N  Difficulty concentrating or making decisions? Y   Comment Dx Memory Loss. Followed by PCP.  Walking or climbing stairs? N  Dressing or bathing? N  Doing errands, shopping? Y  Comment [place] and eating ? Y  Comment Family assist with meal prep. Self feed.  Using the Toilet? N  In the past six months, have you accidently leaked urine? N  Do you have problems with loss of bowel control? N  Managing your Medications? Y  Comment Family assist  Managing your Finances?  Y  Comment Family assist  Housekeeping or managing your Housekeeping? Y  Comment Family assist  Some recent data might be hidden    Patient Care Team: Leone Haven, MD as PCP - General (Family Medicine) Rockey Situ, Kathlene November, MD as PCP - Cardiology (Cardiology) Minna Merritts, MD as Consulting Physician (Cardiology)  Indicate any recent Medical Services you may have received from other than [facility] providers in the past year (date may be approximate).     Assessment:   This is a routine wellness examination for Valyn.  I connected with Anahlia today by telephone and verified that I am speaking with the correct person using two identifiers. Location patient: home Location provider: work Persons participating in the virtual visit: patient, daughter and Marine scientist.    I discussed the limitations, risks, security and privacy concerns of performing an evaluation and management service by telephone and the availability of in person appointments. The patient expressed understanding and verbally consented to this telephonic visit.    Interactive audio and video telecommunications were attempted between this provider and patient, however failed, due to patient having technical difficulties OR patient did not have access to video capability.  We continued and completed visit with audio only.  Some vital signs may be absent or patient reported.   Hearing/Vision screen Hearing Screening - Comments:: Patient is able to hear conversational tones without  difficulty. No issues reported. Vision Screening - Comments:: Followed by Dr. Gloriann Loan Cataract extracted, bilateral    Dietary issues and exercise activities discussed: Current Exercise Habits: Home exercise routine, Type of exercise: walking, Time (Minutes): 20, Frequency (Times/Week): 3, Weekly Exercise (Minutes/Week): 60, Intensity: Mild Regular diet Fair water intake    Goals Addressed             This Visit's Progress    Follow up with Primary Care Provider       Maintain healthy lifestyle: healthy diet, stay active, stay hydrated       Depression Screen [facility] 2/9 Scores 06/02/2021 05/26/2021 05/30/2020 04/12/2020 12/29/2019 05/19/2019 05/16/2018  PHQ - 2 Score 0 0 0 0 0 0 0  PHQ- 9 Score - - - - - - -    Fall Risk Fall Risk  06/02/2021 05/26/2021 11/04/2020 05/30/2020 04/12/2020  Falls in the past year? 1 1 0 0 0  Number falls in past yr: - 0 0 0 0  Injury with Fall? - 1 0 - -  Risk for fall due to : - History of fall(s) - - -  Follow up Falls evaluation completed Falls evaluation completed - Falls evaluation completed Falls evaluation completed    [place]: Any stairs in or around the home? Yes  If so, are there any without handrails? No  Home free of loose throw rugs in walkways, pet beds, electrical cords, etc? Yes  Adequate lighting in your home to reduce risk of falls? Yes   ASSISTIVE DEVICES UTILIZED TO PREVENT FALLS: Life alert? No  Use of a cane, walker or w/c? No  Grab bars in the bathroom? No  Shower chair or bench in shower? No  Elevated toilet seat or a handicapped toilet? No   TIMED UP AND GO: Was the test performed? No .   Cognitive Function: Patient is alert x2.  Memory loss MMSE - Mini Mental State Exam 11/04/2020 05/30/2020  Not completed: - Unable to complete  Orientation to time 0 -  Orientation to Place 1 -  Registration 3 -  Attention/ Calculation 0 -  Recall 0 -  Language- name 2 objects 0 -  Language-  repeat 0 -  Language- follow 3 step command 3 -  Language- read & follow direction 1 -  Write a sentence 0 -  Copy design 0 -  Total score 8 -     6CIT Screen 06/02/2021 05/16/2018  What Year? 4 points 4 points  What month? 3 points 3 points  What time? 3 points 0 points  Count back from 20 - 0 points  Months in reverse - 4 points  Repeat phrase - 2 points  Total Score - 13    Immunizations Immunization History  Administered Date(s) Administered   Fluad Quad(high Dose 65+) 05/26/2021   Hep A / Hep B 04/22/2016, 05/27/2016   Influenza Split 05/31/2013   Influenza, High Dose Seasonal PF 06/21/2017, 05/18/2018   Influenza,inj,Quad PF,6+ Mos 05/15/2014, 06/14/2015, 04/22/2016   PFIZER(Purple Top)SARS-COV-2 Vaccination 12/11/2019   Pneumococcal Conjugate-13 06/14/2015   Pneumococcal Polysaccharide-23 07/18/2013   Tdap 01/02/2014, 06/16/2018, 06/17/2018   Urine Microalbumin- followed by pcp. Lab scheduled 07/05/21.   Shingrix Completed?: No.    Education has been provided regarding the importance of this vaccine. Patient has been advised to call insurance company to determine out of pocket expense if they have not yet received this vaccine. Advised may also receive vaccine at local pharmacy or Health Dept. Verbalized acceptance and understanding.  Health Maintenance Health Maintenance  Topic Date Due   URINE MICROALBUMIN  07/03/2021 (Originally 11/16/1943)   Zoster Vaccines- Shingrix (1 of 2) 09/02/2021 (Originally 11/17/1952)   DEXA SCAN  06/02/2022 (Originally 11/16/2000)   TETANUS/TDAP  06/17/2028   INFLUENZA VACCINE  Completed   HPV VACCINES  Aged Out   COVID-19 Vaccine  Discontinued   Colorectal cancer screening: No longer required.   Mammogram- declined.  Bone density- deferred per patient preference.   Lung Cancer Screening: (Low Dose CT Chest recommended if Age 71-80 years, 30 pack-year currently smoking OR have quit w/in 15years.) does not qualify.   Hepatitis C  Screening: does not qualify  Vision Screening: Recommended annual ophthalmology exams for early detection of glaucoma and other disorders of the eye.  Dental Screening: Recommended annual dental exams for proper oral hygiene. Visits approved through [facility] prior to visit. Annual visits.   Community Resource Referral / Chronic Care Management: CRR required this visit?  No   CCM required this visit?  No      Plan:     I have personally reviewed and noted the following in the patient's chart:   Medical and social history Use of alcohol, tobacco or illicit drugs  Current medications and supplements including opioid prescriptions. Not taking opioid.  Functional ability and status Nutritional status Physical activity Advanced directives List of other physicians Hospitalizations, surgeries, and ER visits in previous 12 months Vitals Screenings to include cognitive, depression, and falls Referrals and appointments  In addition, I have reviewed and discussed with patient certain preventive protocols, quality metrics, and best practice recommendations. A written personalized care plan for preventive services as well as general preventive health recommendations were provided to patient.     Varney Biles, LPN   [id number]

## 2021-06-02 NOTE — Patient Instructions (Signed)
It was a pleasure to see you today at our office.   Recommendations:  Follow up in  3 months   RECOMMENDATIONS FOR ALL PATIENTS WITH MEMORY PROBLEMS: 1. Continue to exercise (Recommend 30 minutes of walking everyday, or 3 hours every week) 2. Increase social interactions - continue going to [place] and enjoy social gatherings with friends and family 3. Eat healthy, avoid fried foods and eat more fruits and vegetables 4. Maintain adequate blood pressure, blood sugar, and blood cholesterol level. Reducing the risk of stroke and cardiovascular disease also helps promoting better memory. 5. Avoid stressful situations. Live a simple life and avoid aggravations. Organize your time and prepare for the next day in anticipation. 6. Sleep well, avoid any interruptions of sleep and avoid any distractions in the bedroom that may interfere with adequate sleep quality 7. Avoid sugar, avoid sweets as there is a strong link between excessive sugar intake, diabetes, and cognitive impairment We discussed the Mediterranean diet, which has been shown to help patients reduce the risk of progressive memory disorders and reduces cardiovascular risk. This includes eating fish, eat fruits and green leafy vegetables, nuts like almonds and hazelnuts, walnuts, and also use olive oil. Avoid fast foods and fried foods as much as possible. Avoid sweets and sugar as sugar use has been linked to worsening of memory function.  There is always a concern of gradual progression of memory problems. If this is the case, then we may need to adjust level of care according to patient needs. Support, both to the patient and caregiver, should then be put into place.    FALL PRECAUTIONS: Be cautious when walking. Scan the area for obstacles that may increase the risk of trips and falls. When getting up in the mornings, sit up at the edge of the bed for a few minutes before getting out of bed. Consider elevating the bed at the head end to  avoid drop of blood pressure when getting up. Walk always in a well-lit room (use night lights in the walls). Avoid area rugs or power cords from appliances in the middle of the walkways. Use a walker or a cane if necessary and consider physical therapy for balance exercise. Get your eyesight checked regularly.  FINANCIAL OVERSIGHT: Supervision, especially oversight when making financial decisions or transactions is also recommended.  HOME SAFETY: Consider the safety of the kitchen when operating appliances like stoves, microwave oven, and blender. Consider having supervision and share cooking responsibilities until no longer able to participate in those. Accidents with firearms and other hazards in the house should be identified and addressed as well.   ABILITY TO BE LEFT ALONE: If patient is unable to contact 911 operator, consider using LifeLine, or when the need is there, arrange for someone to stay with patients. Smoking is a fire hazard, consider supervision or cessation. Risk of wandering should be assessed by caregiver and if detected at any point, supervision and safe proof recommendations should be instituted.  MEDICATION SUPERVISION: Inability to self-administer medication needs to be constantly addressed. Implement a mechanism to ensure safe administration of the medications.   DRIVING: Regarding driving, in patients with progressive memory problems, driving will be impaired. We advise to have someone else do the driving if trouble finding directions or if minor accidents are reported. Independent driving assessment is available to determine safety of driving.   If you are interested in the driving assessment, you can contact the following:  [employer] in [place]  Driver  Rehabilitative Services [phone]  [facility] [phone]  [place] [phone] or [phone]

## 2021-06-02 NOTE — Patient Instructions (Addendum)
Jill Snow , Thank you for taking time to come for your Medicare Wellness Visit. I appreciate your ongoing commitment to your health goals. Please review the following plan we discussed and let me know if I can assist you in the future.   These are the goals we discussed:  Goals      Follow up with Primary Care Provider     Maintain healthy lifestyle: healthy diet, stay active, stay hydrated        This is a list of the screening recommended for you and due dates:  Health Maintenance  Topic Date Due   Urine Protein Check  07/03/2021*   Zoster (Shingles) Vaccine (1 of 2) 09/02/2021*   DEXA scan (bone density measurement)  06/02/2022*   Tetanus Vaccine  06/17/2028   Flu Shot  Completed   HPV Vaccine  Aged Out   COVID-19 Vaccine  Discontinued  *Topic was postponed. The date shown is not the original due date.   Advanced directives: not yet completed  Conditions/risks identified: none new  Follow up in one year for your annual wellness visit    Preventive Care 65 Years and Older, Female Preventive care refers to lifestyle choices and visits with your health care provider that can promote health and wellness. What does preventive care include? A yearly physical exam. This is also called an annual well check. Dental exams once or twice a year. Routine eye exams. Ask your health care provider how often you should have your eyes checked. Personal lifestyle choices, including: Daily care of your teeth and gums. Regular physical activity. Eating a healthy diet. Avoiding tobacco and drug use. Limiting alcohol use. Practicing safe sex. Taking low-dose aspirin every day. Taking vitamin and mineral supplements as recommended by your health care provider. What happens during an annual well check? The services and screenings done by your health care provider during your annual well check will depend on your age, overall health, lifestyle risk factors, and family history of  disease. Counseling  Your health care provider may ask you questions about your: Alcohol use. Tobacco use. Drug use. Emotional well-being. Home and relationship well-being. Sexual activity. Eating habits. History of falls. Memory and ability to understand (cognition). Work and work Statistician. Reproductive health. Screening  You may have the following tests or measurements: Height, weight, and BMI. Blood pressure. Lipid and cholesterol levels. These may be checked every 5 years, or more frequently if you are over 66 years old. Skin check. Lung cancer screening. You may have this screening every year starting at age 60 if you have a 30-pack-year history of smoking and currently smoke or have quit within the past 15 years. Fecal occult blood test (FOBT) of the stool. You may have this test every year starting at age 39. Flexible sigmoidoscopy or colonoscopy. You may have a sigmoidoscopy every 5 years or a colonoscopy every 10 years starting at age 73. Hepatitis C blood test. Hepatitis B blood test. Sexually transmitted disease (STD) testing. Diabetes screening. This is done by checking your blood sugar (glucose) after you have not eaten for a while (fasting). You may have this done every 1-3 years. Bone density scan. This is done to screen for osteoporosis. You may have this done starting at age 25. Mammogram. This may be done every 1-2 years. Talk to your health care provider about how often you should have regular mammograms. Talk with your health care provider about your test results, treatment options, and if necessary, the need for more  tests. Vaccines  Your health care provider may recommend certain vaccines, such as: Influenza vaccine. This is recommended every year. Tetanus, diphtheria, and acellular pertussis (Tdap, Td) vaccine. You may need a Td booster every 10 years. Zoster vaccine. You may need this after age 52. Pneumococcal 13-valent conjugate (PCV13) vaccine. One  dose is recommended after age 41. Pneumococcal polysaccharide (PPSV23) vaccine. One dose is recommended after age 63. Talk to your health care provider about which screenings and vaccines you need and how often you need them. This information is not intended to replace advice given to you by your health care provider. Make sure you discuss any questions you have with your health care provider. Document Released: 08/30/2015 Document Revised: 04/22/2016 Document Reviewed: 06/04/2015 Elsevier Interactive Patient Education  2017 [place] Prevention in the Home Falls can cause injuries. They can happen to people of all ages. There are many things you can do to make your home safe and to help prevent falls. What can I do on the outside of my home? Regularly fix the edges of walkways and driveways and fix any cracks. Remove anything that might make you trip as you walk through a door, such as a raised step or threshold. Trim any bushes or trees on the path to your home. Use bright outdoor lighting. Clear any walking paths of anything that might make someone trip, such as rocks or tools. Regularly check to see if handrails are loose or broken. Make sure that both sides of any steps have handrails. Any raised decks and porches should have guardrails on the edges. Have any leaves, snow, or ice cleared regularly. Use sand or salt on walking paths during winter. Clean up any spills in your garage right away. This includes oil or grease spills. What can I do in the bathroom? Use night lights. Install grab bars by the toilet and in the tub and shower. Do not use towel bars as grab bars. Use non-skid mats or decals in the tub or shower. If you need to sit down in the shower, use a plastic, non-slip stool. Keep the floor dry. Clean up any water that spills on the floor as soon as it happens. Remove soap buildup in the tub or shower regularly. Attach bath mats securely with double-sided  non-slip rug tape. Do not have throw rugs and other things on the floor that can make you trip. What can I do in the bedroom? Use night lights. Make sure that you have a light by your bed that is easy to reach. Do not use any sheets or blankets that are too big for your bed. They should not hang down onto the floor. Have a firm chair that has side arms. You can use this for support while you get dressed. Do not have throw rugs and other things on the floor that can make you trip. What can I do in the kitchen? Clean up any spills right away. Avoid walking on wet floors. Keep items that you use a lot in easy-to-reach places. If you need to reach something above you, use a strong step stool that has a grab bar. Keep electrical cords out of the way. Do not use floor polish or wax that makes floors slippery. If you must use wax, use non-skid floor wax. Do not have throw rugs and other things on the floor that can make you trip. What can I do with my stairs? Do not leave any items on the stairs. Make sure  that there are handrails on both sides of the stairs and use them. Fix handrails that are broken or loose. Make sure that handrails are as long as the stairways. Check any carpeting to make sure that it is firmly attached to the stairs. Fix any carpet that is loose or worn. Avoid having throw rugs at the top or bottom of the stairs. If you do have throw rugs, attach them to the floor with carpet tape. Make sure that you have a light switch at the top of the stairs and the bottom of the stairs. If you do not have them, ask someone to add them for you. What else can I do to help prevent falls? Wear shoes that: Do not have high heels. Have rubber bottoms. Are comfortable and fit you well. Are closed at the toe. Do not wear sandals. If you use a stepladder: Make sure that it is fully opened. Do not climb a closed stepladder. Make sure that both sides of the stepladder are locked into place. Ask  someone to hold it for you, if possible. Clearly mark and make sure that you can see: Any grab bars or handrails. First and last steps. Where the edge of each step is. Use tools that help you move around (mobility aids) if they are needed. These include: Canes. Walkers. Scooters. Crutches. Turn on the lights when you go into a dark area. Replace any light bulbs as soon as they burn out. Set up your furniture so you have a clear path. Avoid moving your furniture around. If any of your floors are uneven, fix them. If there are any pets around you, be aware of where they are. Review your medicines with your doctor. Some medicines can make you feel dizzy. This can increase your chance of falling. Ask your doctor what other things that you can do to help prevent falls. This information is not intended to replace advice given to you by your health care provider. Make sure you discuss any questions you have with your health care provider. Document Released: 05/30/2009 Document Revised: 01/09/2016 Document Reviewed: 09/07/2014 Elsevier Interactive Patient Education  2017 [employer].

## 2021-06-02 NOTE — Progress Notes (Signed)
Assessment/Plan:   Late Onset Alzheimer's dementia with behavioral disturbance  This is an 87 year old right-handed woman with a history of atrial fibrillation on Eliquis, arthritis, hypertension, OSA on CPAP, last seen on 11/04/2020, at which time MoCA score was 8/30.  CT of the head without contrast was negative for acute findings.  After discussion of potential side effects and risk outweighing potential benefits, no anti-dementia medication was indicated.  She is stable, in no behavioral disturbance has been reported since her last visit.  Continue 24/7 care Continue controlling vascular risk factors, physical exercises and brain stimulation exercises for brain health Follow-up as needed from the memory standpoint.  Right hand tremor Unknown etiology at this time.  No cogwheeling on exam, review of systems essentially negative pertinent to tremors. Tremors do not affect her ADLs, and she is able to control them. Will reassess in 2 to 3 months, at which time will entertain other testing, and therapy options if they worsened     Subjective:     AUSTIN PONGRATZ is a 87 y.o. female  right-handed woman with a history of atrial fibrillation on Eliquis, arthritis, hypertension, OSA on CPAP, presenting for evaluation of memory loss. Last seen on 11/04/20 MOCA score  was 8/30.  From the memory standpoint, she is overall stable, she may need a little more instructions, "step-by-step ".  She lives in her son's house, but during the day she stays with her daughters who take turns to provide 24/7 care.  Last week, she had a UTI, which brought some visual distortions, but quickly resolved after initiation of antibiotics.  No further visual or auditory hallucinations.  She had a fall 1 month ago in the front yard, but did not hit her head or lost consciousness, this was a mechanical fall.  Her mood is good, denies any depression.  She sleeps well, takes melatonin to help her fall asleep without  difficulty.  Daughter is in charge of her finances and medications.  She no longer drives.  Her appetite is good, and drinks plenty of water. About 6 months ago, the patient began noticing mild tremors in the right hand, which they "do not occur all the time, she has to put the left hand on top of the other, and she makes and stop ".  She states that these tremors appear only when she is not paying attention.  She denies dropping objects.  To carry heavy objects, she uses both hands.  She denies tremors in any of the other extremities.  She denies diplopia.  No history of encephalitis or meningitis.  No history of seizures.  No history of Parkinson's disease in the family.  She denies any headaches, or head trauma.  She has some urinary incontinence and uses pads.  She denies difficulty swallowing, or any significant drooling.  She is able to perform her ADLs such as buttoning or performing any fine motor skills.  She denies any mood changes.  No recent stressful situation.  No history of melanoma.  She denies any dizziness, focal numbness, tingling, or weakness, or headaches.    HISTORY OF PRESENT ILLNESS 11/04/20: This is an 87 year old right-handed woman with a history of atrial fibrillation on Eliquis, arthritis, hypertension, OSA on CPAP, presenting for evaluation of memory loss. Her daughter Janace Hoard is present to provide additional information. She feels her memory is pretty good. She has word-finding difficulties, noted during her visit today as well. She has been living with her son Herbie [place] for the  past 6 years or so. Family started noticing changes around 4 years ago. She was wearing the same clothes. She was leaving food on the stove, they had to take the knobs off  The stove, as well as the washing machine knobs. She was missing medications, Angie started managing them over a year ago. They made a decision to have 24/7 care 1.5 years ago, especially since she started the Eliquis. She started having  difficulties managing finances around 2 years ago, overspending, not realizing the bank would do overdraft so she was always out of money. When she was unsupervised, she was not eating well, family has noticed that with balanced meals, she was repeating herself less and there was improvement in personality as well. She does very well with routines, when she is out of routine, she would get more agitated. She stopped driving over a year ago. She is independent with dressing and bathing. She was hospitalized in 04/2020 for atrial fibrillation with RVR, at that time she had delirium, cussing and agitated. When sick, she would have hallucinations, "talking out of her head." These were not as bad once she got home. Sleep is overall good, melatonin helps. They have also found that vitamin D supplements help with sleep issues. She has always had high anxiety, she feels her mood is good. When anxiety is up, such as when she is out of routine staying with Angie, she would be up throughout the night. Melatonin helps. No wandering behaviors, she tries to get out the door when she gets upset at [facility], but this has only occurred a couple of times.    She denies any headaches, dizziness, diplopia, dysarthria, dysphagia, neck/back pain, focal numbness/tingling/weakness, bowel/bladder dysfunction. No anosmia, tremors, no falls. She has right hand arthritis. No family history of dementia, no history of significant head injuries or alcohol use.  Lab Results  Component Value Date    TSH 1.347 04/24/2020      Recent Labs       Lab Results  Component Value Date    [id number] 06/27/2018         PREVIOUS MEDICATIONS:   CURRENT MEDICATIONS:  Outpatient Encounter Medications as of 06/02/2021  Medication Sig   amiodarone (PACERONE) 200 MG tablet Take 1 tablet (200 mg total) by mouth daily.   atorvastatin (LIPITOR) 40 MG tablet Take 1 tablet (40 mg total) by mouth daily.   cephALEXin (KEFLEX) 500 MG capsule Take 500  mg by mouth every 12 (twelve) hours.   [facility] D3 50 MCG (2000 UT) CAPS TAKE 1 CAPSULE EVERY DAY (Patient taking differently: Take 10,000 Units by mouth at bedtime.)   ELIQUIS 5 MG TABS tablet TAKE 1 TABLET (5 MG TOTAL) BY MOUTH 2 (TWO) TIMES DAILY. (Patient taking differently: Take 5 mg by mouth 2 (two) times daily.)   fluticasone (FLONASE) 50 MCG/ACT nasal spray USE 2 SPRAYS IN EACH NOSTRIL EVERY DAY   furosemide (LASIX) 20 MG tablet TAKE 1 TABLET DAILY. MAY TAKE 1 EXTRA TABLET DAILY AS NEEDED FOR WEIGHT GAIN OF 2 POUNDS OVERNIGHT OR 5 POUNDS IN 1 WEEK   IBU 400 MG tablet TAKE ONE TABLET BY MOUTH EVERY 8 HOURS AS NEEDED FOR MILD PAIN (Patient taking differently: Take 400 mg by mouth every 8 (eight) hours as needed for mild pain.)   melatonin 5 MG TABS Take 5 mg by mouth at bedtime.   montelukast (SINGULAIR) 10 MG tablet TAKE 1 TABLET EVERY DAY   potassium chloride 20 MEQ/15ML (10%) SOLN  Take 15 mLs (20 mEq total) by mouth daily.   vitamin E 400 UNIT capsule Take 400 Units by mouth daily.   potassium chloride SA (KLOR-CON) 20 MEQ tablet Take 2 tablets (40 mEq total) by mouth daily for 3 days.   No facility-administered encounter medications on file as of 06/02/2021.     Objective:     PHYSICAL EXAMINATION:    VITALS:   Vitals:   06/02/21 1428  BP: 125/74  Pulse: 93  Resp: 18  SpO2: 95%  Weight: 152 lb (68.9 kg)  Height: 5' 7.5" (1.715 m)    GEN:  The patient appears stated age and is in NAD. HEENT:  Normocephalic, atraumatic.   Neurological examination:  General: NAD, well-groomed, appears stated age. Orientation: The patient is alert. Oriented to person, place and date Cranial nerves: There is good facial symmetry.The speech is fluent and clear. No aphasia or dysarthria. Fund of knowledge is reduced.  Recent and remote memory are impaired. Attention and concentration are reduced. Unable to name objects and repeat phrases.  Hearing is intact to conversational tone.    Sensation:  Sensation is intact to light touch throughout Motor: Strength is at least antigravity x4. Tremors: none  DTR's 2/4 in UE/LE    No flowsheet data found. MMSE - Mini Mental State Exam 11/04/2020 05/30/2020  Not completed: - Unable to complete  Orientation to time 0 -  Orientation to Place 1 -  Registration 3 -  Attention/ Calculation 0 -  Recall 0 -  Language- name 2 objects 0 -  Language- repeat 0 -  Language- follow 3 step command 3 -  Language- read & follow direction 1 -  Write a sentence 0 -  Copy design 0 -  Total score 8 -    No flowsheet data found.     Movement examination: Tone: There is normal tone in the UE/LE.  No cogwheeling is noted in the upper extremities.   Abnormal movements: Right hand tremor noted at rest, patient is able to control the tremor.No myoclonus.  No asterixis.  No glabellar sign.  No tremors in her face are noted  Coordination:  There is no decremation with RAM's. Normal finger to nose  Gait and Station: The patient has no difficulty arising out of a deep-seated chair without the use of the hands. The patient's stride length is good.  She has good arm swing.  No Pisa sign.  Gait is cautious and narrow.     Total time spent on today's visit was 30 minutes, including both face-to-face time and nonface-to-face time. Time included that spent on review of records (prior notes available to me/labs/imaging if pertinent), discussing treatment and goals, answering patient's questions and coordinating care.  Cc:  Leone Haven, MD Sharene Butters, PA-C

## 2021-06-03 DIAGNOSIS — G301 Alzheimer's disease with late onset: Secondary | ICD-10-CM | POA: Insufficient documentation

## 2021-06-03 DIAGNOSIS — F02818 Dementia in other diseases classified elsewhere, unspecified severity, with other behavioral disturbance: Secondary | ICD-10-CM | POA: Insufficient documentation

## 2021-06-04 ENCOUNTER — Other Ambulatory Visit: Payer: Self-pay

## 2021-06-04 ENCOUNTER — Other Ambulatory Visit (INDEPENDENT_AMBULATORY_CARE_PROVIDER_SITE_OTHER): Payer: Medicare HMO

## 2021-06-04 DIAGNOSIS — E876 Hypokalemia: Secondary | ICD-10-CM | POA: Diagnosis not present

## 2021-06-04 DIAGNOSIS — R3915 Urgency of urination: Secondary | ICD-10-CM

## 2021-06-04 LAB — URINALYSIS, ROUTINE W REFLEX MICROSCOPIC
Bilirubin Urine: NEGATIVE
Hgb urine dipstick: NEGATIVE
Ketones, ur: NEGATIVE
Nitrite: NEGATIVE
RBC / HPF: NONE SEEN (ref 0–?)
Specific Gravity, Urine: 1.01 (ref 1.000–1.030)
Total Protein, Urine: NEGATIVE
Urine Glucose: NEGATIVE
Urobilinogen, UA: 0.2 (ref 0.0–1.0)
pH: 6 (ref 5.0–8.0)

## 2021-06-04 LAB — POTASSIUM: Potassium: 3.6 mEq/L (ref 3.5–5.1)

## 2021-06-05 LAB — URINE CULTURE
MICRO NUMBER:: 12523538
SPECIMEN QUALITY:: ADEQUATE

## 2021-06-06 ENCOUNTER — Other Ambulatory Visit: Payer: Self-pay

## 2021-06-06 ENCOUNTER — Other Ambulatory Visit: Payer: Self-pay | Admitting: Family

## 2021-06-06 DIAGNOSIS — R350 Frequency of micturition: Secondary | ICD-10-CM

## 2021-06-06 NOTE — Telephone Encounter (Signed)
Sonn[place] addressed urine results

## 2021-06-09 ENCOUNTER — Other Ambulatory Visit: Payer: Self-pay

## 2021-06-09 DIAGNOSIS — R350 Frequency of micturition: Secondary | ICD-10-CM | POA: Diagnosis not present

## 2021-06-09 LAB — POCT URINALYSIS DIPSTICK
Bilirubin, UA: NEGATIVE
Blood, UA: NEGATIVE
Glucose, UA: NEGATIVE
Ketones, UA: NEGATIVE
Nitrite, UA: NEGATIVE
Protein, UA: NEGATIVE
Spec Grav, UA: <=1.005 — AB (ref 1.010–1.025)
Urobilinogen, UA: 0.2 E.U./dL
pH, UA: 5 (ref 5.0–8.0)

## 2021-06-09 NOTE — Telephone Encounter (Signed)
*  STAT* If patient is at the pharmacy, call can be transferred to refill team.   1. Which medications need to be refilled? (please list name of each medication and dose if known) Eliquis  2. Which pharmacy/location (including street and city if local pharmacy) is medication to be sent to? [facility]  3. Do they need a 30 day or 90 day supply? 30  

## 2021-06-09 NOTE — Addendum Note (Signed)
Addended by: Leeanne Rio on: 06/09/2021 04:10 PM   Modules accepted: Orders

## 2021-06-09 NOTE — Telephone Encounter (Signed)
Please review for refill, Thanks !  

## 2021-06-10 MED ORDER — APIXABAN 5 MG PO TABS: 5.0000 mg | ORAL_TABLET | Freq: Two times a day (BID) | ORAL | 1 refills | Status: DC

## 2021-06-10 NOTE — Telephone Encounter (Signed)
Prescription refill request for Eliquis received. Indication:afib Last office visit:gollan 09/09/20 Scr: 0.87 05/26/21 Age: 53f Weight:68.9kg

## 2021-06-11 ENCOUNTER — Encounter: Payer: Self-pay | Admitting: Family Medicine

## 2021-06-12 NOTE — Telephone Encounter (Signed)
I did speak with patient's daughter, Janace Hoard. This was an old message & I assured her once sensitivities report comes back that she will be treated with appropriate penicillin.

## 2021-06-12 NOTE — Telephone Encounter (Signed)
Please call the patient's daughter.  I believe Azzel spoke with her yesterday and this is documented in the result note.  He advised that we needed to wait on sensitivities as the patient has a penicillin allergy.  Penicillins are the typical treatment for this type of bacteria and I need to know what it is sensitive to prior to starting an antibiotic.  It looks like she may have sent this message prior to Azzel calling her.  I do agree that she needs to be treated.  Please let her know that as soon as I see the results from the sensitivities I will send an antibiotic in.

## 2021-06-13 ENCOUNTER — Telehealth: Payer: Self-pay | Admitting: Family Medicine

## 2021-06-13 ENCOUNTER — Other Ambulatory Visit: Payer: Self-pay | Admitting: Family Medicine

## 2021-06-13 LAB — URINE CULTURE
MICRO NUMBER:: 12542078
SPECIMEN QUALITY:: ADEQUATE

## 2021-06-13 LAB — SUSCEPTIBILITY PANEL,AEROBIC BACTERIUM
Micro Number:: 12554178
Specimen Quality:: ADEQUATE

## 2021-06-13 LAB — TEST AUTHORIZATION

## 2021-06-13 MED ORDER — CEPHALEXIN 500 MG PO CAPS: 500.0000 mg | ORAL_CAPSULE | Freq: Two times a day (BID) | ORAL | 0 refills | Status: DC

## 2021-06-13 NOTE — Telephone Encounter (Signed)
I called and spoke with the patient's daughter Janace Hoard.  She noted the patient gets very sick on penicillin products with nausea and vomiting.  I discussed that the other oral option on her sensitivities is Levaquin and that is not a good option given that she is on amiodarone.  I advised that I would need to do a little research and then I would send a MyChart message with my decision on medication.  Based on review of up-to-date it appears that group B strep is typically sensitive to cephalosporins which it appears the patient has taken previously.  I will send in Keflex for her to take.
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
## 2021-07-04 ENCOUNTER — Ambulatory Visit: Payer: Medicare HMO | Admitting: Cardiovascular Disease

## 2021-07-04 ENCOUNTER — Encounter: Payer: Self-pay | Admitting: Cardiovascular Disease

## 2021-07-04 ENCOUNTER — Other Ambulatory Visit: Payer: Self-pay

## 2021-07-04 VITALS — BP 130/70 | HR 56 | Ht 68.0 in | Wt 148.4 lb

## 2021-07-04 DIAGNOSIS — I5022 Chronic systolic (congestive) heart failure: Secondary | ICD-10-CM | POA: Diagnosis not present

## 2021-07-04 DIAGNOSIS — I10 Essential (primary) hypertension: Secondary | ICD-10-CM

## 2021-07-04 DIAGNOSIS — G4733 Obstructive sleep apnea (adult) (pediatric): Secondary | ICD-10-CM | POA: Diagnosis not present

## 2021-07-04 DIAGNOSIS — E782 Mixed hyperlipidemia: Secondary | ICD-10-CM | POA: Diagnosis not present

## 2021-07-04 DIAGNOSIS — I4892 Unspecified atrial flutter: Secondary | ICD-10-CM | POA: Diagnosis not present

## 2021-07-04 DIAGNOSIS — I428 Other cardiomyopathies: Secondary | ICD-10-CM | POA: Diagnosis not present

## 2021-07-04 DIAGNOSIS — E876 Hypokalemia: Secondary | ICD-10-CM | POA: Diagnosis not present

## 2021-07-04 DIAGNOSIS — I480 Paroxysmal atrial fibrillation: Secondary | ICD-10-CM

## 2021-07-04 DIAGNOSIS — I700 Atherosclerosis of aorta: Secondary | ICD-10-CM | POA: Diagnosis not present

## 2021-07-04 DIAGNOSIS — Z9989 Dependence on other enabling machines and devices: Secondary | ICD-10-CM

## 2021-07-04 MED ORDER — POTASSIUM CHLORIDE CRYS ER 20 MEQ PO TBCR
20.0000 meq | EXTENDED_RELEASE_TABLET | Freq: Every day | ORAL | 3 refills | Status: DC
Start: 2021-07-04 — End: 2022-07-06

## 2021-07-04 NOTE — Patient Instructions (Addendum)
Patient assistance for eliquis Please complete and bring back to office to be faxed.   Medication Instructions:  No changes  If you need a refill on your cardiac medications before your next appointment, please call your pharmacy.    Lab work: No new labs needed  Testing/Procedures: No new testing needed  Follow-Up: At [facility], you and your health needs are our priority.  As part of our continuing mission to provide you with exceptional heart care, we have created designated Provider Care Teams.  These Care Teams include your primary Cardiologist (physician) and Advanced Practice Providers (APPs -  Physician Assistants and Nurse Practitioners) who all work together to provide you with the care you need, when you need it.  You will need a follow up appointment in 6 months, APP okay  Providers on your designated Care Team:   Murray Hodgkins, NP Christell Faith, PA-C Cadence Kathlen Mody, [place]  COVID-19 Vaccine Information can be found at: [email or web address] For questions related to vaccine distribution or appointments, please email [email or web address] or call [phone].

## 2021-09-08 ENCOUNTER — Ambulatory Visit: Payer: Medicare HMO | Admitting: Physician Assistant

## 2021-09-17 DIAGNOSIS — Z0120 Encounter for dental examination and cleaning without abnormal findings: Secondary | ICD-10-CM | POA: Insufficient documentation

## 2021-09-22 ENCOUNTER — Encounter: Payer: Self-pay | Admitting: Family Medicine

## 2021-09-22 ENCOUNTER — Other Ambulatory Visit: Payer: Self-pay

## 2021-09-22 ENCOUNTER — Ambulatory Visit (INDEPENDENT_AMBULATORY_CARE_PROVIDER_SITE_OTHER): Payer: Medicare HMO | Admitting: Family Medicine

## 2021-09-22 VITALS — BP 120/70 | HR 69 | Temp 97.6°F | Ht 68.0 in | Wt 151.0 lb

## 2021-09-22 DIAGNOSIS — F02818 Dementia in other diseases classified elsewhere, unspecified severity, with other behavioral disturbance: Secondary | ICD-10-CM | POA: Diagnosis not present

## 2021-09-22 DIAGNOSIS — R911 Solitary pulmonary nodule: Secondary | ICD-10-CM

## 2021-09-22 DIAGNOSIS — J309 Allergic rhinitis, unspecified: Secondary | ICD-10-CM | POA: Diagnosis not present

## 2021-09-22 DIAGNOSIS — I700 Atherosclerosis of aorta: Secondary | ICD-10-CM | POA: Diagnosis not present

## 2021-09-22 DIAGNOSIS — I480 Paroxysmal atrial fibrillation: Secondary | ICD-10-CM | POA: Diagnosis not present

## 2021-09-22 DIAGNOSIS — G301 Alzheimer's disease with late onset: Secondary | ICD-10-CM | POA: Diagnosis not present

## 2021-09-22 NOTE — Progress Notes (Signed)
[name], MD Phone: [phone]  Jill Snow is a 88 y.o. female who presents today for f/u.  The patient's daughter gives most of the history.  Dementia: They report this is generally stable.  She has been having good weeks recently where she has been more alert.  They note her tremor is about the same.  They had to reschedule her neurology follow-up.  Atrial fibrillation: She remains on amiodarone and Eliquis.  No palpitations, chest pain, or bleeding.  Lung nodule: The patient is due for CT follow-up.  Allergic rhinitis: Not many issues currently.  She takes her Singulair daily.  She uses the Flonase as needed.  Social History   Tobacco Use  Smoking Status Never  Smokeless Tobacco Never    Current Outpatient Medications on File Prior to Visit  Medication Sig Dispense Refill   amiodarone (PACERONE) 200 MG tablet Take 1 tablet (200 mg total) by mouth daily. 90 tablet 2   apixaban (ELIQUIS) 5 MG TABS tablet Take 1 tablet (5 mg total) by mouth 2 (two) times daily. 180 tablet 1   atorvastatin (LIPITOR) 40 MG tablet Take 1 tablet (40 mg total) by mouth daily. 90 tablet 1   CVS D3 50 MCG (2000 UT) CAPS TAKE 1 CAPSULE EVERY DAY (Patient taking differently: Take 10,000 Units by mouth at bedtime.) 100 capsule 3   fluticasone (FLONASE) 50 MCG/ACT nasal spray USE 2 SPRAYS IN EACH NOSTRIL EVERY DAY 48 g 1   furosemide (LASIX) 20 MG tablet TAKE 1 TABLET DAILY. MAY TAKE 1 EXTRA TABLET DAILY AS NEEDED FOR WEIGHT GAIN OF 2 POUNDS OVERNIGHT OR 5 POUNDS IN 1 WEEK 90 tablet 2   IBU 400 MG tablet TAKE ONE TABLET BY MOUTH EVERY 8 HOURS AS NEEDED FOR MILD PAIN (Patient taking differently: Take 400 mg by mouth every 8 (eight) hours as needed for mild pain.) 30 tablet 0   melatonin 5 MG TABS Take 5 mg by mouth at bedtime.     montelukast (SINGULAIR) 10 MG tablet TAKE 1 TABLET EVERY DAY 90 tablet 1   potassium chloride SA (KLOR-CON) 20 MEQ tablet Take 1 tablet (20 mEq total) by mouth daily.  90 tablet 3   vitamin E 400 UNIT capsule Take 400 Units by mouth daily.     No current facility-administered medications on file prior to visit.     ROS see history of present illness  Objective  Physical Exam Vitals:   09/22/21 1441  BP: 120/70  Pulse: 69  Temp: 97.6 F (36.4 C)  SpO2: 97%    BP Readings from Last 3 Encounters:  09/22/21 120/70  07/04/21 130/70  06/02/21 125/74   Wt Readings from Last 3 Encounters:  09/22/21 151 lb (68.5 kg)  07/04/21 148 lb 6 oz (67.3 kg)  06/02/21 152 lb (68.9 kg)    Physical Exam Constitutional:      General: She is not in acute distress.    Appearance: She is not diaphoretic.  Cardiovascular:     Rate and Rhythm: Normal rate and regular rhythm.     Heart sounds: Normal heart sounds.  Pulmonary:     Effort: Pulmonary effort is normal.     Breath sounds: Normal breath sounds.  Skin:    General: Skin is warm and dry.  Neurological:     Mental Status: She is alert.     Assessment/Plan: Please see individual problem list.  Problem List Items Addressed This Visit     Allergic rhinitis  Chronic issue.  Stable.  She will continue singular 10 mg daily.  She can continue Flonase as needed.      Aortic atherosclerosis (HCC)    Continue risk factor control.  Patient is on Lipitor.      Atrial fibrillation ([place]) - Primary    Sinus rhythm today.  Amiodarone is managed by cardiology.  She will continue her Eliquis 5 mg twice daily.  CBC is up-to-date.      Late onset Alzheimer's dementia with behavioral disturbance (HCC)    Stable.  She has seen neurology and they did not recommend any medication.  They will schedule follow-up with neurology.      Lung nodule    The patient is due for follow-up.  CT scan ordered.      Relevant Orders   CT Chest Wo Contrast     Return in about 6 months (around 03/22/2022).  This visit occurred during the SARS-CoV-2 public health emergency.  Safety protocols were in place, including  screening questions prior to the visit, additional usage of staff PPE, and extensive cleaning of exam room while observing appropriate contact time as indicated for disinfecting solutions.    [name], MD [place]

## 2021-09-22 NOTE — Assessment & Plan Note (Signed)
The patient is due for follow-up.  CT scan ordered.

## 2021-09-22 NOTE — Assessment & Plan Note (Signed)
Stable.  She has seen neurology and they did not recommend any medication.  They will schedule follow-up with neurology.

## 2021-09-22 NOTE — Assessment & Plan Note (Signed)
Sinus rhythm today.  Amiodarone is managed by cardiology.  She will continue her Eliquis 5 mg twice daily.  CBC is up-to-date.

## 2021-09-22 NOTE — Patient Instructions (Signed)
Nice to see you. Someone will call you to schedule your CT scan. Please call to reschedule your neurology visit.

## 2021-09-22 NOTE — Assessment & Plan Note (Signed)
Continue risk factor control.  Patient is on Lipitor.

## 2021-09-22 NOTE — Assessment & Plan Note (Signed)
Chronic issue.  Stable.  She will continue singular 10 mg daily.  She can continue Flonase as needed.

## 2021-10-06 ENCOUNTER — Other Ambulatory Visit: Payer: Self-pay

## 2021-10-06 ENCOUNTER — Ambulatory Visit
Admission: RE | Admit: 2021-10-06 | Discharge: 2021-10-06 | Disposition: A | Discharge status: Home / Self Care | Payer: Medicare HMO | Source: Ambulatory Visit | Attending: Family Medicine | Admitting: Family Medicine

## 2021-10-06 DIAGNOSIS — R911 Solitary pulmonary nodule: Secondary | ICD-10-CM | POA: Insufficient documentation

## 2021-10-06 DIAGNOSIS — I700 Atherosclerosis of aorta: Secondary | ICD-10-CM | POA: Diagnosis not present

## 2021-10-06 DIAGNOSIS — J9811 Atelectasis: Secondary | ICD-10-CM | POA: Diagnosis not present

## 2021-11-19 ENCOUNTER — Other Ambulatory Visit: Payer: Self-pay | Admitting: Family Medicine

## 2021-11-19 DIAGNOSIS — J309 Allergic rhinitis, unspecified: Secondary | ICD-10-CM

## 2021-12-02 ENCOUNTER — Ambulatory Visit
Admission: EM | Admit: 2021-12-02 | Discharge: 2021-12-02 | Disposition: A | Discharge status: Home / Self Care | Payer: Medicare HMO | Attending: Emergency Medicine | Admitting: Emergency Medicine

## 2021-12-02 ENCOUNTER — Encounter: Payer: Self-pay | Admitting: Emergency Medicine

## 2021-12-02 DIAGNOSIS — J020 Streptococcal pharyngitis: Secondary | ICD-10-CM

## 2021-12-02 DIAGNOSIS — R051 Acute cough: Secondary | ICD-10-CM

## 2021-12-02 LAB — POCT RAPID STREP A (OFFICE): Rapid Strep A Screen: POSITIVE — AB

## 2021-12-02 MED ORDER — DOXYCYCLINE HYCLATE 100 MG PO CAPS: 100.0000 mg | ORAL_CAPSULE | Freq: Two times a day (BID) | ORAL | 0 refills | Status: AC

## 2021-12-02 NOTE — ED Triage Notes (Signed)
Pt here with sore throat and direct exposure x 2 days to strep positive person.  ?

## 2021-12-02 NOTE — ED Provider Notes (Signed)
?[facility] ? ? ? ?CSN: [id number] ?Arrival date & time: 12/02/21  1815 ? ? ?  ? ?History   ?Chief Complaint ?Chief Complaint  ?Patient presents with  ? Sore Throat  ? Cough  ? ? ?HPI ?Jill Snow is a 88 y.o. female.  Accompanied by her daughter, patient presents with sore throat and cough x4 days.  No fever, rash, shortness of breath, vomiting, diarrhea, or other symptoms.  Treatment at home with Tylenol.  Her medical history includes lung nodule, hypertension, atrial fibrillation, heart failure, right bundle blanch block, mitral regurgitation, Alzheimer's dementia. ? ?The history is provided by a relative, the patient and medical records.  ? ?Past Medical History:  ?Diagnosis Date  ? Atrial flutter ([place])   ? a. s/p successful TEE/DCCV in 2014; b. TEE 2014 showed EF 45-50%, mild bi-atrial enlargement, mod MR  ? Dyspnea   ? History of chicken pox   ? Hypercholesterolemia   ? Hypertension   ? Mitral regurgitation   ? a. echo 2014: EF 40-45%, mildly dilated RV, mod reduced RV systolic fxn, mod dilated LA, Mild to mod MR, mod TR, mildly elevated PASP  ? PAF (paroxysmal atrial fibrillation) ([place])   ? a. initial episode 2011; b. not on long term anticoagulation since 06/2013  ? RBBB (right bundle branch block)   ? Seasonal allergies   ? Sleep apnea   ? a. on CPAP  ? Thyroid nodule 05/19/2017  ? ? ?Patient Active Problem List  ? Diagnosis Date Noted  ? Late onset Alzheimer's dementia with behavioral disturbance ([place]) 06/03/2021  ? Urinary urgency 05/26/2021  ? Tremor of right hand 05/26/2021  ? Dry eyes 12/24/2020  ? Chronic systolic CHF (congestive heart failure) ([place]) 12/16/2020  ? Acute on chronic congestive heart failure ([place])   ? History of non anemic vitamin B12 deficiency 06/27/2018  ? Aortic atherosclerosis ([place]) 04/06/2018  ? Allergic rhinitis 05/19/2017  ? Lung nodule 12/10/2016  ? Low back pain 07/23/2016  ? Fatty liver 04/22/2016  ? Neuropathy 02/11/2016  ? Mitral regurgitation   ? Vitamin D  deficiency 08/29/2014  ? Obstructive sleep apnea on CPAP 03/07/2014  ? Atrial fibrillation ([place])   ? Hypercholesterolemia   ? RBBB (right bundle branch block)   ? ? ?Past Surgical History:  ?Procedure Laterality Date  ? ABDOMINAL HYSTERECTOMY    ? TONSILLECTOMY AND ADENOIDECTOMY  1947  ? tummy tuck    ? ? ?OB History   ?No obstetric history on file. ?  ? ? ? ?Home Medications   ? ?Prior to Admission medications   ?Medication Sig Start Date End Date Taking? Authorizing Provider  ?doxycycline (VIBRAMYCIN) 100 MG capsule Take 1 capsule (100 mg total) by mouth 2 (two) times daily for 10 days. 12/02/21 12/12/21 Yes Sharion Balloon, NP  ?amiodarone (PACERONE) 200 MG tablet Take 1 tablet (200 mg total) by mouth daily. 05/02/21   End, Harrell Gave, MD  ?apixaban (ELIQUIS) 5 MG TABS tablet Take 1 tablet (5 mg total) by mouth 2 (two) times daily. 06/10/21   Minna Merritts, MD  ?atorvastatin (LIPITOR) 40 MG tablet Take 1 tablet (40 mg total) by mouth daily. 05/02/21   Leone Haven, MD  ?CVS D3 50 MCG (2000 UT) CAPS TAKE 1 CAPSULE EVERY DAY ?Patient taking differently: Take 10,000 Units by mouth at bedtime. 12/07/18   Leone Haven, MD  ?fluticasone Asencion Islam) 50 MCG/ACT nasal spray USE 2 SPRAYS IN [facility] NOSTRIL EVERY DAY 04/08/20   Caryl Bis,  Angela Adam, MD  ?furosemide (LASIX) 20 MG tablet TAKE 1 TABLET DAILY. MAY TAKE 1 EXTRA TABLET DAILY AS NEEDED FOR WEIGHT GAIN OF 2 POUNDS OVERNIGHT OR 5 POUNDS IN 1 WEEK 05/02/21   End, Harrell Gave, MD  ?IBU 400 MG tablet TAKE ONE TABLET BY MOUTH EVERY 8 HOURS AS NEEDED FOR MILD PAIN ?Patient taking differently: Take 400 mg by mouth every 8 (eight) hours as needed for mild pain. 01/27/18   Leone Haven, MD  ?melatonin 5 MG TABS Take 5 mg by mouth at bedtime.    [provider]  ?montelukast (SINGULAIR) 10 MG tablet TAKE 1 TABLET EVERY DAY 11/19/21   Leone Haven, MD  ?potassium chloride SA (KLOR-CON) 20 MEQ tablet Take 1 tablet (20 mEq total) by mouth daily. 07/04/21    Minna Merritts, MD  ?vitamin E 400 UNIT capsule Take 400 Units by mouth daily.    [provider]  ? ? ?Family History ?Family History  ?Problem Relation Age of Onset  ? Stroke Mother   ? Heart attack Mother   ? Arthritis Mother   ? Hyperlipidemia Mother   ? Heart disease Mother   ? Diabetes Mother   ? Diabetes Sister   ? Cancer Daughter   ? Heart disease Daughter   ? Diabetes Daughter   ? Diabetes Son   ? Diabetes Maternal Grandmother   ? Diabetes Maternal Grandfather   ? ? ?Social History ?Social History  ? ?Tobacco Use  ? Smoking status: Never  ? Smokeless tobacco: Never  ?Vaping Use  ? Vaping Use: Never used  ?Substance Use Topics  ? Alcohol use: No  ? Drug use: No  ? ? ? ?Allergies   ?Augmentin [amoxicillin-pot clavulanate], Codeine, Crestor [rosuvastatin], and Penicillins ? ? ?Review of Systems ?Review of Systems  ?Constitutional:  Negative for chills and fever.  ?HENT:  Positive for sore throat. Negative for ear pain.   ?Respiratory:  Positive for cough. Negative for shortness of breath.   ?Gastrointestinal:  Negative for diarrhea and vomiting.  ?Skin:  Negative for color change and rash.  ?All other systems reviewed and are negative. ? ? ?Physical Exam ?Triage Vital Signs ?ED Triage Vitals [12/02/21 1829]  ?Enc Vitals Group  ?   BP 126/75  ?   Pulse Rate 67  ?   Resp 18  ?   Temp 98.4 ?F (36.9 ?C)  ?   Temp src   ?   SpO2 95 %  ?   Weight   ?   Height   ?   Head Circumference   ?   Peak Flow   ?   Pain Score   ?   Pain Loc   ?   Pain Edu?   ?   Excl. in [place]?   ? ?No data found. ? ?Updated Vital Signs ?BP 126/75   Pulse 67   Temp 98.4 ?F (36.9 ?C)   Resp 18   SpO2 95%  ? ?Visual Acuity ?Right Eye Distance:   ?Left Eye Distance:   ?Bilateral Distance:   ? ?Right Eye Near:   ?Left Eye Near:    ?Bilateral Near:    ? ?Physical Exam ?Vitals and nursing note reviewed.  ?Constitutional:   ?   General: She is not in acute distress. ?   Appearance: She is well-developed. She is not ill-appearing.   ?HENT:  ?   Right Ear: Tympanic membrane normal.  ?   Left Ear: Tympanic membrane normal.  ?  Nose: Nose normal.  ?   Mouth/Throat:  ?   Mouth: Mucous membranes are moist.  ?   Pharynx: Posterior oropharyngeal erythema present.  ?Cardiovascular:  ?   Rate and Rhythm: Normal rate and regular rhythm.  ?   Heart sounds: Normal heart sounds.  ?Pulmonary:  ?   Effort: Pulmonary effort is normal. No respiratory distress.  ?   Breath sounds: Normal breath sounds.  ?Musculoskeletal:  ?   Cervical back: Neck supple.  ?Skin: ?   General: Skin is warm and dry.  ?Neurological:  ?   Mental Status: She is alert.  ?Psychiatric:     ?   Mood and Affect: Mood normal.     ?   Behavior: Behavior normal.  ? ? ? ?UC Treatments / Results  ?Labs ?(all labs ordered are listed, but only abnormal results are displayed) ?Labs Reviewed  ?POCT RAPID STREP A (OFFICE) - Abnormal; Notable for the following components:  ?    Result Value  ? Rapid Strep A Screen Positive (*)   ? All other components within normal limits  ? ? ?EKG ? ? ?Radiology ?No results found. ? ?Procedures ?Procedures (including critical care time) ? ?Medications Ordered in UC ?Medications - No data to display ? ?Initial Impression / Assessment and Plan / UC Course  ?I have reviewed the triage vital signs and the nursing notes. ? ?Pertinent labs & imaging results that were available during my care of the patient were reviewed by me and considered in my medical decision making (see chart for details). ? ?  ?Strep pharyngitis, cough.  Patient is allergic to penicillin and Zithromax interacts with her amiodarone.  Treating with doxycycline.  Tylenol as needed for discomfort.  Instructed patient and her daughter to follow-up with her PCP if her symptoms are not improving.  They agree to plan of care. ? ?Final Clinical Impressions(s) / UC Diagnoses  ? ?Final diagnoses:  ?Strep pharyngitis  ?Acute cough  ? ? ? ?Discharge Instructions   ? ?  ?Take the doxycycline as directed.  Follow  up with your primary care provider if your symptoms are not improving.   ? ? ? ? ? ?ED Prescriptions   ? ? Medication Sig Dispense Auth. Provider  ? doxycycline (VIBRAMYCIN) 100 MG capsule Take 1 capsule (100 mg

## 2021-12-02 NOTE — Discharge Instructions (Addendum)
Take the doxycycline as directed.    Follow up with your primary care provider if your symptoms are not improving.    

## 2021-12-10 ENCOUNTER — Encounter: Payer: Self-pay | Admitting: Family Medicine

## 2021-12-10 ENCOUNTER — Ambulatory Visit (INDEPENDENT_AMBULATORY_CARE_PROVIDER_SITE_OTHER): Payer: Medicare HMO | Admitting: Family Medicine

## 2021-12-10 DIAGNOSIS — J020 Streptococcal pharyngitis: Secondary | ICD-10-CM | POA: Diagnosis not present

## 2021-12-10 MED ORDER — CEFDINIR 300 MG PO CAPS: 300.0000 mg | ORAL_CAPSULE | Freq: Two times a day (BID) | ORAL | 0 refills | Status: DC

## 2021-12-10 NOTE — Assessment & Plan Note (Signed)
Patient tested positive for strep pharyngitis.  She was treated with an inadequate antibiotic.  We will proceed with treatment with cefdinir.  She has no history of anaphylactic type reaction with penicillins and thus I think cefdinir will be okay to use.  She cannot take azithromycin given she was on amiodarone.  If she is not improving with the cefdinir she will let us know. ?

## 2021-12-10 NOTE — Progress Notes (Signed)
?[name], MD ?Phone: [phone] ? ?Jill Snow is a 88 y.o. female who presents today for same-day visit. ? ?Strep throat/cough: The patient's daughter provides most of the history.  Jill Snow reports her symptoms started early last week which would be 8 to 10 days ago.  Jill Snow has chronic allergy issues.  Jill Snow has some postnasal drip and cough.  Jill Snow had sore throat.  No shortness of breath.  Some wheezing.  Jill Snow came in contact with a relative that had strep throat and the patient ended up testing positive for strep throat.  Urgent care placed her on doxycycline.  The patient notes Jill Snow feels somewhat better though does still have a sore throat and has continued to cough some.  They have been using Coricidin for cough. ? ?Social History  ? ?Tobacco Use  ?Smoking Status Never  ?Smokeless Tobacco Never  ? ? ?Current Outpatient Medications on File Prior to Visit  ?Medication Sig Dispense Refill  ? amiodarone (PACERONE) 200 MG tablet Take 1 tablet (200 mg total) by mouth daily. 90 tablet 2  ? apixaban (ELIQUIS) 5 MG TABS tablet Take 1 tablet (5 mg total) by mouth 2 (two) times daily. 180 tablet 1  ? atorvastatin (LIPITOR) 40 MG tablet Take 1 tablet (40 mg total) by mouth daily. 90 tablet 1  ? CVS D3 50 MCG (2000 UT) CAPS TAKE 1 CAPSULE EVERY DAY (Patient taking differently: Take 10,000 Units by mouth at bedtime.) 100 capsule 3  ? doxycycline (VIBRAMYCIN) 100 MG capsule Take 1 capsule (100 mg total) by mouth 2 (two) times daily for 10 days. 20 capsule 0  ? fluticasone (FLONASE) 50 MCG/ACT nasal spray USE 2 SPRAYS IN EACH NOSTRIL EVERY DAY 48 g 1  ? furosemide (LASIX) 20 MG tablet TAKE 1 TABLET DAILY. MAY TAKE 1 EXTRA TABLET DAILY AS NEEDED FOR WEIGHT GAIN OF 2 POUNDS OVERNIGHT OR 5 POUNDS IN 1 WEEK 90 tablet 2  ? IBU 400 MG tablet TAKE ONE TABLET BY MOUTH EVERY 8 HOURS AS NEEDED FOR MILD PAIN (Patient taking differently: Take 400 mg by mouth every 8 (eight) hours as needed for mild pain.) 30 tablet 0  ? melatonin  5 MG TABS Take 5 mg by mouth at bedtime.    ? montelukast (SINGULAIR) 10 MG tablet TAKE 1 TABLET EVERY DAY 90 tablet 1  ? potassium chloride SA (KLOR-CON) 20 MEQ tablet Take 1 tablet (20 mEq total) by mouth daily. 90 tablet 3  ? vitamin E 400 UNIT capsule Take 400 Units by mouth daily.    ? ?No current facility-administered medications on file prior to visit.  ? ? ? ?ROS see history of present illness ? ?Objective ? ?Physical Exam ?Vitals:  ? 12/10/21 1209  ?BP: 110/80  ?Pulse: 66  ?Temp: 98.5 ?F (36.9 ?C)  ?SpO2: 98%  ? ? ?BP Readings from Last 3 Encounters:  ?12/10/21 110/80  ?12/02/21 126/75  ?09/22/21 120/70  ? ?Wt Readings from Last 3 Encounters:  ?12/10/21 145 lb 12.8 oz (66.1 kg)  ?09/22/21 151 lb (68.5 kg)  ?07/04/21 148 lb 6 oz (67.3 kg)  ? ? ?Physical Exam ?Constitutional:   ?   General: Jill Snow is not in acute distress. ?   Appearance: Jill Snow is not diaphoretic.  ?HENT:  ?   Mouth/Throat:  ?   Mouth: Mucous membranes are moist.  ?   Pharynx: Posterior oropharyngeal erythema (Mild erythema posterior oropharynx) present.  ?Cardiovascular:  ?   Rate and Rhythm: Normal rate and regular rhythm.  ?  Heart sounds: Normal heart sounds.  ?Pulmonary:  ?   Effort: Pulmonary effort is normal.  ?   Breath sounds: Normal breath sounds.  ?Skin: ?   General: Skin is warm and dry.  ?Neurological:  ?   Mental Status: Jill Snow is alert.  ? ? ? ?Assessment/Plan: Please see individual problem list. ? ?Problem List Items Addressed This Visit   ? ? Strep pharyngitis  ?  Patient tested positive for strep pharyngitis.  Jill Snow was treated with an inadequate antibiotic.  We will proceed with treatment with cefdinir.  Jill Snow has no history of anaphylactic type reaction with penicillins and thus I think cefdinir will be okay to use.  Jill Snow cannot take azithromycin given Jill Snow was on amiodarone.  If Jill Snow is not improving with the cefdinir Jill Snow will let us know. ? ?  ?  ? Relevant Medications  ? cefdinir (OMNICEF) 300 MG capsule  ? ? ?Return if symptoms  worsen or fail to improve. ? ?This visit occurred during the SARS-CoV-2 public health emergency.  Safety protocols were in place, including screening questions prior to the visit, additional usage of staff PPE, and extensive cleaning of exam room while observing appropriate contact time as indicated for disinfecting solutions.  ? ? ?[name], MD ?[place] ? ?

## 2021-12-10 NOTE — Patient Instructions (Signed)
Nice to see you.  ?If your symptoms return please let me know.  ?

## 2021-12-24 ENCOUNTER — Other Ambulatory Visit: Payer: Self-pay | Admitting: Cardiovascular Disease

## 2021-12-24 NOTE — Telephone Encounter (Signed)
Prescription refill request for Eliquis received. ?Indication: PAF ?Last office visit: 07/04/21  Johnny Bridge MD ?Scr: 0.87 on 05/26/21 ?Age: 88 ?Weight: 67.3kg ? ?Based on above findings Eliquis '5mg'$  twice daily is the appropriate dose.  Refill approved. ? ?

## 2021-12-24 NOTE — Telephone Encounter (Signed)
Refill Request.  
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
## 2022-01-05 ENCOUNTER — Ambulatory Visit (INDEPENDENT_AMBULATORY_CARE_PROVIDER_SITE_OTHER): Payer: Medicare HMO | Admitting: Cardiovascular Disease

## 2022-01-05 ENCOUNTER — Telehealth: Payer: Self-pay | Admitting: Family Medicine

## 2022-01-05 ENCOUNTER — Encounter: Payer: Self-pay | Admitting: Cardiovascular Disease

## 2022-01-05 VITALS — BP 138/68 | HR 60 | Ht 68.0 in | Wt 145.0 lb

## 2022-01-05 DIAGNOSIS — I700 Atherosclerosis of aorta: Secondary | ICD-10-CM | POA: Diagnosis not present

## 2022-01-05 DIAGNOSIS — I480 Paroxysmal atrial fibrillation: Secondary | ICD-10-CM

## 2022-01-05 DIAGNOSIS — I5022 Chronic systolic (congestive) heart failure: Secondary | ICD-10-CM | POA: Diagnosis not present

## 2022-01-05 DIAGNOSIS — I4892 Unspecified atrial flutter: Secondary | ICD-10-CM | POA: Diagnosis not present

## 2022-01-05 DIAGNOSIS — I2510 Atherosclerotic heart disease of native coronary artery without angina pectoris: Secondary | ICD-10-CM | POA: Diagnosis not present

## 2022-01-05 DIAGNOSIS — E782 Mixed hyperlipidemia: Secondary | ICD-10-CM | POA: Diagnosis not present

## 2022-01-05 DIAGNOSIS — I428 Other cardiomyopathies: Secondary | ICD-10-CM

## 2022-01-05 DIAGNOSIS — G4733 Obstructive sleep apnea (adult) (pediatric): Secondary | ICD-10-CM | POA: Diagnosis not present

## 2022-01-05 DIAGNOSIS — I10 Essential (primary) hypertension: Secondary | ICD-10-CM | POA: Diagnosis not present

## 2022-01-05 DIAGNOSIS — Z9989 Dependence on other enabling machines and devices: Secondary | ICD-10-CM

## 2022-01-05 DIAGNOSIS — E7800 Pure hypercholesterolemia, unspecified: Secondary | ICD-10-CM

## 2022-01-05 MED ORDER — OMEPRAZOLE 20 MG PO CPDR: 20.0000 mg | DELAYED_RELEASE_CAPSULE | Freq: Every day | ORAL | 11 refills | Status: DC

## 2022-01-05 NOTE — Telephone Encounter (Signed)
I called and spoke with the patients daughter and the patient is scheduled for labs tomorrow.  Mariana Wiederholt,cma

## 2022-01-05 NOTE — Telephone Encounter (Signed)
-----   Message from Minna Merritts, MD sent at 01/05/2022  9:25 AM EDT ----- I was going to order a CBC as she is on Eliquis and a basic metabolic panel for low potassium but they prefer to have it done in your office.  Would you be able to order for me? Thx TG

## 2022-01-05 NOTE — Telephone Encounter (Signed)
This patient needs labs done for her cardiologist.  I placed orders as the patient would like to have them done in our office.  Can you get her scheduled?  Thanks.

## 2022-01-05 NOTE — Patient Instructions (Addendum)
Medication Instructions:  Omeprazole 20 mg once a day  If you need a refill on your cardiac medications before your next appointment, please call your pharmacy.   Lab work:  CBC, BMP at Dr. Tharon Aquas office  Testing/Procedures: No new testing needed  Follow-Up: At [facility], you and your health needs are our priority.  As part of our continuing mission to provide you with exceptional heart care, we have created designated Provider Care Teams.  These Care Teams include your primary Cardiologist (physician) and Advanced Practice Providers (APPs -  Physician Assistants and Nurse Practitioners) who all work together to provide you with the care you need, when you need it.  You will need a follow up appointment in 12 months  Providers on your designated Care Team:   Murray Hodgkins, NP Christell Faith, PA-C Cadence Kathlen Mody, [place]  COVID-19 Vaccine Information can be found at: [email or web address].uk For questions related to vaccine distribution or appointments, please email [email or web address] or call [phone].

## 2022-01-06 ENCOUNTER — Other Ambulatory Visit (INDEPENDENT_AMBULATORY_CARE_PROVIDER_SITE_OTHER): Payer: Medicare HMO

## 2022-01-06 DIAGNOSIS — I5022 Chronic systolic (congestive) heart failure: Secondary | ICD-10-CM | POA: Diagnosis not present

## 2022-01-06 DIAGNOSIS — I480 Paroxysmal atrial fibrillation: Secondary | ICD-10-CM | POA: Diagnosis not present

## 2022-01-06 LAB — BASIC METABOLIC PANEL
BUN: 10 mg/dL (ref 6–23)
CO2: 29 mEq/L (ref 19–32)
Calcium: 9.6 mg/dL (ref 8.4–10.5)
Chloride: 103 mEq/L (ref 96–112)
Creatinine, Ser: 0.86 mg/dL (ref 0.40–1.20)
GFR: 61.29 mL/min (ref >60.00)
Glucose, Bld: 96 mg/dL (ref 70–99)
Potassium: 3.6 mEq/L (ref 3.5–5.1)
Sodium: 140 mEq/L (ref 135–145)

## 2022-01-06 LAB — CBC
HCT: 41.5 % (ref 36.0–46.0)
Hemoglobin: 13.9 g/dL (ref 12.0–15.0)
MCHC: 33.4 g/dL (ref 30.0–36.0)
MCV: 97.8 fl (ref 78.0–100.0)
Platelets: 195 10*3/uL (ref 150.0–400.0)
RBC: 4.24 Mil/uL (ref 3.87–5.11)
RDW: 13.4 % (ref 11.5–15.5)
WBC: 5 10*3/uL (ref 4.0–10.5)

## 2022-02-22 IMAGING — DX DG CHEST 2V
2 series · 2 of 2 positions shown · non-contrast
Comparison: Chest x-ray 03/12/2017.

CLINICAL DATA: 86-year-old female with history of pneumonia.

EXAM:
CHEST - 2 VIEW

[chest pa]
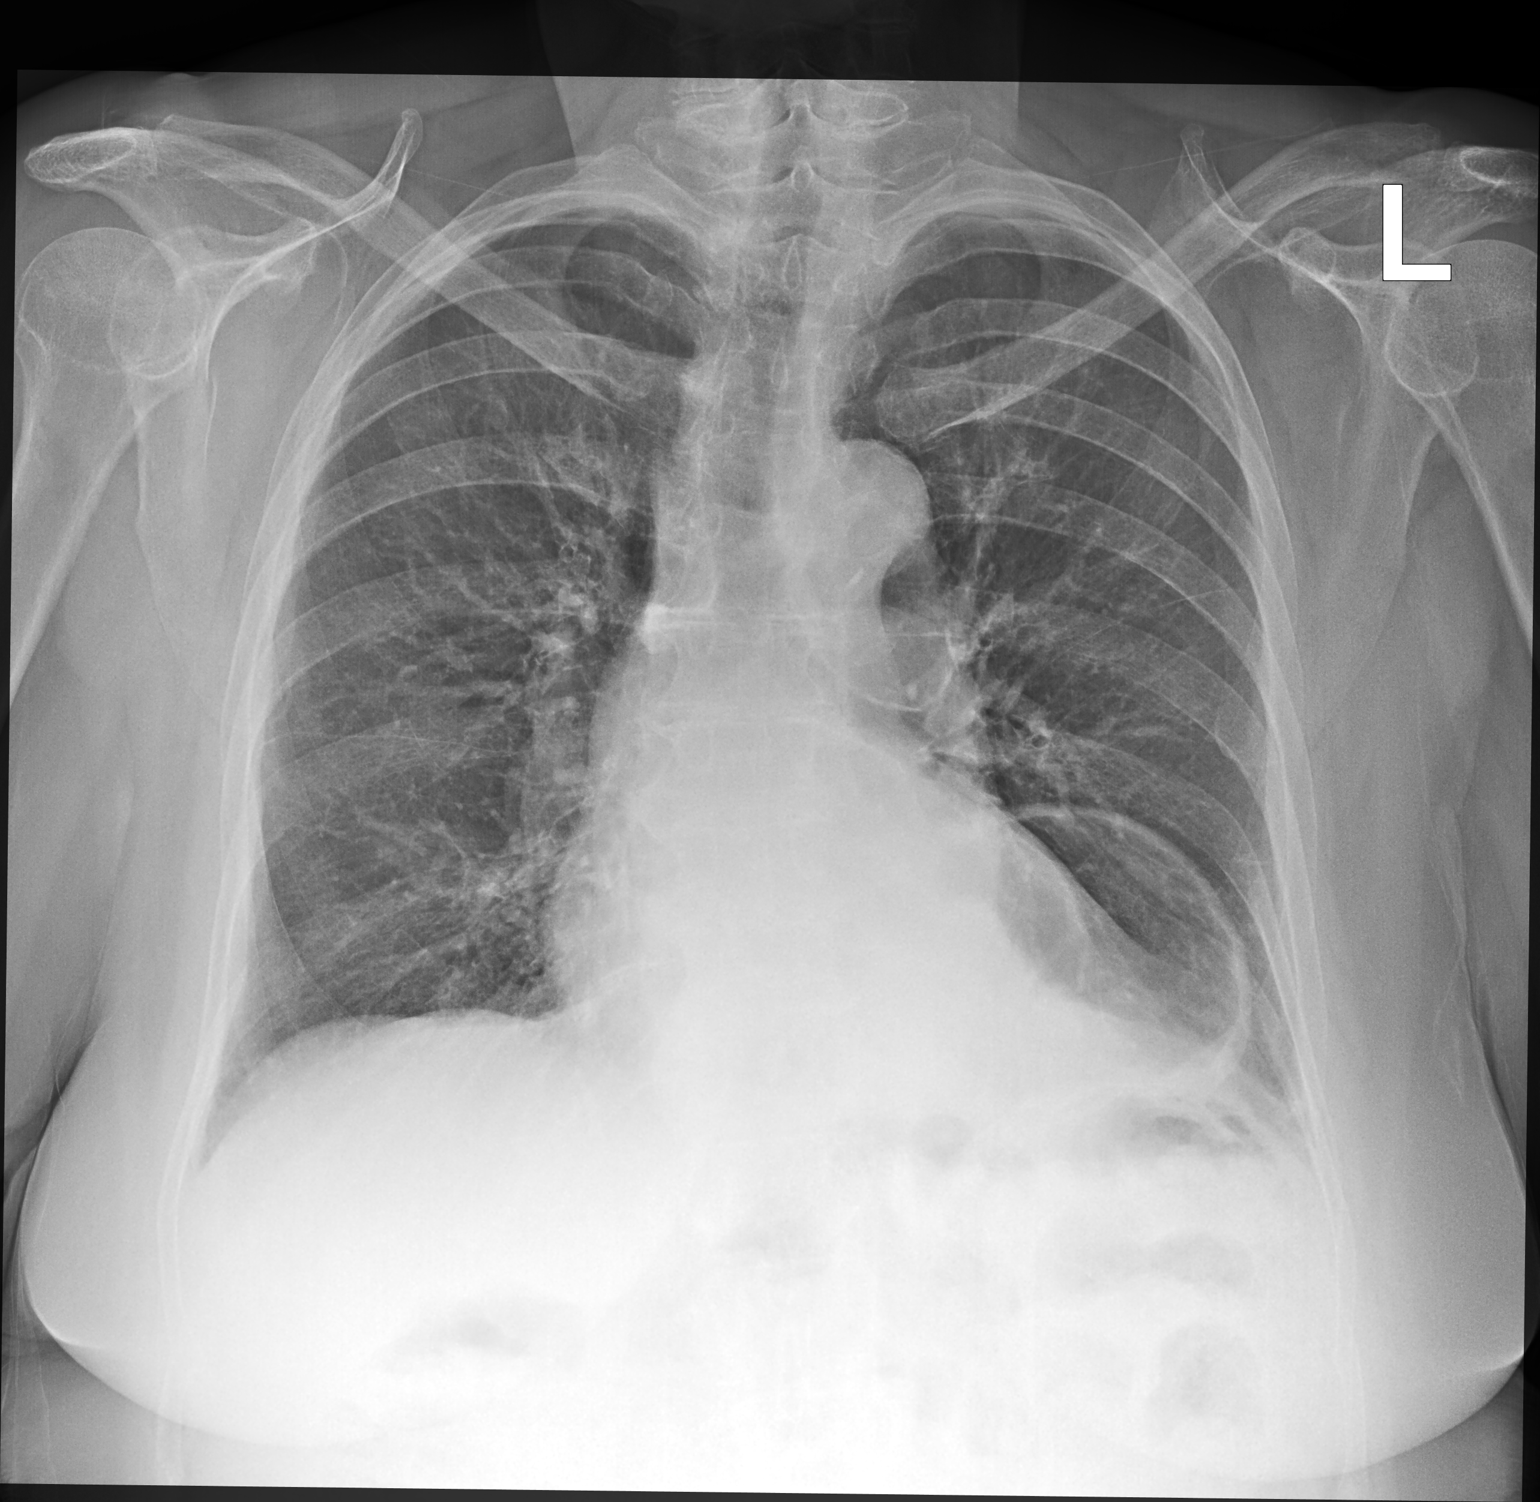

[chest lat]
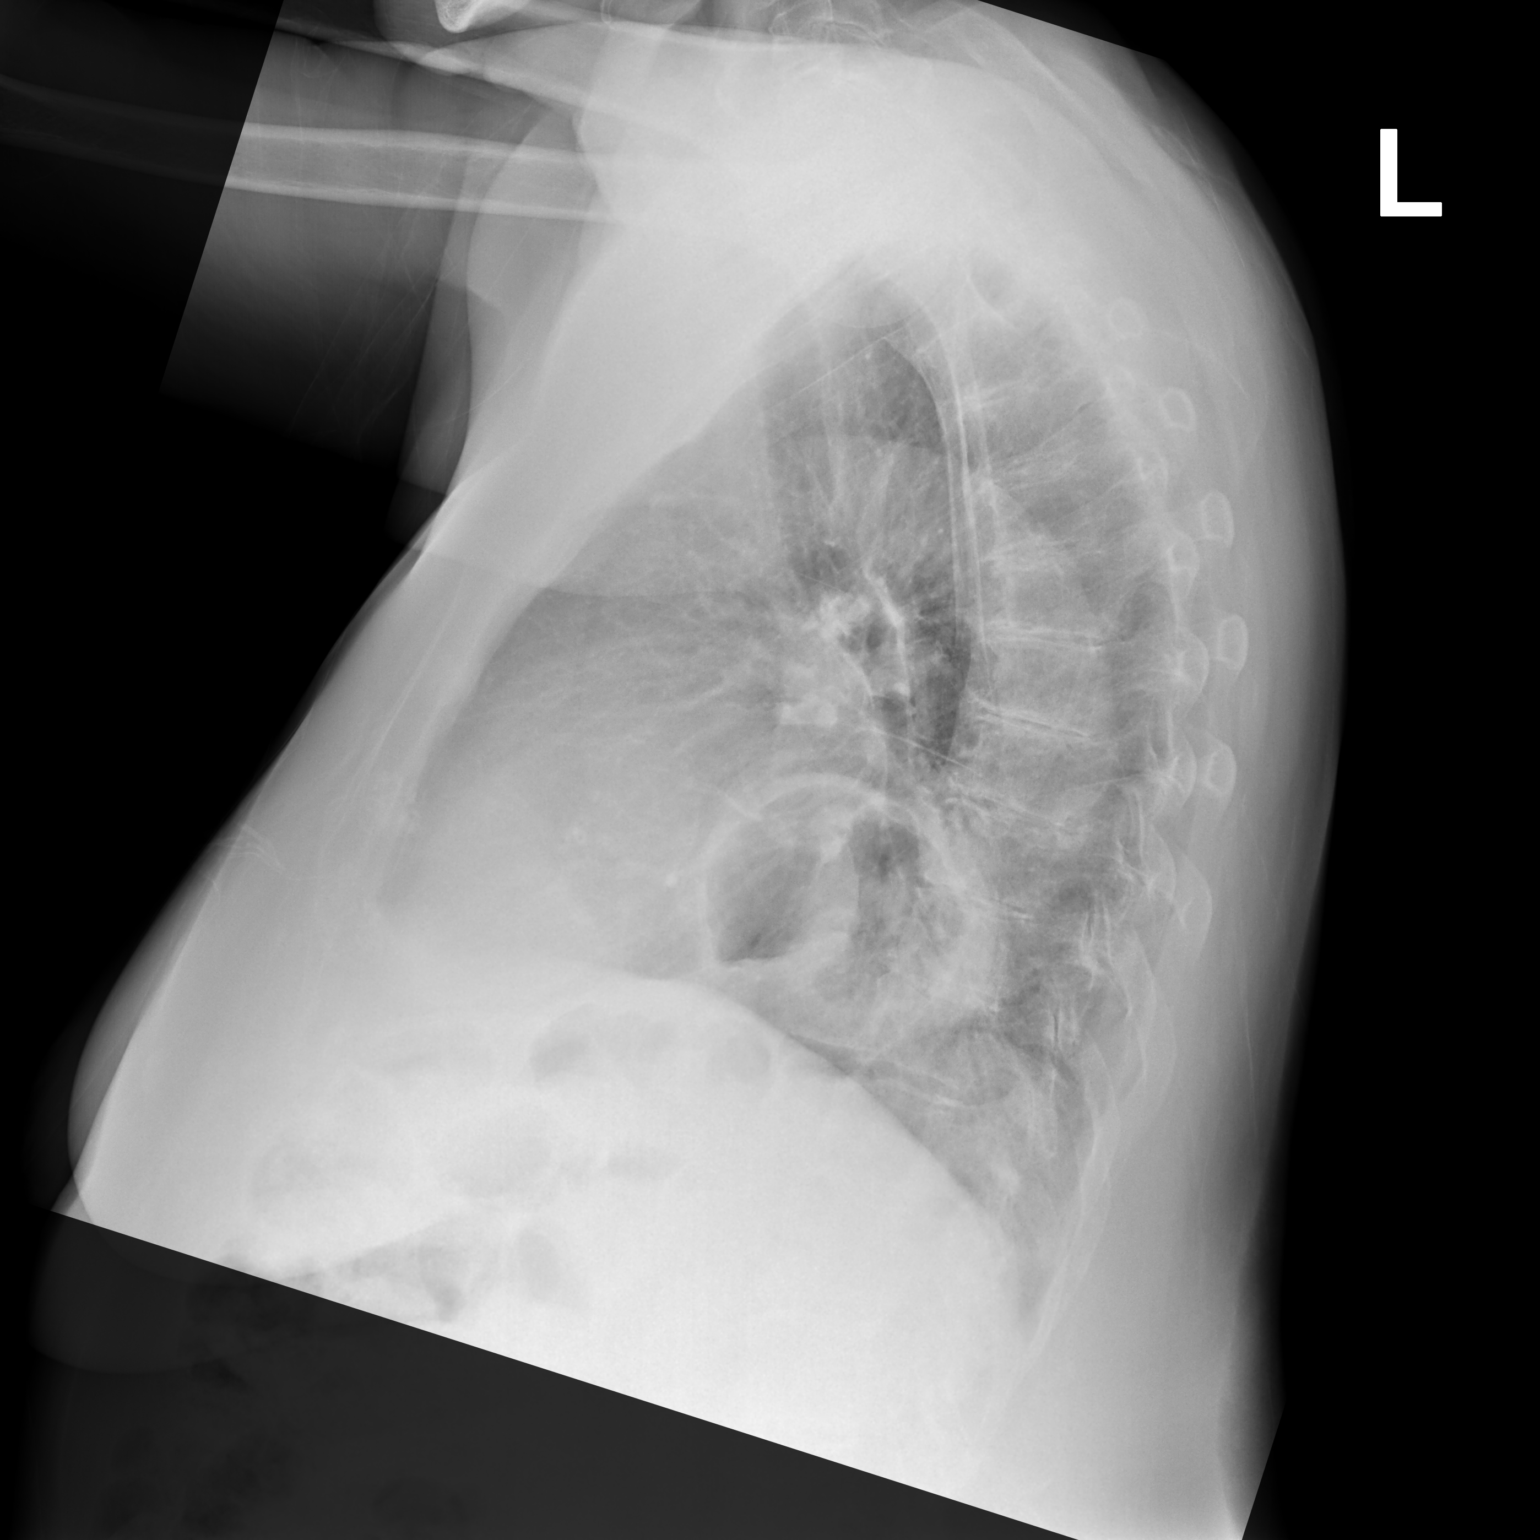

[2 of 2 positions shown; findings below may reference images not displayed]

FINDINGS: Lung volumes are normal. No consolidative airspace disease. No
pleural effusions. No pneumothorax. No pulmonary nodule or mass
noted. Large hiatal hernia. Pulmonary vasculature and the
cardiomediastinal silhouette are otherwise within normal limits.
Atherosclerosis in the thoracic aorta.
IMPRESSION: 1. No radiographic evidence of acute cardiopulmonary disease.
2. Aortic atherosclerosis.
3. Large hiatal hernia.

## 2022-03-11 ENCOUNTER — Telehealth: Payer: Medicare HMO | Admitting: Physician Assistant

## 2022-03-11 DIAGNOSIS — L03114 Cellulitis of left upper limb: Secondary | ICD-10-CM | POA: Diagnosis not present

## 2022-03-11 MED ORDER — CEPHALEXIN 500 MG PO CAPS: 500.0000 mg | ORAL_CAPSULE | Freq: Four times a day (QID) | ORAL | 0 refills | Status: DC

## 2022-03-11 NOTE — Progress Notes (Signed)
E Visit for Cellulitis  We are sorry that you are not feeling well. Here is how we plan to help!  Based on what you shared with me it looks like you have cellulitis.  Cellulitis looks like areas of skin redness, swelling, and warmth; it develops as a result of bacteria entering under the skin. Little red spots and/or bleeding can be seen in skin, and tiny surface sacs containing fluid can occur. Fever can be present. Cellulitis is almost always on one side of a body, and the lower limbs are the most common site of involvement.   I have prescribed:  Keflex '500mg'$  take one by mouth four times a day for 5 days  HOME CARE:  Take your medications as ordered and take all of them, even if the skin irritation appears to be healing.   GET HELP RIGHT AWAY IF:  Symptoms that don't begin to go away within 48 hours. Severe redness persists or worsens If the area turns color, spreads or swells. If it blisters and opens, develops yellow-brown crust or bleeds. You develop a fever or chills. If the pain increases or becomes unbearable.  Are unable to keep fluids and food down.  MAKE SURE YOU   Understand these instructions. Will watch your condition. Will get help right away if you are not doing well or get worse.  Thank you for choosing an e-visit.  Your e-visit answers were reviewed by a board certified advanced clinical practitioner to complete your personal care plan. Depending upon the condition, your plan could have included both over the counter or prescription medications.  Please review your pharmacy choice. Make sure the pharmacy is open so you can pick up prescription now. If there is a problem, you may contact your provider through [employer] and have the prescription routed to another pharmacy.  Your safety is important to [name]. If you have drug allergies check your prescription carefully.   For the next 24 hours you can use MyChart to ask questions about today's visit, request a  non-urgent call back, or ask for a work or school excuse. You will get an email in the next two days asking about your experience. I hope that your e-visit has been valuable and will speed your recovery.  I provided 5 minutes of non face-to-face time during this encounter for chart review and documentation.

## 2022-03-23 ENCOUNTER — Encounter: Payer: Self-pay | Admitting: Family Medicine

## 2022-03-23 ENCOUNTER — Ambulatory Visit (INDEPENDENT_AMBULATORY_CARE_PROVIDER_SITE_OTHER): Payer: Medicare HMO | Admitting: Family Medicine

## 2022-03-23 DIAGNOSIS — G301 Alzheimer's disease with late onset: Secondary | ICD-10-CM

## 2022-03-23 DIAGNOSIS — I480 Paroxysmal atrial fibrillation: Secondary | ICD-10-CM

## 2022-03-23 DIAGNOSIS — F02818 Dementia in other diseases classified elsewhere, unspecified severity, with other behavioral disturbance: Secondary | ICD-10-CM | POA: Diagnosis not present

## 2022-03-23 DIAGNOSIS — L0390 Cellulitis, unspecified: Secondary | ICD-10-CM | POA: Insufficient documentation

## 2022-03-23 DIAGNOSIS — L03114 Cellulitis of left upper limb: Secondary | ICD-10-CM

## 2022-03-23 NOTE — Assessment & Plan Note (Signed)
They will contact neurology to arrange follow-up for this and her tremor.

## 2022-03-23 NOTE — Progress Notes (Signed)
[name], MD Phone: [phone]  Jill Snow is a 88 y.o. female who presents today for follow-up.  Cellulitis: Patient's daughter did an E-visit for her last week.  They were treated with Keflex which she has finished.  The cellulitis on her left forearm has resolved.  She notes no fever.  She does not feel ill.  She occasionally gets scratched by her cat.  Dementia: Patient's daughter notes that this has worsened some though is still manageable. She still has a tremor though this has not changed. They have not scheduled follow-up with neurology.   Afib: taking eliquis and amiodarone. No CP, palpitations, or bleeding issues.   Social History   Tobacco Use  Smoking Status Never  Smokeless Tobacco Never    Current Outpatient Medications on File Prior to Visit  Medication Sig Dispense Refill   amiodarone (PACERONE) 200 MG tablet Take 1 tablet (200 mg total) by mouth daily. 90 tablet 2   atorvastatin (LIPITOR) 40 MG tablet Take 1 tablet (40 mg total) by mouth daily. 90 tablet 1   cephALEXin (KEFLEX) 500 MG capsule Take 1 capsule (500 mg total) by mouth 4 (four) times daily. 20 capsule 0   CVS D3 50 MCG (2000 UT) CAPS TAKE 1 CAPSULE EVERY DAY (Patient taking differently: Take 10,000 Units by mouth at bedtime.) 100 capsule 3   ELIQUIS 5 MG TABS tablet TAKE 1 TABLET BY MOUTH TWICE A DAY 60 tablet 5   fluticasone (FLONASE) 50 MCG/ACT nasal spray USE 2 SPRAYS IN EACH NOSTRIL EVERY DAY 48 g 1   furosemide (LASIX) 20 MG tablet TAKE 1 TABLET DAILY. MAY TAKE 1 EXTRA TABLET DAILY AS NEEDED FOR WEIGHT GAIN OF 2 POUNDS OVERNIGHT OR 5 POUNDS IN 1 WEEK 90 tablet 2   IBU 400 MG tablet TAKE ONE TABLET BY MOUTH EVERY 8 HOURS AS NEEDED FOR MILD PAIN (Patient taking differently: Take 400 mg by mouth every 8 (eight) hours as needed for mild pain.) 30 tablet 0   melatonin 5 MG TABS Take 5 mg by mouth at bedtime.     montelukast (SINGULAIR) 10 MG tablet TAKE 1 TABLET EVERY DAY 90 tablet 1    omeprazole (PRILOSEC) 20 MG capsule Take 1 capsule (20 mg total) by mouth daily. 30 capsule 11   potassium chloride SA (KLOR-CON) 20 MEQ tablet Take 1 tablet (20 mEq total) by mouth daily. 90 tablet 3   vitamin E 400 UNIT capsule Take 400 Units by mouth daily.     No current facility-administered medications on file prior to visit.     ROS see history of present illness  Objective  Physical Exam Vitals:   03/23/22 1418 03/23/22 1441  BP: 90/60   Pulse: 68   Temp: 98.1 F (36.7 C)   SpO2: 90% 99%    BP Readings from Last 3 Encounters:  03/23/22 90/60  01/05/22 138/68  12/10/21 110/80   Wt Readings from Last 3 Encounters:  03/23/22 144 lb (65.3 kg)  01/05/22 145 lb (65.8 kg)  12/10/21 145 lb 12.8 oz (66.1 kg)    Physical Exam Constitutional:      General: She is not in acute distress. Cardiovascular:     Rate and Rhythm: Normal rate and regular rhythm.  Skin:    Comments: No evidence of cellulitis on her forearms, scattered scratches on forearms with no tenderness  Neurological:     Mental Status: She is alert.      Assessment/Plan: Please see individual problem list.  Problem List Items Addressed This Visit     Atrial fibrillation ([place]) (Chronic)    Sinus rhythm. She will continue eliquis 5 mg BID. Amiodarone is managed by cardiology.       Late onset Alzheimer's dementia with behavioral disturbance ([place]) (Chronic)    They will contact neurology to arrange follow-up for this and her tremor.       Cellulitis    Resolved.  Discussed avoiding getting scratched by her cat.  Monitor for any recurrent signs of infection.       Return in about 6 months (around 09/23/2022).   [name], MD [place]

## 2022-03-23 NOTE — Patient Instructions (Signed)
Nice to see you. Please make sure you call neurology for follow-up.

## 2022-03-23 NOTE — Assessment & Plan Note (Signed)
Resolved.  Discussed avoiding getting scratched by her cat.  Monitor for any recurrent signs of infection.

## 2022-03-23 NOTE — Assessment & Plan Note (Signed)
Sinus rhythm. She will continue eliquis 5 mg BID. Amiodarone is managed by cardiology.

## 2022-04-04 IMAGING — CT CT ABD-PELV W/ CM
2 of 5 series · 15 of 46 positions shown, 17 images · IV contrast (omnipaque)
Comparison: November 30, 2016.

CLINICAL DATA: Pulmonary nodule, hydronephrosis.

EXAM:
CT CHEST, ABDOMEN, AND PELVIS WITH CONTRAST
TECHNIQUE: Multidetector CT imaging of the chest, abdomen and pelvis was
performed following the standard protocol during bolus
administration of intravenous contrast.
CONTRAST:  100mL OMNIPAQUE IOHEXOL 300 MG/ML  SOLN

[Series 2: abd pelvis 5.00 · axial · 0.84mm/px · z∈[-1628,-1213]mm · 12 of 93 slices shown, 14 images]
[im 5/93  soft-tissue]
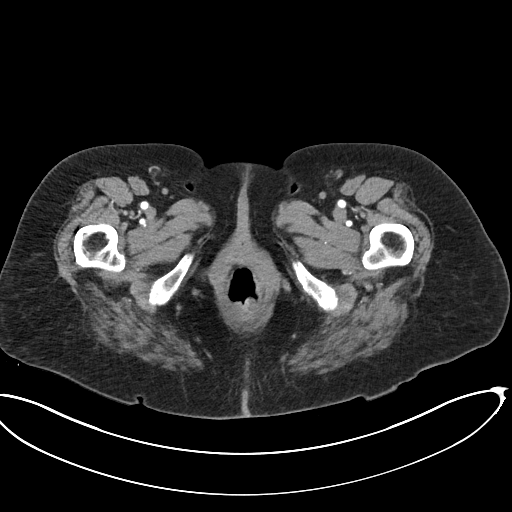
[im 5/93  bone]
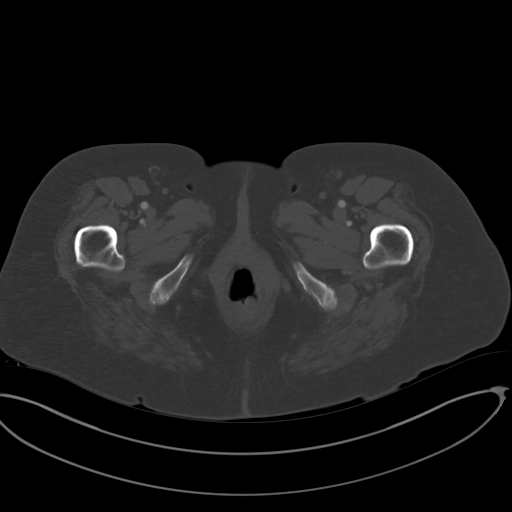
[im 15/93  soft-tissue]
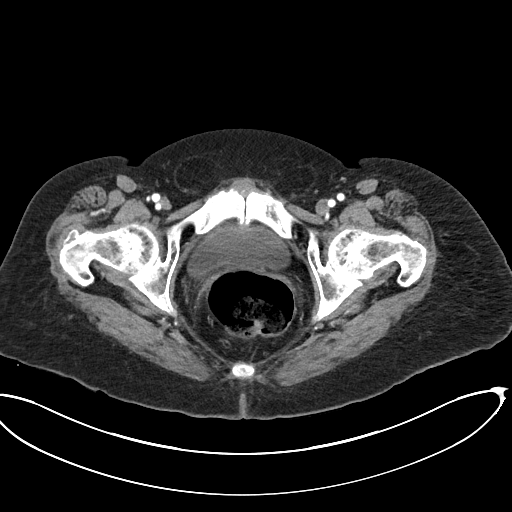
[im 20/93  soft-tissue]
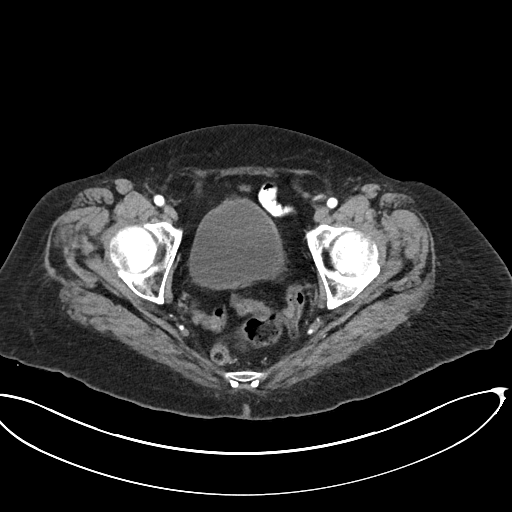
[im 30/93  soft-tissue]
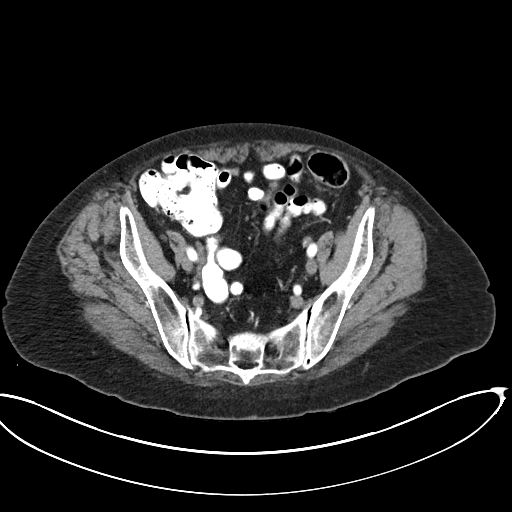
[im 34/93  soft-tissue]
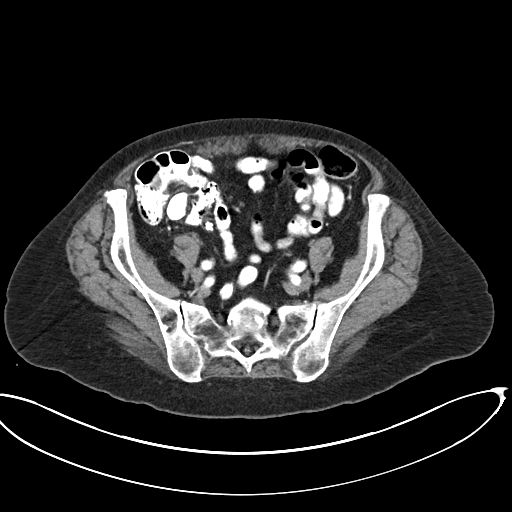
[im 44/93  soft-tissue]
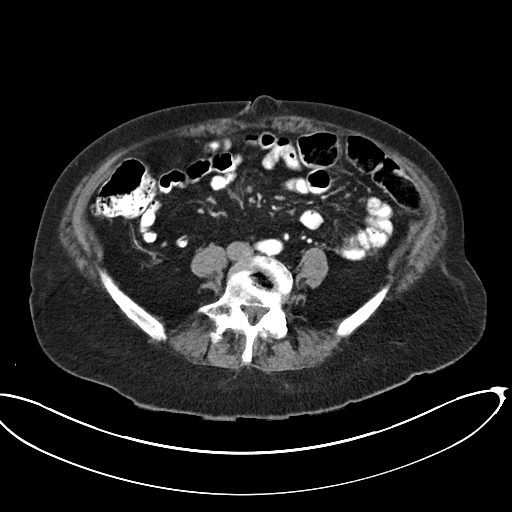
[im 49/93  soft-tissue]
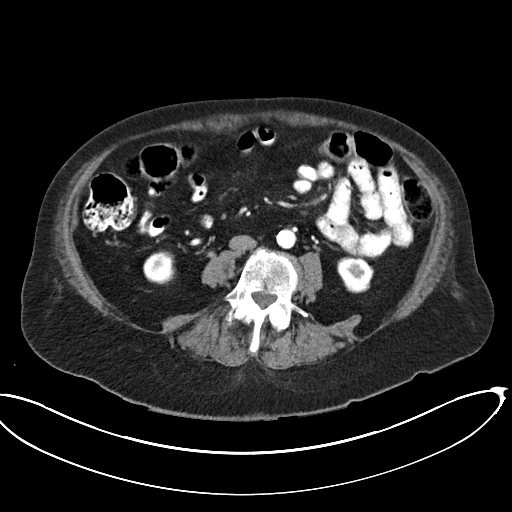
[im 59/93  soft-tissue]
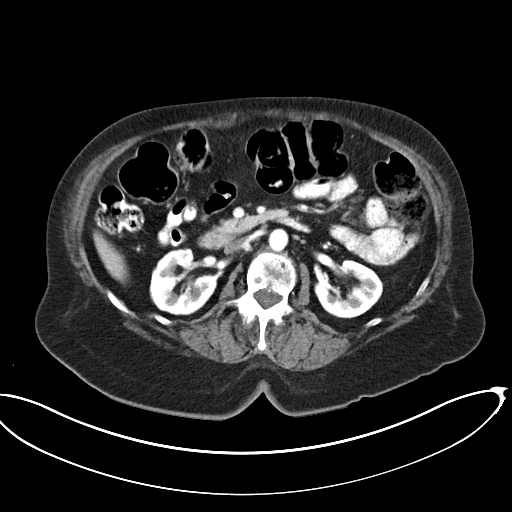
[im 63/93  soft-tissue]
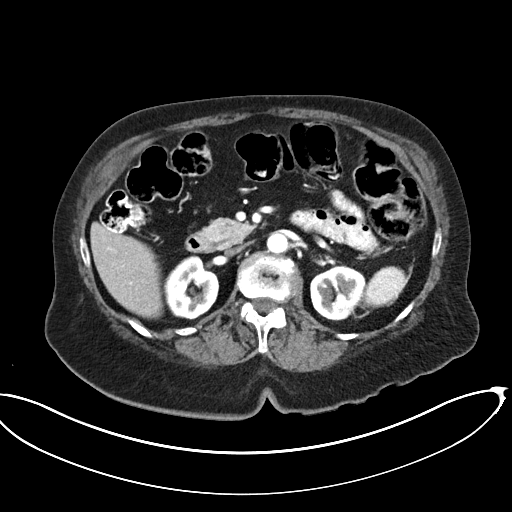
[im 63/93  bone]
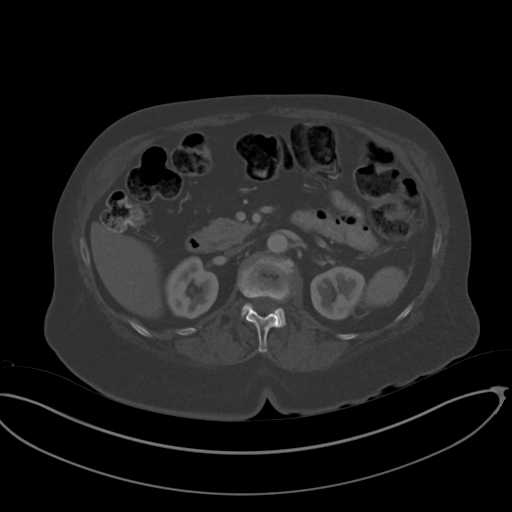
[im 73/93  soft-tissue]
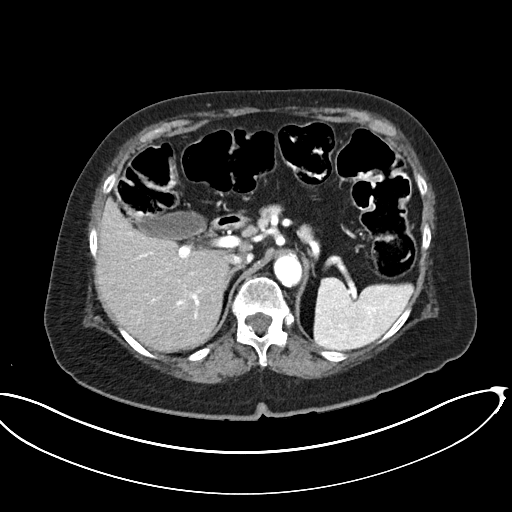
[im 78/93  soft-tissue]
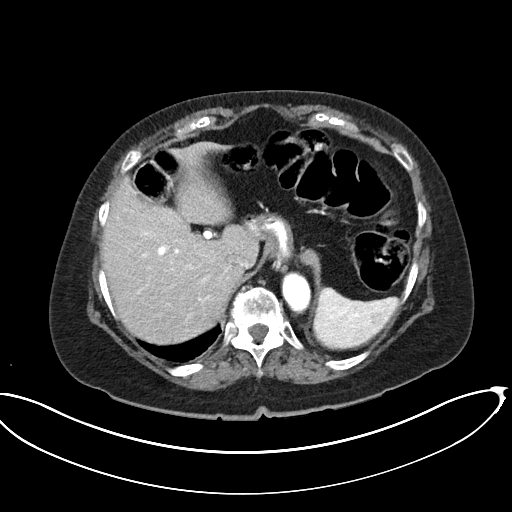
[im 88/93  soft-tissue]
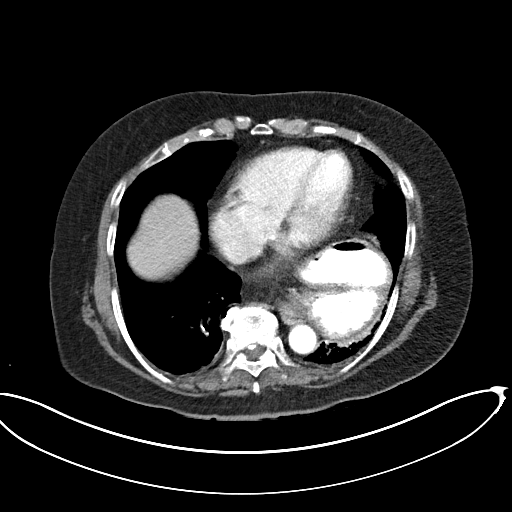

[Series 4: coronals abd pelvis 2.00 cor · coronal · 0.75mm/px · 3 of 141 slices shown]
[im 47/141  soft-tissue]
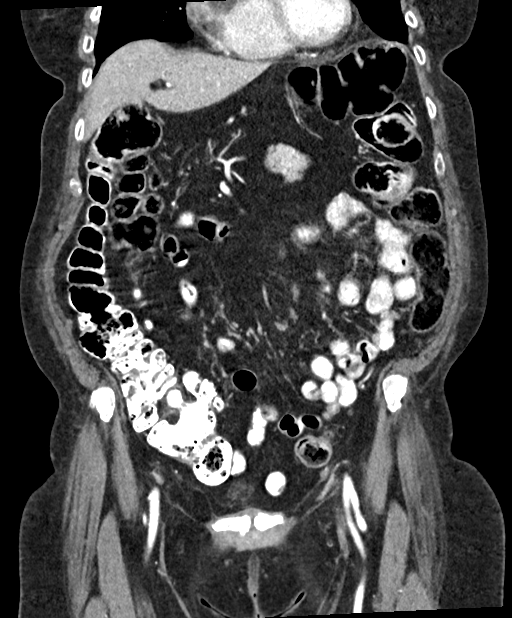
[im 63/141  soft-tissue]
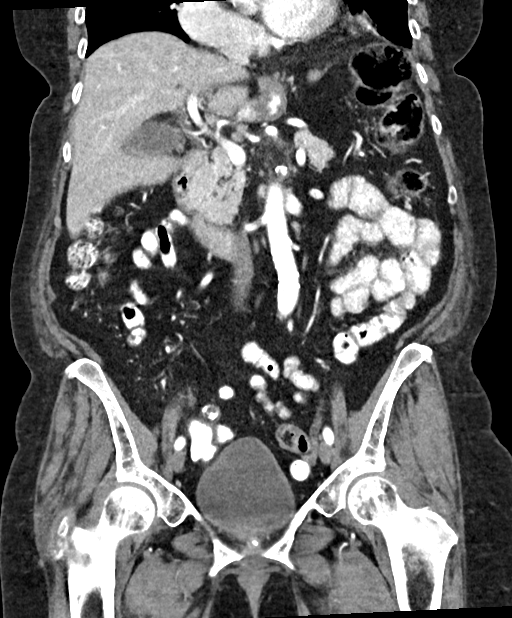
[im 78/141  soft-tissue]
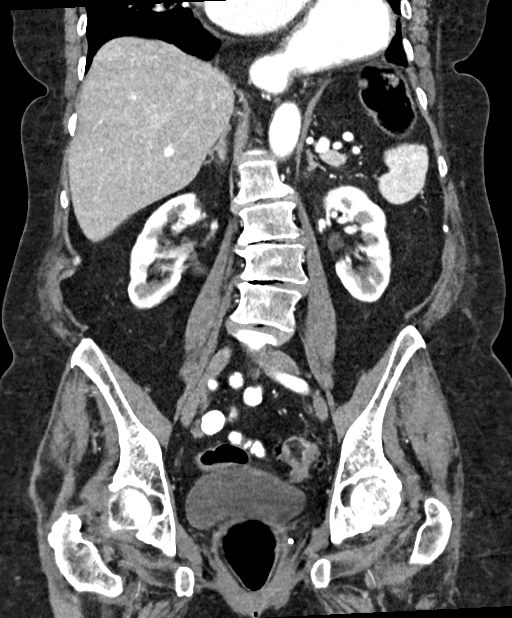

[15 of 46 positions shown; findings below may reference images not displayed]

FINDINGS: CT CHEST FINDINGS

Cardiovascular: Atherosclerosis of thoracic aorta is noted without
aneurysm formation. Normal cardiac size. No pericardial effusion.
Coronary artery calcifications are noted.

Mediastinum/Nodes: Stable 3.2 cm left thyroid nodule is noted which
reportedly has been previously biopsied. Large sliding-type hiatal
hernia is noted. Stable calcified right hilar adenopathy is noted
consistent with prior granulomatous disease.

Lungs/Pleura: No pneumothorax or pleural effusion is noted. Stable 6
mm nodule is noted in right upper lobe best seen on image number 34
of series 3, which can be considered benign at this point with no
further follow-up required. Stable 11 x 9 mm ground-glass opacity is
noted in left upper lobe best seen on image number 31 of series 3.
Minimal left basilar subsegmental atelectasis is noted. No acute
abnormality is noted.

Musculoskeletal: No chest wall mass or suspicious bone lesions
identified.

CT ABDOMEN PELVIS FINDINGS

Hepatobiliary: No focal liver abnormality is seen. No gallstones,
gallbladder wall thickening, or biliary dilatation.

Pancreas: Unremarkable. No pancreatic ductal dilatation or
surrounding inflammatory changes.

Spleen: Normal in size without focal abnormality.

Adrenals/Urinary Tract: Adrenal glands are unremarkable. Kidneys are
normal, without renal calculi, focal lesion, or hydronephrosis.
Bladder is unremarkable.

Stomach/Bowel: Large sliding-type hiatal hernia the stomach is noted
as described above. There is no evidence of bowel obstruction or
inflammation. The appendix is not visualized, but no inflammation is
noted in the right lower quadrant.

Vascular/Lymphatic: Aortic atherosclerosis. No enlarged abdominal or
pelvic lymph nodes.

Reproductive: Status post hysterectomy. No adnexal masses.

Other: Mild bilateral fat containing inguinal hernias are noted. No
ascites is noted. Small fat containing periumbilical hernia is
noted.

Musculoskeletal: No acute or significant osseous findings.
IMPRESSION: 1. Coronary artery calcifications are noted suggesting coronary
artery disease.
2. Large sliding-type hiatal hernia is noted.
3. Stable 6 mm nodule is noted in right upper lobe which can be
considered benign at this point with no further follow-up required.
4. Stable 11 x 9 mm ground-glass opacity is noted in left upper
lobe. Follow-up unenhanced chest CT in 12 months is recommended to
ensure stability and rule [DATE]. Stable 3.2 cm left thyroid nodule is noted which has been
previously biopsied. Not clinically significant; no follow-up
imaging recommended. (Ref: [HOSPITAL]. [DATE]):
6. No other significant abnormality seen in the chest, abdomen or
pelvis.

Aortic Atherosclerosis ([id number]-BV4.4).

## 2022-05-17 IMAGING — DX DG CHEST 1V PORT
1 series · 1 of 1 positions shown · non-contrast
Comparison: Portable exam 3434 hours compared to 01/30/2020

CLINICAL DATA: Shortness of breath, woke up feeling funny, history
UTI, atrial fibrillation, hypertension

EXAM:
PORTABLE CHEST 1 VIEW

[chest ap]
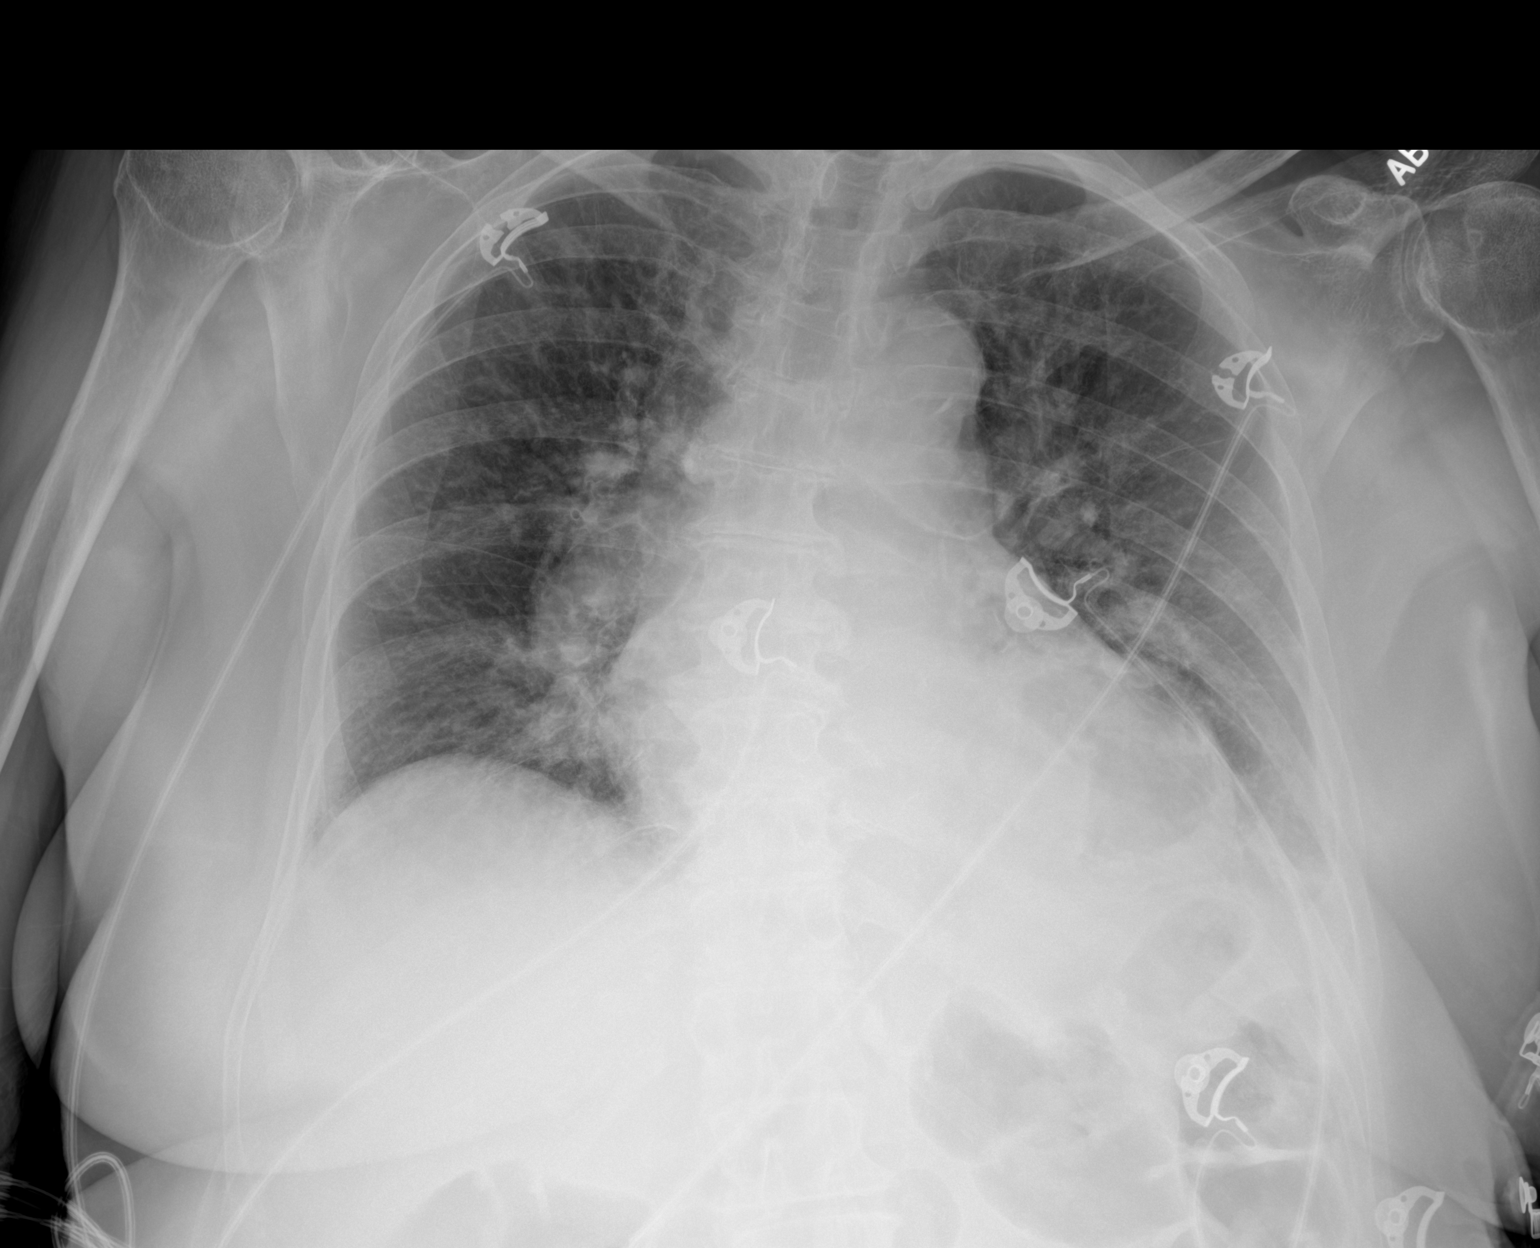

[1 of 1 positions shown; findings below may reference images not displayed]

FINDINGS: Enlargement of cardiac silhouette with slight vascular congestion.

Mediastinal contours normal.

Atherosclerotic calcification aorta.

Peribronchial thickening and accentuated interstitial markings in
the perihilar regions question mild pulmonary edema.

Subsegmental atelectasis at LEFT base.

Remaining lungs clear.

No gross pleural effusion or pneumothorax.

Multilevel endplate spur formation thoracic spine and osseous
demineralization noted.
IMPRESSION: Enlargement of cardiac silhouette with pulmonary vascular congestion
and question minimal perihilar edema.

Subsegmental atelectasis LEFT base.

## 2022-05-21 ENCOUNTER — Telehealth: Payer: Self-pay | Admitting: Family Medicine

## 2022-05-21 ENCOUNTER — Telehealth: Payer: Self-pay | Admitting: Cardiovascular Disease

## 2022-05-21 ENCOUNTER — Other Ambulatory Visit: Payer: Self-pay

## 2022-05-21 DIAGNOSIS — E7800 Pure hypercholesterolemia, unspecified: Secondary | ICD-10-CM

## 2022-05-21 DIAGNOSIS — J309 Allergic rhinitis, unspecified: Secondary | ICD-10-CM

## 2022-05-21 MED ORDER — MONTELUKAST SODIUM 10 MG PO TABS: 10.0000 mg | ORAL_TABLET | Freq: Every day | ORAL | 1 refills | Status: DC

## 2022-05-21 MED ORDER — AMIODARONE HCL 200 MG PO TABS: 200.0000 mg | ORAL_TABLET | Freq: Every day | ORAL | 3 refills | Status: DC

## 2022-05-21 MED ORDER — FUROSEMIDE 20 MG PO TABS: ORAL_TABLET | ORAL | 3 refills | Status: DC

## 2022-05-21 MED ORDER — ATORVASTATIN CALCIUM 40 MG PO TABS: 40.0000 mg | ORAL_TABLET | Freq: Every day | ORAL | 1 refills | Status: DC

## 2022-05-21 NOTE — Telephone Encounter (Signed)
Patient is requesting the following refills; montelukast (SINGULAIR) 10 MG tablet, atorvastatin (LIPITOR) 40 MG tablet. Patient is changing her pharmacy to [facility], [place].  Patient is no longer using mail order pharmacy.

## 2022-05-21 NOTE — Telephone Encounter (Signed)
*  STAT* If patient is at the pharmacy, call can be transferred to refill team.   1. Which medications need to be refilled? (please list name of each medication and dose if known)  furosemide (LASIX) 20 MG tablet amiodarone (PACERONE) 200 MG tablet   2. Which pharmacy/location (including street and city if local pharmacy) is medication to be sent to?[id number] [place], [place] - [address]  3. Do they need a 30 day or 90 day supply?  90 day

## 2022-05-21 NOTE — Telephone Encounter (Signed)
Medication was sent to pharmacy due to patient request and moving to a different pharmacy.  Lesia Hausen

## 2022-05-29 ENCOUNTER — Telehealth: Payer: Self-pay | Admitting: Family Medicine

## 2022-05-29 NOTE — Telephone Encounter (Signed)
Copied from [place] [id number]. Topic: Medicare AWV >> May 29, 2022 11:11 AM Jae Dire wrote: Reason for CRM:  Left message for patient to call back and schedule Medicare Annual Wellness Visit (AWV) in office.   If unable to come into the office for AWV,  please offer to do virtually or by telephone.  Last AWV:  06/02/2021  Please schedule at anytime with [place].  30 minute appointment for Virtual or phone 45 minute appointment for in office or Initial virtual/phone  Any questions, please contact me at [phone]

## 2022-06-08 ENCOUNTER — Ambulatory Visit
Admission: EM | Admit: 2022-06-08 | Discharge: 2022-06-08 | Disposition: A | Discharge status: Home / Self Care | Payer: Medicare HMO

## 2022-06-08 DIAGNOSIS — L244 Irritant contact dermatitis due to drugs in contact with skin: Secondary | ICD-10-CM | POA: Diagnosis not present

## 2022-06-08 NOTE — ED Triage Notes (Signed)
Pt. is accompanied by her daughter. Pt. Presents to UC w/ c/o a red splotchy rash on her face and neck. Pt's daughter states the patient may have accidentally used some testosterone cream on her skin and it has caused the reaction. Daughter and patient express concern for shingles.

## 2022-06-08 NOTE — ED Provider Notes (Addendum)
[facility]    CSN: [id number] Arrival date & time: 06/08/22  1524      History   Chief Complaint Chief Complaint  Patient presents with   Rash    HPI Jill Snow is a 88 y.o. female.    Rash   Patient is accompanied by her daughter.  They present to UC with complaint of rash on her anterior neck bilateral, and cheeks bilaterally.  Patient's daughter states the patient may have accidentally applied testosterone cream to her skin causing the reaction.  Some concern for shingles.  Patient has dementia.  Past Medical History:  Diagnosis Date   Atrial flutter ([place])    a. s/p successful TEE/DCCV in 2014; b. TEE 2014 showed EF 45-50%, mild bi-atrial enlargement, mod MR   Dyspnea    History of chicken pox    Hypercholesterolemia    Hypertension    Mitral regurgitation    a. echo 2014: EF 40-45%, mildly dilated RV, mod reduced RV systolic fxn, mod dilated LA, Mild to mod MR, mod TR, mildly elevated PASP   PAF (paroxysmal atrial fibrillation) ([place])    a. initial episode 2011; b. not on long term anticoagulation since 06/2013   RBBB (right bundle branch block)    Seasonal allergies    Sleep apnea    a. on CPAP   Thyroid nodule 05/19/2017    Patient Active Problem List   Diagnosis Date Noted   Cellulitis 03/23/2022   Strep pharyngitis 12/10/2021   Encounter for dental examination and cleaning without abnormal findings 09/17/2021   Late onset Alzheimer's dementia with behavioral disturbance ([place]) 06/03/2021   Urinary urgency 05/26/2021   Tremor of right hand 05/26/2021   Dry eyes 87/86/7672   Chronic systolic CHF (congestive heart failure) ([place]) 12/16/2020   Acute on chronic congestive heart failure ([place])    Cellulitis of left arm 12/22/2019   Left arm pain 12/22/2019   History of non anemic vitamin B12 deficiency 06/27/2018   Aortic atherosclerosis ([place]) 04/06/2018   Allergic rhinitis 05/19/2017   Lung nodule 12/10/2016   Low back pain 07/23/2016    Fatty liver 04/22/2016   Neuropathy 02/11/2016   Mitral regurgitation    Vitamin D deficiency 08/29/2014   Obstructive sleep apnea on CPAP 03/07/2014   Atrial fibrillation ([facility])    Hypercholesterolemia    RBBB (right bundle branch block)     Past Surgical History:  Procedure Laterality Date   ABDOMINAL HYSTERECTOMY     TONSILLECTOMY AND ADENOIDECTOMY  1947   tummy tuck      OB History   No obstetric history on file.      Home Medications    Prior to Admission medications   Medication Sig Start Date End Date Taking? Authorizing Provider  amiodarone (PACERONE) 200 MG tablet Take 1 tablet (200 mg total) by mouth daily. 05/21/22   Minna Merritts, MD  atorvastatin (LIPITOR) 40 MG tablet Take 1 tablet (40 mg total) by mouth daily. 05/21/22   Leone Haven, MD  cephALEXin (KEFLEX) 500 MG capsule Take 1 capsule (500 mg total) by mouth 4 (four) times daily. 03/11/22   Mar Daring, PA-C  [facility] D3 50 MCG (2000 UT) CAPS TAKE 1 CAPSULE EVERY DAY Patient taking differently: Take 10,000 Units by mouth at bedtime. 12/07/18   Leone Haven, MD  ELIQUIS 5 MG TABS tablet TAKE 1 TABLET BY MOUTH TWICE A DAY 12/24/21   Minna Merritts, MD  fluticasone (FLONASE) 50 MCG/ACT nasal  spray USE 2 SPRAYS IN EACH NOSTRIL EVERY DAY 04/08/20   Leone Haven, MD  furosemide (LASIX) 20 MG tablet TAKE 1 TABLET DAILY. MAY TAKE 1 EXTRA TABLET DAILY AS NEEDED FOR WEIGHT GAIN OF 2 POUNDS OVERNIGHT OR 5 POUNDS IN 1 WEEK 05/21/22   Gollan, Kathlene November, MD  IBU 400 MG tablet TAKE ONE TABLET BY MOUTH EVERY 8 HOURS AS NEEDED FOR MILD PAIN Patient taking differently: Take 400 mg by mouth every 8 (eight) hours as needed for mild pain. 01/27/18   Leone Haven, MD  melatonin 5 MG TABS Take 5 mg by mouth at bedtime.    [provider]  montelukast (SINGULAIR) 10 MG tablet Take 1 tablet (10 mg total) by mouth daily. 05/21/22   Leone Haven, MD  omeprazole (PRILOSEC) 20 MG capsule Take 1  capsule (20 mg total) by mouth daily. 01/05/22   Minna Merritts, MD  potassium chloride SA (KLOR-CON) 20 MEQ tablet Take 1 tablet (20 mEq total) by mouth daily. 07/04/21   Minna Merritts, MD  vitamin E 400 UNIT capsule Take 400 Units by mouth daily.    [provider]    Family History Family History  Problem Relation Age of Onset   Stroke Mother    Heart attack Mother    Arthritis Mother    Hyperlipidemia Mother    Heart disease Mother    Diabetes Mother    Diabetes Sister    Cancer Daughter    Heart disease Daughter    Diabetes Daughter    Diabetes Son    Diabetes Maternal Grandmother    Diabetes Maternal Grandfather     Social History Social History   Tobacco Use   Smoking status: Never   Smokeless tobacco: Never  Vaping Use   Vaping Use: Never used  Substance Use Topics   Alcohol use: No   Drug use: No     Allergies   Augmentin [amoxicillin-pot clavulanate], Codeine, Crestor [rosuvastatin], and Penicillins   Review of Systems Review of Systems  Skin:  Positive for rash.     Physical Exam Triage Vital Signs ED Triage Vitals [06/08/22 1614]  Enc Vitals Group     BP 106/65     Pulse Rate 60     Resp 14     Temp (!) 97.5 F (36.4 C)     Temp src      SpO2 97 %     Weight      Height      Head Circumference      Peak Flow      Pain Score 0     Pain Loc      Pain Edu?      Excl. in [place]?    No data found.  Updated Vital Signs BP 106/65   Pulse 60   Temp (!) 97.5 F (36.4 C)   Resp 14   SpO2 97%   Visual Acuity Right Eye Distance:   Left Eye Distance:   Bilateral Distance:    Right Eye Near:   Left Eye Near:    Bilateral Near:     Physical Exam Vitals reviewed.  Skin:    General: Skin is warm and dry.     Findings: Erythema and rash present.       Neurological:     General: No focal deficit present.     Mental Status: She is alert and oriented to person, place, and time.  Psychiatric:  Mood and Affect:  Mood normal.        Behavior: Behavior normal.      UC Treatments / Results  Labs (all labs ordered are listed, but only abnormal results are displayed) Labs Reviewed - No data to display  EKG   Radiology No results found.  Procedures Procedures (including critical care time)  Medications Ordered in UC Medications - No data to display  Initial Impression / Assessment and Plan / UC Course  I have reviewed the triage vital signs and the nursing notes.  Pertinent labs & imaging results that were available during my care of the patient were reviewed by me and considered in my medical decision making (see chart for details).   Erythematous skin is present on her anterior neck as well as her cheeks.  From the distribution of the rash, it would appear as if patient may have applied the cream to her cheeks as "rouge" as well as to her anterior neck.  They report symptoms have improved.  Recommending use of hydrocortisone cream as well as moisturizers.   Final Clinical Impressions(s) / UC Diagnoses   Final diagnoses:  None   Discharge Instructions   None    ED Prescriptions   None    PDMP not reviewed this encounter.   Rose Phi, [place] 06/08/22 1643    [place] HallAnnie Main, [place] 06/08/22 1643

## 2022-06-08 NOTE — Discharge Instructions (Addendum)
Ply moisturizers to the affected skin.  You may be able to speed healing by using hydrocortisone cream once a day for no longer than 1 week.    Follow up here or with your primary care provider if your symptoms are worsening or not improving.

## 2022-06-22 ENCOUNTER — Telehealth: Payer: Self-pay | Admitting: Family Medicine

## 2022-06-22 NOTE — Telephone Encounter (Signed)
Copied from [place] [id number]. Topic: Medicare AWV >> Jun 22, 2022  9:42 AM Devoria Glassing wrote: Reason for CRM: Left message for patient to schedule Annual Wellness Visit.  Please schedule with Nurse Health Advisor Denisa O'Brien-Blaney, LPN at [facility]. This appt can be telephone or office visit.  Please call [phone] ask for [facility]

## 2022-06-22 NOTE — Telephone Encounter (Signed)
Copied from [place] [phone]. Topic: Medicare AWV >> Jun 22, 2022  9:42 AM Devoria Glassing wrote: Reason for CRM: Left message for patient to schedule Annual Wellness Visit.  Please schedule with Nurse Health Advisor Denisa O'Brien-Blaney, LPN at [facility]. This appt can be telephone or office visit.  Please call [phone] ask for [facility]

## 2022-07-04 ENCOUNTER — Other Ambulatory Visit: Payer: Self-pay | Admitting: Cardiovascular Disease

## 2022-07-04 DIAGNOSIS — E876 Hypokalemia: Secondary | ICD-10-CM

## 2022-07-17 ENCOUNTER — Other Ambulatory Visit: Payer: Self-pay | Admitting: Cardiovascular Disease

## 2022-07-17 NOTE — Telephone Encounter (Signed)
Prescription refill request for Eliquis received. Indication:afib Last office visit:5/23 Scr:0.8 Age: 88 Weight:65.3  kg  Prescription refilled

## 2022-08-06 ENCOUNTER — Telehealth: Payer: Self-pay | Admitting: Family Medicine

## 2022-08-06 NOTE — Telephone Encounter (Signed)
Copied from [place] [phone]. Topic: Medicare AWV >> Aug 06, 2022 11:17 AM Devoria Glassing wrote: Reason for CRM: Left message for patient to schedule Annual Wellness Visit.  Please schedule with Nurse Health Advisor Madelyn Brunner, LPN at [facility]. This appt can be telephone or office visit.  Please call [phone] ask for [facility]

## 2022-08-08 ENCOUNTER — Telehealth: Payer: Medicare HMO | Admitting: Physician Assistant

## 2022-08-08 DIAGNOSIS — R3989 Other symptoms and signs involving the genitourinary system: Secondary | ICD-10-CM | POA: Diagnosis not present

## 2022-08-08 MED ORDER — CEPHALEXIN 500 MG PO CAPS: 500.0000 mg | ORAL_CAPSULE | Freq: Two times a day (BID) | ORAL | 0 refills | Status: DC

## 2022-08-08 NOTE — Progress Notes (Signed)

## 2022-08-26 ENCOUNTER — Telehealth: Payer: Self-pay | Admitting: Family Medicine

## 2022-08-26 ENCOUNTER — Ambulatory Visit (INDEPENDENT_AMBULATORY_CARE_PROVIDER_SITE_OTHER): Payer: Medicare HMO | Admitting: Family Medicine

## 2022-08-26 ENCOUNTER — Telehealth: Payer: Self-pay

## 2022-08-26 ENCOUNTER — Encounter: Payer: Self-pay | Admitting: Family Medicine

## 2022-08-26 VITALS — BP 115/70 | HR 61 | Temp 97.7°F | Ht 68.0 in | Wt 144.4 lb

## 2022-08-26 DIAGNOSIS — I5022 Chronic systolic (congestive) heart failure: Secondary | ICD-10-CM | POA: Diagnosis not present

## 2022-08-26 DIAGNOSIS — R600 Localized edema: Secondary | ICD-10-CM | POA: Diagnosis not present

## 2022-08-26 DIAGNOSIS — F02818 Dementia in other diseases classified elsewhere, unspecified severity, with other behavioral disturbance: Secondary | ICD-10-CM | POA: Diagnosis not present

## 2022-08-26 DIAGNOSIS — G301 Alzheimer's disease with late onset: Secondary | ICD-10-CM | POA: Diagnosis not present

## 2022-08-26 LAB — CBC
HCT: 40 % (ref 36.0–46.0)
Hemoglobin: 13.4 g/dL (ref 12.0–15.0)
MCHC: 33.6 g/dL (ref 30.0–36.0)
MCV: 96.9 fl (ref 78.0–100.0)
Platelets: 227 10*3/uL (ref 150.0–400.0)
RBC: 4.13 Mil/uL (ref 3.87–5.11)
RDW: 13.2 % (ref 11.5–15.5)
WBC: 4.4 10*3/uL (ref 4.0–10.5)

## 2022-08-26 LAB — COMPREHENSIVE METABOLIC PANEL WITH GFR
ALT: 24 U/L (ref 0–35)
AST: 30 U/L (ref 0–37)
Albumin: 4.1 g/dL (ref 3.5–5.2)
Alkaline Phosphatase: 86 U/L (ref 39–117)
BUN: 11 mg/dL (ref 6–23)
CO2: 29 meq/L (ref 19–32)
Calcium: 9.5 mg/dL (ref 8.4–10.5)
Chloride: 102 meq/L (ref 96–112)
Creatinine, Ser: 0.96 mg/dL (ref 0.40–1.20)
GFR: 53.47 mL/min — ABNORMAL LOW
Glucose, Bld: 88 mg/dL (ref 70–99)
Potassium: 4 meq/L (ref 3.5–5.1)
Sodium: 141 meq/L (ref 135–145)
Total Bilirubin: 1.3 mg/dL — ABNORMAL HIGH (ref 0.2–1.2)
Total Protein: 6.9 g/dL (ref 6.0–8.3)

## 2022-08-26 LAB — TSH: TSH: 2.49 u[IU]/mL (ref 0.35–5.50)

## 2022-08-26 LAB — BRAIN NATRIURETIC PEPTIDE: Pro B Natriuretic peptide (BNP): 59 pg/mL (ref 0.0–100.0)

## 2022-08-26 MED ORDER — TRAZODONE HCL 50 MG PO TABS: 25.0000 mg | ORAL_TABLET | Freq: Every evening | ORAL | 3 refills | Status: DC | PRN

## 2022-08-26 NOTE — Assessment & Plan Note (Signed)
Bilateral.  This could be related to her CHF though could also be related to an underlying issue such as anemia, protein issues, liver issues, or thyroid issues.  Labs will be checked to evaluate for underlying causes.

## 2022-08-26 NOTE — Assessment & Plan Note (Signed)
Chronic issue.  Sleeping issues may be related to her dementia.  Will check with our clinical pharmacist to see if we can use the trazodone with amiodarone given potential for QT prolongation with both of those medications.  QTc most recently was in the 480s.

## 2022-08-26 NOTE — Telephone Encounter (Signed)
-----   Message from Osker Mason, [place] sent at 08/26/2022  9:30 AM EST ----- Christa See question. Since she's been stable on amio for a while and it's low dose, and you're starting low dose trazodone, I think you are fine. Qtc was ~480 in May, since you see her for follow up in a month you could recheck an EKG? Unless her labs come back with low K or Mag, in which case I would correct first.   If you were considering high dose trazodone for depression, I'd be more concerned.   Catie ----- Message ----- From: Leone Haven, MD Sent: 08/26/2022   8:25 AM EST To: Osker Mason, RPH-CPP  Hey,   I was thinking about starting this patient on trazodone to help with her sleep though she is on amiodarone. I know both can cause qt prolongation. Is the risk of this dose dependent with trazodone? What are your thoughts on this combination of medications? Thanks.  Randall Hiss

## 2022-08-26 NOTE — Assessment & Plan Note (Addendum)
Chronic issue.  Swelling could be from her CHF.  We will check a BNP.  She will continue Lasix 20 mg daily and if her BNP is elevated we may have her increase the dose for a period of time.

## 2022-08-26 NOTE — Progress Notes (Signed)
[name], MD Phone: [phone]  Jill Snow is a 89 y.o. female who presents today for same day visit.   Leg swelling: This is bilateral and has been going on for a week or so.  Mostly occurs in her ankles and feet.  She has not missed any doses of Eliquis.  She notes no orthopnea, PND, or shortness of breath.  Propping her legs up helps some.  She has taken an extra dose of Lasix without significant benefit.  She notes no new medications or supplements.  Her daughter wondered if it was related to dehydration as it is hard to get her to drink anything other than soda and they have tried to add an additional liquids.  Sleep issues: They report some issues with sleeping at night.  The patient was asleep on the couch instead of in her bed.  The patient's daughter reports her brother states that the patient gets up off and on throughout the night.  The patient does have dementia.  Social History   Tobacco Use  Smoking Status Never  Smokeless Tobacco Never    Current Outpatient Medications on File Prior to Visit  Medication Sig Dispense Refill   amiodarone (PACERONE) 200 MG tablet Take 1 tablet (200 mg total) by mouth daily. 90 tablet 3   atorvastatin (LIPITOR) 40 MG tablet Take 1 tablet (40 mg total) by mouth daily. 90 tablet 1   cephALEXin (KEFLEX) 500 MG capsule Take 1 capsule (500 mg total) by mouth 2 (two) times daily. 14 capsule 0   CVS D3 50 MCG (2000 UT) CAPS TAKE 1 CAPSULE EVERY DAY (Patient taking differently: Take 10,000 Units by mouth at bedtime.) 100 capsule 3   ELIQUIS 5 MG TABS tablet TAKE 1 TABLET BY MOUTH TWICE A DAY 60 tablet 5   fluticasone (FLONASE) 50 MCG/ACT nasal spray USE 2 SPRAYS IN EACH NOSTRIL EVERY DAY 48 g 1   furosemide (LASIX) 20 MG tablet TAKE 1 TABLET DAILY. MAY TAKE 1 EXTRA TABLET DAILY AS NEEDED FOR WEIGHT GAIN OF 2 POUNDS OVERNIGHT OR 5 POUNDS IN 1 WEEK 90 tablet 3   IBU 400 MG tablet TAKE ONE TABLET BY MOUTH EVERY 8 HOURS AS NEEDED FOR MILD  PAIN (Patient taking differently: Take 400 mg by mouth every 8 (eight) hours as needed for mild pain.) 30 tablet 0   melatonin 5 MG TABS Take 5 mg by mouth at bedtime.     montelukast (SINGULAIR) 10 MG tablet Take 1 tablet (10 mg total) by mouth daily. 90 tablet 1   omeprazole (PRILOSEC) 20 MG capsule Take 1 capsule (20 mg total) by mouth daily. 30 capsule 11   potassium chloride SA (KLOR-CON M20) 20 MEQ tablet TAKE 1 TABLET BY MOUTH EVERY DAY 90 tablet 2   vitamin E 400 UNIT capsule Take 400 Units by mouth daily.     No current facility-administered medications on file prior to visit.     ROS see history of present illness  Objective  Physical Exam Vitals:   08/26/22 0810  BP: 115/70  Pulse: 61  Temp: 97.7 F (36.5 C)  SpO2: 97%    BP Readings from Last 3 Encounters:  08/26/22 115/70  06/08/22 106/65  03/23/22 90/60   Wt Readings from Last 3 Encounters:  08/26/22 144 lb 6.4 oz (65.5 kg)  03/23/22 144 lb (65.3 kg)  01/05/22 145 lb (65.8 kg)    Physical Exam Constitutional:      General: She is not in acute  distress.    Appearance: She is not diaphoretic.  Cardiovascular:     Rate and Rhythm: Normal rate and regular rhythm.     Heart sounds: Normal heart sounds.  Pulmonary:     Effort: Pulmonary effort is normal.     Breath sounds: Normal breath sounds.  Musculoskeletal:     Comments: 1+ pitting edema at the ankles, trace pitting edema up to the midshin bilaterally  Skin:    General: Skin is warm and dry.  Neurological:     Mental Status: She is alert.      Assessment/Plan: Please see individual problem list.  Chronic systolic CHF (congestive heart failure) ([place]) Assessment & Plan: Chronic issue.  Swelling could be from her CHF.  We will check a BNP.  She will continue Lasix 20 mg daily and if her BNP is elevated we may have her increase the dose for a period of time.  Orders: -     Brain natriuretic peptide  Late onset Alzheimer's dementia with  behavioral disturbance ([place]) Assessment & Plan: Chronic issue.  Sleeping issues may be related to her dementia.  Will check with our clinical pharmacist to see if we can use the trazodone with amiodarone given potential for QT prolongation with both of those medications.  QTc most recently was in the 480s.   Lower extremity edema Assessment & Plan: Bilateral.  This could be related to her CHF though could also be related to an underlying issue such as anemia, protein issues, liver issues, or thyroid issues.  Labs will be checked to evaluate for underlying causes.  Orders: -     TSH -     CBC -     Comprehensive metabolic panel     Return for As scheduled.   [name], MD [place]

## 2022-08-26 NOTE — Telephone Encounter (Signed)
Please let the patient know that I heard back from the pharmacist. I think it will be ok to try the low dose of trazadone to help with sleep and sundowning. If she starts to have any palpitations on this medication or if she has excessive drowsiness with this they need to let us know.

## 2022-08-26 NOTE — Telephone Encounter (Signed)
LVM for patient to call back.   Skylar Flynt,cma  

## 2022-08-26 NOTE — Patient Instructions (Signed)
Nice to see you. We are getting lab work today and we will contact you with the results. I will let you know once I hear from the pharmacist on the trazodone.

## 2022-08-28 NOTE — Telephone Encounter (Signed)
Patient's daughter returned your call . She will call back after lunch.

## 2022-08-28 NOTE — Telephone Encounter (Signed)
LVM for patient to call back.   Shiza Thelen,cma  

## 2022-08-31 NOTE — Telephone Encounter (Signed)
Message sent to [facility] after 3 attempts to reach patients daughter by phone.  Josep Luviano,cma

## 2022-09-01 ENCOUNTER — Telehealth: Payer: Self-pay

## 2022-09-01 NOTE — Telephone Encounter (Signed)
Left message for Patient's daughter to call the office back regarding results.

## 2022-09-01 NOTE — Telephone Encounter (Signed)
Patients daughter answered on mychart.  Tayli Buch,cma

## 2022-09-17 ENCOUNTER — Telehealth: Payer: Self-pay

## 2022-09-17 NOTE — Telephone Encounter (Signed)
Lm for pt to cb re : sched AWV

## 2022-09-28 ENCOUNTER — Ambulatory Visit: Payer: Medicare HMO | Admitting: Family Medicine

## 2022-10-16 ENCOUNTER — Telehealth: Payer: Self-pay | Admitting: Family Medicine

## 2022-10-16 NOTE — Telephone Encounter (Signed)
Copied from [place] [phone]. Topic: Medicare AWV >> Oct 16, 2022 12:59 PM Lollie Marrow wrote: Reason for CRM: Called patient to schedule Medicare Annual Wellness Visit (AWV). Left message for patient to call back and schedule Medicare Annual Wellness Visit (AWV).  Last date of AWV: 06/02/2021  Please schedule an appointment at any time with Denisa, [place].  If any questions, please contact me at [phone].    Thank you,  [place] Direct dial  [phone]

## 2022-11-10 ENCOUNTER — Ambulatory Visit (INDEPENDENT_AMBULATORY_CARE_PROVIDER_SITE_OTHER): Payer: Medicare HMO | Admitting: Family Medicine

## 2022-11-10 ENCOUNTER — Encounter: Payer: Self-pay | Admitting: Family Medicine

## 2022-11-10 VITALS — BP 114/74 | HR 58 | Temp 98.1°F | Ht 68.0 in | Wt 141.2 lb

## 2022-11-10 DIAGNOSIS — J309 Allergic rhinitis, unspecified: Secondary | ICD-10-CM

## 2022-11-10 DIAGNOSIS — R251 Tremor, unspecified: Secondary | ICD-10-CM

## 2022-11-10 DIAGNOSIS — I5022 Chronic systolic (congestive) heart failure: Secondary | ICD-10-CM

## 2022-11-10 DIAGNOSIS — E7800 Pure hypercholesterolemia, unspecified: Secondary | ICD-10-CM

## 2022-11-10 DIAGNOSIS — I872 Venous insufficiency (chronic) (peripheral): Secondary | ICD-10-CM | POA: Insufficient documentation

## 2022-11-10 DIAGNOSIS — G301 Alzheimer's disease with late onset: Secondary | ICD-10-CM

## 2022-11-10 DIAGNOSIS — F02818 Dementia in other diseases classified elsewhere, unspecified severity, with other behavioral disturbance: Secondary | ICD-10-CM

## 2022-11-10 LAB — LIPID PANEL
Cholesterol: 164 mg/dL (ref 0–200)
HDL: 50.9 mg/dL (ref >39.00)
LDL Cholesterol: 93 mg/dL (ref 0–99)
NonHDL: 113.08
Total CHOL/HDL Ratio: 3
Triglycerides: 100 mg/dL (ref 0.0–149.0)
VLDL: 20 mg/dL (ref 0.0–40.0)

## 2022-11-10 MED ORDER — MONTELUKAST SODIUM 10 MG PO TABS: 10.0000 mg | ORAL_TABLET | Freq: Every day | ORAL | 1 refills | Status: DC

## 2022-11-10 MED ORDER — ATORVASTATIN CALCIUM 40 MG PO TABS: 40.0000 mg | ORAL_TABLET | Freq: Every day | ORAL | 1 refills | Status: DC

## 2022-11-10 NOTE — Assessment & Plan Note (Signed)
Chronic issue.  Discussed having her follow-up with neurology to see what they think about her current symptoms.

## 2022-11-10 NOTE — Assessment & Plan Note (Signed)
Chronic issue.  It seems as though the swelling may be more likely related to venous insufficiency.  She seems to be euvolemic with stable weight and absence of other symptoms.  She will continue Lasix 20 mg daily.

## 2022-11-10 NOTE — Assessment & Plan Note (Signed)
Discussed use of compression socks during the day.  Prescription given for them to take to [facility] medical supply to get fit for these.

## 2022-11-10 NOTE — Assessment & Plan Note (Signed)
Chronic issue.  Continue Lipitor 40 mg daily.  Check labs. 

## 2022-11-10 NOTE — Patient Instructions (Signed)
You can take the prescription for the compression stockings to [facility] medical supply to try to get fit for compression stockings. We will contact you with your lipid panel results. Please make sure you contact neurology to schedule follow-up.

## 2022-11-10 NOTE — Progress Notes (Signed)
[name], MD Phone: [phone]  Jill Snow is a 89 y.o. female who presents today for follow-up.  CHF/leg swelling: Patient continues on Lasix 20 mg daily.  She notes no shortness of breath.  She does not have consistent orthopnea.  No PND.  She does have some chronic swelling and wears some over-the-counter compression stockings that do not seem to work well.  Sleep issues: She is getting better sleep.  She is now sleeping in her bed.  She is not taking trazodone.  Dementia: Per distant memory is adequate though more recent memories are lacking.  Her daughter reports she is talking to herself at times.  She does try to wander though they have door alarms.  Tremor: Noted today.  The daughter reports that she may have talked about this with neurology previously.  Has not seen neurology in over a year.  Social History   Tobacco Use  Smoking Status Never  Smokeless Tobacco Never    Current Outpatient Medications on File Prior to Visit  Medication Sig Dispense Refill   amiodarone (PACERONE) 200 MG tablet Take 1 tablet (200 mg total) by mouth daily. 90 tablet 3   cephALEXin (KEFLEX) 500 MG capsule Take 1 capsule (500 mg total) by mouth 2 (two) times daily. 14 capsule 0   [facility] D3 50 MCG (2000 UT) CAPS TAKE 1 CAPSULE EVERY DAY (Patient taking differently: Take 10,000 Units by mouth at bedtime.) 100 capsule 3   ELIQUIS 5 MG TABS tablet TAKE 1 TABLET BY MOUTH TWICE A DAY 60 tablet 5   fluticasone (FLONASE) 50 MCG/ACT nasal spray USE 2 SPRAYS IN EACH NOSTRIL EVERY DAY 48 g 1   furosemide (LASIX) 20 MG tablet TAKE 1 TABLET DAILY. MAY TAKE 1 EXTRA TABLET DAILY AS NEEDED FOR WEIGHT GAIN OF 2 POUNDS OVERNIGHT OR 5 POUNDS IN 1 WEEK 90 tablet 3   IBU 400 MG tablet TAKE ONE TABLET BY MOUTH EVERY 8 HOURS AS NEEDED FOR MILD PAIN (Patient taking differently: Take 400 mg by mouth every 8 (eight) hours as needed for mild pain.) 30 tablet 0   melatonin 5 MG TABS Take 5 mg by mouth at  bedtime.     omeprazole (PRILOSEC) 20 MG capsule Take 1 capsule (20 mg total) by mouth daily. 30 capsule 11   potassium chloride SA (KLOR-CON M20) 20 MEQ tablet TAKE 1 TABLET BY MOUTH EVERY DAY 90 tablet 2   traZODone (DESYREL) 50 MG tablet Take 0.5 tablets (25 mg total) by mouth at bedtime as needed for sleep. 30 tablet 3   vitamin E 400 UNIT capsule Take 400 Units by mouth daily.     No current facility-administered medications on file prior to visit.     ROS see history of present illness  Objective  Physical Exam Vitals:   11/10/22 1015  BP: 114/74  Pulse: (!) 58  Temp: 98.1 F (36.7 C)  SpO2: 98%    BP Readings from Last 3 Encounters:  11/10/22 114/74  08/26/22 115/70  06/08/22 106/65   Wt Readings from Last 3 Encounters:  11/10/22 141 lb 3.2 oz (64 kg)  08/26/22 144 lb 6.4 oz (65.5 kg)  03/23/22 144 lb (65.3 kg)    Physical Exam Constitutional:      General: She is not in acute distress.    Appearance: She is not diaphoretic.  Cardiovascular:     Rate and Rhythm: Normal rate and regular rhythm.     Heart sounds: Normal heart sounds.  Comments: 2+ DP pulses bilaterally Pulmonary:     Effort: Pulmonary effort is normal.     Breath sounds: Normal breath sounds.  Skin:    General: Skin is warm and dry.  Neurological:     Mental Status: She is alert.     Comments: Resting tremor right hand, no cogwheel rigidity in either upper extremity      Assessment/Plan: Please see individual problem list.  Late onset Alzheimer's dementia with behavioral disturbance ([place]) Assessment & Plan: Chronic issue.  Discussed having her follow-up with neurology to see what they think about her current symptoms.   Hypercholesterolemia Assessment & Plan: Chronic issue.  Continue Lipitor 40 mg daily.  Check labs.  Orders: -     Atorvastatin Calcium; Take 1 tablet (40 mg total) by mouth daily.  Dispense: 90 tablet; Refill: 1 -     Lipid panel  Allergic rhinitis -      Montelukast Sodium; Take 1 tablet (10 mg total) by mouth daily.  Dispense: 90 tablet; Refill: 1  Tremor of right hand Assessment & Plan: Chronic issue.  I encouraged him to follow-up with neurology.  They will call to schedule an appointment.   Chronic systolic CHF (congestive heart failure) ([place]) Assessment & Plan: Chronic issue.  It seems as though the swelling may be more likely related to venous insufficiency.  She seems to be euvolemic with stable weight and absence of other symptoms.  She will continue Lasix 20 mg daily.   Venous insufficiency Assessment & Plan: Discussed use of compression socks during the day.  Prescription given for them to take to [facility] medical supply to get fit for these.     Return in about 6 months (around 05/13/2023).   [name], MD [place]

## 2022-11-10 NOTE — Assessment & Plan Note (Signed)
Chronic issue.  I encouraged him to follow-up with neurology.  They will call to schedule an appointment.

## 2022-12-09 ENCOUNTER — Telehealth: Payer: Medicare HMO | Admitting: Nurse Practitioner

## 2022-12-09 DIAGNOSIS — W5503XA Scratched by cat, initial encounter: Secondary | ICD-10-CM | POA: Diagnosis not present

## 2022-12-09 DIAGNOSIS — R21 Rash and other nonspecific skin eruption: Secondary | ICD-10-CM | POA: Diagnosis not present

## 2022-12-09 MED ORDER — CEPHALEXIN 500 MG PO CAPS: 500.0000 mg | ORAL_CAPSULE | Freq: Four times a day (QID) | ORAL | 0 refills | Status: AC

## 2022-12-09 MED ORDER — SULFAMETHOXAZOLE-TRIMETHOPRIM 800-160 MG PO TABS: 1.0000 | ORAL_TABLET | Freq: Two times a day (BID) | ORAL | 0 refills | Status: DC

## 2022-12-09 NOTE — Addendum Note (Signed)
Addended by: Harlow Mares on: 12/09/2022 07:14 PM   Modules accepted: Orders

## 2022-12-09 NOTE — Progress Notes (Signed)
E Visit for Cellulitis  We are sorry that you are not feeling well. Here is how we plan to help!  Based on what you shared with me it looks like you have cellulitis.  Cellulitis looks like areas of skin redness, swelling, and warmth; it develops as a result of bacteria entering under the skin. Little red spots and/or bleeding can be seen in skin, and tiny surface sacs containing fluid can occur. Fever can be present. Cellulitis is almost always on one side of a body, and the lower limbs are the most common site of involvement.   I have prescribed:  Bactrim DS 1 tablet by mouth twice a day for 7 days  HOME CARE:  Take your medications as ordered and take all of them, even if the skin irritation appears to be healing.   GET HELP RIGHT AWAY IF:  Symptoms that don't begin to go away within 48 hours. Severe redness persists or worsens If the area turns color, spreads or swells. If it blisters and opens, develops yellow-brown crust or bleeds. You develop a fever or chills. If the pain increases or becomes unbearable.  Are unable to keep fluids and food down.  MAKE SURE YOU   Understand these instructions. Will watch your condition. Will get help right away if you are not doing well or get worse.  Thank you for choosing an e-visit.  Your e-visit answers were reviewed by a board certified advanced clinical practitioner to complete your personal care plan. Depending upon the condition, your plan could have included both over the counter or prescription medications.  Please review your pharmacy choice. Make sure the pharmacy is open so you can pick up prescription now. If there is a problem, you may contact your provider through MyChart messaging and have the prescription routed to another pharmacy.  Your safety is important to us. If you have drug allergies check your prescription carefully.   For the next 24 hours you can use MyChart to ask questions about today's visit, request a  non-urgent call back, or ask for a work or school excuse. You will get an email in the next two days asking about your experience. I hope that your e-visit has been valuable and will speed your recovery.   Meds ordered this encounter  Medications   sulfamethoxazole-trimethoprim (BACTRIM DS) 800-160 MG tablet    Sig: Take 1 tablet by mouth 2 (two) times daily for 7 days.    Dispense:  14 tablet    Refill:  0    I spent approximately 5 minutes reviewing the patient's history, current symptoms and coordinating their care today.   

## 2022-12-25 ENCOUNTER — Emergency Department (HOSPITAL_COMMUNITY): Payer: Medicare HMO

## 2022-12-25 ENCOUNTER — Inpatient Hospital Stay (HOSPITAL_COMMUNITY)
Admission: RE | Admit: 2022-12-25 | Payer: Medicare HMO | Source: Intra-hospital | Admitting: Physical Medicine & Rehabilitation

## 2022-12-25 ENCOUNTER — Other Ambulatory Visit: Payer: Self-pay

## 2022-12-25 ENCOUNTER — Encounter (HOSPITAL_COMMUNITY): Payer: Self-pay

## 2022-12-25 ENCOUNTER — Inpatient Hospital Stay (HOSPITAL_COMMUNITY)
Admission: EM | Admit: 2022-12-25 | Discharge: 2022-12-27 | DRG: 871 | Disposition: A | Discharge status: Home / Self Care | Payer: Medicare HMO | Attending: Internal Medicine | Admitting: Internal Medicine

## 2022-12-25 DIAGNOSIS — I110 Hypertensive heart disease with heart failure: Secondary | ICD-10-CM | POA: Diagnosis not present

## 2022-12-25 DIAGNOSIS — K449 Diaphragmatic hernia without obstruction or gangrene: Secondary | ICD-10-CM | POA: Diagnosis not present

## 2022-12-25 DIAGNOSIS — Z833 Family history of diabetes mellitus: Secondary | ICD-10-CM

## 2022-12-25 DIAGNOSIS — G629 Polyneuropathy, unspecified: Secondary | ICD-10-CM

## 2022-12-25 DIAGNOSIS — Z888 Allergy status to other drugs, medicaments and biological substances status: Secondary | ICD-10-CM

## 2022-12-25 DIAGNOSIS — N390 Urinary tract infection, site not specified: Secondary | ICD-10-CM | POA: Diagnosis not present

## 2022-12-25 DIAGNOSIS — Z90710 Acquired absence of both cervix and uterus: Secondary | ICD-10-CM | POA: Diagnosis not present

## 2022-12-25 DIAGNOSIS — Z79899 Other long term (current) drug therapy: Secondary | ICD-10-CM

## 2022-12-25 DIAGNOSIS — Z8249 Family history of ischemic heart disease and other diseases of the circulatory system: Secondary | ICD-10-CM | POA: Diagnosis not present

## 2022-12-25 DIAGNOSIS — N3001 Acute cystitis with hematuria: Secondary | ICD-10-CM

## 2022-12-25 DIAGNOSIS — Z7901 Long term (current) use of anticoagulants: Secondary | ICD-10-CM | POA: Diagnosis not present

## 2022-12-25 DIAGNOSIS — R2681 Unsteadiness on feet: Secondary | ICD-10-CM | POA: Diagnosis present

## 2022-12-25 DIAGNOSIS — Z880 Allergy status to penicillin: Secondary | ICD-10-CM | POA: Diagnosis not present

## 2022-12-25 DIAGNOSIS — A4151 Sepsis due to Escherichia coli [E. coli]: Secondary | ICD-10-CM | POA: Diagnosis not present

## 2022-12-25 DIAGNOSIS — G4733 Obstructive sleep apnea (adult) (pediatric): Secondary | ICD-10-CM

## 2022-12-25 DIAGNOSIS — A419 Sepsis, unspecified organism: Secondary | ICD-10-CM | POA: Diagnosis not present

## 2022-12-25 DIAGNOSIS — I5032 Chronic diastolic (congestive) heart failure: Secondary | ICD-10-CM | POA: Diagnosis present

## 2022-12-25 DIAGNOSIS — R509 Fever, unspecified: Principal | ICD-10-CM

## 2022-12-25 DIAGNOSIS — Z83438 Family history of other disorder of lipoprotein metabolism and other lipidemia: Secondary | ICD-10-CM | POA: Diagnosis not present

## 2022-12-25 DIAGNOSIS — K760 Fatty (change of) liver, not elsewhere classified: Secondary | ICD-10-CM | POA: Diagnosis present

## 2022-12-25 DIAGNOSIS — G301 Alzheimer's disease with late onset: Secondary | ICD-10-CM | POA: Diagnosis present

## 2022-12-25 DIAGNOSIS — R6520 Severe sepsis without septic shock: Secondary | ICD-10-CM | POA: Diagnosis present

## 2022-12-25 DIAGNOSIS — Z823 Family history of stroke: Secondary | ICD-10-CM | POA: Diagnosis not present

## 2022-12-25 DIAGNOSIS — I5022 Chronic systolic (congestive) heart failure: Secondary | ICD-10-CM | POA: Diagnosis not present

## 2022-12-25 DIAGNOSIS — R4182 Altered mental status, unspecified: Secondary | ICD-10-CM | POA: Insufficient documentation

## 2022-12-25 DIAGNOSIS — G9341 Metabolic encephalopathy: Secondary | ICD-10-CM | POA: Diagnosis not present

## 2022-12-25 DIAGNOSIS — I480 Paroxysmal atrial fibrillation: Secondary | ICD-10-CM | POA: Diagnosis present

## 2022-12-25 DIAGNOSIS — I340 Nonrheumatic mitral (valve) insufficiency: Secondary | ICD-10-CM | POA: Diagnosis present

## 2022-12-25 DIAGNOSIS — E7800 Pure hypercholesterolemia, unspecified: Secondary | ICD-10-CM | POA: Diagnosis present

## 2022-12-25 DIAGNOSIS — I4891 Unspecified atrial fibrillation: Secondary | ICD-10-CM | POA: Diagnosis present

## 2022-12-25 DIAGNOSIS — F02818 Dementia in other diseases classified elsewhere, unspecified severity, with other behavioral disturbance: Secondary | ICD-10-CM | POA: Diagnosis present

## 2022-12-25 DIAGNOSIS — Z91018 Allergy to other foods: Secondary | ICD-10-CM

## 2022-12-25 DIAGNOSIS — Z8261 Family history of arthritis: Secondary | ICD-10-CM | POA: Diagnosis not present

## 2022-12-25 DIAGNOSIS — Z881 Allergy status to other antibiotic agents status: Secondary | ICD-10-CM

## 2022-12-25 DIAGNOSIS — R531 Weakness: Secondary | ICD-10-CM | POA: Diagnosis not present

## 2022-12-25 LAB — URINALYSIS, W/ REFLEX TO CULTURE (INFECTION SUSPECTED)
Bilirubin Urine: NEGATIVE
Glucose, UA: NEGATIVE mg/dL
Ketones, ur: NEGATIVE mg/dL
Nitrite: NEGATIVE
Protein, ur: NEGATIVE mg/dL
RBC / HPF: >50 RBC/hpf (ref 0–5)
Specific Gravity, Urine: 1.018 (ref 1.005–1.030)
WBC, UA: >50 WBC/hpf (ref 0–5)
pH: 6 (ref 5.0–8.0)

## 2022-12-25 LAB — COMPREHENSIVE METABOLIC PANEL
ALT: 29 U/L (ref 0–44)
AST: 31 U/L (ref 15–41)
Albumin: 3.8 g/dL (ref 3.5–5.0)
Alkaline Phosphatase: 72 U/L (ref 38–126)
Anion gap: 9 (ref 5–15)
BUN: 12 mg/dL (ref 8–23)
CO2: 26 mmol/L (ref 22–32)
Calcium: 9.2 mg/dL (ref 8.9–10.3)
Chloride: 102 mmol/L (ref 98–111)
Creatinine, Ser: 1.11 mg/dL — ABNORMAL HIGH (ref 0.44–1.00)
GFR, Estimated: 48 mL/min — ABNORMAL LOW (ref >60)
Glucose, Bld: 109 mg/dL — ABNORMAL HIGH (ref 70–99)
Potassium: 3.7 mmol/L (ref 3.5–5.1)
Sodium: 137 mmol/L (ref 135–145)
Total Bilirubin: 2 mg/dL — ABNORMAL HIGH (ref 0.3–1.2)
Total Protein: 7.2 g/dL (ref 6.5–8.1)

## 2022-12-25 LAB — CBC WITH DIFFERENTIAL/PLATELET
Abs Immature Granulocytes: 0.04 10*3/uL (ref 0.00–0.07)
Basophils Absolute: 0 10*3/uL (ref 0.0–0.1)
Basophils Relative: 0 %
Eosinophils Absolute: 0 10*3/uL (ref 0.0–0.5)
Eosinophils Relative: 0 %
HCT: 42.3 % (ref 36.0–46.0)
Hemoglobin: 13.8 g/dL (ref 12.0–15.0)
Immature Granulocytes: 1 %
Lymphocytes Relative: 4 %
Lymphs Abs: 0.2 10*3/uL — ABNORMAL LOW (ref 0.7–4.0)
MCH: 32.1 pg (ref 26.0–34.0)
MCHC: 32.6 g/dL (ref 30.0–36.0)
MCV: 98.4 fL (ref 80.0–100.0)
Monocytes Absolute: 0.4 10*3/uL (ref 0.1–1.0)
Monocytes Relative: 6 %
Neutro Abs: 6.1 10*3/uL (ref 1.7–7.7)
Neutrophils Relative %: 89 %
Platelets: 146 10*3/uL — ABNORMAL LOW (ref 150–400)
RBC: 4.3 MIL/uL (ref 3.87–5.11)
RDW: 12.6 % (ref 11.5–15.5)
WBC: 6.8 10*3/uL (ref 4.0–10.5)
nRBC: 0 % (ref 0.0–0.2)

## 2022-12-25 LAB — LACTIC ACID, PLASMA
Lactic Acid, Venous: 1.2 mmol/L (ref 0.5–1.9)
Lactic Acid, Venous: 1.3 mmol/L (ref 0.5–1.9)

## 2022-12-25 LAB — PROTIME-INR
INR: 1.3 — ABNORMAL HIGH (ref 0.8–1.2)
Prothrombin Time: 16.4 seconds — ABNORMAL HIGH (ref 11.4–15.2)

## 2022-12-25 MED ORDER — VANCOMYCIN HCL 750 MG/150ML IV SOLN: 750.0000 mg | INTRAVENOUS | Status: DC

## 2022-12-25 MED ORDER — SODIUM CHLORIDE 0.9 % IV BOLUS
1000.0000 mL | Freq: Once | INTRAVENOUS | Status: AC
  Administered 2022-12-25: 1000 mL via INTRAVENOUS

## 2022-12-25 MED ORDER — ACETAMINOPHEN 650 MG RE SUPP: 650.0000 mg | Freq: Four times a day (QID) | RECTAL | Status: DC | PRN

## 2022-12-25 MED ORDER — SODIUM CHLORIDE 0.9 % IV SOLN: 2.0000 g | Freq: Two times a day (BID) | INTRAVENOUS | Status: DC

## 2022-12-25 MED ORDER — SODIUM CHLORIDE 0.9 % IV SOLN
2.0000 g | INTRAVENOUS | Status: DC
  Administered 2022-12-25 – 2022-12-27 (×3): 2 g via INTRAVENOUS
  Filled 2022-12-25 (×3): qty 20

## 2022-12-25 MED ORDER — VANCOMYCIN HCL 1250 MG/250ML IV SOLN
1250.0000 mg | Freq: Once | INTRAVENOUS | Status: AC
  Administered 2022-12-25: 1250 mg via INTRAVENOUS
  Filled 2022-12-25: qty 250

## 2022-12-25 MED ORDER — SODIUM CHLORIDE 0.9% FLUSH
3.0000 mL | Freq: Two times a day (BID) | INTRAVENOUS | Status: DC
  Administered 2022-12-25 – 2022-12-27 (×4): 3 mL via INTRAVENOUS

## 2022-12-25 MED ORDER — ACETAMINOPHEN 500 MG PO TABS
1000.0000 mg | ORAL_TABLET | Freq: Once | ORAL | Status: AC
  Administered 2022-12-25: 1000 mg via ORAL
  Filled 2022-12-25: qty 2

## 2022-12-25 MED ORDER — APIXABAN 5 MG PO TABS
5.0000 mg | ORAL_TABLET | Freq: Two times a day (BID) | ORAL | Status: DC
  Administered 2022-12-25 – 2022-12-27 (×4): 5 mg via ORAL
  Filled 2022-12-25 (×4): qty 1

## 2022-12-25 MED ORDER — ATORVASTATIN CALCIUM 40 MG PO TABS
40.0000 mg | ORAL_TABLET | Freq: Every day | ORAL | Status: DC
  Administered 2022-12-26 – 2022-12-27 (×2): 40 mg via ORAL
  Filled 2022-12-25 (×2): qty 1

## 2022-12-25 MED ORDER — POLYETHYLENE GLYCOL 3350 17 G PO PACK: 17.0000 g | PACK | Freq: Every day | ORAL | Status: DC | PRN

## 2022-12-25 MED ORDER — MELATONIN 5 MG PO TABS
5.0000 mg | ORAL_TABLET | Freq: Every day | ORAL | Status: DC
  Administered 2022-12-25 – 2022-12-26 (×2): 5 mg via ORAL
  Filled 2022-12-25 (×2): qty 1

## 2022-12-25 MED ORDER — AMIODARONE HCL 200 MG PO TABS
200.0000 mg | ORAL_TABLET | Freq: Every day | ORAL | Status: DC
  Administered 2022-12-26 – 2022-12-27 (×2): 200 mg via ORAL
  Filled 2022-12-25 (×2): qty 1

## 2022-12-25 MED ORDER — PANTOPRAZOLE SODIUM 40 MG PO TBEC
40.0000 mg | DELAYED_RELEASE_TABLET | Freq: Every day | ORAL | Status: DC
  Administered 2022-12-26 – 2022-12-27 (×2): 40 mg via ORAL
  Filled 2022-12-25 (×2): qty 1

## 2022-12-25 MED ORDER — ACETAMINOPHEN 325 MG PO TABS
650.0000 mg | ORAL_TABLET | Freq: Four times a day (QID) | ORAL | Status: DC | PRN
  Administered 2022-12-26: 650 mg via ORAL
  Filled 2022-12-25: qty 2

## 2022-12-25 MED ORDER — SODIUM CHLORIDE 0.9 % IV SOLN
2.0000 g | Freq: Once | INTRAVENOUS | Status: AC
  Administered 2022-12-25: 2 g via INTRAVENOUS
  Filled 2022-12-25: qty 12.5

## 2022-12-25 NOTE — Progress Notes (Signed)
Pt admitted to rm 24 from ED. Initiated tele. Oriented pt and family member to the unit. VSS. Call bell within reach. Bed alarm on.   Lawson Radar, RN

## 2022-12-25 NOTE — Progress Notes (Signed)
Pharmacy Antibiotic Note  Jill Snow is a 89 y.o. female admitted on 12/25/2022 presenting with AMS, concern for sepsis.  Pharmacy has been consulted for vancomycin and cefepime dosing.  Plan: Vancomycin 1250 mg IV x 1, then 750 mg IV q 24h (eAUC 467) Cefepime 2g IV every 12 h Monitor renal function, Cx and clinical progression to narrow Vancomycin levels as indicated  Height: 5\' 8"  (172.7 cm) IBW/kg (Calculated) : 63.9  Temp (24hrs), Avg:100.7 F (38.2 C), Min:100.7 F (38.2 C), Max:100.7 F (38.2 C)  Recent Labs  Lab 12/25/22 1232 12/25/22 1353  WBC 6.8  --   CREATININE 1.11*  --   LATICACIDVEN 1.2 1.3    CrCl cannot be calculated (Unknown ideal weight.).    Allergies  Allergen Reactions   Augmentin [Amoxicillin-Pot Clavulanate] Nausea And Vomiting   Codeine Anaphylaxis    "cant remember reaction"   Crestor [Rosuvastatin] Other (See Comments)    Muscle pain/cramps    Penicillins     Daylene Posey, PharmD, [facility] Clinical Pharmacist ED Pharmacist Phone # [phone] 12/25/2022 3:52 PM

## 2022-12-25 NOTE — H&P (Signed)
History and Physical   Jill Snow:[id number] DOB: Jan 30, 1934 DOA: 12/25/2022  PCP: Glori Luis, MD   Patient coming from: Home  Chief Complaint: AMS, fatigue  HPI: Jill Snow is a 89 y.o. female with medical history significant of OSA, neuropathy, low back pain, Alzheimer's dementia, hyperlipidemia, fatty liver, atrial fibrillation, CHF, cellulitis presenting with altered mental status, fatigue.  History obtained with assistance of family and chart review.  Patient has had 2 days of weakness and fatigue and appearing unsteady on her feet.  Darted with unsteadiness on her feet overnight last night when patient was staying with son.  Daughter reported to me today that she was also off her baseline and fatigue throughout the day today.  Unable to confirm review of systems due to patient's altered mentation.  ED Course: Vital signs in the ED notable for blood pressure in the 80s to 110s systolic, baseline blood pressures in the 100s to 120s per chart review.  Fever to 100.7, respiratory rate in the teens to 20s.  Lab workup included CMP with creatinine stable 1.11, glucose 109, T. bili 2.0.  CBC with platelets 146.  PT and INR mildly elevated at 1 16.41.3 expectedly.  Lactic acid normal x 2.  Urinalysis with hemoglobin, leukocytes, many bacteria.  Urine culture and blood culture pending.  Chest x-ray without acute abnormality.  Patient received cefepime and vancomycin in the ED, also received a liter of IV fluids with a second liter ordered as well as Tylenol.  Review of Systems: Unable to get full accurate review of systems due to patient's baseline dementia.  Past Medical History:  Diagnosis Date   Atrial flutter (HCC)    a. s/p successful TEE/DCCV in 2014; b. TEE 2014 showed EF 45-50%, mild bi-atrial enlargement, mod MR   Dyspnea    History of chicken pox    Hypercholesterolemia    Hypertension    Mitral regurgitation    a. echo 2014: EF 40-45%, mildly dilated  RV, mod reduced RV systolic fxn, mod dilated LA, Mild to mod MR, mod TR, mildly elevated PASP   PAF (paroxysmal atrial fibrillation) (HCC)    a. initial episode 2011; b. not on long term anticoagulation since 06/2013   RBBB (right bundle branch block)    Seasonal allergies    Sleep apnea    a. on CPAP   Thyroid nodule 05/19/2017    Past Surgical History:  Procedure Laterality Date   ABDOMINAL HYSTERECTOMY     TONSILLECTOMY AND ADENOIDECTOMY  1947   tummy tuck      Social History  reports that she has never smoked. She has never used smokeless tobacco. She reports that she does not drink alcohol and does not use drugs.  Allergies  Allergen Reactions   Augmentin [Amoxicillin-Pot Clavulanate] Nausea And Vomiting   Codeine Anaphylaxis    "cant remember reaction"   Crestor [Rosuvastatin] Other (See Comments)    Muscle pain/cramps    Penicillins     Tolerates cephalosporins     Family History  Problem Relation Age of Onset   Stroke Mother    Heart attack Mother    Arthritis Mother    Hyperlipidemia Mother    Heart disease Mother    Diabetes Mother    Diabetes Sister    Cancer Daughter    Heart disease Daughter    Diabetes Daughter    Diabetes Son    Diabetes Maternal Grandmother    Diabetes Maternal Grandfather   Reviewed on admission  Prior to Admission medications   Medication Sig Start Date End Date Taking? Authorizing Provider  amiodarone (PACERONE) 200 MG tablet Take 1 tablet (200 mg total) by mouth daily. 05/21/22   Antonieta Iba, MD  atorvastatin (LIPITOR) 40 MG tablet Take 1 tablet (40 mg total) by mouth daily. 11/10/22   Glori Luis, MD  CVS D3 50 MCG (2000 UT) CAPS TAKE 1 CAPSULE EVERY DAY Patient taking differently: Take 10,000 Units by mouth at bedtime. 12/07/18   Glori Luis, MD  ELIQUIS 5 MG TABS tablet TAKE 1 TABLET BY MOUTH TWICE A DAY 07/17/22   Antonieta Iba, MD  fluticasone [facility]) 50 MCG/ACT nasal spray USE 2 SPRAYS IN EACH  NOSTRIL EVERY DAY 04/08/20   Glori Luis, MD  furosemide (LASIX) 20 MG tablet TAKE 1 TABLET DAILY. MAY TAKE 1 EXTRA TABLET DAILY AS NEEDED FOR WEIGHT GAIN OF 2 POUNDS OVERNIGHT OR 5 POUNDS IN 1 WEEK 05/21/22   Gollan, Tollie Pizza, MD  IBU 400 MG tablet TAKE ONE TABLET BY MOUTH EVERY 8 HOURS AS NEEDED FOR MILD PAIN Patient taking differently: Take 400 mg by mouth every 8 (eight) hours as needed for mild pain. 01/27/18   Glori Luis, MD  melatonin 5 MG TABS Take 5 mg by mouth at bedtime.    [provider]  montelukast (SINGULAIR) 10 MG tablet Take 1 tablet (10 mg total) by mouth daily. 11/10/22   Glori Luis, MD  omeprazole (PRILOSEC) 20 MG capsule Take 1 capsule (20 mg total) by mouth daily. 01/05/22   Antonieta Iba, MD  potassium chloride SA (KLOR-CON M20) 20 MEQ tablet TAKE 1 TABLET BY MOUTH EVERY DAY 07/06/22   Antonieta Iba, MD  traZODone (DESYREL) 50 MG tablet Take 0.5 tablets (25 mg total) by mouth at bedtime as needed for sleep. 08/26/22   Glori Luis, MD  vitamin E 400 UNIT capsule Take 400 Units by mouth daily.    [provider]    Physical Exam: Vitals:   12/25/22 1330 12/25/22 1345 12/25/22 1415 12/25/22 1500  BP: (!) 128/56 100/66 (!) 98/45 (!) 96/34  Pulse: 76 77 71 71  Resp: 20 20 13  (!) 21  Temp:      TempSrc:      SpO2: 97% 100% 96% 100%  Height:        Physical Exam Constitutional:      General: She is not in acute distress.    Appearance: Normal appearance.  HENT:     Head: Normocephalic and atraumatic.     Mouth/Throat:     Mouth: Mucous membranes are moist.     Pharynx: Oropharynx is clear.  Eyes:     Extraocular Movements: Extraocular movements intact.     Pupils: Pupils are equal, round, and reactive to light.  Cardiovascular:     Rate and Rhythm: Normal rate and regular rhythm.     Pulses: Normal pulses.     Heart sounds: Normal heart sounds.  Pulmonary:     Effort: Pulmonary effort is normal. No  respiratory distress.     Breath sounds: Normal breath sounds.  Abdominal:     General: Bowel sounds are normal. There is no distension.     Palpations: Abdomen is soft.     Tenderness: There is no abdominal tenderness.  Musculoskeletal:        General: No swelling or deformity.  Skin:    General: Skin is warm and dry.  Neurological:  General: No focal deficit present.     Mental Status: Mental status is at baseline.    Labs on Admission: I have personally reviewed following labs and imaging studies  CBC: Recent Labs  Lab 12/25/22 1232  WBC 6.8  NEUTROABS 6.1  HGB 13.8  HCT 42.3  MCV 98.4  PLT 146*    Basic Metabolic Panel: Recent Labs  Lab 12/25/22 1232  NA 137  K 3.7  CL 102  CO2 26  GLUCOSE 109*  BUN 12  CREATININE 1.11*  CALCIUM 9.2    GFR: CrCl cannot be calculated (Unknown ideal weight.).  Liver Function Tests: Recent Labs  Lab 12/25/22 1232  AST 31  ALT 29  ALKPHOS 72  BILITOT 2.0*  PROT 7.2  ALBUMIN 3.8    Urine analysis:    Component Value Date/Time   COLORURINE AMBER (A) 12/25/2022 1537   APPEARANCEUR CLOUDY (A) 12/25/2022 1537   APPEARANCEUR Hazy 05/05/2012 0014   LABSPEC 1.018 12/25/2022 1537   LABSPEC 1.017 05/05/2012 0014   PHURINE 6.0 12/25/2022 1537   GLUCOSEU NEGATIVE 12/25/2022 1537   GLUCOSEU NEGATIVE 06/04/2021 1134   HGBUR MODERATE (A) 12/25/2022 1537   BILIRUBINUR NEGATIVE 12/25/2022 1537   BILIRUBINUR neg 06/09/2021 1621   BILIRUBINUR Negative 05/05/2012 0014   KETONESUR NEGATIVE 12/25/2022 1537   PROTEINUR NEGATIVE 12/25/2022 1537   UROBILINOGEN 0.2 06/09/2021 1621   UROBILINOGEN 0.2 06/04/2021 1134   NITRITE NEGATIVE 12/25/2022 1537   LEUKOCYTESUR MODERATE (A) 12/25/2022 1537   LEUKOCYTESUR 3+ 05/05/2012 0014    Radiological Exams on Admission: DG Chest 2 View  Result Date: 12/25/2022 CLINICAL DATA:  Suspected sepsis. EXAM: CHEST - 2 VIEW COMPARISON:  Chest radiograph 12/16/2020. FINDINGS: No  consolidation or pulmonary edema. Stable cardiac and mediastinal contours with large hiatal hernia and adjacent atelectasis in the left lower lobe. No pleural effusion or pneumothorax. IMPRESSION: 1.  No evidence of acute cardiopulmonary disease. 2. Large hiatal hernia. Electronically Signed   By: Orvan Falconer M.D.   On: 12/25/2022 12:58    EKG: Independently reviewed.  Sinus rhythm at 78 bpm.  Low voltage multiple leads.  Nonspecific T wave flattening.  Baseline artifact.  Right bundle branch block.  Similar to previous.  Assessment/Plan Principal Problem:   UTI (urinary tract infection) Active Problems:   Atrial fibrillation (HCC)   Hypercholesterolemia   Obstructive sleep apnea on CPAP   Neuropathy   Fatty liver   Chronic systolic CHF (congestive heart failure) (HCC)   Late onset Alzheimer's dementia with behavioral disturbance (HCC)   UTI ?Early Sepsis > Patient presenting with some altered mental status on her chronic dementia with fatigue and unsteadiness on her feet. > Noted to have a fever on presentation in the ED 200.7.  No leukocytosis but may be slower to mount this at 89 years old given symptoms have been ongoing for just 1 to 2 days. > Chest x-ray clear.  Urinalysis came back with hemoglobin, leukocytes and many bacteria suspicious for possible UTI etiology. > Blood pressure borderline in the ED in the 80s to 90s largely systolic.  Manual check by EDP maintain blood pressure in the 90s systolic.  Outpatient blood pressures have been primarily in the 100s to 110s systolic per chart review. > At the time of my exam.  Blood pressure now consistently in the 90s to 100s.  Improved after repositioning of cuff. > Lactic acid normal.  Received 1 L with a second liter going in.  Initially received vancomycin and cefepime while  workup was ongoing. - Monitor in progressive unit - Continue IV fluids, close monitoring with CHF history - Trend fever curve and WBC - Narrow antibiotics to  ceftriaxone only - Follow-up urine cultures and blood cultures  Chronic diastolic CHF > Last echo in 2022 with EF 60-65%, G2 DD, mild RV EF reduction. - Holding Lasix in the setting of low normal blood pressure and possible early sepsis as above.  Atrial fibrillation - Currently sinus rhythm - Continue home Eliquis  Hyperlipidemia - Continue home atorvastatin  Alzheimer dementia - Noted  OSA - Nasal pillow and oxygen therapy at night previously, not currently wearing per chart review.  DVT prophylaxis: Eliquis Code Status:   Full Family Communication:  Updated at bedside Disposition Plan:   Patient is from:  Home  Anticipated DC to:  Home  Anticipated DC date:  1 to 3 days  Anticipated DC barriers: None  Consults called:  None Admission status:  Observation, progressive  Severity of Illness: The appropriate patient status for this patient is OBSERVATION. Observation status is judged to be reasonable and necessary in order to provide the required intensity of service to ensure the patient's safety. The patient's presenting symptoms, physical exam findings, and initial radiographic and laboratory data in the context of their medical condition is felt to place them at decreased risk for further clinical deterioration. Furthermore, it is anticipated that the patient will be medically stable for discharge from the hospital within 2 midnights of admission.    Synetta Fail MD [facility]  How to contact the [facility] Attending or Consulting provider 7A - 7P or covering provider during after hours 7P -7A, for this patient?   Check the care team in [facility] and look for a) attending/consulting [facility] provider listed and b) the [facility] team listed Log into [email or web address] and use [place]'s universal password to access. If you do not have the password, please contact the hospital operator. Locate the [facility] provider you are looking for under [facility] and page to a number that you  can be directly reached. If you still have difficulty reaching the provider, please page the [facility] (Director on Call) for the Hospitalists listed on amion for assistance.  12/25/2022, 4:16 PM

## 2022-12-25 NOTE — ED Triage Notes (Signed)
Pt arrives via POV with Daughter. PT has dementia. Daughter states since last night, she has not been acting her normal self. Has been lethargic, weak, and has swelling to lower legs.

## 2022-12-25 NOTE — ED Notes (Signed)
ED TO INPATIENT HANDOFF REPORT  ED Nurse Name and Phone #: [phone]  S Name/Age/Gender Jill Snow 89 y.o. female Room/Bed: 032C/032C  Code Status   Code Status: Full Code  Home/SNF/Other Home Patient oriented to: self and place Is this baseline? Yes   Triage Complete: Triage complete  Chief Complaint UTI (urinary tract infection) [N39.0]  Triage Note Pt arrives via POV with Daughter. PT has dementia. Daughter states since last night, she has not been acting her normal self. Has been lethargic, weak, and has swelling to lower legs.   Allergies Allergies  Allergen Reactions   Augmentin [Amoxicillin-Pot Clavulanate] Nausea And Vomiting   Codeine Anaphylaxis    "cant remember reaction"   Crestor [Rosuvastatin] Other (See Comments)    Muscle pain/cramps    Penicillins     Tolerates cephalosporins     Level of Care/Admitting Diagnosis ED Disposition     ED Disposition  Admit   Condition  --   Comment  Hospital Area: [facility] [[id number]]  Level of Care: Progressive [102]  Admit to Progressive based on following criteria: MULTISYSTEM THREATS such as stable sepsis, metabolic/electrolyte imbalance with or without encephalopathy that is responding to early treatment.  May place patient in observation at [facility] or [facility] if equivalent level of care is available:: No  Covid Evaluation: Asymptomatic - no recent exposure (last 10 days) testing not required  Diagnosis: UTI (urinary tract infection) [960454]  Admitting Physician: Synetta Fail [[id number]]  Attending Physician: Synetta Fail [[id number]]          B Medical/Surgery History Past Medical History:  Diagnosis Date   Atrial flutter (HCC)    a. s/p successful TEE/DCCV in 2014; b. TEE 2014 showed EF 45-50%, mild bi-atrial enlargement, mod MR   Dyspnea    History of chicken pox    Hypercholesterolemia    Hypertension    Mitral regurgitation    a. echo 2014: EF 40-45%,  mildly dilated RV, mod reduced RV systolic fxn, mod dilated LA, Mild to mod MR, mod TR, mildly elevated PASP   PAF (paroxysmal atrial fibrillation) (HCC)    a. initial episode 2011; b. not on long term anticoagulation since 06/2013   RBBB (right bundle branch block)    Seasonal allergies    Sleep apnea    a. on CPAP   Thyroid nodule 05/19/2017   Past Surgical History:  Procedure Laterality Date   ABDOMINAL HYSTERECTOMY     TONSILLECTOMY AND ADENOIDECTOMY  1947   tummy tuck       A IV Location/Drains/Wounds Patient Lines/Drains/Airways Status     Active Line/Drains/Airways     Name Placement date Placement time Site Days   Peripheral IV 12/25/22 20 G Anterior;Right Forearm 12/25/22  1308  Forearm  less than 1   Wound / Incision (Open or Dehisced) 12/16/20 Other (Comment) Arm Anterior;Lower;Bilateral Scabbed scratches 12/16/20  1830  Arm  739            Intake/Output Last 24 hours No intake or output data in the 24 hours ending 12/25/22 1703  Labs/Imaging Results for orders placed or performed during the hospital encounter of 12/25/22 (from the past 48 hour(s))  Comprehensive metabolic panel     Status: Abnormal   Collection Time: 12/25/22 12:32 PM  Result Value Ref Range   Sodium 137 135 - 145 mmol/L   Potassium 3.7 3.5 - 5.1 mmol/L   Chloride 102 98 - 111 mmol/L   CO2 26 22 -  32 mmol/L   Glucose, Bld 109 (H) 70 - 99 mg/dL    Comment: Glucose reference range applies only to samples taken after fasting for at least 8 hours.   BUN 12 8 - 23 mg/dL   Creatinine, Ser 1.61 (H) 0.44 - 1.00 mg/dL   Calcium 9.2 8.9 - 09.6 mg/dL   Total Protein 7.2 6.5 - 8.1 g/dL   Albumin 3.8 3.5 - 5.0 g/dL   AST 31 15 - 41 U/L   ALT 29 0 - 44 U/L   Alkaline Phosphatase 72 38 - 126 U/L   Total Bilirubin 2.0 (H) 0.3 - 1.2 mg/dL   GFR, Estimated 48 (L) >60 mL/min    Comment: (NOTE) Calculated using the CKD-EPI Creatinine Equation (2021)    Anion gap 9 5 - 15    Comment: Performed at  [facility], [address], [place], [place] [address]  Lactic acid, plasma     Status: None   Collection Time: 12/25/22 12:32 PM  Result Value Ref Range   Lactic Acid, Venous 1.2 0.5 - 1.9 mmol/L    Comment: Performed at [facility], [address], [place], [place] [address]  CBC with Differential     Status: Abnormal   Collection Time: 12/25/22 12:32 PM  Result Value Ref Range   WBC 6.8 4.0 - 10.5 K/uL   RBC 4.30 3.87 - 5.11 MIL/uL   Hemoglobin 13.8 12.0 - 15.0 g/dL   HCT 14.7 82.9 - 56.2 %   MCV 98.4 80.0 - 100.0 fL   MCH 32.1 26.0 - 34.0 pg   MCHC 32.6 30.0 - 36.0 g/dL   RDW 13.0 86.5 - 78.4 %   Platelets 146 (L) 150 - 400 K/uL   nRBC 0.0 0.0 - 0.2 %   Neutrophils Relative % 89 %   Neutro Abs 6.1 1.7 - 7.7 K/uL   Lymphocytes Relative 4 %   Lymphs Abs 0.2 (L) 0.7 - 4.0 K/uL   Monocytes Relative 6 %   Monocytes Absolute 0.4 0.1 - 1.0 K/uL   Eosinophils Relative 0 %   Eosinophils Absolute 0.0 0.0 - 0.5 K/uL   Basophils Relative 0 %   Basophils Absolute 0.0 0.0 - 0.1 K/uL   Immature Granulocytes 1 %   Abs Immature Granulocytes 0.04 0.00 - 0.07 K/uL    Comment: Performed at [facility], [address], [place], [place] [address]  Protime-INR     Status: Abnormal   Collection Time: 12/25/22 12:32 PM  Result Value Ref Range   Prothrombin Time 16.4 (H) 11.4 - 15.2 seconds   INR 1.3 (H) 0.8 - 1.2    Comment: (NOTE) INR goal varies based on device and disease states. Performed at [facility], [address] [place], [place] [address]   Lactic acid, plasma     Status: None   Collection Time: 12/25/22  1:53 PM  Result Value Ref Range   Lactic Acid, Venous 1.3 0.5 - 1.9 mmol/L    Comment: Performed at [facility], [address] [place], [place] [address]  Urinalysis, w/ Reflex to Culture (Infection Suspected) -Urine, Clean Catch     Status: Abnormal   Collection Time: 12/25/22  3:37 PM  Result Value Ref Range   Specimen Source URINE,  CATHETERIZED    Color, Urine AMBER (A) YELLOW    Comment: BIOCHEMICALS MAY BE AFFECTED BY COLOR   APPearance CLOUDY (A) CLEAR   Specific Gravity, Urine  1.018 1.005 - 1.030   pH 6.0 5.0 - 8.0   Glucose, UA NEGATIVE NEGATIVE mg/dL   Hgb urine dipstick MODERATE (A) NEGATIVE   Bilirubin Urine NEGATIVE NEGATIVE   Ketones, ur NEGATIVE NEGATIVE mg/dL   Protein, ur NEGATIVE NEGATIVE mg/dL   Nitrite NEGATIVE NEGATIVE   Leukocytes,Ua MODERATE (A) NEGATIVE   RBC / HPF >50 0 - 5 RBC/hpf   WBC, UA >50 0 - 5 WBC/hpf    Comment:        Reflex urine culture not performed if WBC <=10, OR if Squamous epithelial cells >5. If Squamous epithelial cells >5 suggest recollection.    Bacteria, UA MANY (A) NONE SEEN   Squamous Epithelial / HPF 0-5 0 - 5 /HPF   Mucus PRESENT     Comment: Performed at [facility], [address], [place], [place] [address]   DG Chest 2 View  Result Date: 12/25/2022 CLINICAL DATA:  Suspected sepsis. EXAM: CHEST - 2 VIEW COMPARISON:  Chest radiograph 12/16/2020. FINDINGS: No consolidation or pulmonary edema. Stable cardiac and mediastinal contours with large hiatal hernia and adjacent atelectasis in the left lower lobe. No pleural effusion or pneumothorax. IMPRESSION: 1.  No evidence of acute cardiopulmonary disease. 2. Large hiatal hernia. Electronically Signed   By: Orvan Falconer M.D.   On: 12/25/2022 12:58    Pending Labs Unresulted Labs (From admission, onward)     Start     Ordered   12/26/22 0500  Comprehensive metabolic panel  Tomorrow morning,   R        12/25/22 1615   12/26/22 0500  CBC  Tomorrow morning,   R        12/25/22 1615   12/25/22 1537  Urine Culture  Once,   R        12/25/22 1537   12/25/22 1218  Culture, blood (Routine x 2)  BLOOD CULTURE X 2,   R (with STAT occurrences)      12/25/22 1218            Vitals/Pain Today's Vitals   12/25/22 1500 12/25/22 1615 12/25/22 1623 12/25/22 1630  BP: (!) 96/34 (!) 102/53  (!) 99/53  Pulse:  71 82  82  Resp: (!) 21 19  16   Temp:   98.6 F (37 C)   TempSrc:   Oral   SpO2: 100% 99%  100%  Height:      PainSc:        Isolation Precautions No active isolations  Medications Medications  vancomycin (VANCOREADY) IVPB 1250 mg/250 mL (1,250 mg Intravenous New Bag/Given 12/25/22 1659)  ceFEPIme (MAXIPIME) 2 g in sodium chloride 0.9 % 100 mL IVPB (2 g Intravenous New Bag/Given 12/25/22 1656)  cefTRIAXone (ROCEPHIN) 2 g in sodium chloride 0.9 % 100 mL IVPB (has no administration in time range)  amiodarone (PACERONE) tablet 200 mg (has no administration in time range)  atorvastatin (LIPITOR) tablet 40 mg (has no administration in time range)  pantoprazole (PROTONIX) EC tablet 40 mg (has no administration in time range)  apixaban (ELIQUIS) tablet 5 mg (has no administration in time range)  melatonin tablet 5 mg (has no administration in time range)  sodium chloride flush (NS) 0.9 % injection 3 mL (has no administration in time range)  acetaminophen (TYLENOL) tablet 650 mg (has no administration in time range)    Or  acetaminophen (TYLENOL) suppository 650 mg (has no administration in time range)  polyethylene glycol (MIRALAX / GLYCOLAX) packet 17 g (  has no administration in time range)  acetaminophen (TYLENOL) tablet 1,000 mg (1,000 mg Oral Given 12/25/22 1327)  sodium chloride 0.9 % bolus 1,000 mL (1,000 mLs Intravenous New Bag/Given 12/25/22 1327)  sodium chloride 0.9 % bolus 1,000 mL (1,000 mLs Intravenous New Bag/Given 12/25/22 1700)    Mobility walks     Focused Assessments Cardiac Assessment Handoff:    Lab Results  Component Value Date   CKTOTAL 42 05/14/2013   CKMB 1.9 05/14/2013   TROPONINI < 0.02 05/14/2013   No results found for: "DDIMER" Does the Patient currently have chest pain? No   ,    R Recommendations: See Admitting Provider Note  Report given to:   Additional Notes:

## 2022-12-25 NOTE — ED Notes (Signed)
Floor notified pt is being brought to the floor.

## 2022-12-25 NOTE — ED Provider Notes (Signed)
[place] EMERGENCY DEPARTMENT AT [facility] Provider Note   CSN: [id number] Arrival date & time: 12/25/22  1157     History  Chief Complaint  Patient presents with   Weakness   Fatigue   Fever    Jill Snow is a 89 y.o. female.  89 year old female with prior medical history detailed below presents with her daughter for evaluation.  Patient with history of dementia.  Per daughter, patient with increased weakness and mild lethargy since yesterday.  Patient seems to be somewhat unsteady on her feet.  Patient was noted to have a fever on arrival in triage to 100.7.  Patient without recent cough or congestion.  Patient without complaint of pain.  Patient reportedly taking decent p.o. at home.  The history is provided by the patient and medical records.       Home Medications Prior to Admission medications   Medication Sig Start Date End Date Taking? Authorizing Provider  amiodarone (PACERONE) 200 MG tablet Take 1 tablet (200 mg total) by mouth daily. 05/21/22   Antonieta Iba, MD  atorvastatin (LIPITOR) 40 MG tablet Take 1 tablet (40 mg total) by mouth daily. 11/10/22   Glori Luis, MD  [employer] D3 50 MCG (2000 UT) CAPS TAKE 1 CAPSULE EVERY DAY Patient taking differently: Take 10,000 Units by mouth at bedtime. 12/07/18   Glori Luis, MD  ELIQUIS 5 MG TABS tablet TAKE 1 TABLET BY MOUTH TWICE A DAY 07/17/22   Antonieta Iba, MD  fluticasone [facility]) 50 MCG/ACT nasal spray USE 2 SPRAYS IN EACH NOSTRIL EVERY DAY 04/08/20   Glori Luis, MD  furosemide (LASIX) 20 MG tablet TAKE 1 TABLET DAILY. MAY TAKE 1 EXTRA TABLET DAILY AS NEEDED FOR WEIGHT GAIN OF 2 POUNDS OVERNIGHT OR 5 POUNDS IN 1 WEEK 05/21/22   Gollan, Tollie Pizza, MD  IBU 400 MG tablet TAKE ONE TABLET BY MOUTH EVERY 8 HOURS AS NEEDED FOR MILD PAIN Patient taking differently: Take 400 mg by mouth every 8 (eight) hours as needed for mild pain. 01/27/18   Glori Luis, MD  melatonin 5 MG  TABS Take 5 mg by mouth at bedtime.    [provider]  montelukast (SINGULAIR) 10 MG tablet Take 1 tablet (10 mg total) by mouth daily. 11/10/22   Glori Luis, MD  omeprazole (PRILOSEC) 20 MG capsule Take 1 capsule (20 mg total) by mouth daily. 01/05/22   Antonieta Iba, MD  potassium chloride SA (KLOR-CON M20) 20 MEQ tablet TAKE 1 TABLET BY MOUTH EVERY DAY 07/06/22   Antonieta Iba, MD  traZODone (DESYREL) 50 MG tablet Take 0.5 tablets (25 mg total) by mouth at bedtime as needed for sleep. 08/26/22   Glori Luis, MD  vitamin E 400 UNIT capsule Take 400 Units by mouth daily.    [provider]      Allergies    Augmentin [amoxicillin-pot clavulanate], Codeine, Crestor [rosuvastatin], and Penicillins    Review of Systems   Review of Systems  All other systems reviewed and are negative.   Physical Exam Updated Vital Signs BP 100/66 (BP Location: Right Arm)   Pulse 77   Temp (!) 100.7 F (38.2 C) (Oral)   Resp 20   Ht 5\' 8"  (1.727 m)   SpO2 100%   BMI 21.47 kg/m  Physical Exam Vitals and nursing note reviewed.  Constitutional:      General: She is not in acute distress.  Appearance: Normal appearance. She is well-developed.  HENT:     Head: Normocephalic and atraumatic.  Eyes:     Conjunctiva/sclera: Conjunctivae normal.     Pupils: Pupils are equal, round, and reactive to light.  Cardiovascular:     Rate and Rhythm: Regular rhythm.     Heart sounds: Normal heart sounds.  Pulmonary:     Effort: Pulmonary effort is normal. No respiratory distress.     Breath sounds: Normal breath sounds.  Abdominal:     General: There is no distension.     Palpations: Abdomen is soft.     Tenderness: There is no abdominal tenderness.  Musculoskeletal:        General: No deformity. Normal range of motion.     Cervical back: Normal range of motion and neck supple.  Skin:    General: Skin is warm and dry.  Neurological:     General: No focal deficit  present.     Mental Status: She is alert.     Comments: Alert, pleasantly confused, mildly demented     ED Results / Procedures / Treatments   Labs (all labs ordered are listed, but only abnormal results are displayed) Labs Reviewed  COMPREHENSIVE METABOLIC PANEL - Abnormal; Notable for the following components:      Result Value   Glucose, Bld 109 (*)    Creatinine, Ser 1.11 (*)    Total Bilirubin 2.0 (*)    GFR, Estimated 48 (*)    All other components within normal limits  CBC WITH DIFFERENTIAL/PLATELET - Abnormal; Notable for the following components:   Platelets 146 (*)    Lymphs Abs 0.2 (*)    All other components within normal limits  PROTIME-INR - Abnormal; Notable for the following components:   Prothrombin Time 16.4 (*)    INR 1.3 (*)    All other components within normal limits  CULTURE, BLOOD (ROUTINE X 2)  CULTURE, BLOOD (ROUTINE X 2)  LACTIC ACID, PLASMA  LACTIC ACID, PLASMA  URINALYSIS, W/ REFLEX TO CULTURE (INFECTION SUSPECTED)    EKG EKG Interpretation  Date/Time:  Friday Dec 25 2022 12:05:09 EDT Ventricular Rate:  78 PR Interval:  186 QRS Duration: 132 QT Interval:  398 QTC Calculation: 453 R Axis:   45 Text Interpretation: Normal sinus rhythm Right bundle branch block Abnormal ECG When compared with ECG of 16-Dec-2020 10:13, PREVIOUS ECG IS PRESENT Confirmed by Kristine Royal [phone]) on 12/25/2022 12:33:53 PM  Radiology DG Chest 2 View  Result Date: 12/25/2022 CLINICAL DATA:  Suspected sepsis. EXAM: CHEST - 2 VIEW COMPARISON:  Chest radiograph 12/16/2020. FINDINGS: No consolidation or pulmonary edema. Stable cardiac and mediastinal contours with large hiatal hernia and adjacent atelectasis in the left lower lobe. No pleural effusion or pneumothorax. IMPRESSION: 1.  No evidence of acute cardiopulmonary disease. 2. Large hiatal hernia. Electronically Signed   By: Orvan Falconer M.D.   On: 12/25/2022 12:58    Procedures Procedures    Medications  Ordered in ED Medications  acetaminophen (TYLENOL) tablet 1,000 mg (1,000 mg Oral Given 12/25/22 1327)  sodium chloride 0.9 % bolus 1,000 mL (1,000 mLs Intravenous New Bag/Given 12/25/22 1327)    ED Course/ Medical Decision Making/ A&P                             Medical Decision Making Amount and/or Complexity of Data Reviewed Labs: ordered. Radiology: ordered.  Risk OTC drugs. Decision regarding hospitalization.  Medical Screen Complete  This patient presented to the ED with complaint of weakness, fever.  This complaint involves an extensive number of treatment options. The initial differential diagnosis includes, but is not limited to, bacterial versus viral infection, metabolic abnormality  This presentation is: Acute, Self-Limited, Previously Undiagnosed, Uncertain Prognosis, Complicated, Systemic Symptoms, and Threat to Life/Bodily Function  Patient is presenting with weakness, mild lethargy.  Patient is febrile on arrival.    Presentation is consistent and concerning for likely infection.  UTI is strongly suspected.  Screening labs obtained demonstrate normal white count, normal lactic acid, normal renal function.  Patient's UA is concerning for UTI.  Broad-spectrum antibiotics administered.    Patient would benefit from admission for further workup and treatment.  Hospitalist service is aware of case.  Additional history obtained:  External records from outside sources obtained and reviewed including prior ED visits and prior Inpatient records.    Lab Tests:  I ordered and personally interpreted labs.  The pertinent results include: CBC, CMP, lactic acid, UA   Imaging Studies ordered:  I ordered imaging studies including chest x-ray I independently visualized and interpreted obtained imaging which showed NAD I agree with the radiologist interpretation.   Cardiac Monitoring:  The patient was maintained on a cardiac monitor.  I personally viewed and  interpreted the cardiac monitor which showed an underlying rhythm of: NSR   Medicines ordered:  I ordered medication including IV fluids, antibiotics for infection Reevaluation of the patient after these medicines showed that the patient: improved   Problem List / ED Course:  Fever, UTI  Reevaluation:  After the interventions noted above, I reevaluated the patient and found that they have: improved   Disposition:  After consideration of the diagnostic results and the patients response to treatment, I feel that the patent would benefit from admission.          Final Clinical Impression(s) / ED Diagnoses Final diagnoses:  Fever, unspecified fever cause  Weakness    Rx / DC Orders ED Discharge Orders     None         Wynetta Fines, MD 12/25/22 1622

## 2022-12-26 DIAGNOSIS — N390 Urinary tract infection, site not specified: Secondary | ICD-10-CM

## 2022-12-26 DIAGNOSIS — G9341 Metabolic encephalopathy: Secondary | ICD-10-CM | POA: Diagnosis present

## 2022-12-26 DIAGNOSIS — I5022 Chronic systolic (congestive) heart failure: Secondary | ICD-10-CM | POA: Diagnosis present

## 2022-12-26 DIAGNOSIS — I340 Nonrheumatic mitral (valve) insufficiency: Secondary | ICD-10-CM | POA: Diagnosis present

## 2022-12-26 DIAGNOSIS — G629 Polyneuropathy, unspecified: Secondary | ICD-10-CM | POA: Diagnosis present

## 2022-12-26 DIAGNOSIS — Z83438 Family history of other disorder of lipoprotein metabolism and other lipidemia: Secondary | ICD-10-CM | POA: Diagnosis not present

## 2022-12-26 DIAGNOSIS — K760 Fatty (change of) liver, not elsewhere classified: Secondary | ICD-10-CM | POA: Diagnosis present

## 2022-12-26 DIAGNOSIS — F02818 Dementia in other diseases classified elsewhere, unspecified severity, with other behavioral disturbance: Secondary | ICD-10-CM | POA: Diagnosis present

## 2022-12-26 DIAGNOSIS — Z90710 Acquired absence of both cervix and uterus: Secondary | ICD-10-CM | POA: Diagnosis not present

## 2022-12-26 DIAGNOSIS — A419 Sepsis, unspecified organism: Secondary | ICD-10-CM | POA: Diagnosis present

## 2022-12-26 DIAGNOSIS — A4151 Sepsis due to Escherichia coli [E. coli]: Secondary | ICD-10-CM | POA: Diagnosis present

## 2022-12-26 DIAGNOSIS — Z8249 Family history of ischemic heart disease and other diseases of the circulatory system: Secondary | ICD-10-CM | POA: Diagnosis not present

## 2022-12-26 DIAGNOSIS — Z7901 Long term (current) use of anticoagulants: Secondary | ICD-10-CM | POA: Diagnosis not present

## 2022-12-26 DIAGNOSIS — Z833 Family history of diabetes mellitus: Secondary | ICD-10-CM | POA: Diagnosis not present

## 2022-12-26 DIAGNOSIS — R2681 Unsteadiness on feet: Secondary | ICD-10-CM | POA: Diagnosis present

## 2022-12-26 DIAGNOSIS — Z880 Allergy status to penicillin: Secondary | ICD-10-CM | POA: Diagnosis not present

## 2022-12-26 DIAGNOSIS — Z8261 Family history of arthritis: Secondary | ICD-10-CM | POA: Diagnosis not present

## 2022-12-26 DIAGNOSIS — Z823 Family history of stroke: Secondary | ICD-10-CM | POA: Diagnosis not present

## 2022-12-26 DIAGNOSIS — R6520 Severe sepsis without septic shock: Secondary | ICD-10-CM | POA: Diagnosis present

## 2022-12-26 DIAGNOSIS — I480 Paroxysmal atrial fibrillation: Secondary | ICD-10-CM | POA: Diagnosis present

## 2022-12-26 DIAGNOSIS — I110 Hypertensive heart disease with heart failure: Secondary | ICD-10-CM | POA: Diagnosis present

## 2022-12-26 DIAGNOSIS — E7800 Pure hypercholesterolemia, unspecified: Secondary | ICD-10-CM | POA: Diagnosis present

## 2022-12-26 DIAGNOSIS — G4733 Obstructive sleep apnea (adult) (pediatric): Secondary | ICD-10-CM | POA: Diagnosis present

## 2022-12-26 DIAGNOSIS — Z888 Allergy status to other drugs, medicaments and biological substances status: Secondary | ICD-10-CM | POA: Diagnosis not present

## 2022-12-26 DIAGNOSIS — G301 Alzheimer's disease with late onset: Secondary | ICD-10-CM | POA: Diagnosis present

## 2022-12-26 LAB — COMPREHENSIVE METABOLIC PANEL
ALT: 26 U/L (ref 0–44)
AST: 28 U/L (ref 15–41)
Albumin: 2.9 g/dL — ABNORMAL LOW (ref 3.5–5.0)
Alkaline Phosphatase: 57 U/L (ref 38–126)
Anion gap: 8 (ref 5–15)
BUN: 13 mg/dL (ref 8–23)
CO2: 25 mmol/L (ref 22–32)
Calcium: 8.8 mg/dL — ABNORMAL LOW (ref 8.9–10.3)
Chloride: 104 mmol/L (ref 98–111)
Creatinine, Ser: 0.93 mg/dL (ref 0.44–1.00)
GFR, Estimated: 59 mL/min — ABNORMAL LOW (ref >60)
Glucose, Bld: 102 mg/dL — ABNORMAL HIGH (ref 70–99)
Potassium: 3.3 mmol/L — ABNORMAL LOW (ref 3.5–5.1)
Sodium: 137 mmol/L (ref 135–145)
Total Bilirubin: 1.4 mg/dL — ABNORMAL HIGH (ref 0.3–1.2)
Total Protein: 5.8 g/dL — ABNORMAL LOW (ref 6.5–8.1)

## 2022-12-26 LAB — CBC
HCT: 36 % (ref 36.0–46.0)
Hemoglobin: 12 g/dL (ref 12.0–15.0)
MCH: 32.3 pg (ref 26.0–34.0)
MCHC: 33.3 g/dL (ref 30.0–36.0)
MCV: 96.8 fL (ref 80.0–100.0)
Platelets: 116 10*3/uL — ABNORMAL LOW (ref 150–400)
RBC: 3.72 MIL/uL — ABNORMAL LOW (ref 3.87–5.11)
RDW: 12.6 % (ref 11.5–15.5)
WBC: 5 10*3/uL (ref 4.0–10.5)
nRBC: 0 % (ref 0.0–0.2)

## 2022-12-26 LAB — CULTURE, BLOOD (ROUTINE X 2)

## 2022-12-26 MED ORDER — POTASSIUM CHLORIDE CRYS ER 20 MEQ PO TBCR
40.0000 meq | EXTENDED_RELEASE_TABLET | ORAL | Status: AC
  Administered 2022-12-26 (×2): 40 meq via ORAL
  Filled 2022-12-26 (×2): qty 2

## 2022-12-26 MED ORDER — POTASSIUM CHLORIDE 20 MEQ PO PACK: 40.0000 meq | PACK | ORAL | Status: DC

## 2022-12-26 NOTE — Care Management Obs Status (Signed)
MEDICARE OBSERVATION STATUS NOTIFICATION   Patient Details  Name: Jill Snow MRN: [id number] Date of Birth: [birth year]   Medicare Observation Status Notification Given:  Yes    Gordy Clement, RN 12/26/2022, 12:27 PM

## 2022-12-26 NOTE — Progress Notes (Signed)
PROGRESS NOTE    Jill Snow  OZH:[id number] DOB: 12-31-1933 DOA: 12/25/2022 PCP: Glori Luis, MD  Outpatient Specialists:     Brief Narrative:  Patient is an 89 year old female with past medical history significant for Alzheimer's dementia, congestive heart failure, chronic cellulitis, hyperlipidemia, fatty liver, atrial fibrillation, OSA, neuropathy and low back pain.  Patient was admitted with UTI/sepsis with associated encephalopathy.  Cultures are pending.  Patient is currently on IV ceftriaxone.  Low potassium is noted today (3.3).  Patient has bilateral lower extremity edema, query chronic.  12/26/2022: Patient seen alongside patient's nurse.  Encephalopathy seems to be resolving.  No new complaints today.  Patient is not a particularly great historian.   Assessment & Plan:   Principal Problem:   UTI (urinary tract infection) Active Problems:   Atrial fibrillation (HCC)   Hypercholesterolemia   Obstructive sleep apnea on CPAP   Neuropathy   Fatty liver   Chronic systolic CHF (congestive heart failure) (HCC)   Late onset Alzheimer's dementia with behavioral disturbance (HCC)   AMS (altered mental status)   UTI/sepsis: > Patient presenting with some altered mental status on her chronic dementia with fatigue and unsteadiness on her feet. > Noted to have a fever on presentation in the ED 100.7. >No leukocytosis but may be slower to mount this at 89 years old given symptoms have been ongoing for just 1 to 2 days. > Chest x-ray clear.   >Urinalysis came back with hemoglobin, leukocytes and many bacteria suspicious for possible UTI etiology. > Blood pressure borderline in the ED in the 80s to 90s largely systolic.  Manual check by EDP maintain blood pressure in the 90s systolic.  Outpatient blood pressures have been primarily in the 100s to 110s systolic per chart review. -Final culture results are pending. -Patient is currently on IV Rocephin. -Sepsis physiology  seems to be resolving. -Encephalopathy has improved significantly.     Chronic diastolic CHF > Last echo in 2022 with EF 60-65%, G2 DD, mild RV EF reduction. - Holding Lasix in the setting of low normal blood pressure and possible early sepsis as above.   Atrial fibrillation - Currently sinus rhythm - Continue home Eliquis   Hyperlipidemia - Continue home atorvastatin   Alzheimer dementia - Noted   OSA - Nasal pillow and oxygen therapy at night previously, not currently wearing per chart review.     DVT prophylaxis: Eliquis. Code Status: Full code. Family Communication:  Disposition Plan: This will depend on hospital course.   Consultants:  None.  Procedures:  None.  Antimicrobials:  IV Rocephin.   Subjective: -Patient has not particularly good historian. -No new complaints. -Confusion is improving.  Objective: Vitals:   12/26/22 0354 12/26/22 0500 12/26/22 0733 12/26/22 1238  BP: (!) 116/54  130/63 (!) 105/43  Pulse:      Resp: 18  16 16   Temp: (!) 97.4 F (36.3 C)  97.9 F (36.6 C) 98.9 F (37.2 C)  TempSrc: Oral  Oral Oral  SpO2:      Weight:  68.3 kg    Height:        Intake/Output Summary (Last 24 hours) at 12/26/2022 1544 Last data filed at 12/26/2022 0000 Gross per 24 hour  Intake 1352.69 ml  Output --  Net 1352.69 ml   Filed Weights   12/25/22 1749 12/26/22 0500  Weight: 68.9 kg 68.3 kg    Examination:  General exam: Appears calm and comfortable  Respiratory system: Clear to auscultation.  Cardiovascular system:  S1 & S2 heard Gastrointestinal system: Abdomen is soft and nontender.   Central nervous system: Alert and oriented.  Extremities: 2+ bilateral lower extremity edema.    Data Reviewed: I have personally reviewed following labs and imaging studies  CBC: Recent Labs  Lab 12/25/22 1232 12/26/22 0106  WBC 6.8 5.0  NEUTROABS 6.1  --   HGB 13.8 12.0  HCT 42.3 36.0  MCV 98.4 96.8  PLT 146* 116*   Basic Metabolic  Panel: Recent Labs  Lab 12/25/22 1232 12/26/22 0106  NA 137 137  K 3.7 3.3*  CL 102 104  CO2 26 25  GLUCOSE 109* 102*  BUN 12 13  CREATININE 1.11* 0.93  CALCIUM 9.2 8.8*   GFR: Estimated Creatinine Clearance: 43 mL/min (by C-G formula based on SCr of 0.93 mg/dL). Liver Function Tests: Recent Labs  Lab 12/25/22 1232 12/26/22 0106  AST 31 28  ALT 29 26  ALKPHOS 72 57  BILITOT 2.0* 1.4*  PROT 7.2 5.8*  ALBUMIN 3.8 2.9*   No results for input(s): "LIPASE", "AMYLASE" in the last 168 hours. No results for input(s): "AMMONIA" in the last 168 hours. Coagulation Profile: Recent Labs  Lab 12/25/22 1232  INR 1.3*   Cardiac Enzymes: No results for input(s): "CKTOTAL", "CKMB", "CKMBINDEX", "TROPONINI" in the last 168 hours. BNP (last 3 results) Recent Labs    08/26/22 0828  PROBNP 59.0   HbA1C: No results for input(s): "HGBA1C" in the last 72 hours. CBG: No results for input(s): "GLUCAP" in the last 168 hours. Lipid Profile: No results for input(s): "CHOL", "HDL", "LDLCALC", "TRIG", "CHOLHDL", "LDLDIRECT" in the last 72 hours. Thyroid Function Tests: No results for input(s): "TSH", "T4TOTAL", "FREET4", "T3FREE", "THYROIDAB" in the last 72 hours. Anemia Panel: No results for input(s): "VITAMINB12", "FOLATE", "FERRITIN", "TIBC", "IRON", "RETICCTPCT" in the last 72 hours. Urine analysis:    Component Value Date/Time   COLORURINE AMBER (A) 12/25/2022 1537   APPEARANCEUR CLOUDY (A) 12/25/2022 1537   APPEARANCEUR Hazy 05/05/2012 0014   LABSPEC 1.018 12/25/2022 1537   LABSPEC 1.017 05/05/2012 0014   PHURINE 6.0 12/25/2022 1537   GLUCOSEU NEGATIVE 12/25/2022 1537   GLUCOSEU NEGATIVE 06/04/2021 1134   HGBUR MODERATE (A) 12/25/2022 1537   BILIRUBINUR NEGATIVE 12/25/2022 1537   BILIRUBINUR neg 06/09/2021 1621   BILIRUBINUR Negative 05/05/2012 0014   KETONESUR NEGATIVE 12/25/2022 1537   PROTEINUR NEGATIVE 12/25/2022 1537   UROBILINOGEN 0.2 06/09/2021 1621   UROBILINOGEN  0.2 06/04/2021 1134   NITRITE NEGATIVE 12/25/2022 1537   LEUKOCYTESUR MODERATE (A) 12/25/2022 1537   LEUKOCYTESUR 3+ 05/05/2012 0014   Sepsis Labs: @LABRCNTIP (procalcitonin:4,lacticidven:4)  ) Recent Results (from the past 240 hour(s))  Culture, blood (Routine x 2)     Status: None (Preliminary result)   Collection Time: 12/25/22 12:18 PM   Specimen: BLOOD  Result Value Ref Range Status   Specimen Description BLOOD RIGHT ANTECUBITAL  Final   Special Requests   Final    BOTTLES DRAWN AEROBIC AND ANAEROBIC Blood Culture adequate volume   Culture   Final    NO GROWTH 1 DAY Performed at [facility], [address], [place], [place] [address]    Report Status PENDING  Incomplete  Culture, blood (Routine x 2)     Status: None (Preliminary result)   Collection Time: 12/25/22 12:23 PM   Specimen: BLOOD  Result Value Ref Range Status   Specimen Description BLOOD SITE NOT SPECIFIED  Final   Special Requests   Final    BOTTLES  DRAWN AEROBIC AND ANAEROBIC Blood Culture results may not be optimal due to an inadequate volume of blood received in culture bottles   Culture   Final    NO GROWTH 1 DAY Performed at [facility], [address] [place], [place] [address]    Report Status PENDING  Incomplete  Urine Culture     Status: None (Preliminary result)   Collection Time: 12/25/22  3:37 PM   Specimen: Urine, Clean Catch  Result Value Ref Range Status   Specimen Description URINE, CLEAN CATCH  Final   Special Requests   Final    NONE Reflexed from [id number] Performed at [facility], [address] [place], [place] [address]    Culture PENDING  Incomplete   Report Status PENDING  Incomplete         Radiology Studies: DG Chest 2 View  Result Date: 12/25/2022 CLINICAL DATA:  Suspected sepsis. EXAM: CHEST - 2 VIEW COMPARISON:  Chest radiograph 12/16/2020. FINDINGS: No consolidation or pulmonary edema. Stable cardiac and mediastinal contours with large hiatal  hernia and adjacent atelectasis in the left lower lobe. No pleural effusion or pneumothorax. IMPRESSION: 1.  No evidence of acute cardiopulmonary disease. 2. Large hiatal hernia. Electronically Signed   By: Orvan Falconer M.D.   On: 12/25/2022 12:58        Scheduled Meds:  amiodarone  200 mg Oral Daily   apixaban  5 mg Oral BID   atorvastatin  40 mg Oral Daily   melatonin  5 mg Oral QHS   pantoprazole  40 mg Oral Daily   sodium chloride flush  3 mL Intravenous Q12H   Continuous Infusions:  cefTRIAXone (ROCEPHIN)  IV 2 g (12/25/22 2056)     LOS: 0 days    Time spent: 55 minutes.    Berton Mount, MD  [facility] Pager #: [phone] 7PM-7AM contact night coverage as above

## 2022-12-27 DIAGNOSIS — N390 Urinary tract infection, site not specified: Secondary | ICD-10-CM | POA: Diagnosis not present

## 2022-12-27 DIAGNOSIS — A419 Sepsis, unspecified organism: Secondary | ICD-10-CM | POA: Diagnosis not present

## 2022-12-27 LAB — CBC WITH DIFFERENTIAL/PLATELET
Abs Immature Granulocytes: 0.01 10*3/uL (ref 0.00–0.07)
Basophils Absolute: 0 10*3/uL (ref 0.0–0.1)
Basophils Relative: 1 %
Eosinophils Absolute: 0.1 10*3/uL (ref 0.0–0.5)
Eosinophils Relative: 3 %
HCT: 35.6 % — ABNORMAL LOW (ref 36.0–46.0)
Hemoglobin: 11.9 g/dL — ABNORMAL LOW (ref 12.0–15.0)
Immature Granulocytes: 0 %
Lymphocytes Relative: 19 %
Lymphs Abs: 0.8 10*3/uL (ref 0.7–4.0)
MCH: 32.3 pg (ref 26.0–34.0)
MCHC: 33.4 g/dL (ref 30.0–36.0)
MCV: 96.7 fL (ref 80.0–100.0)
Monocytes Absolute: 0.5 10*3/uL (ref 0.1–1.0)
Monocytes Relative: 12 %
Neutro Abs: 2.7 10*3/uL (ref 1.7–7.7)
Neutrophils Relative %: 65 %
Platelets: 118 10*3/uL — ABNORMAL LOW (ref 150–400)
RBC: 3.68 MIL/uL — ABNORMAL LOW (ref 3.87–5.11)
RDW: 12.6 % (ref 11.5–15.5)
WBC: 4.3 10*3/uL (ref 4.0–10.5)
nRBC: 0 % (ref 0.0–0.2)

## 2022-12-27 LAB — RENAL FUNCTION PANEL
Albumin: 2.8 g/dL — ABNORMAL LOW (ref 3.5–5.0)
Anion gap: 9 (ref 5–15)
BUN: 16 mg/dL (ref 8–23)
CO2: 24 mmol/L (ref 22–32)
Calcium: 9 mg/dL (ref 8.9–10.3)
Chloride: 107 mmol/L (ref 98–111)
Creatinine, Ser: 1.07 mg/dL — ABNORMAL HIGH (ref 0.44–1.00)
GFR, Estimated: 50 mL/min — ABNORMAL LOW (ref >60)
Glucose, Bld: 107 mg/dL — ABNORMAL HIGH (ref 70–99)
Phosphorus: 3.5 mg/dL (ref 2.5–4.6)
Potassium: 3.8 mmol/L (ref 3.5–5.1)
Sodium: 140 mmol/L (ref 135–145)

## 2022-12-27 LAB — MAGNESIUM: Magnesium: 2.1 mg/dL (ref 1.7–2.4)

## 2022-12-27 LAB — URINE CULTURE: Culture: >=100000 — AB

## 2022-12-27 LAB — CULTURE, BLOOD (ROUTINE X 2)

## 2022-12-27 MED ORDER — POLYETHYLENE GLYCOL 3350 17 G PO PACK: 17.0000 g | PACK | Freq: Every day | ORAL | 0 refills | Status: AC | PRN

## 2022-12-27 MED ORDER — CEPHALEXIN 500 MG PO CAPS: 500.0000 mg | ORAL_CAPSULE | Freq: Four times a day (QID) | ORAL | 0 refills | Status: AC

## 2022-12-27 MED ORDER — SODIUM CHLORIDE 0.9 % IV SOLN: 2.0000 g | Freq: Once | INTRAVENOUS | Status: DC

## 2022-12-27 MED ORDER — PANTOPRAZOLE SODIUM 40 MG PO TBEC: 40.0000 mg | DELAYED_RELEASE_TABLET | Freq: Every day | ORAL | 0 refills | Status: DC

## 2022-12-27 NOTE — Evaluation (Signed)
Physical Therapy Evaluation and Discharge Patient Details Name: Jill Snow MRN: [id number] DOB: Jun 24, 1934 Today's Date: 12/27/2022  History of Present Illness  89 year old female admitted 12/25/22 with UTI/sepsis with associated encephalopathy and unsteadiness on her feet.  Past medical history significant for Alzheimer's dementia, congestive heart failure, chronic cellulitis, hyperlipidemia, fatty liver, atrial fibrillation, OSA, neuropathy and low back pain.  Clinical Impression   Patient evaluated by Physical Therapy with no further acute PT needs identified. All education has been completed and the patient/daughter have no further questions. Patient able to ambulate in hallway and up 20 steps with minguard assist. Daughter feels she is close to baseline cognitively and physically. No followup PT or DME needs.  PT is signing off. Thank you for this referral.        Recommendations for follow up therapy are one component of a multi-disciplinary discharge planning process, led by the attending physician.  Recommendations may be updated based on patient status, additional functional criteria and insurance authorization.  Follow Up Recommendations       Assistance Recommended at Discharge Frequent or constant Supervision/Assistance  Patient can return home with the following  A little help with walking and/or transfers;A little help with bathing/dressing/bathroom;Assistance with cooking/housework;Direct supervision/assist for medications management;Direct supervision/assist for financial management;Assist for transportation;Help with stairs or ramp for entrance    Equipment Recommendations None recommended by PT  Recommendations for Other Services       Functional Status Assessment Patient has not had a recent decline in their functional status     Precautions / Restrictions Precautions Precautions: Fall      Mobility  Bed Mobility Overal bed mobility: Modified  Independent             General bed mobility comments: HOB slightly elevated; in/out of bed without assist    Transfers Overall transfer level: Independent Equipment used: None               General transfer comment: from bed and standard toilet    Ambulation/Gait Ambulation/Gait assistance: Min guard Gait Distance (Feet): 200 Feet Assistive device: None Gait Pattern/deviations: Step-through pattern, Drifts right/left   Gait velocity interpretation: 1.31 - 2.62 ft/sec, indicative of limited community ambulator   General Gait Details: occasionally drifting to rt or lt, no overt LOB or assist needed but guarding for safety  Stairs Stairs: Yes Stairs assistance: Min guard Stair Management: Two rails, Alternating pattern, Forwards Number of Stairs: 20 General stair comments: no difficulties, required rest at top before descending  Wheelchair Mobility    Modified Rankin (Stroke Patients Only)       Balance Overall balance assessment: Mild deficits observed, not formally tested                                           Pertinent Vitals/Pain Pain Assessment Pain Assessment: No/denies pain    Home Living Family/patient expects to be discharged to:: Private residence Living Arrangements: Children Available Help at Discharge: Family;Available 24 hours/day Type of Home: House Home Access: Stairs to enter Entrance Stairs-Rails: Right Entrance Stairs-Number of Steps: 2   Home Layout: One level Home Equipment: None Additional Comments: home information for son's home (primary residence); does spend one day at each of her daughter's homes and one lives in 3rd floor apartmetn    Prior Function Prior Level of Function : Needs assist  Cognitive Assist : Mobility (cognitive);ADLs (  cognitive) Mobility (Cognitive): Set up cues ADLs (Cognitive): Intermittent cues       Mobility Comments: no physical assist needed (minguard on steps) ADLs  Comments: assist with shower for effectiveness and safety     Hand Dominance        Extremity/Trunk Assessment   Upper Extremity Assessment Upper Extremity Assessment: Overall WFL for tasks assessed    Lower Extremity Assessment Lower Extremity Assessment: Overall WFL for tasks assessed    Cervical / Trunk Assessment Cervical / Trunk Assessment: Normal  Communication   Communication: No difficulties  Cognition Arousal/Alertness: Awake/alert Behavior During Therapy: WFL for tasks assessed/performed Overall Cognitive Status: History of cognitive impairments - at baseline                                 General Comments: daughter present and reports she is basically back to her baseline level of dementia        General Comments General comments (skin integrity, edema, etc.): Daughter present    Exercises     Assessment/Plan    PT Assessment Patient does not need any further PT services  PT Problem List         PT Treatment Interventions      PT Goals (Current goals can be found in the Care Plan section)  Acute Rehab PT Goals Patient Stated Goal: go home today PT Goal Formulation: All assessment and education complete, DC therapy    Frequency       Co-evaluation               AM-PAC PT "6 Clicks" Mobility  Outcome Measure Help needed turning from your back to your side while in a flat bed without using bedrails?: None Help needed moving from lying on your back to sitting on the side of a flat bed without using bedrails?: None Help needed moving to and from a bed to a chair (including a wheelchair)?: None Help needed standing up from a chair using your arms (e.g., wheelchair or bedside chair)?: None Help needed to walk in hospital room?: A Little Help needed climbing 3-5 steps with a railing? : A Little 6 Click Score: 22    End of Session Equipment Utilized During Treatment: Gait belt Activity Tolerance: Patient tolerated treatment  well Patient left: in bed;with call bell/phone within reach;with bed alarm set;with family/visitor present Nurse Communication: Mobility status;Other (comment) (ok to d/c from PT perspective) PT Visit Diagnosis: Difficulty in walking, not elsewhere classified (R26.2)    Time: 6213-0865 PT Time Calculation (min) (ACUTE ONLY): 20 min   Charges:   PT Evaluation $PT Eval Low Complexity: 1 Low           Jerolyn Center, PT Acute Rehabilitation Services  Office [phone]   Zena Amos 12/27/2022, 1:06 PM

## 2022-12-27 NOTE — Discharge Summary (Signed)
Physician Discharge Summary  Patient ID: Jill Snow MRN: [id number] DOB/AGE: 01-26-1934 89 y.o.  Admit date: 12/25/2022 Discharge date: 12/27/2022  Admission Diagnoses:  Discharge Diagnoses:  Principal Problem:   Sepsis secondary to UTI (urinary tract infection) Active Problems:   Atrial fibrillation (HCC)   Hypercholesterolemia   Obstructive sleep apnea on CPAP   Neuropathy   Fatty liver   Chronic systolic CHF (congestive heart failure) (HCC)   Late onset Alzheimer's dementia with behavioral disturbance (HCC)   AMS (altered mental status)   Sepsis secondary to UTI [facility])   Discharged Condition: stable  Hospital Course:  Patient is an 89 year old female with past medical history significant for Alzheimer's dementia, congestive heart failure, chronic cellulitis, hyperlipidemia, fatty liver, atrial fibrillation, OSA, neuropathy and low back pain.  Patient was admitted with UTI/sepsis, with associated encephalopathy.  Urine culture grew E. coli.  Patient has been on IV ceftriaxone.  Patient will complete course of antibiotics with Keflex.  Sepsis physiology has resolved.  Altered mental status has resolved.  Patient will be discharged back home today.  Physical therapy saw patient prior to discharge and no PT needs.     UTI/sepsis: > Patient presented with altered mental status on her chronic dementia, with fatigue and unsteadiness on her feet. > Noted to have a fever on presentation in the ED 100.7. >No leukocytosis but may be slower to mount this at 89 years old given symptoms have been ongoing for just 1 to 2 days. > Chest x-ray: clear.   >Urinalysis came back with hemoglobin, leukocytes and many bacteria suspicious for possible UTI etiology. -Urine culture grew E. coli. > Blood pressure borderline in the ED in the 80s to 90s largely systolic.  Manual check by EDP maintain blood pressure in the 90s systolic.  Outpatient blood pressures have been primarily in the 100s to 110s  systolic per chart review. -Patient is currently on IV Rocephin. -Patient will be discharged back home on oral Keflex. -Sepsis physiology seems to be resolving. -Encephalopathy has resolved.      Chronic diastolic CHF > Last echo in 2022 with EF 60-65%, G2 DD, mild RV EF reduction.   Atrial fibrillation - Currently sinus rhythm - Continue home Eliquis   Hyperlipidemia - Continue home atorvastatin   Alzheimer dementia - Noted   OSA - Nasal pillow and oxygen therapy at night previously, not currently wearing per chart review.        Consults: None  Significant Diagnostic Studies:  -Urine culture grew E. coli.   Treatments:  -IV antibiotics (Rocephin).  Will transition to oral antibiotics on discharge.  Discharge Exam: Blood pressure (!) 113/51, pulse 77, temperature 97.8 F (36.6 C), temperature source Axillary, resp. rate 16, height 5\' 8"  (1.727 m), weight 69.7 kg, SpO2 100 %.   Disposition: Discharge disposition: 01-Home or Self Care       Discharge Instructions     Diet - low sodium heart healthy   Complete by: As directed    Increase activity slowly   Complete by: As directed       Allergies as of 12/27/2022       Reactions   Augmentin [amoxicillin-pot Clavulanate] Nausea And Vomiting   Codeine Anaphylaxis   Crestor [rosuvastatin] Other (See Comments)   Myalgias    Penicillins Other (See Comments)   Unknown reaction Tolerates cephalosporins         Medication List     STOP taking these medications    Melatonin 10 MG Tabs  omeprazole 20 MG capsule Commonly known as: PRILOSEC Replaced by: pantoprazole 40 MG tablet   traZODone 50 MG tablet Commonly known as: DESYREL       TAKE these medications    amiodarone 200 MG tablet Commonly known as: PACERONE Take 1 tablet (200 mg total) by mouth daily.   atorvastatin 40 MG tablet Commonly known as: LIPITOR Take 1 tablet (40 mg total) by mouth daily.   cephALEXin 500 MG  capsule Commonly known as: KEFLEX Take 1 capsule (500 mg total) by mouth 4 (four) times daily for 2 days.   Eliquis 5 MG Tabs tablet Generic drug: apixaban TAKE 1 TABLET BY MOUTH TWICE A DAY What changed:  how much to take when to take this   furosemide 20 MG tablet Commonly known as: LASIX TAKE 1 TABLET DAILY. MAY TAKE 1 EXTRA TABLET DAILY AS NEEDED FOR WEIGHT GAIN OF 2 POUNDS OVERNIGHT OR 5 POUNDS IN 1 WEEK What changed:  how much to take how to take this when to take this additional instructions   Klor-Con M20 20 MEQ tablet Generic drug: potassium chloride SA TAKE 1 TABLET BY MOUTH EVERY DAY What changed: when to take this   montelukast 10 MG tablet Commonly known as: SINGULAIR Take 1 tablet (10 mg total) by mouth daily. What changed: when to take this   pantoprazole 40 MG tablet Commonly known as: PROTONIX Take 1 tablet (40 mg total) by mouth daily. Start taking on: Dec 28, 2022 Replaces: omeprazole 20 MG capsule   polyethylene glycol 17 g packet Commonly known as: MIRALAX / GLYCOLAX Take 17 g by mouth daily as needed for mild constipation.   VITAMIN D-3 PO Take 1 capsule by mouth at bedtime.   VITAMIN E PO Take 1 capsule by mouth daily.         SignedBarnetta Chapel 12/27/2022, 2:11 PM

## 2022-12-28 ENCOUNTER — Telehealth: Payer: Self-pay | Admitting: *Deleted

## 2022-12-28 NOTE — Transitions of Care (Post Inpatient/ED Visit) (Signed)
12/28/2022  Name: Jill Snow MRN: [id number] DOB: August 11, 1934  Today's TOC FU Call Status: Today's TOC FU Call Status:: Successful TOC FU Call Competed TOC FU Call Complete Date: 12/28/22  Transition Care Management Follow-up Telephone Call Date of Discharge: 12/27/22 Discharge [facility] Type of Discharge: Inpatient Admission Primary Inpatient Discharge Diagnosis:: (urinary tract infection How have you been since you were released from the hospital?: Better Any questions or concerns?: No  Items Reviewed: Did you receive and understand the discharge instructions provided?: Yes Medications obtained,verified, and reconciled?: Yes (Medications Reviewed) Any new allergies since your discharge?: No Dietary orders reviewed?: No Do you have support at home?: Yes People in Home: child(ren), adult (Patient rotates staying with lisa daughter, Marylene Land daughter and bobby son) Name of Support/Comfort Primary Source: Alcario Drought and Reita Cliche children  Medications Reviewed Today: Medications Reviewed Today     Reviewed by Luella Cook, RN (Case Manager) on 12/28/22 at 1410  Med List Status: <None>   Medication Order Taking? Sig Documenting Provider Last Dose Status Informant  amiodarone (PACERONE) 200 MG tablet [id number] Yes Take 1 tablet (200 mg total) by mouth daily. Antonieta Iba, MD Taking Active Child, Pharmacy Records  atorvastatin (LIPITOR) 40 MG tablet [id number] Yes Take 1 tablet (40 mg total) by mouth daily. Glori Luis, MD Taking Active Child, Pharmacy Records  cephALEXin [facility]) 500 MG capsule [id number] Yes Take 1 capsule (500 mg total) by mouth 4 (four) times daily for 2 days. Barnetta Chapel, MD Taking Active   Cholecalciferol (VITAMIN D-3 PO) [id number] Yes Take 1 capsule by mouth at bedtime. [provider] Taking Active Child  ELIQUIS 5 MG TABS tablet [id number] Yes TAKE 1 TABLET BY MOUTH TWICE A DAY  Patient taking differently:  Take 5 mg by mouth in the morning and at bedtime.   Antonieta Iba, MD Taking Active Child, Pharmacy Records  furosemide (LASIX) 20 MG tablet [id number] Yes TAKE 1 TABLET DAILY. MAY TAKE 1 EXTRA TABLET DAILY AS NEEDED FOR WEIGHT GAIN OF 2 POUNDS OVERNIGHT OR 5 POUNDS IN 1 WEEK  Patient taking differently: Take 20 mg by mouth daily.   Antonieta Iba, MD Taking Active Child, Pharmacy Records  montelukast (SINGULAIR) 10 MG tablet [id number] Yes Take 1 tablet (10 mg total) by mouth daily.  Patient taking differently: Take 10 mg by mouth at bedtime.   Glori Luis, MD Taking Active Child, Pharmacy Records  pantoprazole (PROTONIX) 40 MG tablet [id number] Yes Take 1 tablet (40 mg total) by mouth daily. Barnetta Chapel, MD Taking Active   polyethylene glycol (MIRALAX / GLYCOLAX) 17 g packet [id number] Yes Take 17 g by mouth daily as needed for mild constipation. Barnetta Chapel, MD Taking Active   potassium chloride SA (KLOR-CON M20) 20 MEQ tablet [id number] Yes TAKE 1 TABLET BY MOUTH EVERY DAY  Patient taking differently: Take 20 mEq by mouth at bedtime.   Antonieta Iba, MD Taking Active Child, Pharmacy Records  VITAMIN E PO [id number] Yes Take 1 capsule by mouth daily. [provider] Taking Active Child            Home Care and Equipment/Supplies: Were Home Health Services Ordered?: NA Any new equipment or medical supplies ordered?: NA  Functional Questionnaire: Do you need assistance with bathing/showering or dressing?: Yes Do you need assistance with meal preparation?: Yes Do you need assistance with eating?: No Do you have difficulty maintaining continence: No Do you need assistance with getting  out of bed/getting out of a chair/moving?: No Do you have difficulty managing or taking your medications?: Yes  Follow up appointments reviewed: PCP Follow-up appointment confirmed?: No (Daughter will call and make appt around her schedule of keeping  grandchildren) MD Provider Line [phone] Given: Yes Specialist Hospital Follow-up appointment confirmed?: NA Do you need transportation to your follow-up appointment?: No Do you understand care options if your condition(s) worsen?: Yes-patient verbalized understanding  SDOH Interventions Today    Flowsheet Row Most Recent Value  SDOH Interventions   Food Insecurity Interventions Intervention Not Indicated  Housing Interventions Intervention Not Indicated  Transportation Interventions Intervention Not Indicated      Interventions Today    Flowsheet Row Most Recent Value  General Interventions   General Interventions Discussed/Reviewed General Interventions Discussed, General Interventions Reviewed, Doctor Visits  Doctor Visits Discussed/Reviewed Doctor Visits Discussed  Pharmacy Interventions   Pharmacy Dicussed/Reviewed Pharmacy Topics Discussed, Pharmacy Topics Reviewed      TOC Interventions Today    Flowsheet Row Most Recent Value  TOC Interventions   TOC Interventions Discussed/Reviewed TOC Interventions Discussed, TOC Interventions Reviewed        Gean Maidens BSN RN [facility] [phone]

## 2022-12-30 ENCOUNTER — Telehealth: Payer: Self-pay | Admitting: Family Medicine

## 2022-12-30 LAB — CULTURE, BLOOD (ROUTINE X 2)
Culture: NO GROWTH
Culture: NO GROWTH
Special Requests: ADEQUATE

## 2022-12-30 NOTE — Telephone Encounter (Signed)
Pt daughter would like to be called regarding the pt  

## 2022-12-31 NOTE — Telephone Encounter (Signed)
Called Misty Stanley Patient's daughter and let her know that the Keflex was the appropriate antibiotic based on the urine culture and that we would see her on 01/05/23.

## 2022-12-31 NOTE — Telephone Encounter (Signed)
It looks like they discharged her on keflex. That should be an appropriate antibiotic for the e coli based on her culture result.

## 2023-01-05 ENCOUNTER — Ambulatory Visit (INDEPENDENT_AMBULATORY_CARE_PROVIDER_SITE_OTHER): Payer: Medicare HMO | Admitting: Family Medicine

## 2023-01-05 ENCOUNTER — Encounter: Payer: Self-pay | Admitting: Family Medicine

## 2023-01-05 VITALS — BP 106/82 | HR 63 | Temp 97.5°F | Ht 68.0 in | Wt 148.4 lb

## 2023-01-05 DIAGNOSIS — D696 Thrombocytopenia, unspecified: Secondary | ICD-10-CM | POA: Insufficient documentation

## 2023-01-05 DIAGNOSIS — N3001 Acute cystitis with hematuria: Secondary | ICD-10-CM | POA: Diagnosis not present

## 2023-01-05 LAB — CBC WITH DIFFERENTIAL/PLATELET
Basophils Absolute: 0.1 10*3/uL (ref 0.0–0.1)
Basophils Relative: 1 % (ref 0.0–3.0)
Eosinophils Absolute: 0.2 10*3/uL (ref 0.0–0.7)
Eosinophils Relative: 3.7 % (ref 0.0–5.0)
HCT: 40.3 % (ref 36.0–46.0)
Hemoglobin: 13.3 g/dL (ref 12.0–15.0)
Lymphocytes Relative: 18.1 % (ref 12.0–46.0)
Lymphs Abs: 1 10*3/uL (ref 0.7–4.0)
MCHC: 33 g/dL (ref 30.0–36.0)
MCV: 99 fl (ref 78.0–100.0)
Monocytes Absolute: 0.4 10*3/uL (ref 0.1–1.0)
Monocytes Relative: 6.9 % (ref 3.0–12.0)
Neutro Abs: 3.9 10*3/uL (ref 1.4–7.7)
Neutrophils Relative %: 70.3 % (ref 43.0–77.0)
Platelets: 262 10*3/uL (ref 150.0–400.0)
RBC: 4.08 Mil/uL (ref 3.87–5.11)
RDW: 12.8 % (ref 11.5–15.5)
WBC: 5.6 10*3/uL (ref 4.0–10.5)

## 2023-01-05 LAB — BASIC METABOLIC PANEL
BUN: 12 mg/dL (ref 6–23)
CO2: 26 mEq/L (ref 19–32)
Calcium: 9.5 mg/dL (ref 8.4–10.5)
Chloride: 104 mEq/L (ref 96–112)
Creatinine, Ser: 0.77 mg/dL (ref 0.40–1.20)
GFR: 69.5 mL/min (ref >60.00)
Glucose, Bld: 131 mg/dL — ABNORMAL HIGH (ref 70–99)
Potassium: 3.8 mEq/L (ref 3.5–5.1)
Sodium: 139 mEq/L (ref 135–145)

## 2023-01-05 NOTE — Assessment & Plan Note (Signed)
Recheck platelets today.  Suspect low platelets were related to her infection.

## 2023-01-05 NOTE — Assessment & Plan Note (Signed)
They report significant improvement.  She does still have some urinary symptoms and thus we will check a urinalysis today and treat for infection if she appears to still have infection.  They will seek medical attention again if she develops recurrent symptoms of systemic illness.

## 2023-01-05 NOTE — Progress Notes (Signed)
Marikay Alar, MD Phone: [phone]  Jill Snow is a 89 y.o. female who presents today for hospital follow-up.  UTI/sepsis: Patient's daughter reports the patient has been lethargic and not feeling well and having balance issues so she took her to the emergency department.  She was found to have a UTI and sepsis.  She had encephalopathy related to this.  She was treated with antibiotics in the hospital and discharged on antibiotics.  She has completed those antibiotics.  She notes rare dysuria at this point.  She is peeing a little more frequently than usual.  No urgency though she is having some incontinence that has improved since her UTI was treated.  Platelets were noted to be mildly low during her hospitalization.  Patient and her daughter both report she seems to be doing quite a bit better since being treated for the UTI.  She does report having had some abdominal pain when she was in the hospital.  Social History   Tobacco Use  Smoking Status Never  Smokeless Tobacco Never    Current Outpatient Medications on File Prior to Visit  Medication Sig Dispense Refill   amiodarone (PACERONE) 200 MG tablet Take 1 tablet (200 mg total) by mouth daily. 90 tablet 3   atorvastatin (LIPITOR) 40 MG tablet Take 1 tablet (40 mg total) by mouth daily. 90 tablet 1   Cholecalciferol (VITAMIN D-3 PO) Take 1 capsule by mouth at bedtime.     ELIQUIS 5 MG TABS tablet TAKE 1 TABLET BY MOUTH TWICE A DAY (Patient taking differently: Take 5 mg by mouth in the morning and at bedtime.) 60 tablet 5   furosemide (LASIX) 20 MG tablet TAKE 1 TABLET DAILY. MAY TAKE 1 EXTRA TABLET DAILY AS NEEDED FOR WEIGHT GAIN OF 2 POUNDS OVERNIGHT OR 5 POUNDS IN 1 WEEK (Patient taking differently: Take 20 mg by mouth daily.) 90 tablet 3   montelukast (SINGULAIR) 10 MG tablet Take 1 tablet (10 mg total) by mouth daily. (Patient taking differently: Take 10 mg by mouth at bedtime.) 90 tablet 1   pantoprazole (PROTONIX) 40  MG tablet Take 1 tablet (40 mg total) by mouth daily. 30 tablet 0   polyethylene glycol (MIRALAX / GLYCOLAX) 17 g packet Take 17 g by mouth daily as needed for mild constipation. 14 each 0   potassium chloride SA (KLOR-CON M20) 20 MEQ tablet TAKE 1 TABLET BY MOUTH EVERY DAY (Patient taking differently: Take 20 mEq by mouth at bedtime.) 90 tablet 2   VITAMIN E PO Take 1 capsule by mouth daily.     No current facility-administered medications on file prior to visit.     ROS see history of present illness  Objective  Physical Exam Vitals:   01/05/23 1114  BP: 106/82  Pulse: 63  Temp: (!) 97.5 F (36.4 C)  SpO2: 99%    BP Readings from Last 3 Encounters:  01/05/23 106/82  12/27/22 (!) 113/51  11/10/22 114/74   Wt Readings from Last 3 Encounters:  01/05/23 148 lb 6.4 oz (67.3 kg)  12/27/22 153 lb 10.6 oz (69.7 kg)  11/10/22 141 lb 3.2 oz (64 kg)    Physical Exam Constitutional:      General: She is not in acute distress.    Appearance: She is not diaphoretic.  Cardiovascular:     Rate and Rhythm: Normal rate and regular rhythm.     Heart sounds: Normal heart sounds.  Pulmonary:     Effort: Pulmonary effort is normal.  Breath sounds: Normal breath sounds.  Abdominal:     General: Bowel sounds are normal. There is no distension.     Palpations: Abdomen is soft.     Tenderness: There is no abdominal tenderness.  Skin:    General: Skin is warm and dry.  Neurological:     Mental Status: She is alert.      Assessment/Plan: Please see individual problem list.  Acute cystitis with hematuria Assessment & Plan: They report significant improvement.  She does still have some urinary symptoms and thus we will check a urinalysis today and treat for infection if she appears to still have infection.  They will seek medical attention again if she develops recurrent symptoms of systemic illness.  Orders: -     CBC with Differential/Platelet -     Basic metabolic panel -      POCT urinalysis dipstick  Thrombocytopenia (HCC) Assessment & Plan: Recheck platelets today.  Suspect low platelets were related to her infection.      Return for as scheduled.   Marikay Alar, MD [facility]

## 2023-01-09 ENCOUNTER — Other Ambulatory Visit: Payer: Self-pay | Admitting: Family Medicine

## 2023-02-08 DIAGNOSIS — S8001XA Contusion of right knee, initial encounter: Secondary | ICD-10-CM | POA: Diagnosis not present

## 2023-02-08 DIAGNOSIS — S7001XA Contusion of right hip, initial encounter: Secondary | ICD-10-CM | POA: Diagnosis not present

## 2023-02-10 ENCOUNTER — Encounter (HOSPITAL_COMMUNITY): Payer: Self-pay

## 2023-02-10 ENCOUNTER — Emergency Department (HOSPITAL_COMMUNITY): Payer: Medicare HMO

## 2023-02-10 ENCOUNTER — Inpatient Hospital Stay (HOSPITAL_COMMUNITY)
Admission: EM | Admit: 2023-02-10 | Discharge: 2023-02-14 | DRG: 813 | Disposition: A | Discharge status: Home Health | Payer: Medicare HMO | Attending: Internal Medicine | Admitting: Internal Medicine

## 2023-02-10 ENCOUNTER — Emergency Department (HOSPITAL_BASED_OUTPATIENT_CLINIC_OR_DEPARTMENT_OTHER): Payer: Medicare HMO

## 2023-02-10 ENCOUNTER — Other Ambulatory Visit: Payer: Self-pay

## 2023-02-10 ENCOUNTER — Telehealth: Payer: Self-pay

## 2023-02-10 DIAGNOSIS — M160 Bilateral primary osteoarthritis of hip: Secondary | ICD-10-CM | POA: Diagnosis not present

## 2023-02-10 DIAGNOSIS — Z7901 Long term (current) use of anticoagulants: Secondary | ICD-10-CM

## 2023-02-10 DIAGNOSIS — G309 Alzheimer's disease, unspecified: Secondary | ICD-10-CM | POA: Diagnosis not present

## 2023-02-10 DIAGNOSIS — Z1629 Resistance to other single specified antibiotic: Secondary | ICD-10-CM | POA: Diagnosis present

## 2023-02-10 DIAGNOSIS — Z833 Family history of diabetes mellitus: Secondary | ICD-10-CM | POA: Diagnosis not present

## 2023-02-10 DIAGNOSIS — R9389 Abnormal findings on diagnostic imaging of other specified body structures: Secondary | ICD-10-CM | POA: Diagnosis not present

## 2023-02-10 DIAGNOSIS — F02C0 Dementia in other diseases classified elsewhere, severe, without behavioral disturbance, psychotic disturbance, mood disturbance, and anxiety: Secondary | ICD-10-CM | POA: Diagnosis present

## 2023-02-10 DIAGNOSIS — Z881 Allergy status to other antibiotic agents status: Secondary | ICD-10-CM

## 2023-02-10 DIAGNOSIS — T45515A Adverse effect of anticoagulants, initial encounter: Secondary | ICD-10-CM | POA: Diagnosis present

## 2023-02-10 DIAGNOSIS — I110 Hypertensive heart disease with heart failure: Secondary | ICD-10-CM | POA: Diagnosis not present

## 2023-02-10 DIAGNOSIS — G9340 Encephalopathy, unspecified: Secondary | ICD-10-CM

## 2023-02-10 DIAGNOSIS — Z885 Allergy status to narcotic agent status: Secondary | ICD-10-CM

## 2023-02-10 DIAGNOSIS — R609 Edema, unspecified: Secondary | ICD-10-CM

## 2023-02-10 DIAGNOSIS — D62 Acute posthemorrhagic anemia: Secondary | ICD-10-CM | POA: Diagnosis present

## 2023-02-10 DIAGNOSIS — Z823 Family history of stroke: Secondary | ICD-10-CM | POA: Diagnosis not present

## 2023-02-10 DIAGNOSIS — I480 Paroxysmal atrial fibrillation: Secondary | ICD-10-CM | POA: Diagnosis not present

## 2023-02-10 DIAGNOSIS — R4182 Altered mental status, unspecified: Secondary | ICD-10-CM | POA: Diagnosis not present

## 2023-02-10 DIAGNOSIS — S0990XA Unspecified injury of head, initial encounter: Secondary | ICD-10-CM | POA: Diagnosis not present

## 2023-02-10 DIAGNOSIS — K449 Diaphragmatic hernia without obstruction or gangrene: Secondary | ICD-10-CM | POA: Diagnosis not present

## 2023-02-10 DIAGNOSIS — I4891 Unspecified atrial fibrillation: Secondary | ICD-10-CM | POA: Diagnosis present

## 2023-02-10 DIAGNOSIS — Y92019 Unspecified place in single-family (private) house as the place of occurrence of the external cause: Secondary | ICD-10-CM

## 2023-02-10 DIAGNOSIS — Z8261 Family history of arthritis: Secondary | ICD-10-CM

## 2023-02-10 DIAGNOSIS — I5032 Chronic diastolic (congestive) heart failure: Secondary | ICD-10-CM | POA: Diagnosis not present

## 2023-02-10 DIAGNOSIS — D6832 Hemorrhagic disorder due to extrinsic circulating anticoagulants: Principal | ICD-10-CM | POA: Diagnosis present

## 2023-02-10 DIAGNOSIS — F05 Delirium due to known physiological condition: Secondary | ICD-10-CM | POA: Diagnosis not present

## 2023-02-10 DIAGNOSIS — E7800 Pure hypercholesterolemia, unspecified: Secondary | ICD-10-CM | POA: Diagnosis present

## 2023-02-10 DIAGNOSIS — Z1623 Resistance to quinolones and fluoroquinolones: Secondary | ICD-10-CM | POA: Diagnosis not present

## 2023-02-10 DIAGNOSIS — Z8249 Family history of ischemic heart disease and other diseases of the circulatory system: Secondary | ICD-10-CM | POA: Diagnosis not present

## 2023-02-10 DIAGNOSIS — Z8349 Family history of other endocrine, nutritional and metabolic diseases: Secondary | ICD-10-CM | POA: Diagnosis not present

## 2023-02-10 DIAGNOSIS — B9620 Unspecified Escherichia coli [E. coli] as the cause of diseases classified elsewhere: Secondary | ICD-10-CM | POA: Diagnosis present

## 2023-02-10 DIAGNOSIS — Z043 Encounter for examination and observation following other accident: Secondary | ICD-10-CM | POA: Diagnosis not present

## 2023-02-10 DIAGNOSIS — I051 Rheumatic mitral insufficiency: Secondary | ICD-10-CM | POA: Diagnosis present

## 2023-02-10 DIAGNOSIS — M7981 Nontraumatic hematoma of soft tissue: Secondary | ICD-10-CM | POA: Diagnosis present

## 2023-02-10 DIAGNOSIS — Z79899 Other long term (current) drug therapy: Secondary | ICD-10-CM

## 2023-02-10 DIAGNOSIS — W1830XA Fall on same level, unspecified, initial encounter: Secondary | ICD-10-CM | POA: Diagnosis present

## 2023-02-10 DIAGNOSIS — S301XXA Contusion of abdominal wall, initial encounter: Principal | ICD-10-CM | POA: Insufficient documentation

## 2023-02-10 DIAGNOSIS — Z888 Allergy status to other drugs, medicaments and biological substances status: Secondary | ICD-10-CM

## 2023-02-10 DIAGNOSIS — M1611 Unilateral primary osteoarthritis, right hip: Secondary | ICD-10-CM | POA: Diagnosis not present

## 2023-02-10 DIAGNOSIS — W19XXXA Unspecified fall, initial encounter: Secondary | ICD-10-CM | POA: Diagnosis present

## 2023-02-10 DIAGNOSIS — N390 Urinary tract infection, site not specified: Secondary | ICD-10-CM | POA: Diagnosis present

## 2023-02-10 DIAGNOSIS — S199XXA Unspecified injury of neck, initial encounter: Secondary | ICD-10-CM | POA: Diagnosis not present

## 2023-02-10 DIAGNOSIS — M25551 Pain in right hip: Secondary | ICD-10-CM | POA: Diagnosis not present

## 2023-02-10 DIAGNOSIS — Z880 Allergy status to penicillin: Secondary | ICD-10-CM

## 2023-02-10 DIAGNOSIS — Z90710 Acquired absence of both cervix and uterus: Secondary | ICD-10-CM

## 2023-02-10 DIAGNOSIS — E041 Nontoxic single thyroid nodule: Secondary | ICD-10-CM | POA: Diagnosis not present

## 2023-02-10 DIAGNOSIS — S7001XA Contusion of right hip, initial encounter: Secondary | ICD-10-CM | POA: Diagnosis present

## 2023-02-10 DIAGNOSIS — I5031 Acute diastolic (congestive) heart failure: Secondary | ICD-10-CM | POA: Diagnosis not present

## 2023-02-10 DIAGNOSIS — I771 Stricture of artery: Secondary | ICD-10-CM | POA: Diagnosis not present

## 2023-02-10 DIAGNOSIS — I10 Essential (primary) hypertension: Secondary | ICD-10-CM | POA: Diagnosis not present

## 2023-02-10 DIAGNOSIS — S0512XA Contusion of eyeball and orbital tissues, left eye, initial encounter: Secondary | ICD-10-CM | POA: Diagnosis not present

## 2023-02-10 LAB — CBC WITH DIFFERENTIAL/PLATELET
Abs Immature Granulocytes: 0.07 10*3/uL (ref 0.00–0.07)
Basophils Absolute: 0.1 10*3/uL (ref 0.0–0.1)
Basophils Relative: 0 %
Eosinophils Absolute: 0.4 10*3/uL (ref 0.0–0.5)
Eosinophils Relative: 3 %
HCT: 32.4 % — ABNORMAL LOW (ref 36.0–46.0)
Hemoglobin: 10.6 g/dL — ABNORMAL LOW (ref 12.0–15.0)
Immature Granulocytes: 1 %
Lymphocytes Relative: 4 %
Lymphs Abs: 0.6 10*3/uL — ABNORMAL LOW (ref 0.7–4.0)
MCH: 32.5 pg (ref 26.0–34.0)
MCHC: 32.7 g/dL (ref 30.0–36.0)
MCV: 99.4 fL (ref 80.0–100.0)
Monocytes Absolute: 1.2 10*3/uL — ABNORMAL HIGH (ref 0.1–1.0)
Monocytes Relative: 8 %
Neutro Abs: 12.7 10*3/uL — ABNORMAL HIGH (ref 1.7–7.7)
Neutrophils Relative %: 84 %
Platelets: 258 10*3/uL (ref 150–400)
RBC: 3.26 MIL/uL — ABNORMAL LOW (ref 3.87–5.11)
RDW: 12.4 % (ref 11.5–15.5)
WBC: 15 10*3/uL — ABNORMAL HIGH (ref 4.0–10.5)
nRBC: 0 % (ref 0.0–0.2)

## 2023-02-10 LAB — COMPREHENSIVE METABOLIC PANEL
ALT: 24 U/L (ref 0–44)
AST: 26 U/L (ref 15–41)
Albumin: 3.3 g/dL — ABNORMAL LOW (ref 3.5–5.0)
Alkaline Phosphatase: 64 U/L (ref 38–126)
Anion gap: 14 (ref 5–15)
BUN: 16 mg/dL (ref 8–23)
CO2: 25 mmol/L (ref 22–32)
Calcium: 9.3 mg/dL (ref 8.9–10.3)
Chloride: 99 mmol/L (ref 98–111)
Creatinine, Ser: 0.98 mg/dL (ref 0.44–1.00)
GFR, Estimated: 56 mL/min — ABNORMAL LOW (ref >60)
Glucose, Bld: 115 mg/dL — ABNORMAL HIGH (ref 70–99)
Potassium: 3.9 mmol/L (ref 3.5–5.1)
Sodium: 138 mmol/L (ref 135–145)
Total Bilirubin: 2.2 mg/dL — ABNORMAL HIGH (ref 0.3–1.2)
Total Protein: 6.7 g/dL (ref 6.5–8.1)

## 2023-02-10 MED ORDER — IOHEXOL 350 MG/ML SOLN
75.0000 mL | Freq: Once | INTRAVENOUS | Status: AC | PRN
  Administered 2023-02-10: 75 mL via INTRAVENOUS

## 2023-02-10 MED ORDER — ATORVASTATIN CALCIUM 40 MG PO TABS
40.0000 mg | ORAL_TABLET | Freq: Every day | ORAL | Status: DC
  Administered 2023-02-10 – 2023-02-14 (×5): 40 mg via ORAL
  Filled 2023-02-10 (×5): qty 1

## 2023-02-10 MED ORDER — ENOXAPARIN SODIUM 40 MG/0.4ML IJ SOSY
40.0000 mg | PREFILLED_SYRINGE | INTRAMUSCULAR | Status: DC
  Administered 2023-02-10: 40 mg via SUBCUTANEOUS
  Filled 2023-02-10: qty 0.4

## 2023-02-10 MED ORDER — FUROSEMIDE 20 MG PO TABS
20.0000 mg | ORAL_TABLET | Freq: Every day | ORAL | Status: DC
  Administered 2023-02-10 – 2023-02-14 (×5): 20 mg via ORAL
  Filled 2023-02-10 (×5): qty 1

## 2023-02-10 MED ORDER — POTASSIUM CHLORIDE CRYS ER 20 MEQ PO TBCR
20.0000 meq | EXTENDED_RELEASE_TABLET | Freq: Every day | ORAL | Status: DC
  Administered 2023-02-10 – 2023-02-13 (×4): 20 meq via ORAL
  Filled 2023-02-10 (×4): qty 1

## 2023-02-10 MED ORDER — LORAZEPAM 2 MG/ML IJ SOLN
0.5000 mg | Freq: Once | INTRAMUSCULAR | Status: AC
  Administered 2023-02-10: 0.5 mg via INTRAVENOUS
  Filled 2023-02-10: qty 1

## 2023-02-10 MED ORDER — ONDANSETRON HCL 4 MG/2ML IJ SOLN: 4.0000 mg | Freq: Four times a day (QID) | INTRAMUSCULAR | Status: DC | PRN

## 2023-02-10 MED ORDER — PANTOPRAZOLE SODIUM 40 MG PO TBEC
40.0000 mg | DELAYED_RELEASE_TABLET | Freq: Every day | ORAL | Status: DC
  Administered 2023-02-10 – 2023-02-14 (×5): 40 mg via ORAL
  Filled 2023-02-10 (×5): qty 1

## 2023-02-10 MED ORDER — ACETAMINOPHEN 650 MG RE SUPP: 650.0000 mg | Freq: Four times a day (QID) | RECTAL | Status: DC | PRN

## 2023-02-10 MED ORDER — POLYETHYLENE GLYCOL 3350 17 G PO PACK: 17.0000 g | PACK | Freq: Every day | ORAL | Status: DC | PRN

## 2023-02-10 MED ORDER — OLANZAPINE 10 MG IM SOLR
2.5000 mg | Freq: Four times a day (QID) | INTRAMUSCULAR | Status: DC | PRN
  Administered 2023-02-11 (×3): 2.5 mg via INTRAMUSCULAR
  Filled 2023-02-10 (×4): qty 10

## 2023-02-10 MED ORDER — AMIODARONE HCL 200 MG PO TABS
200.0000 mg | ORAL_TABLET | Freq: Every day | ORAL | Status: DC
  Administered 2023-02-10 – 2023-02-14 (×5): 200 mg via ORAL
  Filled 2023-02-10 (×5): qty 1

## 2023-02-10 MED ORDER — ACETAMINOPHEN 325 MG PO TABS
650.0000 mg | ORAL_TABLET | Freq: Four times a day (QID) | ORAL | Status: DC | PRN
  Administered 2023-02-10 – 2023-02-13 (×5): 650 mg via ORAL
  Filled 2023-02-10 (×5): qty 2

## 2023-02-10 MED ORDER — HYDROMORPHONE HCL 1 MG/ML IJ SOLN: 0.5000 mg | INTRAMUSCULAR | Status: DC | PRN

## 2023-02-10 MED ORDER — MONTELUKAST SODIUM 10 MG PO TABS
10.0000 mg | ORAL_TABLET | Freq: Every day | ORAL | Status: DC
  Administered 2023-02-10 – 2023-02-13 (×4): 10 mg via ORAL
  Filled 2023-02-10 (×4): qty 1

## 2023-02-10 MED ORDER — ONDANSETRON HCL 4 MG PO TABS: 4.0000 mg | ORAL_TABLET | Freq: Four times a day (QID) | ORAL | Status: DC | PRN

## 2023-02-10 NOTE — ED Notes (Signed)
Patient transported to CT 

## 2023-02-10 NOTE — ED Provider Notes (Signed)
[place] EMERGENCY DEPARTMENT AT [facility] Provider Note   CSN: [id number] Arrival date & time: 02/10/23  4098     History  Chief Complaint  Patient presents with   Marletta Lor    Jill Snow is a 89 y.o. female.   Fall  Patient presents after fall.  Came in as a level 2 trauma.  Is on Eliquis.  Reportedly fell sometime last night and hit her head.  Hematoma above left eye.  Although does have history of dementia.  Has had swelling of her right lower extremity.  History comes from daughter.  Has seen Ortho with the ported negative x-rays.  Has ecchymosis right superior hip area and a moderate ecchymosis down the back of her right thigh.  Good range of motion in the hip, although reportedly does not feel pain very much due to the dementia.  Does have moderate edema on the right side.  Patient is really without complaints and is at reported baseline.     Home Medications Prior to Admission medications   Medication Sig Start Date End Date Taking? Authorizing Provider  amiodarone (PACERONE) 200 MG tablet Take 1 tablet (200 mg total) by mouth daily. 05/21/22   Antonieta Iba, MD  atorvastatin (LIPITOR) 40 MG tablet Take 1 tablet (40 mg total) by mouth daily. 11/10/22   Glori Luis, MD  Cholecalciferol (VITAMIN D-3 PO) Take 1 capsule by mouth at bedtime.    [provider]  ELIQUIS 5 MG TABS tablet TAKE 1 TABLET BY MOUTH TWICE A DAY Patient taking differently: Take 5 mg by mouth in the morning and at bedtime. 07/17/22   Antonieta Iba, MD  furosemide (LASIX) 20 MG tablet TAKE 1 TABLET DAILY. MAY TAKE 1 EXTRA TABLET DAILY AS NEEDED FOR WEIGHT GAIN OF 2 POUNDS OVERNIGHT OR 5 POUNDS IN 1 WEEK Patient taking differently: Take 20 mg by mouth daily. 05/21/22   Antonieta Iba, MD  montelukast (SINGULAIR) 10 MG tablet Take 1 tablet (10 mg total) by mouth daily. Patient taking differently: Take 10 mg by mouth at bedtime. 11/10/22   Glori Luis, MD   pantoprazole (PROTONIX) 40 MG tablet Take 1 tablet (40 mg total) by mouth daily. 12/28/22   Barnetta Chapel, MD  polyethylene glycol (MIRALAX / GLYCOLAX) 17 g packet Take 17 g by mouth daily as needed for mild constipation. 12/27/22   Berton Mount I, MD  potassium chloride SA (KLOR-CON M20) 20 MEQ tablet TAKE 1 TABLET BY MOUTH EVERY DAY Patient taking differently: Take 20 mEq by mouth at bedtime. 07/06/22   Antonieta Iba, MD  VITAMIN E PO Take 1 capsule by mouth daily.    [provider]      Allergies    Augmentin [amoxicillin-pot clavulanate], Codeine, Crestor [rosuvastatin], and Penicillins    Review of Systems   Review of Systems  Physical Exam Updated Vital Signs BP 107/67   Pulse 75   Temp 98.2 F (36.8 C) (Oral)   Resp 16   Ht 5\' 8"  (1.727 m)   Wt 68 kg   SpO2 97%   BMI 22.81 kg/m  Physical Exam Vitals reviewed.  HENT:     Head:     Comments: Left forehead/periorbital ecchymosis.  No cheek tenderness.  No midline cervical spine tenderness. Chest:     Chest wall: No tenderness.  Abdominal:     Tenderness: There is no abdominal tenderness.  Musculoskeletal:        General:  No tenderness.     Cervical back: Neck supple.     Comments: Moderate edema right lower extremity.  Does go up to hip.  Moderate ecchymosis posterior thigh that does go both laterally and medially.  Sensation intact distally.  Smaller ecchymosis right superior iliac crest area.  Neurological:     Mental Status: She is alert. Mental status is at baseline.     ED Results / Procedures / Treatments   Labs (all labs ordered are listed, but only abnormal results are displayed) Labs Reviewed  CBC WITH DIFFERENTIAL/PLATELET - Abnormal; Notable for the following components:      Result Value   WBC 15.0 (*)    RBC 3.26 (*)    Hemoglobin 10.6 (*)    HCT 32.4 (*)    Neutro Abs 12.7 (*)    Lymphs Abs 0.6 (*)    Monocytes Absolute 1.2 (*)    All other components within normal  limits  COMPREHENSIVE METABOLIC PANEL - Abnormal; Notable for the following components:   Glucose, Bld 115 (*)    Albumin 3.3 (*)    Total Bilirubin 2.2 (*)    GFR, Estimated 56 (*)    All other components within normal limits  URINALYSIS, W/ REFLEX TO CULTURE (INFECTION SUSPECTED)    EKG None  Radiology VAS [name] LOWER EXTREMITY VENOUS (DVT) (ONLY [facility] & [facility])  Result Date: 02/10/2023  Lower Venous DVT Study Patient Name:  Jill Snow  Date of Exam:   02/10/2023 Medical Rec #: [id number]            Accession #:    [id number] Date of Birth: March 11, 1934             Patient Gender: F Patient Age:   39 years Exam Location:  [facility] Procedure:      VAS [name] LOWER EXTREMITY VENOUS (DVT) Referring Phys: Benjiman Core --------------------------------------------------------------------------------  Indications: Leg pain, s/p fall.  Anticoagulation: Eliquis. Comparison Study: 05-16-2018 Prior left lower extremity was negative for DVT. Performing Technologist: Jean Rosenthal RDMS, RVT  Examination Guidelines: A complete evaluation includes B-mode imaging, spectral Doppler, color Doppler, and power Doppler as needed of all accessible portions of each vessel. Bilateral testing is considered an integral part of a complete examination. Limited examinations for reoccurring indications may be performed as noted. The reflux portion of the exam is performed with the patient in reverse Trendelenburg.  +---------+---------------+---------+-----------+----------+--------------+ RIGHT    CompressibilityPhasicitySpontaneityPropertiesThrombus Aging +---------+---------------+---------+-----------+----------+--------------+ CFV      Full           Yes      Yes                                 +---------+---------------+---------+-----------+----------+--------------+ SFJ      Full                                                         +---------+---------------+---------+-----------+----------+--------------+ FV Prox  Full                                                        +---------+---------------+---------+-----------+----------+--------------+  FV Mid   Full                                                        +---------+---------------+---------+-----------+----------+--------------+ FV DistalFull                                                        +---------+---------------+---------+-----------+----------+--------------+ PFV      Full                                                        +---------+---------------+---------+-----------+----------+--------------+ POP      Full           Yes      Yes                                 +---------+---------------+---------+-----------+----------+--------------+ PTV      Full                                                        +---------+---------------+---------+-----------+----------+--------------+ PERO     Full                                                        +---------+---------------+---------+-----------+----------+--------------+   +----+---------------+---------+-----------+----------+--------------+ LEFTCompressibilityPhasicitySpontaneityPropertiesThrombus Aging +----+---------------+---------+-----------+----------+--------------+ CFV Full           Yes      Yes                                 +----+---------------+---------+-----------+----------+--------------+     Summary: RIGHT: - There is no evidence of deep vein thrombosis in the lower extremity.  - No cystic structure found in the popliteal fossa.  LEFT: - No evidence of common femoral vein obstruction.  *See table(s) above for measurements and observations.    Preliminary    CT FEMUR RIGHT W CONTRAST  Result Date: 02/10/2023 CLINICAL DATA:  Right hip pain and bruising. EXAM: CT OF THE LOWER RIGHT EXTREMITY WITH CONTRAST TECHNIQUE: Multidetector  CT imaging of the lower right extremity was performed according to the standard protocol following intravenous contrast administration. RADIATION DOSE REDUCTION: This exam was performed according to the departmental dose-optimization program which includes automated exposure control, adjustment of the mA and/or kV according to patient size and/or use of iterative reconstruction technique. CONTRAST:  75mL OMNIPAQUE IOHEXOL 350 MG/ML SOLN COMPARISON:  Pelvic x-rays from same day. FINDINGS: Bones/Joint/Cartilage No fracture or dislocation. Mild degenerative changes of the right hip and knee. No significant joint effusion. Ligaments Ligaments are suboptimally evaluated by CT. Muscles and  Tendons Asymmetric isodense enlargement of the distal right iliopsoas muscle (series 4, image 50) as well as the right pectineus muscle (series 4, image 69). No contrast extravasation. Right gluteus minimus and medius muscle atrophy. Soft tissue Soft tissue swelling of the lateral and posterior distal thigh and knee. No fluid collection or hematoma. No soft tissue mass. Small fat containing right hernia. IMPRESSION: 1. Asymmetric isodense enlargement of the distal right iliopsoas muscle and right pectineus muscle, concerning for intramuscular hematomas. No evidence of active bleeding. 2. Soft tissue swelling of the lateral and posterior distal thigh and knee. No fluid collection or hematoma. 3. No acute osseous abnormality. Electronically Signed   By: Obie Dredge M.D.   On: 02/10/2023 12:28   CT HEAD WO CONTRAST ( )  Result Date: 02/10/2023 CLINICAL DATA:  Head trauma, moderate-severe; Neck trauma (Age >= 65y) EXAM: CT HEAD WITHOUT CONTRAST CT CERVICAL SPINE WITHOUT CONTRAST TECHNIQUE: Multidetector CT imaging of the head and cervical spine was performed following the standard protocol without intravenous contrast. Multiplanar CT image reconstructions of the cervical spine were also generated. RADIATION DOSE REDUCTION: This  exam was performed according to the departmental dose-optimization program which includes automated exposure control, adjustment of the mA and/or kV according to patient size and/or use of iterative reconstruction technique. COMPARISON:  Cervical spine radiograph 05/19/2017 FINDINGS: CT HEAD FINDINGS Limitations: Motion degraded exam. Brain: No evidence of hemorrhage, hydrocephalus, extra-axial collection or mass lesion/mass effect. Sequela of moderate chronic microvascular ischemic change. Generalized volume loss. There is a subtle region of loss gray-white differentiation right occipital lobe, which may be artifactual or represent a site of age indeterminate infarct. Vascular: No hyperdense vessel or unexpected calcification. Skull: Soft tissue stranding in the periorbital soft tissues on the left with a small hematoma. No evidence of an underlying calvarial fracture. Sinuses/Orbits: Middle ear or mastoid effusion. Near-complete opacification of the right maxillary sinus with osseous changes suggestive of chronic right maxillary sinusitis. Bilateral lens replacement. Orbits are otherwise unremarkable. Other: None. CT CERVICAL SPINE FINDINGS Alignment: Trace retrolisthesis of C5 on C6, slightly progressed compared to 2018. Skull base and vertebrae: No acute fracture. No primary bone lesion or focal pathologic process. Soft tissues and spinal canal: No prevertebral fluid or swelling. No visible canal hematoma. Disc levels:  No evidence of high-grade spinal canal stenosis. Upper chest: Negative. Other: 0.5 cm hypodense left thyroid nodule, unchanged compared to 2021. IMPRESSION: 1. Soft tissue injury to the periorbital soft tissues on the left. No evidence of underlying calvarial fracture or intracranial injury. 2. Subtle region of loss gray-white differentiation in the right occipital lobe may be artifactual in the setting of significant motion artifact or represent a site of and age indeterminate infarct. If there  is clinical concern for acute infarct, consider further evaluation with MRI. 3. No acute cervical spine fracture. Electronically Signed   By: Lorenza Cambridge M.D.   On: 02/10/2023 10:14   CT Cervical Spine Wo Contrast  Result Date: 02/10/2023 CLINICAL DATA:  Head trauma, moderate-severe; Neck trauma (Age >= 65y) EXAM: CT HEAD WITHOUT CONTRAST CT CERVICAL SPINE WITHOUT CONTRAST TECHNIQUE: Multidetector CT imaging of the head and cervical spine was performed following the standard protocol without intravenous contrast. Multiplanar CT image reconstructions of the cervical spine were also generated. RADIATION DOSE REDUCTION: This exam was performed according to the departmental dose-optimization program which includes automated exposure control, adjustment of the mA and/or kV according to patient size and/or use of iterative reconstruction technique. COMPARISON:  Cervical spine radiograph 05/19/2017  FINDINGS: CT HEAD FINDINGS Limitations: Motion degraded exam. Brain: No evidence of hemorrhage, hydrocephalus, extra-axial collection or mass lesion/mass effect. Sequela of moderate chronic microvascular ischemic change. Generalized volume loss. There is a subtle region of loss gray-white differentiation right occipital lobe, which may be artifactual or represent a site of age indeterminate infarct. Vascular: No hyperdense vessel or unexpected calcification. Skull: Soft tissue stranding in the periorbital soft tissues on the left with a small hematoma. No evidence of an underlying calvarial fracture. Sinuses/Orbits: Middle ear or mastoid effusion. Near-complete opacification of the right maxillary sinus with osseous changes suggestive of chronic right maxillary sinusitis. Bilateral lens replacement. Orbits are otherwise unremarkable. Other: None. CT CERVICAL SPINE FINDINGS Alignment: Trace retrolisthesis of C5 on C6, slightly progressed compared to 2018. Skull base and vertebrae: No acute fracture. No primary bone lesion  or focal pathologic process. Soft tissues and spinal canal: No prevertebral fluid or swelling. No visible canal hematoma. Disc levels:  No evidence of high-grade spinal canal stenosis. Upper chest: Negative. Other: 0.5 cm hypodense left thyroid nodule, unchanged compared to 2021. IMPRESSION: 1. Soft tissue injury to the periorbital soft tissues on the left. No evidence of underlying calvarial fracture or intracranial injury. 2. Subtle region of loss gray-white differentiation in the right occipital lobe may be artifactual in the setting of significant motion artifact or represent a site of and age indeterminate infarct. If there is clinical concern for acute infarct, consider further evaluation with MRI. 3. No acute cervical spine fracture. Electronically Signed   By: Lorenza Cambridge M.D.   On: 02/10/2023 10:14   DG Pelvis Portable  Result Date: 02/10/2023 CLINICAL DATA:  Fall EXAM: PORTABLE PELVIS 1 VIEWS COMPARISON:  None Available. FINDINGS: No evidence of pelvic fracture or diastasis. Mild degenerative changes of the bilateral hips. Degenerative changes of the pubic symphysis and partially visualized lumbar spine. Diffuse demineralization. No pelvic bone lesions are seen. IMPRESSION: No evidence of displaced fracture. Diffuse demineralization limits evaluation for nondisplaced fracture, if there is continued clinical concern recommend further evaluation with cross-sectional imaging. Electronically Signed   By: Allegra Lai M.D.   On: 02/10/2023 09:23   DG Chest Portable 1 View  Result Date: 02/10/2023 CLINICAL DATA:  Status post fall. EXAM: PORTABLE CHEST 1 VIEW COMPARISON:  Dec 25, 2022 FINDINGS: Calcific atherosclerotic disease and tortuosity of the aorta. Cardiomediastinal silhouette is normal. Mediastinal contours appear intact. There is no evidence of focal airspace consolidation, pleural effusion or pneumothorax. Chronic elevation of the left hemidiaphragm with large hiatal hernia. Osseous  structures are without acute abnormality. Soft tissues are grossly normal. IMPRESSION: No active disease. Electronically Signed   By: Ted Mcalpine M.D.   On: 02/10/2023 09:19    Procedures Procedures    Medications Ordered in ED Medications  LORazepam (ATIVAN) injection 0.5 mg (0.5 mg Intravenous Given 02/10/23 1148)  iohexol (OMNIPAQUE) 350 MG/ML injection 75 mL (75 mLs Intravenous Contrast Given 02/10/23 1210)    ED Course/ Medical Decision Making/ A&P                             Medical Decision Making Amount and/or Complexity of Data Reviewed Labs: ordered. Radiology: ordered.  Risk Prescription drug management. Decision regarding hospitalization.   Patient with fall.  Level 2 trauma due to head injury on Eliquis.  Differential diagnosis does include intracranial hemorrhage, fractures.  Moderate ecchymosis behind right thigh.  I think this is more likely chronic due to the  size of it.  Has had x-ray.  Will get pelvis x-ray and chest x-ray.  Will get head CT and cervical spine CT.  Will likely get ultrasound to potentially CT to evaluate for hematoma or DVT.  Blood work overall reassuring.  Although hemoglobin has decreased.  With the bruising of the thigh CT scan was done and does show possible intramuscular hematoma.  With anticoagulation and decreasing hemoglobin along decreasing mental status and energy level I think she would benefit from admission to the hospital for further monitoring.  Will also check urinalysis due to her dementia.  Will discuss with hospitalist.  Do not think she needs emergent transfusion.        Final Clinical Impression(s) / ED Diagnoses Final diagnoses:  Psoas hematoma, right, secondary to anticoagulant therapy, initial encounter  Encephalopathy    Rx / DC Orders ED Discharge Orders     None         Benjiman Core, MD 02/10/23 1356

## 2023-02-10 NOTE — H&P (Signed)
History and Physical    Jill Snow BJY:[id number] DOB: October 10, 1933 DOA: 02/10/2023  PCP: Glori Luis, MD (Confirm with patient/family/NH records and if not entered, this has to be entered at [facility] point of entry) Patient coming from: Home  I have personally briefly reviewed patient's old medical records in [facility]  Chief Complaint: Right hip pain  HPI: Jill Snow is a 89 y.o. female with medical history significant of Alzheimer's dementia, chronic HFpEF, HTN, PAF on Eliquis, HLD, brought in by family member for acute right hip pain.  Patient has history of unsteady gait but no previous episode of fall.  She lives with her caregiver and with family frequent visiting.  Family was out of town last week for vacation.  After vacation, family came back on Monday and found patient limping on right leg.  Yesterday family brought patient to orthopedic surgery did x-ray showed no fracture or dislocation and patient was reassured and sent home.  According to family, patient has severe dementia and does not always report painful event.  Currently patient unable to provide any history given her baseline dementia.  ED Course: Afebrile none tachycardia none hypotension.  Image study head and neck CT showed soft tissue injury to the left periorbital area but no fracture, subtle regions close gray-white differentiation in the right occipital lobe probably artifac versus stroke.  CT head prior showed intramuscular hematoma of the right iliopsoas. DVT study negative.  Blood work showed WBC 15, hemoglobin 10.6 compared to baseline 13, creatinine 0.9 K3.5.  Review of Systems: Unable to perform, patient has baseline advanced dementia Past Medical History:  Diagnosis Date   Atrial flutter (HCC)    a. s/p successful TEE/DCCV in 2014; b. TEE 2014 showed EF 45-50%, mild bi-atrial enlargement, mod MR   Dyspnea    History of chicken pox    Hypercholesterolemia    Hypertension    Mitral  regurgitation    a. echo 2014: EF 40-45%, mildly dilated RV, mod reduced RV systolic fxn, mod dilated LA, Mild to mod MR, mod TR, mildly elevated PASP   PAF (paroxysmal atrial fibrillation) (HCC)    a. initial episode 2011; b. not on long term anticoagulation since 06/2013   RBBB (right bundle branch block)    Seasonal allergies    Sleep apnea    a. on CPAP   Thyroid nodule 05/19/2017    Past Surgical History:  Procedure Laterality Date   ABDOMINAL HYSTERECTOMY     TONSILLECTOMY AND ADENOIDECTOMY  1947   tummy tuck       reports that she has never smoked. She has never used smokeless tobacco. She reports that she does not drink alcohol and does not use drugs.  Allergies  Allergen Reactions   Augmentin [Amoxicillin-Pot Clavulanate] Nausea And Vomiting   Codeine Anaphylaxis   Crestor [Rosuvastatin] Other (See Comments)    Myalgias     Penicillins Other (See Comments)    Unknown reaction Tolerates cephalosporins     Family History  Problem Relation Age of Onset   Stroke Mother    Heart attack Mother    Arthritis Mother    Hyperlipidemia Mother    Heart disease Mother    Diabetes Mother    Diabetes Sister    Cancer Daughter    Heart disease Daughter    Diabetes Daughter    Diabetes Son    Diabetes Maternal Grandmother    Diabetes Maternal Grandfather      Prior to Admission medications  Medication Sig Start Date End Date Taking? Authorizing Provider  amiodarone (PACERONE) 200 MG tablet Take 1 tablet (200 mg total) by mouth daily. 05/21/22   Antonieta Iba, MD  atorvastatin (LIPITOR) 40 MG tablet Take 1 tablet (40 mg total) by mouth daily. 11/10/22   Glori Luis, MD  Cholecalciferol (VITAMIN D-3 PO) Take 1 capsule by mouth at bedtime.    [provider]  ELIQUIS 5 MG TABS tablet TAKE 1 TABLET BY MOUTH TWICE A DAY Patient taking differently: Take 5 mg by mouth in the morning and at bedtime. 07/17/22   Antonieta Iba, MD  furosemide (LASIX) 20 MG  tablet TAKE 1 TABLET DAILY. MAY TAKE 1 EXTRA TABLET DAILY AS NEEDED FOR WEIGHT GAIN OF 2 POUNDS OVERNIGHT OR 5 POUNDS IN 1 WEEK Patient taking differently: Take 20 mg by mouth daily. 05/21/22   Antonieta Iba, MD  montelukast (SINGULAIR) 10 MG tablet Take 1 tablet (10 mg total) by mouth daily. Patient taking differently: Take 10 mg by mouth at bedtime. 11/10/22   Glori Luis, MD  pantoprazole (PROTONIX) 40 MG tablet Take 1 tablet (40 mg total) by mouth daily. 12/28/22   Barnetta Chapel, MD  polyethylene glycol (MIRALAX / GLYCOLAX) 17 g packet Take 17 g by mouth daily as needed for mild constipation. 12/27/22   Berton Mount I, MD  potassium chloride SA (KLOR-CON M20) 20 MEQ tablet TAKE 1 TABLET BY MOUTH EVERY DAY Patient taking differently: Take 20 mEq by mouth at bedtime. 07/06/22   Antonieta Iba, MD  VITAMIN E PO Take 1 capsule by mouth daily.    [provider]    Physical Exam: Vitals:   02/10/23 0858 02/10/23 0900 02/10/23 0903 02/10/23 1230  BP: (!) 132/54 (!) 139/59  107/67  Pulse:  93  75  Resp:  20  16  Temp:  98.2 F (36.8 C)    TempSrc:  Oral    SpO2:  100%  97%  Weight:   68 kg   Height:   5\' 8"  (1.727 m)     Constitutional: NAD, calm, comfortable Vitals:   02/10/23 0858 02/10/23 0900 02/10/23 0903 02/10/23 1230  BP: (!) 132/54 (!) 139/59  107/67  Pulse:  93  75  Resp:  20  16  Temp:  98.2 F (36.8 C)    TempSrc:  Oral    SpO2:  100%  97%  Weight:   68 kg   Height:   5\' 8"  (1.727 m)    Eyes: PERRL, lids and conjunctivae normal ENMT: Mucous membranes are moist. Posterior pharynx clear of any exudate or lesions.Normal dentition.  Neck: normal, supple, no masses, no thyromegaly Respiratory: clear to auscultation bilaterally, no wheezing, no crackles. Normal respiratory effort. No accessory muscle use.  Cardiovascular: Regular rate and rhythm, no murmurs / rubs / gallops. No extremity edema. 2+ pedal pulses. No carotid bruits.  Abdomen: no  tenderness, no masses palpated. No hepatosplenomegaly. Bowel sounds positive.  Musculoskeletal: Large hematoma right hip area, with severe tenderness and decreased ROM Skin: no rashes, lesions, ulcers. No induration Neurologic: CN 2-12 grossly intact. Sensation intact, DTR normal. Strength 5/5 in all 4.  Psychiatric: Awake, oriented to herself, confused about time and place    Labs on Admission: I have personally reviewed following labs and imaging studies  CBC: Recent Labs  Lab 02/10/23 0927  WBC 15.0*  NEUTROABS 12.7*  HGB 10.6*  HCT 32.4*  MCV 99.4  PLT 258  Basic Metabolic Panel: Recent Labs  Lab 02/10/23 0927  NA 138  K 3.9  CL 99  CO2 25  GLUCOSE 115*  BUN 16  CREATININE 0.98  CALCIUM 9.3   GFR: Estimated Creatinine Clearance: 40.8 mL/min (by C-G formula based on SCr of 0.98 mg/dL). Liver Function Tests: Recent Labs  Lab 02/10/23 0927  AST 26  ALT 24  ALKPHOS 64  BILITOT 2.2*  PROT 6.7  ALBUMIN 3.3*   No results for input(s): "LIPASE", "AMYLASE" in the last 168 hours. No results for input(s): "AMMONIA" in the last 168 hours. Coagulation Profile: No results for input(s): "INR", "PROTIME" in the last 168 hours. Cardiac Enzymes: No results for input(s): "CKTOTAL", "CKMB", "CKMBINDEX", "TROPONINI" in the last 168 hours. BNP (last 3 results) Recent Labs    08/26/22 0828  PROBNP 59.0   HbA1C: No results for input(s): "HGBA1C" in the last 72 hours. CBG: No results for input(s): "GLUCAP" in the last 168 hours. Lipid Profile: No results for input(s): "CHOL", "HDL", "LDLCALC", "TRIG", "CHOLHDL", "LDLDIRECT" in the last 72 hours. Thyroid Function Tests: No results for input(s): "TSH", "T4TOTAL", "FREET4", "T3FREE", "THYROIDAB" in the last 72 hours. Anemia Panel: No results for input(s): "VITAMINB12", "FOLATE", "FERRITIN", "TIBC", "IRON", "RETICCTPCT" in the last 72 hours. Urine analysis:    Component Value Date/Time   COLORURINE AMBER (A) 12/25/2022  1537   APPEARANCEUR CLOUDY (A) 12/25/2022 1537   APPEARANCEUR Hazy 05/05/2012 0014   LABSPEC 1.018 12/25/2022 1537   LABSPEC 1.017 05/05/2012 0014   PHURINE 6.0 12/25/2022 1537   GLUCOSEU NEGATIVE 12/25/2022 1537   GLUCOSEU NEGATIVE 06/04/2021 1134   HGBUR MODERATE (A) 12/25/2022 1537   BILIRUBINUR NEGATIVE 12/25/2022 1537   BILIRUBINUR neg 06/09/2021 1621   BILIRUBINUR Negative 05/05/2012 0014   KETONESUR NEGATIVE 12/25/2022 1537   PROTEINUR NEGATIVE 12/25/2022 1537   UROBILINOGEN 0.2 06/09/2021 1621   UROBILINOGEN 0.2 06/04/2021 1134   NITRITE NEGATIVE 12/25/2022 1537   LEUKOCYTESUR MODERATE (A) 12/25/2022 1537   LEUKOCYTESUR 3+ 05/05/2012 0014    Radiological Exams on Admission: VAS [name] LOWER EXTREMITY VENOUS (DVT) (ONLY [facility] & [facility])  Result Date: 02/10/2023  Lower Venous DVT Study Patient Name:  Jill Snow  Date of Exam:   02/10/2023 Medical Rec #: [id number]            Accession #:    [id number] Date of Birth: Apr 27, 1934             Patient Gender: F Patient Age:   30 years Exam Location:  [facility] Procedure:      VAS [name] LOWER EXTREMITY VENOUS (DVT) Referring Phys: Benjiman Core --------------------------------------------------------------------------------  Indications: Leg pain, s/p fall.  Anticoagulation: Eliquis. Comparison Study: 05-16-2018 Prior left lower extremity was negative for DVT. Performing Technologist: Jean Rosenthal RDMS, RVT  Examination Guidelines: A complete evaluation includes B-mode imaging, spectral Doppler, color Doppler, and power Doppler as needed of all accessible portions of each vessel. Bilateral testing is considered an integral part of a complete examination. Limited examinations for reoccurring indications may be performed as noted. The reflux portion of the exam is performed with the patient in reverse Trendelenburg.  +---------+---------------+---------+-----------+----------+--------------+ RIGHT     CompressibilityPhasicitySpontaneityPropertiesThrombus Aging +---------+---------------+---------+-----------+----------+--------------+ CFV      Full           Yes      Yes                                 +---------+---------------+---------+-----------+----------+--------------+  SFJ      Full                                                        +---------+---------------+---------+-----------+----------+--------------+ FV Prox  Full                                                        +---------+---------------+---------+-----------+----------+--------------+ FV Mid   Full                                                        +---------+---------------+---------+-----------+----------+--------------+ FV DistalFull                                                        +---------+---------------+---------+-----------+----------+--------------+ PFV      Full                                                        +---------+---------------+---------+-----------+----------+--------------+ POP      Full           Yes      Yes                                 +---------+---------------+---------+-----------+----------+--------------+ PTV      Full                                                        +---------+---------------+---------+-----------+----------+--------------+ PERO     Full                                                        +---------+---------------+---------+-----------+----------+--------------+   +----+---------------+---------+-----------+----------+--------------+ LEFTCompressibilityPhasicitySpontaneityPropertiesThrombus Aging +----+---------------+---------+-----------+----------+--------------+ CFV Full           Yes      Yes                                 +----+---------------+---------+-----------+----------+--------------+     Summary: RIGHT: - There is no evidence of deep vein thrombosis in the lower  extremity.  - No cystic structure found in the popliteal fossa.  LEFT: - No evidence of common femoral vein obstruction.  *See table(s) above for measurements and observations.    Preliminary  CT FEMUR RIGHT W CONTRAST  Result Date: 02/10/2023 CLINICAL DATA:  Right hip pain and bruising. EXAM: CT OF THE LOWER RIGHT EXTREMITY WITH CONTRAST TECHNIQUE: Multidetector CT imaging of the lower right extremity was performed according to the standard protocol following intravenous contrast administration. RADIATION DOSE REDUCTION: This exam was performed according to the departmental dose-optimization program which includes automated exposure control, adjustment of the mA and/or kV according to patient size and/or use of iterative reconstruction technique. CONTRAST:  75mL OMNIPAQUE IOHEXOL 350 MG/ML SOLN COMPARISON:  Pelvic x-rays from same day. FINDINGS: Bones/Joint/Cartilage No fracture or dislocation. Mild degenerative changes of the right hip and knee. No significant joint effusion. Ligaments Ligaments are suboptimally evaluated by CT. Muscles and Tendons Asymmetric isodense enlargement of the distal right iliopsoas muscle (series 4, image 50) as well as the right pectineus muscle (series 4, image 69). No contrast extravasation. Right gluteus minimus and medius muscle atrophy. Soft tissue Soft tissue swelling of the lateral and posterior distal thigh and knee. No fluid collection or hematoma. No soft tissue mass. Small fat containing right hernia. IMPRESSION: 1. Asymmetric isodense enlargement of the distal right iliopsoas muscle and right pectineus muscle, concerning for intramuscular hematomas. No evidence of active bleeding. 2. Soft tissue swelling of the lateral and posterior distal thigh and knee. No fluid collection or hematoma. 3. No acute osseous abnormality. Electronically Signed   By: Obie Dredge M.D.   On: 02/10/2023 12:28   CT HEAD WO CONTRAST ( )  Result Date: 02/10/2023 CLINICAL DATA:  Head  trauma, moderate-severe; Neck trauma (Age >= 65y) EXAM: CT HEAD WITHOUT CONTRAST CT CERVICAL SPINE WITHOUT CONTRAST TECHNIQUE: Multidetector CT imaging of the head and cervical spine was performed following the standard protocol without intravenous contrast. Multiplanar CT image reconstructions of the cervical spine were also generated. RADIATION DOSE REDUCTION: This exam was performed according to the departmental dose-optimization program which includes automated exposure control, adjustment of the mA and/or kV according to patient size and/or use of iterative reconstruction technique. COMPARISON:  Cervical spine radiograph 05/19/2017 FINDINGS: CT HEAD FINDINGS Limitations: Motion degraded exam. Brain: No evidence of hemorrhage, hydrocephalus, extra-axial collection or mass lesion/mass effect. Sequela of moderate chronic microvascular ischemic change. Generalized volume loss. There is a subtle region of loss gray-white differentiation right occipital lobe, which may be artifactual or represent a site of age indeterminate infarct. Vascular: No hyperdense vessel or unexpected calcification. Skull: Soft tissue stranding in the periorbital soft tissues on the left with a small hematoma. No evidence of an underlying calvarial fracture. Sinuses/Orbits: Middle ear or mastoid effusion. Near-complete opacification of the right maxillary sinus with osseous changes suggestive of chronic right maxillary sinusitis. Bilateral lens replacement. Orbits are otherwise unremarkable. Other: None. CT CERVICAL SPINE FINDINGS Alignment: Trace retrolisthesis of C5 on C6, slightly progressed compared to 2018. Skull base and vertebrae: No acute fracture. No primary bone lesion or focal pathologic process. Soft tissues and spinal canal: No prevertebral fluid or swelling. No visible canal hematoma. Disc levels:  No evidence of high-grade spinal canal stenosis. Upper chest: Negative. Other: 0.5 cm hypodense left thyroid nodule, unchanged  compared to 2021. IMPRESSION: 1. Soft tissue injury to the periorbital soft tissues on the left. No evidence of underlying calvarial fracture or intracranial injury. 2. Subtle region of loss gray-white differentiation in the right occipital lobe may be artifactual in the setting of significant motion artifact or represent a site of and age indeterminate infarct. If there is clinical concern for acute infarct, consider further evaluation with  MRI. 3. No acute cervical spine fracture. Electronically Signed   By: Lorenza Cambridge M.D.   On: 02/10/2023 10:14   CT Cervical Spine Wo Contrast  Result Date: 02/10/2023 CLINICAL DATA:  Head trauma, moderate-severe; Neck trauma (Age >= 65y) EXAM: CT HEAD WITHOUT CONTRAST CT CERVICAL SPINE WITHOUT CONTRAST TECHNIQUE: Multidetector CT imaging of the head and cervical spine was performed following the standard protocol without intravenous contrast. Multiplanar CT image reconstructions of the cervical spine were also generated. RADIATION DOSE REDUCTION: This exam was performed according to the departmental dose-optimization program which includes automated exposure control, adjustment of the mA and/or kV according to patient size and/or use of iterative reconstruction technique. COMPARISON:  Cervical spine radiograph 05/19/2017 FINDINGS: CT HEAD FINDINGS Limitations: Motion degraded exam. Brain: No evidence of hemorrhage, hydrocephalus, extra-axial collection or mass lesion/mass effect. Sequela of moderate chronic microvascular ischemic change. Generalized volume loss. There is a subtle region of loss gray-white differentiation right occipital lobe, which may be artifactual or represent a site of age indeterminate infarct. Vascular: No hyperdense vessel or unexpected calcification. Skull: Soft tissue stranding in the periorbital soft tissues on the left with a small hematoma. No evidence of an underlying calvarial fracture. Sinuses/Orbits: Middle ear or mastoid effusion.  Near-complete opacification of the right maxillary sinus with osseous changes suggestive of chronic right maxillary sinusitis. Bilateral lens replacement. Orbits are otherwise unremarkable. Other: None. CT CERVICAL SPINE FINDINGS Alignment: Trace retrolisthesis of C5 on C6, slightly progressed compared to 2018. Skull base and vertebrae: No acute fracture. No primary bone lesion or focal pathologic process. Soft tissues and spinal canal: No prevertebral fluid or swelling. No visible canal hematoma. Disc levels:  No evidence of high-grade spinal canal stenosis. Upper chest: Negative. Other: 0.5 cm hypodense left thyroid nodule, unchanged compared to 2021. IMPRESSION: 1. Soft tissue injury to the periorbital soft tissues on the left. No evidence of underlying calvarial fracture or intracranial injury. 2. Subtle region of loss gray-white differentiation in the right occipital lobe may be artifactual in the setting of significant motion artifact or represent a site of and age indeterminate infarct. If there is clinical concern for acute infarct, consider further evaluation with MRI. 3. No acute cervical spine fracture. Electronically Signed   By: Lorenza Cambridge M.D.   On: 02/10/2023 10:14   DG Pelvis Portable  Result Date: 02/10/2023 CLINICAL DATA:  Fall EXAM: PORTABLE PELVIS 1 VIEWS COMPARISON:  None Available. FINDINGS: No evidence of pelvic fracture or diastasis. Mild degenerative changes of the bilateral hips. Degenerative changes of the pubic symphysis and partially visualized lumbar spine. Diffuse demineralization. No pelvic bone lesions are seen. IMPRESSION: No evidence of displaced fracture. Diffuse demineralization limits evaluation for nondisplaced fracture, if there is continued clinical concern recommend further evaluation with cross-sectional imaging. Electronically Signed   By: Allegra Lai M.D.   On: 02/10/2023 09:23   DG Chest Portable 1 View  Result Date: 02/10/2023 CLINICAL DATA:  Status post  fall. EXAM: PORTABLE CHEST 1 VIEW COMPARISON:  Dec 25, 2022 FINDINGS: Calcific atherosclerotic disease and tortuosity of the aorta. Cardiomediastinal silhouette is normal. Mediastinal contours appear intact. There is no evidence of focal airspace consolidation, pleural effusion or pneumothorax. Chronic elevation of the left hemidiaphragm with large hiatal hernia. Osseous structures are without acute abnormality. Soft tissues are grossly normal. IMPRESSION: No active disease. Electronically Signed   By: Ted Mcalpine M.D.   On: 02/10/2023 09:19    EKG: Independently reviewed.  Sinus rhythm, despite reported as A-fib by computer,  no acute PR or QTc interval changes.  Assessment/Plan Active Problems:   Atrial fibrillation (HCC)   AMS (altered mental status)   Psoas hematoma, right, secondary to anticoagulant therapy   Fall  (please populate well all problems here in Problem List. (For example, if patient is on BP meds at home and you resume or decide to hold them, it is a problem that needs to be her. Same for CAD, COPD, HLD and so on)  Acute right iliopsoas hematoma -Likely secondary to mechanical fall -No fracture or dislocation on right hip MRI.  Hold off systemic anticoagulation, start physical therapy tomorrow.  Acute blood loss anemia -Secondary to fall and right acute iliopsoas hematoma, management as above -Hold off systemic anticoagulation, repeat H&H tomorrow  Fall  Acute ambulation impairment -No fracture or dislocation on imaging studies.  PT evaluation tomorrow -Other Ddx, her episode of fall was unwitnessed and patient is baseline demented, unable to provide any history of whether she had prodromes of lightheadedness/blurry vision before or during the fall.  Will treat as presyncope/syncope. Check orthostatic vital signs, echocardiogram and telemonitoring x 24 hours. -Other Ddx, CT head showed abnormal signals of right occipital lobe. But given there is no evidence of focal  neurodeficit on physical exam as well as patient on long-term anticoagulation, stroke is unlikely. -UA sent  PAF -Sinus rhythm, continue amiodarone -Last dose of Eliquis was yesterday evening, hold off Eliquis for today given acute H&H drop, probably can resume Eliquis tomorrow once H&H stabilized.  History of Alzheimer's dementia with sundowning -She is sedated coming back from MRI -Reevaluate mentation later, add as needed Zyprexa for sundowning/agitation  DVT prophylaxis: Lovenox Code Status: Full code Family Communication: Daughter at bedside Disposition Plan: Expect less than 2 midnight hospital stay Consults called: None Admission status: Tele obs   Emeline General MD [facility] Pager [phone]  02/10/2023, 2:25 PM

## 2023-02-10 NOTE — Telephone Encounter (Signed)
Patient's daughter, Windle Guard, called to state patient needs to cancel her appointment on 02/12/2023 with Dr. Marikay Alar.  Angie states patient fell and is in the hospital [facility]).

## 2023-02-10 NOTE — Progress Notes (Signed)
Responded to page to support patient that fell and hit head. Chaplain will follow as needed.  Venida Jarvis, [place], [facility], Pager [phone]

## 2023-02-10 NOTE — Plan of Care (Signed)
  Problem: Education: Goal: Knowledge of General Education information will improve Description: Including pain rating scale, medication(s)/side effects and non-pharmacologic comfort measures Outcome: Not Progressing   Problem: Health Behavior/Discharge Planning: Goal: Ability to manage health-related needs will improve Outcome: Not Progressing   

## 2023-02-10 NOTE — ED Notes (Signed)
Patient transported to CT with primary RN. 

## 2023-02-10 NOTE — ED Notes (Signed)
ED TO INPATIENT HANDOFF REPORT  ED Nurse Name and Phone #: Salvadore Farber Name/Age/Gender Jill Snow 89 y.o. female Room/Bed: 007C/007C  Code Status   Code Status: Full Code  Home/SNF/Other Home Patient oriented to: self Is this baseline? Yes   Triage Complete: Triage complete  Chief Complaint Fall [W19.XXXA]  Triage Note Patient fell unknown time last night, right hip has been bothering her and that may be why she fell according to family. Patient is on eliquis, has obvious bruising to forehead.    Allergies Allergies  Allergen Reactions   Augmentin [Amoxicillin-Pot Clavulanate] Nausea And Vomiting   Codeine Anaphylaxis   Crestor [Rosuvastatin] Other (See Comments)    Myalgias     Penicillins Other (See Comments)    Unknown reaction Tolerates cephalosporins     Level of Care/Admitting Diagnosis ED Disposition     ED Disposition  Admit   Condition  --   Comment  Hospital Area: [facility] [[id number]]  Level of Care: Telemetry Medical [104]  May place patient in observation at [facility] or [facility] if equivalent level of care is available:: No  Covid Evaluation: Asymptomatic - no recent exposure (last 10 days) testing not required  Diagnosis: Fall [[id number]]  Admitting Physician: Emeline General [[id number]]  Attending Physician: Emeline General [[id number]]          B Medical/Surgery History Past Medical History:  Diagnosis Date   Atrial flutter (HCC)    a. s/p successful TEE/DCCV in 2014; b. TEE 2014 showed EF 45-50%, mild bi-atrial enlargement, mod MR   Dyspnea    History of chicken pox    Hypercholesterolemia    Hypertension    Mitral regurgitation    a. echo 2014: EF 40-45%, mildly dilated RV, mod reduced RV systolic fxn, mod dilated LA, Mild to mod MR, mod TR, mildly elevated PASP   PAF (paroxysmal atrial fibrillation) (HCC)    a. initial episode 2011; b. not on long term anticoagulation since 06/2013   RBBB (right bundle  branch block)    Seasonal allergies    Sleep apnea    a. on CPAP   Thyroid nodule 05/19/2017   Past Surgical History:  Procedure Laterality Date   ABDOMINAL HYSTERECTOMY     TONSILLECTOMY AND ADENOIDECTOMY  1947   tummy tuck       A IV Location/Drains/Wounds Patient Lines/Drains/Airways Status     Active Line/Drains/Airways     Name Placement date Placement time Site Days   Peripheral IV 02/10/23 22 G Anterior;Distal;Left Forearm 02/10/23  0852  Forearm  less than 1   Wound / Incision (Open or Dehisced) 12/16/20 Other (Comment) Arm Anterior;Lower;Bilateral Scabbed scratches 12/16/20  1830  Arm  786            Intake/Output Last 24 hours No intake or output data in the 24 hours ending 02/10/23 1446  Labs/Imaging Results for orders placed or performed during the hospital encounter of 02/10/23 (from the past 48 hour(s))  CBC with Differential     Status: Abnormal   Collection Time: 02/10/23  9:27 AM  Result Value Ref Range   WBC 15.0 (H) 4.0 - 10.5 K/uL   RBC 3.26 (L) 3.87 - 5.11 MIL/uL   Hemoglobin 10.6 (L) 12.0 - 15.0 g/dL   HCT 38.7 (L) 56.4 - 33.2 %   MCV 99.4 80.0 - 100.0 fL   MCH 32.5 26.0 - 34.0 pg   MCHC 32.7 30.0 - 36.0 g/dL  RDW 12.4 11.5 - 15.5 %   Platelets 258 150 - 400 K/uL   nRBC 0.0 0.0 - 0.2 %   Neutrophils Relative % 84 %   Neutro Abs 12.7 (H) 1.7 - 7.7 K/uL   Lymphocytes Relative 4 %   Lymphs Abs 0.6 (L) 0.7 - 4.0 K/uL   Monocytes Relative 8 %   Monocytes Absolute 1.2 (H) 0.1 - 1.0 K/uL   Eosinophils Relative 3 %   Eosinophils Absolute 0.4 0.0 - 0.5 K/uL   Basophils Relative 0 %   Basophils Absolute 0.1 0.0 - 0.1 K/uL   Immature Granulocytes 1 %   Abs Immature Granulocytes 0.07 0.00 - 0.07 K/uL    Comment: Performed at [facility], [address] [place], [place] [address]  Comprehensive metabolic panel     Status: Abnormal   Collection Time: 02/10/23  9:27 AM  Result Value Ref Range   Sodium 138 135 - 145 mmol/L   Potassium 3.9  3.5 - 5.1 mmol/L   Chloride 99 98 - 111 mmol/L   CO2 25 22 - 32 mmol/L   Glucose, Bld 115 (H) 70 - 99 mg/dL    Comment: Glucose reference range applies only to samples taken after fasting for at least 8 hours.   BUN 16 8 - 23 mg/dL   Creatinine, Ser 6.04 0.44 - 1.00 mg/dL   Calcium 9.3 8.9 - 54.0 mg/dL   Total Protein 6.7 6.5 - 8.1 g/dL   Albumin 3.3 (L) 3.5 - 5.0 g/dL   AST 26 15 - 41 U/L   ALT 24 0 - 44 U/L   Alkaline Phosphatase 64 38 - 126 U/L   Total Bilirubin 2.2 (H) 0.3 - 1.2 mg/dL   GFR, Estimated 56 (L) >60 mL/min    Comment: (NOTE) Calculated using the CKD-EPI Creatinine Equation (2021)    Anion gap 14 5 - 15    Comment: Performed at [facility], [address] [place], [place] [address]   VAS [name] LOWER EXTREMITY VENOUS (DVT) (ONLY [facility] & [facility])  Result Date: 02/10/2023  Lower Venous DVT Study Patient Name:  Jill Snow  Date of Exam:   02/10/2023 Medical Rec #: [id number]            Accession #:    [id number] Date of Birth: 04/17/1934             Patient Gender: F Patient Age:   7 years Exam Location:  [facility] Procedure:      VAS [name] LOWER EXTREMITY VENOUS (DVT) Referring Phys: Benjiman Core --------------------------------------------------------------------------------  Indications: Leg pain, s/p fall.  Anticoagulation: Eliquis. Comparison Study: 05-16-2018 Prior left lower extremity was negative for DVT. Performing Technologist: Jean Rosenthal RDMS, RVT  Examination Guidelines: A complete evaluation includes B-mode imaging, spectral Doppler, color Doppler, and power Doppler as needed of all accessible portions of each vessel. Bilateral testing is considered an integral part of a complete examination. Limited examinations for reoccurring indications may be performed as noted. The reflux portion of the exam is performed with the patient in reverse Trendelenburg.  +---------+---------------+---------+-----------+----------+--------------+ RIGHT     CompressibilityPhasicitySpontaneityPropertiesThrombus Aging +---------+---------------+---------+-----------+----------+--------------+ CFV      Full           Yes      Yes                                 +---------+---------------+---------+-----------+----------+--------------+ SFJ  Full                                                        +---------+---------------+---------+-----------+----------+--------------+ FV Prox  Full                                                        +---------+---------------+---------+-----------+----------+--------------+ FV Mid   Full                                                        +---------+---------------+---------+-----------+----------+--------------+ FV DistalFull                                                        +---------+---------------+---------+-----------+----------+--------------+ PFV      Full                                                        +---------+---------------+---------+-----------+----------+--------------+ POP      Full           Yes      Yes                                 +---------+---------------+---------+-----------+----------+--------------+ PTV      Full                                                        +---------+---------------+---------+-----------+----------+--------------+ PERO     Full                                                        +---------+---------------+---------+-----------+----------+--------------+   +----+---------------+---------+-----------+----------+--------------+ LEFTCompressibilityPhasicitySpontaneityPropertiesThrombus Aging +----+---------------+---------+-----------+----------+--------------+ CFV Full           Yes      Yes                                 +----+---------------+---------+-----------+----------+--------------+     Summary: RIGHT: - There is no evidence of deep vein thrombosis in the lower  extremity.  - No cystic structure found in the popliteal fossa.  LEFT: - No evidence of common femoral vein obstruction.  *See table(s) above for measurements and observations.    Preliminary    CT FEMUR RIGHT W CONTRAST  Result Date: 02/10/2023 CLINICAL DATA:  Right hip pain and bruising. EXAM: CT OF THE LOWER RIGHT EXTREMITY WITH CONTRAST TECHNIQUE: Multidetector CT imaging of the lower right extremity was performed according to the standard protocol following intravenous contrast administration. RADIATION DOSE REDUCTION: This exam was performed according to the departmental dose-optimization program which includes automated exposure control, adjustment of the mA and/or kV according to patient size and/or use of iterative reconstruction technique. CONTRAST:  75mL OMNIPAQUE IOHEXOL 350 MG/ML SOLN COMPARISON:  Pelvic x-rays from same day. FINDINGS: Bones/Joint/Cartilage No fracture or dislocation. Mild degenerative changes of the right hip and knee. No significant joint effusion. Ligaments Ligaments are suboptimally evaluated by CT. Muscles and Tendons Asymmetric isodense enlargement of the distal right iliopsoas muscle (series 4, image 50) as well as the right pectineus muscle (series 4, image 69). No contrast extravasation. Right gluteus minimus and medius muscle atrophy. Soft tissue Soft tissue swelling of the lateral and posterior distal thigh and knee. No fluid collection or hematoma. No soft tissue mass. Small fat containing right hernia. IMPRESSION: 1. Asymmetric isodense enlargement of the distal right iliopsoas muscle and right pectineus muscle, concerning for intramuscular hematomas. No evidence of active bleeding. 2. Soft tissue swelling of the lateral and posterior distal thigh and knee. No fluid collection or hematoma. 3. No acute osseous abnormality. Electronically Signed   By: Obie Dredge M.D.   On: 02/10/2023 12:28   CT HEAD WO CONTRAST ( )  Result Date: 02/10/2023 CLINICAL DATA:  Head  trauma, moderate-severe; Neck trauma (Age >= 65y) EXAM: CT HEAD WITHOUT CONTRAST CT CERVICAL SPINE WITHOUT CONTRAST TECHNIQUE: Multidetector CT imaging of the head and cervical spine was performed following the standard protocol without intravenous contrast. Multiplanar CT image reconstructions of the cervical spine were also generated. RADIATION DOSE REDUCTION: This exam was performed according to the departmental dose-optimization program which includes automated exposure control, adjustment of the mA and/or kV according to patient size and/or use of iterative reconstruction technique. COMPARISON:  Cervical spine radiograph 05/19/2017 FINDINGS: CT HEAD FINDINGS Limitations: Motion degraded exam. Brain: No evidence of hemorrhage, hydrocephalus, extra-axial collection or mass lesion/mass effect. Sequela of moderate chronic microvascular ischemic change. Generalized volume loss. There is a subtle region of loss gray-white differentiation right occipital lobe, which may be artifactual or represent a site of age indeterminate infarct. Vascular: No hyperdense vessel or unexpected calcification. Skull: Soft tissue stranding in the periorbital soft tissues on the left with a small hematoma. No evidence of an underlying calvarial fracture. Sinuses/Orbits: Middle ear or mastoid effusion. Near-complete opacification of the right maxillary sinus with osseous changes suggestive of chronic right maxillary sinusitis. Bilateral lens replacement. Orbits are otherwise unremarkable. Other: None. CT CERVICAL SPINE FINDINGS Alignment: Trace retrolisthesis of C5 on C6, slightly progressed compared to 2018. Skull base and vertebrae: No acute fracture. No primary bone lesion or focal pathologic process. Soft tissues and spinal canal: No prevertebral fluid or swelling. No visible canal hematoma. Disc levels:  No evidence of high-grade spinal canal stenosis. Upper chest: Negative. Other: 0.5 cm hypodense left thyroid nodule, unchanged  compared to 2021. IMPRESSION: 1. Soft tissue injury to the periorbital soft tissues on the left. No evidence of underlying calvarial fracture or intracranial injury. 2. Subtle region of loss gray-white differentiation in the right occipital lobe may be artifactual in the setting of significant motion artifact or represent a site of and age indeterminate infarct. If there is clinical concern for acute infarct, consider further evaluation with MRI. 3. No acute cervical spine  fracture. Electronically Signed   By: Lorenza Cambridge M.D.   On: 02/10/2023 10:14   CT Cervical Spine Wo Contrast  Result Date: 02/10/2023 CLINICAL DATA:  Head trauma, moderate-severe; Neck trauma (Age >= 65y) EXAM: CT HEAD WITHOUT CONTRAST CT CERVICAL SPINE WITHOUT CONTRAST TECHNIQUE: Multidetector CT imaging of the head and cervical spine was performed following the standard protocol without intravenous contrast. Multiplanar CT image reconstructions of the cervical spine were also generated. RADIATION DOSE REDUCTION: This exam was performed according to the departmental dose-optimization program which includes automated exposure control, adjustment of the mA and/or kV according to patient size and/or use of iterative reconstruction technique. COMPARISON:  Cervical spine radiograph 05/19/2017 FINDINGS: CT HEAD FINDINGS Limitations: Motion degraded exam. Brain: No evidence of hemorrhage, hydrocephalus, extra-axial collection or mass lesion/mass effect. Sequela of moderate chronic microvascular ischemic change. Generalized volume loss. There is a subtle region of loss gray-white differentiation right occipital lobe, which may be artifactual or represent a site of age indeterminate infarct. Vascular: No hyperdense vessel or unexpected calcification. Skull: Soft tissue stranding in the periorbital soft tissues on the left with a small hematoma. No evidence of an underlying calvarial fracture. Sinuses/Orbits: Middle ear or mastoid effusion.  Near-complete opacification of the right maxillary sinus with osseous changes suggestive of chronic right maxillary sinusitis. Bilateral lens replacement. Orbits are otherwise unremarkable. Other: None. CT CERVICAL SPINE FINDINGS Alignment: Trace retrolisthesis of C5 on C6, slightly progressed compared to 2018. Skull base and vertebrae: No acute fracture. No primary bone lesion or focal pathologic process. Soft tissues and spinal canal: No prevertebral fluid or swelling. No visible canal hematoma. Disc levels:  No evidence of high-grade spinal canal stenosis. Upper chest: Negative. Other: 0.5 cm hypodense left thyroid nodule, unchanged compared to 2021. IMPRESSION: 1. Soft tissue injury to the periorbital soft tissues on the left. No evidence of underlying calvarial fracture or intracranial injury. 2. Subtle region of loss gray-white differentiation in the right occipital lobe may be artifactual in the setting of significant motion artifact or represent a site of and age indeterminate infarct. If there is clinical concern for acute infarct, consider further evaluation with MRI. 3. No acute cervical spine fracture. Electronically Signed   By: Lorenza Cambridge M.D.   On: 02/10/2023 10:14   DG Pelvis Portable  Result Date: 02/10/2023 CLINICAL DATA:  Fall EXAM: PORTABLE PELVIS 1 VIEWS COMPARISON:  None Available. FINDINGS: No evidence of pelvic fracture or diastasis. Mild degenerative changes of the bilateral hips. Degenerative changes of the pubic symphysis and partially visualized lumbar spine. Diffuse demineralization. No pelvic bone lesions are seen. IMPRESSION: No evidence of displaced fracture. Diffuse demineralization limits evaluation for nondisplaced fracture, if there is continued clinical concern recommend further evaluation with cross-sectional imaging. Electronically Signed   By: Allegra Lai M.D.   On: 02/10/2023 09:23   DG Chest Portable 1 View  Result Date: 02/10/2023 CLINICAL DATA:  Status post  fall. EXAM: PORTABLE CHEST 1 VIEW COMPARISON:  Dec 25, 2022 FINDINGS: Calcific atherosclerotic disease and tortuosity of the aorta. Cardiomediastinal silhouette is normal. Mediastinal contours appear intact. There is no evidence of focal airspace consolidation, pleural effusion or pneumothorax. Chronic elevation of the left hemidiaphragm with large hiatal hernia. Osseous structures are without acute abnormality. Soft tissues are grossly normal. IMPRESSION: No active disease. Electronically Signed   By: Ted Mcalpine M.D.   On: 02/10/2023 09:19    Pending Labs [employer] (From admission, onward)     Start     Ordered  02/11/23 0500  CBC  Tomorrow morning,   R        02/10/23 1425   02/11/23 0500  Basic metabolic panel  Tomorrow morning,   R        02/10/23 1425   02/10/23 1354  Urinalysis, w/ Reflex to Culture (Infection Suspected) -Urine, Unspecified Source  Once,   URGENT       Question:  Specimen Source  Answer:  Urine, Unspecified Source   02/10/23 1353            Vitals/Pain Today's Vitals   02/10/23 0858 02/10/23 0900 02/10/23 0903 02/10/23 1230  BP: (!) 132/54 (!) 139/59  107/67  Pulse:  93  75  Resp:  20  16  Temp:  98.2 F (36.8 C)    TempSrc:  Oral    SpO2:  100%  97%  Weight:   68 kg   Height:   5\' 8"  (1.727 m)   PainSc:        Isolation Precautions No active isolations  Medications Medications  amiodarone (PACERONE) tablet 200 mg (has no administration in time range)  atorvastatin (LIPITOR) tablet 40 mg (has no administration in time range)  furosemide (LASIX) tablet 20 mg (has no administration in time range)  pantoprazole (PROTONIX) EC tablet 40 mg (has no administration in time range)  polyethylene glycol (MIRALAX / GLYCOLAX) packet 17 g (has no administration in time range)  potassium chloride SA (KLOR-CON M) CR tablet 20 mEq (has no administration in time range)  montelukast (SINGULAIR) tablet 10 mg (has no administration in time range)   OLANZapine (ZYPREXA) injection 2.5 mg (has no administration in time range)  enoxaparin (LOVENOX) injection 40 mg (has no administration in time range)  HYDROmorphone (DILAUDID) injection 0.5-1 mg (has no administration in time range)  acetaminophen (TYLENOL) tablet 650 mg (has no administration in time range)    Or  acetaminophen (TYLENOL) suppository 650 mg (has no administration in time range)  ondansetron (ZOFRAN) tablet 4 mg (has no administration in time range)    Or  ondansetron (ZOFRAN) injection 4 mg (has no administration in time range)  LORazepam (ATIVAN) injection 0.5 mg (0.5 mg Intravenous Given 02/10/23 1148)  iohexol (OMNIPAQUE) 350 MG/ML injection 75 mL (75 mLs Intravenous Contrast Given 02/10/23 1210)    Mobility walks with device     Focused Assessments Neuro Assessment Handoff:  Swallow screen pass?          Neuro Assessment:   Neuro Checks:      Has TPA been given?  If patient is a Neuro Trauma and patient is going to OR before floor call report to 4N Charge nurse: [phone] or [phone]   R Recommendations: See Admitting Provider Note  Report given to:   Additional Notes:

## 2023-02-10 NOTE — ED Triage Notes (Signed)
Patient fell unknown time last night, right hip has been bothering her and that may be why she fell according to family. Patient is on eliquis, has obvious bruising to forehead.

## 2023-02-10 NOTE — ED Notes (Signed)
ED Provider at bedside. 

## 2023-02-10 NOTE — ED Notes (Signed)
Ultrasound at bedside

## 2023-02-10 NOTE — ED Notes (Signed)
Patient transported to X-ray 

## 2023-02-10 NOTE — Progress Notes (Signed)
Orthopedic Tech Progress Note Patient Details:  Jill Snow Aug 09, 1934 [id number]  Level 2 trauma   Patient ID: Jill Snow, female   DOB: 01-15-1934, 89 y.o.   MRN: [id number]  Jill Snow 02/10/2023, 9:13 AM

## 2023-02-10 NOTE — TOC CAGE-AID Note (Signed)
Transition of Care [facility]) - CAGE-AID Screening   Patient Details  Name: Jill Snow MRN: [id number] Date of Birth: 1933-09-29  Transition of Care [facility]) CM/SW Contact:    Leota Sauers, RN Phone Number: 02/10/2023, 9:11 PM   Clinical Narrative:  Patient denies use of alcohol and illicit drugs. Education not offered at this time.  CAGE-AID Screening:    Have You Ever Felt You Ought to Cut Down on Your Drinking or Drug Use?: No Have People Annoyed You By Critizing Your Drinking Or Drug Use?: No Have You Felt Bad Or Guilty About Your Drinking Or Drug Use?: No Have You Ever Had a Drink or Used Drugs First Thing In The Morning to Steady Your Nerves or to Get Rid of a Hangover?: No CAGE-AID Score: 0  Substance Abuse Education Offered: No

## 2023-02-10 NOTE — ED Notes (Signed)
Pt changed, clean brief and clean linens placed on bed. Warm blanket provided. Call bell in reach. Daughter at bedside. No other needs at this time.

## 2023-02-11 ENCOUNTER — Observation Stay (HOSPITAL_BASED_OUTPATIENT_CLINIC_OR_DEPARTMENT_OTHER): Payer: Medicare HMO

## 2023-02-11 DIAGNOSIS — G9340 Encephalopathy, unspecified: Secondary | ICD-10-CM

## 2023-02-11 DIAGNOSIS — I5031 Acute diastolic (congestive) heart failure: Secondary | ICD-10-CM | POA: Diagnosis not present

## 2023-02-11 DIAGNOSIS — I480 Paroxysmal atrial fibrillation: Secondary | ICD-10-CM | POA: Diagnosis not present

## 2023-02-11 DIAGNOSIS — S301XXA Contusion of abdominal wall, initial encounter: Secondary | ICD-10-CM | POA: Diagnosis not present

## 2023-02-11 LAB — ECHOCARDIOGRAM COMPLETE
Area-P 1/2: 2.99 cm2
Height: 68 in
MV M vel: 5.49 m/s
MV Peak grad: 120.6 mmHg
MV VTI: 1.3 cm2
Radius: 0.3 cm
S' Lateral: 2.5 cm
Weight: 2400 oz

## 2023-02-11 LAB — URINALYSIS, W/ REFLEX TO CULTURE (INFECTION SUSPECTED)
Bilirubin Urine: NEGATIVE
Glucose, UA: NEGATIVE mg/dL
Ketones, ur: NEGATIVE mg/dL
Nitrite: NEGATIVE
Protein, ur: NEGATIVE mg/dL
Specific Gravity, Urine: 1.01 (ref 1.005–1.030)
WBC, UA: >50 WBC/hpf (ref 0–5)
pH: 6 (ref 5.0–8.0)

## 2023-02-11 LAB — BASIC METABOLIC PANEL
Anion gap: 8 (ref 5–15)
BUN: 11 mg/dL (ref 8–23)
CO2: 27 mmol/L (ref 22–32)
Calcium: 9 mg/dL (ref 8.9–10.3)
Chloride: 104 mmol/L (ref 98–111)
Creatinine, Ser: 0.89 mg/dL (ref 0.44–1.00)
GFR, Estimated: >60 mL/min (ref >60)
Glucose, Bld: 96 mg/dL (ref 70–99)
Potassium: 3.7 mmol/L (ref 3.5–5.1)
Sodium: 139 mmol/L (ref 135–145)

## 2023-02-11 LAB — CBC
HCT: 33.6 % — ABNORMAL LOW (ref 36.0–46.0)
Hemoglobin: 10.9 g/dL — ABNORMAL LOW (ref 12.0–15.0)
MCH: 32.2 pg (ref 26.0–34.0)
MCHC: 32.4 g/dL (ref 30.0–36.0)
MCV: 99.1 fL (ref 80.0–100.0)
Platelets: 229 10*3/uL (ref 150–400)
RBC: 3.39 MIL/uL — ABNORMAL LOW (ref 3.87–5.11)
RDW: 12.5 % (ref 11.5–15.5)
WBC: 4.6 10*3/uL (ref 4.0–10.5)
nRBC: 0 % (ref 0.0–0.2)

## 2023-02-11 LAB — GLUCOSE, CAPILLARY: Glucose-Capillary: 106 mg/dL — ABNORMAL HIGH (ref 70–99)

## 2023-02-11 MED ORDER — STERILE WATER FOR INJECTION IJ SOLN
INTRAMUSCULAR | Status: AC
  Filled 2023-02-11: qty 10

## 2023-02-11 MED ORDER — HALOPERIDOL LACTATE 5 MG/ML IJ SOLN
2.0000 mg | Freq: Four times a day (QID) | INTRAMUSCULAR | Status: DC | PRN
  Administered 2023-02-11: 2 mg via INTRAVENOUS

## 2023-02-11 MED ORDER — QUETIAPINE FUMARATE 25 MG PO TABS
25.0000 mg | ORAL_TABLET | Freq: Two times a day (BID) | ORAL | Status: DC | PRN
  Administered 2023-02-11: 25 mg via ORAL

## 2023-02-11 MED ORDER — HALOPERIDOL LACTATE 5 MG/ML IJ SOLN
4.0000 mg | Freq: Four times a day (QID) | INTRAMUSCULAR | Status: DC | PRN
  Administered 2023-02-12: 4 mg via INTRAVENOUS
  Filled 2023-02-11: qty 1

## 2023-02-11 MED ORDER — QUETIAPINE FUMARATE 25 MG PO TABS
25.0000 mg | ORAL_TABLET | Freq: Every day | ORAL | Status: DC
  Administered 2023-02-11: 25 mg via ORAL
  Filled 2023-02-11 (×2): qty 1

## 2023-02-11 MED ORDER — MELATONIN 5 MG PO TABS
5.0000 mg | ORAL_TABLET | Freq: Every day | ORAL | Status: DC
  Administered 2023-02-11: 5 mg via ORAL
  Filled 2023-02-11: qty 1

## 2023-02-11 NOTE — Evaluation (Signed)
Physical Therapy Evaluation Patient Details Name: Jill Snow MRN: [id number] DOB: [birth year] Today's Date: 02/11/2023  History of Present Illness  Pt is an 89 y.o. female presenting 6/26 after fall. Pt with R iliopsoas and pectineus mulcle enlargement concerning for hematomas. CT head with soft tissue periorbital injury on the L. PMH significant for Alzheimer's dementia, chronic HFpEF, HTN, PAF on Eliquis, HLD  Clinical Impression  Pt presents today with impaired functional mobility, limited by cognition, safety awareness, and balance. Pt has a history of dementia, pt's daughter arriving during session and providing history. At baseline, pt is ambulatory without AD, able to climb 3 flights of stairs to manage into daughter's apartment. Daughter does report a few falls at home, concerned about ability to care for pt. Pt required minA and increased cueing throughout the session for redirection, RW utilized initially as RLE antalgic gait noted, but pt leaving RW behind as she is not used to utilizing one. Pt distracted by environment and wanting to find her family, increased time to redirect as pt in an unfamiliar environment but did better once daughter present. Pt will continue to benefit from skilled acute PT to progress safety and mobility, recommend subacute PT upon discharge to return pt back to her PLOF and decrease caregiver burden. Acute PT will follow as appropriate.        Recommendations for follow up therapy are one component of a multi-disciplinary discharge planning process, led by the attending physician.  Recommendations may be updated based on patient status, additional functional criteria and insurance authorization.  Follow Up Recommendations Can patient physically be transported by private vehicle: Yes     Assistance Recommended at Discharge Frequent or constant Supervision/Assistance  Patient can return home with the following  A little help with walking and/or  transfers;A little help with bathing/dressing/bathroom;Assistance with cooking/housework;Direct supervision/assist for medications management;Direct supervision/assist for financial management;Assist for transportation;Help with stairs or ramp for entrance    Equipment Recommendations None recommended by PT  Recommendations for Other Services       Functional Status Assessment Patient has had a recent decline in their functional status and demonstrates the ability to make significant improvements in function in a reasonable and predictable amount of time.     Precautions / Restrictions Precautions Precautions: Fall;Other (comment) Precaution Comments: dementia/delirium Restrictions Weight Bearing Restrictions: No      Mobility  Bed Mobility Overal bed mobility: Needs Assistance Bed Mobility: Supine to Sit, Sit to Supine     Supine to sit: Min guard, HOB elevated Sit to supine: Mod assist, HOB elevated   General bed mobility comments: close guard to EOB for safety at EOB. Increased cueing and assist to manage BLE back into bed    Transfers Overall transfer level: Needs assistance Equipment used: Rolling walker (2 wheels), None Transfers: Sit to/from Stand Sit to Stand: Min guard, Min assist           General transfer comment: MinA provided for safety and balance due to cognition, able to stand x1 trial with minG, constant redirection needed    Ambulation/Gait Ambulation/Gait assistance: Min assist Gait Distance (Feet): 50 Feet Assistive device: Rolling walker (2 wheels), 1 person hand held assist Gait Pattern/deviations: Step-through pattern, Decreased stance time - right, Decreased dorsiflexion - right, Antalgic Gait velocity: mildly decreased     General Gait Details: pt provided RW and ambulating with mild antalgic gait on RLE, minA for safety and constant cueing for task. Pt leaving RW behind in the hallway as pt  distracted looking for her family, unable to  redirect to utilize RW but managing with 1HHA.  Stairs            Wheelchair Mobility    Modified Rankin (Stroke Patients Only)       Balance Overall balance assessment: Needs assistance Sitting-balance support: No upper extremity supported, Feet supported Sitting balance-Leahy Scale: Good     Standing balance support: Single extremity supported, Bilateral upper extremity supported, During functional activity, Reliant on assistive device for balance Standing balance-Leahy Scale: Poor Standing balance comment: reliant on RW or HHA for balance with external support for safety                             Pertinent Vitals/Pain Pain Assessment Pain Assessment: Faces Faces Pain Scale: No hurt    Home Living Family/patient expects to be discharged to:: Private residence Living Arrangements: Children Available Help at Discharge: Family;Available 24 hours/day Type of Home: House Home Access: Stairs to enter Entrance Stairs-Rails: Right;Left;Can reach both Entrance Stairs-Number of Steps: 3 flights is the most she has to manage   Home Layout: One level Home Equipment: Rollator (4 wheels);BSC/3in1 Additional Comments: lives with son every night and rotates days with daughters, most steps she has to manage is 3 flights to her daughters apartment, as noted above. Takes shower at other daughters house with walk in shower [facility] available if she were to need shower seat.    Prior Function Prior Level of Function : Needs assist;History of Falls (last six months)  Cognitive Assist : Mobility (cognitive);ADLs (cognitive) Mobility (Cognitive): Set up cues ADLs (Cognitive): Intermittent cues       Mobility Comments: Pt's daughter reports pt ambulatory with no AD at baseline, managing 3 flights of stairs. Does report a few falls at home recently ADLs Comments: assist with shower for effectiveness and safety. Can normally dress self and take self to restroom. More recently has  needd intermittent use of depends for bowel/bladder management due to cognitive decline. Assist with all IADL.     Hand Dominance        Extremity/Trunk Assessment   Upper Extremity Assessment Upper Extremity Assessment: Defer to OT evaluation    Lower Extremity Assessment Lower Extremity Assessment: Difficult to assess due to impaired cognition (able to move all extremities against gravity, noted RLE swelling and bruising)       Communication   Communication: No difficulties  Cognition Arousal/Alertness: Awake/alert Behavior During Therapy: WFL for tasks assessed/performed, Restless Overall Cognitive Status: History of cognitive impairments - at baseline                                 General Comments: Pt able to state her birthday, but not familiar with place. Pt often distracted by looking for family and needing to get back home to her children. Pt's daughter arrived during session and pt more relaxed and compliant. Pt with fluctuating attention to task, requiring redirection and increased time, inconsistently following commands        General Comments General comments (skin integrity, edema, etc.): noted bruising to L side of face and RLE. Pt's daughter entering at end of session, able to redirect pt and provide home set up and PLOF. Daughter reporting fear of pt falling at home, seems to have significant caregiver burden    Exercises     Assessment/Plan    PT Assessment  Patient needs continued PT services  PT Problem List Decreased strength;Decreased activity tolerance;Decreased balance;Decreased mobility;Decreased cognition;Decreased knowledge of use of DME;Decreased safety awareness;Decreased knowledge of precautions       PT Treatment Interventions      PT Goals (Current goals can be found in the Care Plan section)  Acute Rehab PT Goals Patient Stated Goal: daughter reports the goal is to progress to pt not being a fall risk PT Goal Formulation:  With patient/family Time For Goal Achievement: 02/25/23 Potential to Achieve Goals: Fair    Frequency Min 2X/week     Co-evaluation               AM-PAC PT "6 Clicks" Mobility  Outcome Measure Help needed turning from your back to your side while in a flat bed without using bedrails?: A Little Help needed moving from lying on your back to sitting on the side of a flat bed without using bedrails?: A Little Help needed moving to and from a bed to a chair (including a wheelchair)?: A Little Help needed standing up from a chair using your arms (e.g., wheelchair or bedside chair)?: A Little Help needed to walk in hospital room?: A Little Help needed climbing 3-5 steps with a railing? : Total 6 Click Score: 16    End of Session   Activity Tolerance: Patient tolerated treatment well Patient left: in bed;with call bell/phone within reach;with bed alarm set;with family/visitor present Nurse Communication: Mobility status PT Visit Diagnosis: Unsteadiness on feet (R26.81);Repeated falls (R29.6);Other abnormalities of gait and mobility (R26.89);Difficulty in walking, not elsewhere classified (R26.2)    Time: 0865-7846 PT Time Calculation (min) (ACUTE ONLY): 32 min   Charges:   PT Evaluation $PT Eval Moderate Complexity: 1 Mod          Lindalou Hose, PT DPT Acute Rehabilitation Services Office [phone]   Leonie Man 02/11/2023, 1:49 PM

## 2023-02-11 NOTE — Care Management Obs Status (Signed)
MEDICARE OBSERVATION STATUS NOTIFICATION   Patient Details  Name: Jill Snow MRN: [id number] Date of Birth: [birth year]   Medicare Observation Status Notification Given:  Yes    Mearl Latin, LCSW 02/11/2023, 2:32 PM

## 2023-02-11 NOTE — TOC Progression Note (Addendum)
Transition of Care [facility]) - Progression Note    Patient Details  Name: Jill Snow MRN: [id number] Date of Birth: [birth year]  Transition of Care [facility]) CM/SW Contact  Mearl Latin, LCSW Phone Number: 02/11/2023, 2:49 PM  Clinical Narrative:    2:40pm-[facility] able to accept patient pending medical stability. CSW submitted for insurance authorization.  2:50pm-Insurance approval received for [facility], Ref# [id number], effective 02/12/2023-02/16/2023. CSW left voicemail for patient's daughter [name] as she had to go to work.   3:20pm-CSW received return call from [place]. She stated she will likely be able to transport patient by car but CSW will confirm with her at discharge.    Expected Discharge Plan: Skilled Nursing Facility Barriers to Discharge: Continued Medical Work up, English as a second language teacher, SNF Pending bed offer  Expected Discharge Plan and Services In-house Referral: Clinical Social Work   Post Acute Care Choice: Skilled Nursing Facility Living arrangements for the past 2 months: Single Family Home                                       Social Determinants of Health (SDOH) Interventions SDOH Screenings   Food Insecurity: No Food Insecurity (12/28/2022)  Housing: Low Risk  (12/28/2022)  Transportation Needs: No Transportation Needs (12/28/2022)  Utilities: Not At Risk (12/27/2022)  Depression (PHQ2-9): Low Risk  (11/10/2022)  Financial Resource Strain: Low Risk  (06/02/2021)  Physical Activity: Insufficiently Active (06/02/2021)  Social Connections: Unknown (06/02/2021)  Stress: No Stress Concern Present (06/02/2021)  Tobacco Use: Low Risk  (02/10/2023)    Readmission Risk Interventions     No data to display

## 2023-02-11 NOTE — NC FL2 (Signed)
[place] MEDICAID FL2 LEVEL OF CARE FORM     IDENTIFICATION  Patient Name: Jill Snow Birthdate: 1934/05/13 Sex: female Admission Date (Current Location): 02/10/2023  [facility] and [place] Number:  Chiropodist and Address:  [facility], [address] [place], [place] [address]      Provider Number: [id number]  Attending Physician Name and Address:  Maretta Bees, MD  Relative Name and Phone Number:       Current Level of Care: Hospital Recommended Level of Care: Skilled Nursing Facility Prior Approval Number:    Date Approved/Denied:   PASRR Number: [id number]  Discharge Plan: SNF    Current Diagnoses: Patient Active Problem List   Diagnosis Date Noted   Psoas hematoma, right, secondary to anticoagulant therapy, initial encounter 02/10/2023   Fall 02/10/2023   Thrombocytopenia (HCC) 01/05/2023   Sepsis secondary to UTI (HCC) 12/26/2022   UTI (urinary tract infection) 12/25/2022   AMS (altered mental status) 12/25/2022   Venous insufficiency 11/10/2022   Cellulitis 03/23/2022   Encounter for dental examination and cleaning without abnormal findings 09/17/2021   Late onset Alzheimer's dementia with behavioral disturbance (HCC) 06/03/2021   Urinary urgency 05/26/2021   Tremor of right hand 05/26/2021   Dry eyes 12/24/2020   Chronic systolic CHF (congestive heart failure) (HCC) 12/16/2020   Cellulitis of left arm 12/22/2019   Left arm pain 12/22/2019   History of non anemic vitamin B12 deficiency 06/27/2018   Aortic atherosclerosis (HCC) 04/06/2018   Allergic rhinitis 05/19/2017   Lower extremity edema 03/12/2017   Lung nodule 12/10/2016   Low back pain 07/23/2016   Fatty liver 04/22/2016   Neuropathy 02/11/2016   Mitral regurgitation    Vitamin D deficiency 08/29/2014   Obstructive sleep apnea on CPAP 03/07/2014   Atrial fibrillation (HCC)    Hypercholesterolemia    RBBB (right bundle branch block)      Orientation RESPIRATION BLADDER Height & Weight     Self  Normal Incontinent Weight: 150 lb (68 kg) Height:  5\' 8"  (172.7 cm)  BEHAVIORAL SYMPTOMS/MOOD NEUROLOGICAL BOWEL NUTRITION STATUS      Continent Diet (See dc summary)  AMBULATORY STATUS COMMUNICATION OF NEEDS Skin   Limited Assist Verbally Bruising                       Personal Care Assistance Level of Assistance  Bathing, Feeding, Dressing Bathing Assistance: Limited assistance Feeding assistance: Independent Dressing Assistance: Limited assistance     Functional Limitations Info             SPECIAL CARE FACTORS FREQUENCY  PT (By licensed PT), OT (By licensed OT)     PT Frequency: 5x/week OT Frequency: 5x/week            Contractures Contractures Info: Not present    Additional Factors Info  Code Status, Allergies Code Status Info: Full Allergies Info: Augmentin (Amoxicillin-pot Clavulanate), Codeine, Crestor (Rosuvastatin), Penicillins           Current Medications (02/11/2023):  This is the current hospital active medication list Current Facility-Administered Medications  Medication Dose Route Frequency Provider Last Rate Last Admin   acetaminophen (TYLENOL) tablet 650 mg  650 mg Oral Q6H PRN Mikey College T, MD   650 mg at 02/11/23 0825   Or   acetaminophen (TYLENOL) suppository 650 mg  650 mg Rectal Q6H PRN Emeline General, MD       amiodarone (PACERONE) tablet 200 mg  200 mg Oral Daily Mikey College T, MD   200 mg at 02/11/23 0825   atorvastatin (LIPITOR) tablet 40 mg  40 mg Oral Daily Mikey College T, MD   40 mg at 02/11/23 0825   furosemide (LASIX) tablet 20 mg  20 mg Oral Daily Mikey College T, MD   20 mg at 02/11/23 0825   HYDROmorphone (DILAUDID) injection 0.5-1 mg  0.5-1 mg Intravenous Q2H PRN Mikey College T, MD       montelukast (SINGULAIR) tablet 10 mg  10 mg Oral QHS Mikey College T, MD   10 mg at 02/10/23 2234   OLANZapine (ZYPREXA) injection 2.5 mg  2.5 mg Intramuscular Q6H PRN Mikey College T, MD   2.5 mg at 02/11/23 0924   ondansetron (ZOFRAN) tablet 4 mg  4 mg Oral Q6H PRN Mikey College T, MD       Or   ondansetron [facility]) injection 4 mg  4 mg Intravenous Q6H PRN Mikey College T, MD       pantoprazole (PROTONIX) EC tablet 40 mg  40 mg Oral Daily Mikey College T, MD   40 mg at 02/11/23 0825   polyethylene glycol (MIRALAX / GLYCOLAX) packet 17 g  17 g Oral Daily PRN Mikey College T, MD       potassium chloride SA (KLOR-CON M) CR tablet 20 mEq  20 mEq Oral QHS Emeline General, MD   20 mEq at 02/10/23 2234     Discharge Medications: Please see discharge summary for a list of discharge medications.  Relevant Imaging Results:  Relevant Lab Results:   Additional Information SSN: [id number] [place], [place]

## 2023-02-11 NOTE — TOC Initial Note (Signed)
Transition of Care [facility]) - Initial/Assessment Note    Patient Details  Name: Jill Snow MRN: [id number] Date of Birth: 21-May-1934  Transition of Care [facility]) CM/SW Contact:    Mearl Latin, LCSW Phone Number: 02/11/2023, 1:26 PM  Clinical Narrative:                 CSW received consult for possible SNF placement at time of discharge. CSW spoke with patient and daughter, Jill Snow, at bedside. Patient pleasant but dosed off after a few minutes. Daughter reported that patient rotates staying with her and her brother and sister. She is currently concerned due to patient's fall risk on blood thinners. She expressed understanding of PT recommendation and is agreeable to SNF placement at time of discharge. Patient reports preference for [facility]. CSW discussed insurance authorization process and will provide Medicare SNF ratings list. CSW will send out referrals for review and provide bed offers as available.   Skilled Nursing Rehab Facilities-   [email or web address]   Ratings out of 5 stars (5 the highest)   Name Address  Phone # Quality Care Staffing Health Inspection Overall  [facility] [address] [phone] 2 1 5 4   [facility] [address], [place] [phone] 4 1 3 2   [facility] [address], [facility] [phone] 2 1 1 1   [facility] [address], [place] [phone] 4 1 3 2   [facility]  [address], [place] [phone] 3 2 5 5   [facility] [address] [facility] [phone] 2 1 2 1   [facility] [address] [place] [phone] 4 1 2 1   [facility] [phone] [address], [place] [phone] 2 4 3 3   [address] ([facility]) [address] [place] [phone] 3 2 2 2   [facility] [address] [place], [place] [phone] 1 2 1 1   [facility] ([place]) [address], [place] [phone] 3 1 1  1   [facility]igh[facility] [address] Liliane Shi [phone] 4 3 4 4   [facility] [address], [place] [phone] 3 4 3 3           [facility] [address] [place] [phone]      [facility], [place] [address], [place] [phone] 1 1 2 1   [facility] [address], [employer] [phone] 2 2 4 4   [facility] [address] [phone] 2 1 4 3   [facility] [address] [place] [phone] 3 3 3 3           [address] (no [facility]) [address], [place] [phone] 4 4 5 5   [facility] (No Humana) [name] Lavera Guise [phone] 2 2 4 4   [facility] [address], [place] [phone] 2 1 2 1   [facility] (No UHC) [address], [place] [phone] 5 5 5 5   [facility] [address] [facility] [phone] 2 3 5 5   [facility] [address], [place] [phone] 2 1 1 1   [facility] [address] Liliane Shi [phone] 5 2 5 5   [facility]  [address], [place] [phone] 2 2 2 2   [facility] [address] [place] [phone] 4 2 1 1   [facility] [address] [place], [place] [phone] 2 2 3 3           [facility] [address], [place] [phone]  1 1 1 1   [facility] [address], Evlyn Clines  [phone] 2 3 3 3   [facility] (No Humana) [address], [place] [phone] 2 1 3 2   [facility]) [address], Rosalita Levan [phone] 1 1 1 1   [facility] [address], [place] Levan [phone] 3 2 5 5   [facility] [address], [place] [phone] 2 1 1 1           [facility] [address] [place], [place] [phone] 4 4 5 5   [facility])  [address] [place] [phone] 2 1 2 1   [facility]) [address], [place] [phone]  1 4 3   [facility] [address]  [place] [phone] 3 5 4 5   [address] [place], [place] [phone] 3 2 2 2   [facility]) [address] [place] [phone] 2 1 3 2      Expected Discharge Plan: Skilled Nursing Facility Barriers to Discharge: Continued Medical Work up, English as a second language teacher, SNF Pending bed offer   Patient Goals and CMS Choice Patient states their goals for this hospitalization and ongoing recovery are:: Rehab CMS Medicare.gov Compare Post Acute Care list provided to:: Patient Represenative (must comment) Choice offered to / list presented to : Adult Children [place] ownership interest in [facility]provided to:: Adult Children    Expected Discharge Plan and Services In-house Referral: Clinical Social Work   Post Acute Care Choice: Skilled Nursing Facility Living arrangements for the past 2 months: Single Family Home                                      Prior Living Arrangements/Services Living arrangements for the past 2 months: Single Family Home Lives with:: Adult Children Patient language and need for interpreter reviewed:: Yes Do you feel safe going back to the place where you live?: Yes      Need for Family Participation in Patient Care: Yes (Comment) Care giver support system in place?: Yes (comment)   Criminal Activity/Legal Involvement Pertinent to Current Situation/Hospitalization: No - Comment as needed  Activities of Daily Living      Permission Sought/Granted Permission sought to share information with : Facility Medical sales representative, Family Supports Permission granted to share information with : Yes, Verbal Permission Granted  Share Information with NAME: Angie  Permission granted to share info w AGENCY: SNFs  Permission granted to share info w Relationship: Daughter  Permission granted to share info w Contact Information: [phone]  Emotional Assessment Appearance:: Appears stated  age Attitude/Demeanor/Rapport: Gracious Affect (typically observed): Accepting, Appropriate, Pleasant Orientation: : Oriented to Self, Oriented to Place, Oriented to  Time, Oriented to Situation Alcohol / Substance Use: Not Applicable Psych Involvement: No (comment)  Admission diagnosis:  Fall [W19.XXXA] Encephalopathy [G93.40] Psoas hematoma, right, secondary to anticoagulant therapy, initial encounter [S30.1XXA] Patient Active Problem List   Diagnosis Date Noted   Psoas hematoma, right, secondary to anticoagulant therapy, initial encounter 02/10/2023   Fall 02/10/2023   Thrombocytopenia (HCC) 01/05/2023   Sepsis secondary to UTI (HCC) 12/26/2022   UTI (urinary tract infection) 12/25/2022   AMS (altered mental status) 12/25/2022   Venous insufficiency 11/10/2022   Cellulitis 03/23/2022   Encounter for dental examination and cleaning without abnormal findings 09/17/2021   Late onset Alzheimer's dementia with behavioral disturbance (HCC) 06/03/2021  Urinary urgency 05/26/2021   Tremor of right hand 05/26/2021   Dry eyes 12/24/2020   Chronic systolic CHF (congestive heart failure) (HCC) 12/16/2020   Cellulitis of left arm 12/22/2019   Left arm pain 12/22/2019   History of non anemic vitamin B12 deficiency 06/27/2018   Aortic atherosclerosis (HCC) 04/06/2018   Allergic rhinitis 05/19/2017   Lower extremity edema 03/12/2017   Lung nodule 12/10/2016   Low back pain 07/23/2016   Fatty liver 04/22/2016   Neuropathy 02/11/2016   Mitral regurgitation    Vitamin D deficiency 08/29/2014   Obstructive sleep apnea on CPAP 03/07/2014   Atrial fibrillation (HCC)    Hypercholesterolemia    RBBB (right bundle branch block)    PCP:  Glori Luis, MD Pharmacy:  No Pharmacies Listed    Social Determinants of Health (SDOH) Social History: SDOH Screenings   Food Insecurity: No Food Insecurity (12/28/2022)  Housing: Low Risk  (12/28/2022)  Transportation Needs: No Transportation  Needs (12/28/2022)  Utilities: Not At Risk (12/27/2022)  Depression (PHQ2-9): Low Risk  (11/10/2022)  Financial Resource Strain: Low Risk  (06/02/2021)  Physical Activity: Insufficiently Active (06/02/2021)  Social Connections: Unknown (06/02/2021)  Stress: No Stress Concern Present (06/02/2021)  Tobacco Use: Low Risk  (02/10/2023)   SDOH Interventions:     Readmission Risk Interventions     No data to display

## 2023-02-11 NOTE — Progress Notes (Addendum)
PROGRESS NOTE        PATIENT DETAILS Name: Jill Snow Age: 89 y.o. Sex: female Date of Birth: 09-Dec-1933 Admit Date: 02/10/2023 Admitting Physician Emeline General, MD [id number], Yehuda Mao, MD  Brief Summary: Patient is a 89 y.o.  female with history of dementia, HFpEF, HTN, A-fib on Eliquis-who sustained a unwitnessed fall-following which she had right hip pain.  Upon further evaluation-she was found to have hematoma involving right iliopsoas muscle.  Significant events: 6/26>> admit to [facility]  Significant studies: 6/26>> x-ray pelvis: No fracture 6/26>> x-ray chest: No PNA 6/26>> CT head: No acute intracranial abnormality 6/26>> CT C-spine: No fracture 6/26>> CT right femur: Intramuscular hematomas involving distal right iliopsoas/right pectineus.  No evidence of active bleeding.  Significant microbiology data: 6/27>> urine culture: Pending  Procedures: None  Consults: None  Subjective: Lying comfortably in bed-denies any chest pain or shortness of breath.  Very pleasantly confused.  Objective: Vitals: Blood pressure 113/71, pulse 75, temperature 97.9 F (36.6 C), temperature source Oral, resp. rate 20, height 5\' 8"  (1.727 m), weight 68 kg, SpO2 100 %.   Exam: Gen Exam:-not in any distress HEENT:atraumatic, normocephalic Chest: B/L clear to auscultation anteriorly CVS:S1S2 regular Abdomen:soft non tender, non distended Extremities:no edema-ecchymosis involving medial aspect of right upper thigh. Neurology: Non focal Skin: no rash  Pertinent Labs/Radiology:    Latest Ref Rng & Units 02/11/2023    5:57 AM 02/10/2023    9:27 AM 01/05/2023   11:28 AM  CBC  WBC 4.0 - 10.5 K/uL 4.6  15.0  5.6   Hemoglobin 12.0 - 15.0 g/dL 20.2  54.2  70.6   Hematocrit 36.0 - 46.0 % 33.6  32.4  40.3   Platelets 150 - 400 K/uL 229  258  262.0     Lab Results  Component Value Date   NA 139 02/11/2023   K 3.7 02/11/2023   CL 104 02/11/2023   CO2 27  02/11/2023      Assessment/Plan: Fall Unwitnessed Unclear if this was a mechanical fall or perhaps a syncopal episode Telemetry monitoring Echocardiogram pending Ambulate with PT/OT  Right iliopsoas/pectineus muscle hematoma with acute blood loss anemia Secondary to above-with Eliquis use Some pain with moving her legs-but with dementia-hard to assess degree of pain Supportive care Follow CBC daily Continue to hold Eliquis for now Will need to discuss risk/benefits with family before resuming anticoagulation  PAF Telemetry monitoring Amiodarone Eliquis on hold-see above  History of dementia Confused this morning-no family at bedside Maintain delirium precautions Supportive care  HLD Statin  BMI: Estimated body mass index is 22.81 kg/m as calculated from the following:   Height as of this encounter: 5\' 8"  (1.727 m).   Weight as of this encounter: 68 kg.   Code status:   Code Status: Full Code   DVT Prophylaxis:   Family Communication: Daughter-Lisa [phone] -called 6/27-left VM   Disposition Plan: Status is: Observation The patient will require care spanning > 2 midnights and should be moved to inpatient because: Severity of illness   Planned Discharge Destination:Home   Diet: Diet Order             Diet Heart Room service appropriate? Yes; Fluid consistency: Thin  Diet effective now                     Antimicrobial  agents: Anti-infectives (From admission, onward)    None        MEDICATIONS: Scheduled Meds:  amiodarone  200 mg Oral Daily   atorvastatin  40 mg Oral Daily   enoxaparin (LOVENOX) injection  40 mg Subcutaneous Q24H   furosemide  20 mg Oral Daily   montelukast  10 mg Oral QHS   pantoprazole  40 mg Oral Daily   potassium chloride SA  20 mEq Oral QHS   Continuous Infusions: PRN Meds:.acetaminophen **OR** acetaminophen, HYDROmorphone (DILAUDID) injection, OLANZapine, ondansetron **OR** ondansetron (ZOFRAN) IV,  polyethylene glycol   I have personally reviewed following labs and imaging studies  LABORATORY DATA: CBC: Recent Labs  Lab 02/10/23 0927 02/11/23 0557  WBC 15.0* 4.6  NEUTROABS 12.7*  --   HGB 10.6* 10.9*  HCT 32.4* 33.6*  MCV 99.4 99.1  PLT 258 229    Basic Metabolic Panel: Recent Labs  Lab 02/10/23 0927 02/11/23 0557  NA 138 139  K 3.9 3.7  CL 99 104  CO2 25 27  GLUCOSE 115* 96  BUN 16 11  CREATININE 0.98 0.89  CALCIUM 9.3 9.0    GFR: Estimated Creatinine Clearance: 44.9 mL/min (by C-G formula based on SCr of 0.89 mg/dL).  Liver Function Tests: Recent Labs  Lab 02/10/23 0927  AST 26  ALT 24  ALKPHOS 64  BILITOT 2.2*  PROT 6.7  ALBUMIN 3.3*   No results for input(s): "LIPASE", "AMYLASE" in the last 168 hours. No results for input(s): "AMMONIA" in the last 168 hours.  Coagulation Profile: No results for input(s): "INR", "PROTIME" in the last 168 hours.  Cardiac Enzymes: No results for input(s): "CKTOTAL", "CKMB", "CKMBINDEX", "TROPONINI" in the last 168 hours.  BNP (last 3 results) Recent Labs    08/26/22 0828  PROBNP 59.0    Lipid Profile: No results for input(s): "CHOL", "HDL", "LDLCALC", "TRIG", "CHOLHDL", "LDLDIRECT" in the last 72 hours.  Thyroid Function Tests: No results for input(s): "TSH", "T4TOTAL", "FREET4", "T3FREE", "THYROIDAB" in the last 72 hours.  Anemia Panel: No results for input(s): "VITAMINB12", "FOLATE", "FERRITIN", "TIBC", "IRON", "RETICCTPCT" in the last 72 hours.  Urine analysis:    Component Value Date/Time   COLORURINE YELLOW 02/11/2023 0513   APPEARANCEUR CLOUDY (A) 02/11/2023 0513   APPEARANCEUR Hazy 05/05/2012 0014   LABSPEC 1.010 02/11/2023 0513   LABSPEC 1.017 05/05/2012 0014   PHURINE 6.0 02/11/2023 0513   GLUCOSEU NEGATIVE 02/11/2023 0513   GLUCOSEU NEGATIVE 06/04/2021 1134   HGBUR SMALL (A) 02/11/2023 0513   BILIRUBINUR NEGATIVE 02/11/2023 0513   BILIRUBINUR neg 06/09/2021 1621   BILIRUBINUR  Negative 05/05/2012 0014   KETONESUR NEGATIVE 02/11/2023 0513   PROTEINUR NEGATIVE 02/11/2023 0513   UROBILINOGEN 0.2 06/09/2021 1621   UROBILINOGEN 0.2 06/04/2021 1134   NITRITE NEGATIVE 02/11/2023 0513   LEUKOCYTESUR LARGE (A) 02/11/2023 0513   LEUKOCYTESUR 3+ 05/05/2012 0014    Sepsis Labs: Lactic Acid, Venous    Component Value Date/Time   LATICACIDVEN 1.3 12/25/2022 1353    MICROBIOLOGY: No results found for this or any previous visit (from the past 240 hour(s)).  RADIOLOGY STUDIES/RESULTS: VAS [name] LOWER EXTREMITY VENOUS (DVT) (ONLY [facility] & [facility])  Result Date: 02/10/2023  Lower Venous DVT Study Patient Name:  LAVREN LEWAN  Date of Exam:   02/10/2023 Medical Rec #: [id number]            Accession #:    [id number] Date of Birth: [birth year]  Patient Gender: F Patient Age:   43 years Exam Location:  [facility] Procedure:      VAS [name] LOWER EXTREMITY VENOUS (DVT) Referring Phys: Benjiman Core --------------------------------------------------------------------------------  Indications: Leg pain, s/p fall.  Anticoagulation: Eliquis. Comparison Study: 05-16-2018 Prior left lower extremity was negative for DVT. Performing Technologist: Jean Rosenthal RDMS, RVT  Examination Guidelines: A complete evaluation includes B-mode imaging, spectral Doppler, color Doppler, and power Doppler as needed of all accessible portions of each vessel. Bilateral testing is considered an integral part of a complete examination. Limited examinations for reoccurring indications may be performed as noted. The reflux portion of the exam is performed with the patient in reverse Trendelenburg.  +---------+---------------+---------+-----------+----------+--------------+ RIGHT    CompressibilityPhasicitySpontaneityPropertiesThrombus Aging +---------+---------------+---------+-----------+----------+--------------+ CFV      Full           Yes      Yes                                  +---------+---------------+---------+-----------+----------+--------------+ SFJ      Full                                                        +---------+---------------+---------+-----------+----------+--------------+ FV Prox  Full                                                        +---------+---------------+---------+-----------+----------+--------------+ FV Mid   Full                                                        +---------+---------------+---------+-----------+----------+--------------+ FV DistalFull                                                        +---------+---------------+---------+-----------+----------+--------------+ PFV      Full                                                        +---------+---------------+---------+-----------+----------+--------------+ POP      Full           Yes      Yes                                 +---------+---------------+---------+-----------+----------+--------------+ PTV      Full                                                        +---------+---------------+---------+-----------+----------+--------------+  PERO     Full                                                        +---------+---------------+---------+-----------+----------+--------------+   +----+---------------+---------+-----------+----------+--------------+ LEFTCompressibilityPhasicitySpontaneityPropertiesThrombus Aging +----+---------------+---------+-----------+----------+--------------+ CFV Full           Yes      Yes                                 +----+---------------+---------+-----------+----------+--------------+     Summary: RIGHT: - There is no evidence of deep vein thrombosis in the lower extremity.  - No cystic structure found in the popliteal fossa.  LEFT: - No evidence of common femoral vein obstruction.  *See table(s) above for measurements and observations. Electronically signed by Coral Else MD  on 02/10/2023 at 8:36:24 PM.    Final    CT FEMUR RIGHT W CONTRAST  Result Date: 02/10/2023 CLINICAL DATA:  Right hip pain and bruising. EXAM: CT OF THE LOWER RIGHT EXTREMITY WITH CONTRAST TECHNIQUE: Multidetector CT imaging of the lower right extremity was performed according to the standard protocol following intravenous contrast administration. RADIATION DOSE REDUCTION: This exam was performed according to the departmental dose-optimization program which includes automated exposure control, adjustment of the mA and/or kV according to patient size and/or use of iterative reconstruction technique. CONTRAST:  75mL OMNIPAQUE IOHEXOL 350 MG/ML SOLN COMPARISON:  Pelvic x-rays from same day. FINDINGS: Bones/Joint/Cartilage No fracture or dislocation. Mild degenerative changes of the right hip and knee. No significant joint effusion. Ligaments Ligaments are suboptimally evaluated by CT. Muscles and Tendons Asymmetric isodense enlargement of the distal right iliopsoas muscle (series 4, image 50) as well as the right pectineus muscle (series 4, image 69). No contrast extravasation. Right gluteus minimus and medius muscle atrophy. Soft tissue Soft tissue swelling of the lateral and posterior distal thigh and knee. No fluid collection or hematoma. No soft tissue mass. Small fat containing right hernia. IMPRESSION: 1. Asymmetric isodense enlargement of the distal right iliopsoas muscle and right pectineus muscle, concerning for intramuscular hematomas. No evidence of active bleeding. 2. Soft tissue swelling of the lateral and posterior distal thigh and knee. No fluid collection or hematoma. 3. No acute osseous abnormality. Electronically Signed   By: Obie Dredge M.D.   On: 02/10/2023 12:28   CT HEAD WO CONTRAST ( )  Result Date: 02/10/2023 CLINICAL DATA:  Head trauma, moderate-severe; Neck trauma (Age >= 65y) EXAM: CT HEAD WITHOUT CONTRAST CT CERVICAL SPINE WITHOUT CONTRAST TECHNIQUE: Multidetector CT imaging of  the head and cervical spine was performed following the standard protocol without intravenous contrast. Multiplanar CT image reconstructions of the cervical spine were also generated. RADIATION DOSE REDUCTION: This exam was performed according to the departmental dose-optimization program which includes automated exposure control, adjustment of the mA and/or kV according to patient size and/or use of iterative reconstruction technique. COMPARISON:  Cervical spine radiograph 05/19/2017 FINDINGS: CT HEAD FINDINGS Limitations: Motion degraded exam. Brain: No evidence of hemorrhage, hydrocephalus, extra-axial collection or mass lesion/mass effect. Sequela of moderate chronic microvascular ischemic change. Generalized volume loss. There is a subtle region of loss gray-white differentiation right occipital lobe, which may be artifactual or represent a site of age indeterminate infarct. Vascular: No hyperdense vessel or unexpected calcification. Skull: Soft tissue  stranding in the periorbital soft tissues on the left with a small hematoma. No evidence of an underlying calvarial fracture. Sinuses/Orbits: Middle ear or mastoid effusion. Near-complete opacification of the right maxillary sinus with osseous changes suggestive of chronic right maxillary sinusitis. Bilateral lens replacement. Orbits are otherwise unremarkable. Other: None. CT CERVICAL SPINE FINDINGS Alignment: Trace retrolisthesis of C5 on C6, slightly progressed compared to 2018. Skull base and vertebrae: No acute fracture. No primary bone lesion or focal pathologic process. Soft tissues and spinal canal: No prevertebral fluid or swelling. No visible canal hematoma. Disc levels:  No evidence of high-grade spinal canal stenosis. Upper chest: Negative. Other: 0.5 cm hypodense left thyroid nodule, unchanged compared to 2021. IMPRESSION: 1. Soft tissue injury to the periorbital soft tissues on the left. No evidence of underlying calvarial fracture or intracranial  injury. 2. Subtle region of loss gray-white differentiation in the right occipital lobe may be artifactual in the setting of significant motion artifact or represent a site of and age indeterminate infarct. If there is clinical concern for acute infarct, consider further evaluation with MRI. 3. No acute cervical spine fracture. Electronically Signed   By: Lorenza Cambridge M.D.   On: 02/10/2023 10:14   CT Cervical Spine Wo Contrast  Result Date: 02/10/2023 CLINICAL DATA:  Head trauma, moderate-severe; Neck trauma (Age >= 65y) EXAM: CT HEAD WITHOUT CONTRAST CT CERVICAL SPINE WITHOUT CONTRAST TECHNIQUE: Multidetector CT imaging of the head and cervical spine was performed following the standard protocol without intravenous contrast. Multiplanar CT image reconstructions of the cervical spine were also generated. RADIATION DOSE REDUCTION: This exam was performed according to the departmental dose-optimization program which includes automated exposure control, adjustment of the mA and/or kV according to patient size and/or use of iterative reconstruction technique. COMPARISON:  Cervical spine radiograph 05/19/2017 FINDINGS: CT HEAD FINDINGS Limitations: Motion degraded exam. Brain: No evidence of hemorrhage, hydrocephalus, extra-axial collection or mass lesion/mass effect. Sequela of moderate chronic microvascular ischemic change. Generalized volume loss. There is a subtle region of loss gray-white differentiation right occipital lobe, which may be artifactual or represent a site of age indeterminate infarct. Vascular: No hyperdense vessel or unexpected calcification. Skull: Soft tissue stranding in the periorbital soft tissues on the left with a small hematoma. No evidence of an underlying calvarial fracture. Sinuses/Orbits: Middle ear or mastoid effusion. Near-complete opacification of the right maxillary sinus with osseous changes suggestive of chronic right maxillary sinusitis. Bilateral lens replacement. Orbits are  otherwise unremarkable. Other: None. CT CERVICAL SPINE FINDINGS Alignment: Trace retrolisthesis of C5 on C6, slightly progressed compared to 2018. Skull base and vertebrae: No acute fracture. No primary bone lesion or focal pathologic process. Soft tissues and spinal canal: No prevertebral fluid or swelling. No visible canal hematoma. Disc levels:  No evidence of high-grade spinal canal stenosis. Upper chest: Negative. Other: 0.5 cm hypodense left thyroid nodule, unchanged compared to 2021. IMPRESSION: 1. Soft tissue injury to the periorbital soft tissues on the left. No evidence of underlying calvarial fracture or intracranial injury. 2. Subtle region of loss gray-white differentiation in the right occipital lobe may be artifactual in the setting of significant motion artifact or represent a site of and age indeterminate infarct. If there is clinical concern for acute infarct, consider further evaluation with MRI. 3. No acute cervical spine fracture. Electronically Signed   By: Lorenza Cambridge M.D.   On: 02/10/2023 10:14   DG Pelvis Portable  Result Date: 02/10/2023 CLINICAL DATA:  Fall EXAM: PORTABLE PELVIS 1 VIEWS COMPARISON:  None Available. FINDINGS: No evidence of pelvic fracture or diastasis. Mild degenerative changes of the bilateral hips. Degenerative changes of the pubic symphysis and partially visualized lumbar spine. Diffuse demineralization. No pelvic bone lesions are seen. IMPRESSION: No evidence of displaced fracture. Diffuse demineralization limits evaluation for nondisplaced fracture, if there is continued clinical concern recommend further evaluation with cross-sectional imaging. Electronically Signed   By: Allegra Lai M.D.   On: 02/10/2023 09:23   DG Chest Portable 1 View  Result Date: 02/10/2023 CLINICAL DATA:  Status post fall. EXAM: PORTABLE CHEST 1 VIEW COMPARISON:  Dec 25, 2022 FINDINGS: Calcific atherosclerotic disease and tortuosity of the aorta. Cardiomediastinal silhouette is  normal. Mediastinal contours appear intact. There is no evidence of focal airspace consolidation, pleural effusion or pneumothorax. Chronic elevation of the left hemidiaphragm with large hiatal hernia. Osseous structures are without acute abnormality. Soft tissues are grossly normal. IMPRESSION: No active disease. Electronically Signed   By: Ted Mcalpine M.D.   On: 02/10/2023 09:19     LOS: 0 days   Jeoffrey Massed, MD  [facility]    To contact the attending provider between 7A-7P or the covering provider during after hours 7P-7A, please log into the web site [email or web address] and access using universal No Name password for that web site. If you do not have the password, please call the hospital operator.  02/11/2023, 10:13 AM

## 2023-02-11 NOTE — Progress Notes (Signed)
  Echocardiogram 2D Echocardiogram has been performed.  [name] 02/11/2023, 10:42 AM

## 2023-02-11 NOTE — Evaluation (Signed)
Occupational Therapy Evaluation Patient Details Name: Jill Snow MRN: [id number] DOB: January 25, 1934 Today's Date: 02/11/2023   History of Present Illness Pt is an 89 y.o. female presenting 6/26 after fall. Pt with R iliopsoas and pectineus mulcle enlargement concerning for hematomas. CT head with soft tissue periorbital injury on the L. PMH significant for Alzheimer's dementia, chronic HFpEF, HTN, PAF on Eliquis, HLD   Clinical Impression   PTA, pt stayed with her children, rotating from houses between daughters during the day and stayed with son at night. Pt previously ambulatory without AD, able to dress and toielt. Family assisting with IADL and bathing. More recent, use of depends for bowel/bladder management. Upon eval, pt ambulatory with and without RW with min A due to need for verbal cues for safety. Pt requiring cues for participation in all ADL despite ability to intermittently perform self directed ADL spontaneously. Daughter requesting short term inpatient rehabilitation despite discussion of whether natural environments would benefit this patient most as daughter with fear of pt falling. Patient will benefit from continued inpatient follow up therapy, <3 hours/day.      Recommendations for follow up therapy are one component of a multi-disciplinary discharge planning process, led by the attending physician.  Recommendations may be updated based on patient status, additional functional criteria and insurance authorization.   Assistance Recommended at Discharge Frequent or constant Supervision/Assistance  Patient can return home with the following A little help with walking and/or transfers;A lot of help with bathing/dressing/bathroom;Assistance with cooking/housework;Direct supervision/assist for medications management;Direct supervision/assist for financial management;Assist for transportation;Help with stairs or ramp for entrance    Functional Status Assessment  Patient has had  a recent decline in their functional status and demonstrates the ability to make significant improvements in function in a reasonable and predictable amount of time.  Equipment Recommendations  Other (comment) (defer)    Recommendations for Other Services       Precautions / Restrictions Precautions Precautions: Fall;Other (comment) Precaution Comments: dementia/delirium Restrictions Weight Bearing Restrictions: No      Mobility Bed Mobility Overal bed mobility: Needs Assistance Bed Mobility: Supine to Sit, Sit to Supine     Supine to sit: Min guard Sit to supine: Mod assist   General bed mobility comments: Mod A back to bed for cueing and guidance. Needing heavy cues to initiate return to supine.    Transfers Overall transfer level: Needs assistance Equipment used: Rolling walker (2 wheels), None Transfers: Sit to/from Stand Sit to Stand: Min guard, Min assist           General transfer comment: Min A for guidance due to poor cignision, however, able to rise without hands on assist      Balance Overall balance assessment: Needs assistance Sitting-balance support: No upper extremity supported, Feet supported Sitting balance-Leahy Scale: Good Sitting balance - Comments: able to doff socks EOB   Standing balance support: Single extremity supported, Bilateral upper extremity supported, During functional activity Standing balance-Leahy Scale: Poor                             ADL either performed or assessed with clinical judgement   ADL Overall ADL's : Needs assistance/impaired Eating/Feeding: Set up;Bed level   Grooming: Set up;Sitting;Wash/dry face Grooming Details (indicate cue type and reason): offered wash cloth EOB "here is a warm wash cloth for your face" and no further instruction, assist, or cueing needed Upper Body Bathing: Set up;Sitting   Lower Body  Bathing: Minimal assistance;Sit to/from stand;Cueing for sequencing Lower Body Bathing  Details (indicate cue type and reason): for cues Upper Body Dressing : Minimal assistance;Sitting Upper Body Dressing Details (indicate cue type and reason): for cues Lower Body Dressing: Minimal assistance;Sit to/from stand Lower Body Dressing Details (indicate cue type and reason): for cues. Able to doff cosks sitting EOB Toilet Transfer: Minimal assistance;Ambulation;Rolling walker (2 wheels)           Functional mobility during ADLs: Minimal assistance;Cueing for sequencing General ADL Comments: min A due to need for cues and guidance. Min guard physically with RW; min HHA without RW. Poor safety awareness, initially using RW, but then when directed to go sit back on bed, leaving walker outside room door     Vision Ability to See in Adequate Light: 0 Adequate Patient Visual Report: No change from baseline Additional Comments: WFL for tasks assessed. Recognizes obstaces, makes eye contact with therapist. Able to visually locate items and therapist without need to be in direct line of sight     Perception     Praxis      Pertinent Vitals/Pain Pain Assessment Pain Assessment: PAINAD Breathing: normal Negative Vocalization: none Facial Expression: smiling or inexpressive Body Language: relaxed Consolability: no need to console PAINAD Score: 0     Hand Dominance     Extremity/Trunk Assessment Upper Extremity Assessment Upper Extremity Assessment: Difficult to assess due to impaired cognition;Generalized weakness (Able to squyeeze/push/pull 4+/5, but more difficulty following commands to assess strength of shoulder musculature. ROM WFL)   Lower Extremity Assessment Lower Extremity Assessment: Defer to PT evaluation       Communication Communication Communication: No difficulties   Cognition Arousal/Alertness: Awake/alert Behavior During Therapy: WFL for tasks assessed/performed (fluctuating. Often preoccupied with family matters, whereabouts of children) Overall  Cognitive Status: History of cognitive impairments - at baseline                                 General Comments: Normally able to dress self and toilet. More recently having difficulty managing own bowel/bladder. Needs assist forn bathing and IADL at baseline. Pt naturally attempting to doff socks prior to return to bed. Following basic direct commands inconsistently. Frequently saying no when asked to perofrm tasks, but redirectable with time and additional verbal cues. Suspect cognition to be close to baseline for this pt     General Comments  Daughter entering at end of session and providing home set up. Family seems to have significant caregiver burden and fearful of pabilityb to care for pt and avoid falls    Exercises     Shoulder Instructions      Home Living Family/patient expects to be discharged to:: Private residence Living Arrangements: Children Available Help at Discharge: Family;Available 24 hours/day Type of Home: House Home Access: Stairs to enter [employer] of Steps: 3 flights is the most she has to manage Entrance Stairs-Rails: Right;Left;Can reach both       Bathroom Shower/Tub: Walk-in shower         Home Equipment: Rollator (4 wheels);BSC/3in1   Additional Comments: lives with son every night and rotates days with daughters, most steps she has to manage is 3 flights to her daughters apartment, as noted above. Takes shower at other daughters house with walk in shower [facility] available if she were to need shower seat.      Prior Functioning/Environment Prior Level of Function : Needs assist  Cognitive Assist : Mobility (cognitive);ADLs (cognitive) Mobility (Cognitive): Set up cues ADLs (Cognitive): Intermittent cues       Mobility Comments: Pt daughter, Jill Snow reports no assist needed for functional mobility within the home and navigating 3 flights of stairs into her home ADLs Comments: assist with shower for effectiveness and  safety. Can normally dress self and take self to restroom. More recently has needd intermittent use of depends for bowel/bladder management due to cognitive decline. Assist with all IADL.        OT Problem List: Decreased strength;Impaired balance (sitting and/or standing);Decreased activity tolerance;Decreased cognition;Decreased safety awareness;Decreased knowledge of precautions;Decreased knowledge of use of DME or AE      OT Treatment/Interventions: Self-care/ADL training;Therapeutic exercise;DME and/or AE instruction;Balance training;Patient/family education;Therapeutic activities    OT Goals(Current goals can be found in the care plan section) Acute Rehab OT Goals Patient Stated Goal: per daughter decrease fall risk OT Goal Formulation: With patient/family Time For Goal Achievement: 02/25/23 Potential to Achieve Goals: Fair  OT Frequency: Min 2X/week    Co-evaluation              AM-PAC OT "6 Clicks" Daily Activity     Outcome Measure Help from another person eating meals?: A Little Help from another person taking care of personal grooming?: A Little Help from another person toileting, which includes using toliet, bedpan, or urinal?: A Little Help from another person bathing (including washing, rinsing, drying)?: A Little Help from another person to put on and taking off regular upper body clothing?: A Little Help from another person to put on and taking off regular lower body clothing?: A Little 6 Click Score: 18   End of Session Equipment Utilized During Treatment: Rolling walker (2 wheels) Nurse Communication: Mobility status  Activity Tolerance: Patient tolerated treatment well Patient left: in bed;with call bell/phone within reach;with bed alarm set;with family/visitor present  OT Visit Diagnosis: Unsteadiness on feet (R26.81);Muscle weakness (generalized) (M62.81);Other symptoms and signs involving cognitive function;History of falling (Z91.81)                 Time: 1610-9604 OT Time Calculation (min): 32 min Charges:  OT General Charges $OT Visit: 1 Visit OT Evaluation $OT Eval Moderate Complexity: 1 Mod  Tyler Deis, OTR/L [facility] Acute Rehabilitation Office: [phone]   Myrla Halsted 02/11/2023, 9:42 AM

## 2023-02-11 NOTE — Telephone Encounter (Signed)
Noted  

## 2023-02-12 ENCOUNTER — Ambulatory Visit: Payer: Medicare HMO | Admitting: Family Medicine

## 2023-02-12 DIAGNOSIS — S301XXA Contusion of abdominal wall, initial encounter: Secondary | ICD-10-CM | POA: Diagnosis not present

## 2023-02-12 DIAGNOSIS — G9340 Encephalopathy, unspecified: Secondary | ICD-10-CM | POA: Diagnosis not present

## 2023-02-12 DIAGNOSIS — I480 Paroxysmal atrial fibrillation: Secondary | ICD-10-CM | POA: Diagnosis not present

## 2023-02-12 LAB — CBC
HCT: 32.9 % — ABNORMAL LOW (ref 36.0–46.0)
Hemoglobin: 10.7 g/dL — ABNORMAL LOW (ref 12.0–15.0)
MCH: 32.1 pg (ref 26.0–34.0)
MCHC: 32.5 g/dL (ref 30.0–36.0)
MCV: 98.8 fL (ref 80.0–100.0)
Platelets: 245 10*3/uL (ref 150–400)
RBC: 3.33 MIL/uL — ABNORMAL LOW (ref 3.87–5.11)
RDW: 12.6 % (ref 11.5–15.5)
WBC: 6.5 10*3/uL (ref 4.0–10.5)
nRBC: 0 % (ref 0.0–0.2)

## 2023-02-12 LAB — URINE CULTURE: Culture: >=100000 — AB

## 2023-02-12 MED ORDER — MELATONIN 5 MG PO TABS
10.0000 mg | ORAL_TABLET | Freq: Every day | ORAL | Status: DC
  Administered 2023-02-12 – 2023-02-13 (×2): 10 mg via ORAL
  Filled 2023-02-12 (×2): qty 2

## 2023-02-12 MED ORDER — QUETIAPINE FUMARATE 50 MG PO TABS
50.0000 mg | ORAL_TABLET | Freq: Every day | ORAL | Status: DC
  Administered 2023-02-12 – 2023-02-13 (×2): 50 mg via ORAL
  Filled 2023-02-12 (×2): qty 1

## 2023-02-12 NOTE — Progress Notes (Addendum)
PROGRESS NOTE        PATIENT DETAILS Name: Jill Snow Age: 89 y.o. Sex: female Date of Birth: 01/15/1934 Admit Date: 02/10/2023 Admitting Physician Emeline General, MD UKG:[id number], Yehuda Mao, MD  Brief Summary: Patient is a 89 y.o.  female with history of dementia, HFpEF, HTN, A-fib on Eliquis-who sustained a unwitnessed fall-following which she had right hip pain.  Upon further evaluation-she was found to have hematoma involving right iliopsoas muscle.  Significant events: 6/26>> admit to [facility]  Significant studies: 6/26>> x-ray pelvis: No fracture 6/26>> x-ray chest: No PNA 6/26>> CT head: No acute intracranial abnormality 6/26>> CT C-spine: No fracture 6/26>> CT right femur: Intramuscular hematomas involving distal right iliopsoas/right pectineus.  No evidence of active bleeding. 6/27>> echo: EF 60-65%, grade 2 diastolic dysfunction.  Significant microbiology data: 6/27>> urine culture: E Coli  Procedures: None  Consults: None  Subjective: Pleasantly confused-had delirium yesterday evening/last night.  Sitter at bedside.  Objective: Vitals: Blood pressure 128/63, pulse 88, temperature 97.9 F (36.6 C), temperature source Oral, resp. rate 18, height 5\' 8"  (1.727 m), weight 68 kg, SpO2 98 %.   Exam: Gen Exam:Alert awake-not in any distress HEENT:atraumatic, normocephalic Chest: B/L clear to auscultation anteriorly CVS:S1S2 regular Abdomen:soft non tender, non distended Extremities:no edema.  Ecchymosis/bruising-unchanged in the medial aspect of the right thigh. Neurology: Non focal Skin: no rash  Pertinent Labs/Radiology:    Latest Ref Rng & Units 02/12/2023    8:08 AM 02/11/2023    5:57 AM 02/10/2023    9:27 AM  CBC  WBC 4.0 - 10.5 K/uL 6.5  4.6  15.0   Hemoglobin 12.0 - 15.0 g/dL 62.8  31.5  17.6   Hematocrit 36.0 - 46.0 % 32.9  33.6  32.4   Platelets 150 - 400 K/uL 245  229  258     Lab Results  Component Value Date   NA  139 02/11/2023   K 3.7 02/11/2023   CL 104 02/11/2023   CO2 27 02/11/2023      Assessment/Plan: Fall Unwitnessed Unclear if this was a mechanical fall or perhaps a syncopal episode Telemetry without arrhythmias Echocardiogram with stable EF Ambulate with PT/OT-SNF recommended  Right iliopsoas/pectineus muscle hematoma with acute blood loss anemia Secondary to above-with Eliquis use Some pain with moving her legs-but with dementia-hard to assess degree of pain Supportive care CBC stable Continue to hold Eliquis for now Briefly discussed risk/benefits with daughter at bedside on 6/27-she will discuss with other family members-continue to hold Eliquis for now.  Probably could resume if she is going to a monitored setting like SNF.    Addendum-discussed with daughter Marylene Land at bedside 6/28 Family wishes to continue anticoagulation-apparently they have an extensive history of strokes in the family and do not want this patient to have a stroke off Eliquis.  They understand the catastrophic risk of severe falls-leading to life-threatening/life disabling effects-and accepting these risks.  Will hold Eliquis for today and if CBC is stable-potentially could resume it tomorrow.  We also talked about her issues with delirium yesterday requiring IV Haldol/Zyprexa-and that she will continue to be confused/agitated as long as she is outside of her family surroundings.  If family can  to provide 24/7 care at home-it may be more feasible/practical to take her home.  Per daughter-patient has sundowners at home as well-and keeps asking for her  son to be around during that time.  In any event-Angela will talk with the rest of her siblings and let us know about disposition.    PAF Telemetry monitoring Amiodarone Eliquis on hold-see above  History of dementia with delirium Developed severe delirium last evening requiring IV Haldol/Zyprexa and restraints. Subsequently started on  Seroquel/melatonin- Remains pleasantly confused this morning. Sitter in place-RN to discontinue restraints today.  HLD Statin  BMI: Estimated body mass index is 22.81 kg/m as calculated from the following:   Height as of this encounter: 5\' 8"  (1.727 m).   Weight as of this encounter: 68 kg.   Code status:   Code Status: Full Code   DVT Prophylaxis:   Family Communication:  Daughter Angela-at bedside on 6/28.  See above. Daughter-Lisa [phone] -called 6/27-left VM, spoke with one of the other daughter at bedside yesterday.   Disposition Plan: Status is: Observation The patient will require care spanning > 2 midnights and should be moved to inpatient because: Severity of illness   Planned Discharge Destination:Home   Diet: Diet Order             Diet Heart Room service appropriate? Yes; Fluid consistency: Thin  Diet effective now                     Antimicrobial agents: Anti-infectives (From admission, onward)    None        MEDICATIONS: Scheduled Meds:  amiodarone  200 mg Oral Daily   atorvastatin  40 mg Oral Daily   furosemide  20 mg Oral Daily   melatonin  5 mg Oral QHS   montelukast  10 mg Oral QHS   pantoprazole  40 mg Oral Daily   potassium chloride SA  20 mEq Oral QHS   QUEtiapine  50 mg Oral QHS   Continuous Infusions: PRN Meds:.acetaminophen **OR** acetaminophen, haloperidol lactate, HYDROmorphone (DILAUDID) injection, ondansetron **OR** ondansetron (ZOFRAN) IV, polyethylene glycol   I have personally reviewed following labs and imaging studies  LABORATORY DATA: CBC: Recent Labs  Lab 02/10/23 0927 02/11/23 0557 02/12/23 0808  WBC 15.0* 4.6 6.5  NEUTROABS 12.7*  --   --   HGB 10.6* 10.9* 10.7*  HCT 32.4* 33.6* 32.9*  MCV 99.4 99.1 98.8  PLT 258 229 245     Basic Metabolic Panel: Recent Labs  Lab 02/10/23 0927 02/11/23 0557  NA 138 139  K 3.9 3.7  CL 99 104  CO2 25 27  GLUCOSE 115* 96  BUN 16 11  CREATININE  0.98 0.89  CALCIUM 9.3 9.0     GFR: Estimated Creatinine Clearance: 44.9 mL/min (by C-G formula based on SCr of 0.89 mg/dL).  Liver Function Tests: Recent Labs  Lab 02/10/23 0927  AST 26  ALT 24  ALKPHOS 64  BILITOT 2.2*  PROT 6.7  ALBUMIN 3.3*    No results for input(s): "LIPASE", "AMYLASE" in the last 168 hours. No results for input(s): "AMMONIA" in the last 168 hours.  Coagulation Profile: No results for input(s): "INR", "PROTIME" in the last 168 hours.  Cardiac Enzymes: No results for input(s): "CKTOTAL", "CKMB", "CKMBINDEX", "TROPONINI" in the last 168 hours.  BNP (last 3 results) Recent Labs    08/26/22 0828  PROBNP 59.0     Lipid Profile: No results for input(s): "CHOL", "HDL", "LDLCALC", "TRIG", "CHOLHDL", "LDLDIRECT" in the last 72 hours.  Thyroid Function Tests: No results for input(s): "TSH", "T4TOTAL", "FREET4", "T3FREE", "THYROIDAB" in the last 72 hours.  Anemia Panel: No  results for input(s): "VITAMINB12", "FOLATE", "FERRITIN", "TIBC", "IRON", "RETICCTPCT" in the last 72 hours.  Urine analysis:    Component Value Date/Time   COLORURINE YELLOW 02/11/2023 0513   APPEARANCEUR CLOUDY (A) 02/11/2023 0513   APPEARANCEUR Hazy 05/05/2012 0014   LABSPEC 1.010 02/11/2023 0513   LABSPEC 1.017 05/05/2012 0014   PHURINE 6.0 02/11/2023 0513   GLUCOSEU NEGATIVE 02/11/2023 0513   GLUCOSEU NEGATIVE 06/04/2021 1134   HGBUR SMALL (A) 02/11/2023 0513   BILIRUBINUR NEGATIVE 02/11/2023 0513   BILIRUBINUR neg 06/09/2021 1621   BILIRUBINUR Negative 05/05/2012 0014   KETONESUR NEGATIVE 02/11/2023 0513   PROTEINUR NEGATIVE 02/11/2023 0513   UROBILINOGEN 0.2 06/09/2021 1621   UROBILINOGEN 0.2 06/04/2021 1134   NITRITE NEGATIVE 02/11/2023 0513   LEUKOCYTESUR LARGE (A) 02/11/2023 0513   LEUKOCYTESUR 3+ 05/05/2012 0014    Sepsis Labs: Lactic Acid, Venous    Component Value Date/Time   LATICACIDVEN 1.3 12/25/2022 1353    MICROBIOLOGY: Recent Results (from  the past 240 hour(s))  Urine Culture     Status: Abnormal (Preliminary result)   Collection Time: 02/11/23  5:13 AM   Specimen: Urine, Random  Result Value Ref Range Status   Specimen Description URINE, RANDOM  Final   Special Requests NONE Reflexed from [id number]  Final   Culture (A)  Final    >=100,000 COLONIES/mL ESCHERICHIA COLI SUSCEPTIBILITIES TO FOLLOW Performed at [facility], [address] [place], [place] [address]    Report Status PENDING  Incomplete    RADIOLOGY STUDIES/RESULTS: ECHOCARDIOGRAM COMPLETE  Result Date: 02/11/2023    ECHOCARDIOGRAM REPORT   Patient Name:   Jill Snow Spratt Date of Exam: 02/11/2023 Medical Rec #:  [id number]           Height:       68.0 in Accession #:    [id number]          Weight:       150.0 lb Date of Birth:  01/10/1934            BSA:          1.809 m Patient Age:    89 years            BP:           113/71 mmHg Patient Gender: F                   HR:           72 bpm. Exam Location:  Inpatient Procedure: 2D Echo, Cardiac Doppler and Color Doppler Indications:    CHF-Acute Diastolic I50.31  History:        Patient has prior history of Echocardiogram examinations, most                 recent 03/10/2021. CHF, Mitral Valve Disease, Arrythmias:Atrial                 Fibrillation and RBBB, Signs/Symptoms:Altered Mental Status;                 Risk Factors:Dyslipidemia, Non-Smoker, Hypertension and Sleep                 Apnea.  Sonographer:    Aron Baba Referring Phys: [id number] Emeline General  Sonographer Comments: No subcostal window. IMPRESSIONS  1. Left ventricular ejection fraction, by estimation, is 60 to 65%. The left ventricle has normal function. The left ventricle has no regional wall motion abnormalities. Left ventricular diastolic parameters are consistent  with Grade II diastolic dysfunction (pseudonormalization).  2. Right ventricular systolic function is normal. The right ventricular size is normal. Tricuspid regurgitation signal is  inadequate for assessing PA pressure.  3. Left atrial size was moderately dilated.  4. The mitral valve is degenerative. Mild to moderate mitral valve regurgitation.  5. The aortic valve is tricuspid. Aortic valve regurgitation is not visualized. Aortic valve sclerosis/calcification is present, without any evidence of aortic stenosis. Comparison(s): No significant change from prior study. FINDINGS  Left Ventricle: Left ventricular ejection fraction, by estimation, is 60 to 65%. The left ventricle has normal function. The left ventricle has no regional wall motion abnormalities. The left ventricular internal cavity size was normal in size. There is  no left ventricular hypertrophy. Left ventricular diastolic parameters are consistent with Grade II diastolic dysfunction (pseudonormalization). Right Ventricle: The right ventricular size is normal. No increase in right ventricular wall thickness. Right ventricular systolic function is normal. Tricuspid regurgitation signal is inadequate for assessing PA pressure. Left Atrium: Left atrial size was moderately dilated. Right Atrium: Right atrial size was normal in size. Pericardium: There is no evidence of pericardial effusion. Mitral Valve: The mitral valve is degenerative in appearance. There is mild thickening of the mitral valve leaflet(s). There is mild calcification of the mitral valve leaflet(s). Mild to moderate mitral valve regurgitation. MV peak gradient, 4.7 mmHg. The mean mitral valve gradient is 2.0 mmHg. Tricuspid Valve: The tricuspid valve is normal in structure. Tricuspid valve regurgitation is trivial. Aortic Valve: The aortic valve is tricuspid. Aortic valve regurgitation is not visualized. Aortic valve sclerosis/calcification is present, without any evidence of aortic stenosis. Pulmonic Valve: The pulmonic valve was normal in structure. Pulmonic valve regurgitation is trivial. Aorta: The aortic root is normal in size and structure. IAS/Shunts: The atrial  septum is grossly normal.  LEFT VENTRICLE PLAX 2D LVIDd:         3.60 cm   Diastology LVIDs:         2.50 cm   LV e' medial:    6.60 cm/s LV PW:         0.60 cm   LV E/e' medial:  15.3 LV IVS:        0.60 cm   LV e' lateral:   7.58 cm/s LVOT diam:     1.70 cm   LV E/e' lateral: 13.3 LV SV:         42 LV SV Index:   23 LVOT Area:     2.27 cm  RIGHT VENTRICLE RV S prime:     10.10 cm/s TAPSE (M-mode): 2.4 cm LEFT ATRIUM           Index        RIGHT ATRIUM           Index LA diam:      4.00 cm 2.21 cm/m   RA Area:     11.00 cm LA Vol (A2C): 60.1 ml 33.23 ml/m  RA Volume:   20.60 ml  11.39 ml/m LA Vol (A4C): 75.5 ml 41.75 ml/m  AORTIC VALVE LVOT Vmax:   85.50 cm/s LVOT Vmean:  56.050 cm/s LVOT VTI:    0.186 m  AORTA Ao Root diam: 3.20 cm Ao Asc diam:  3.60 cm MITRAL VALVE MV Area (PHT): 2.99 cm      SHUNTS MV Area VTI:   1.30 cm      Systemic VTI:  0.19 m MV Peak grad:  4.7 mmHg      Systemic Diam:  1.70 cm MV Mean grad:  2.0 mmHg MV Vmax:       1.08 m/s MV Vmean:      63.5 cm/s MV Decel Time: 254 msec MR Peak grad:   120.6 mmHg MR Mean grad:   73.0 mmHg MR Vmax:        549.00 cm/s MR Vmean:       402.0 cm/s MR PISA:        0.57 cm MR PISA Radius: 0.30 cm MV E velocity: 101.00 cm/s MV A velocity: 54.80 cm/s MV E/A ratio:  1.84 Laurance Flatten MD Electronically signed by Laurance Flatten MD Signature Date/Time: 02/11/2023/1:02:43 PM    Final    VAS [name] LOWER EXTREMITY VENOUS (DVT) (ONLY [facility] & [facility])  Result Date: 02/10/2023  Lower Venous DVT Study Patient Name:  DAMIRA ARDINGER  Date of Exam:   02/10/2023 Medical Rec #: [id number]            Accession #:    [id number] Date of Birth: August 02, 1934             Patient Gender: F Patient Age:   30 years Exam Location:  [facility] Procedure:      VAS [name] LOWER EXTREMITY VENOUS (DVT) Referring Phys: Benjiman Core --------------------------------------------------------------------------------  Indications: Leg pain, s/p fall.  Anticoagulation: Eliquis.  Comparison Study: 05-16-2018 Prior left lower extremity was negative for DVT. Performing Technologist: Jean Rosenthal RDMS, RVT  Examination Guidelines: A complete evaluation includes B-mode imaging, spectral Doppler, color Doppler, and power Doppler as needed of all accessible portions of each vessel. Bilateral testing is considered an integral part of a complete examination. Limited examinations for reoccurring indications may be performed as noted. The reflux portion of the exam is performed with the patient in reverse Trendelenburg.  +---------+---------------+---------+-----------+----------+--------------+ RIGHT    CompressibilityPhasicitySpontaneityPropertiesThrombus Aging +---------+---------------+---------+-----------+----------+--------------+ CFV      Full           Yes      Yes                                 +---------+---------------+---------+-----------+----------+--------------+ SFJ      Full                                                        +---------+---------------+---------+-----------+----------+--------------+ FV Prox  Full                                                        +---------+---------------+---------+-----------+----------+--------------+ FV Mid   Full                                                        +---------+---------------+---------+-----------+----------+--------------+ FV DistalFull                                                        +---------+---------------+---------+-----------+----------+--------------+  PFV      Full                                                        +---------+---------------+---------+-----------+----------+--------------+ POP      Full           Yes      Yes                                 +---------+---------------+---------+-----------+----------+--------------+ PTV      Full                                                         +---------+---------------+---------+-----------+----------+--------------+ PERO     Full                                                        +---------+---------------+---------+-----------+----------+--------------+   +----+---------------+---------+-----------+----------+--------------+ LEFTCompressibilityPhasicitySpontaneityPropertiesThrombus Aging +----+---------------+---------+-----------+----------+--------------+ CFV Full           Yes      Yes                                 +----+---------------+---------+-----------+----------+--------------+     Summary: RIGHT: - There is no evidence of deep vein thrombosis in the lower extremity.  - No cystic structure found in the popliteal fossa.  LEFT: - No evidence of common femoral vein obstruction.  *See table(s) above for measurements and observations. Electronically signed by Coral Else MD on 02/10/2023 at 8:36:24 PM.    Final    CT FEMUR RIGHT W CONTRAST  Result Date: 02/10/2023 CLINICAL DATA:  Right hip pain and bruising. EXAM: CT OF THE LOWER RIGHT EXTREMITY WITH CONTRAST TECHNIQUE: Multidetector CT imaging of the lower right extremity was performed according to the standard protocol following intravenous contrast administration. RADIATION DOSE REDUCTION: This exam was performed according to the departmental dose-optimization program which includes automated exposure control, adjustment of the mA and/or kV according to patient size and/or use of iterative reconstruction technique. CONTRAST:  75mL OMNIPAQUE IOHEXOL 350 MG/ML SOLN COMPARISON:  Pelvic x-rays from same day. FINDINGS: Bones/Joint/Cartilage No fracture or dislocation. Mild degenerative changes of the right hip and knee. No significant joint effusion. Ligaments Ligaments are suboptimally evaluated by CT. Muscles and Tendons Asymmetric isodense enlargement of the distal right iliopsoas muscle (series 4, image 50) as well as the right pectineus muscle (series 4, image  69). No contrast extravasation. Right gluteus minimus and medius muscle atrophy. Soft tissue Soft tissue swelling of the lateral and posterior distal thigh and knee. No fluid collection or hematoma. No soft tissue mass. Small fat containing right hernia. IMPRESSION: 1. Asymmetric isodense enlargement of the distal right iliopsoas muscle and right pectineus muscle, concerning for intramuscular hematomas. No evidence of active bleeding. 2. Soft tissue swelling of the lateral and posterior distal thigh and knee. No fluid collection or hematoma. 3. No  acute osseous abnormality. Electronically Signed   By: Obie Dredge M.D.   On: 02/10/2023 12:28     LOS: 0 days   Jeoffrey Massed, MD  [facility]    To contact the attending provider between 7A-7P or the covering provider during after hours 7P-7A, please log into the web site [email or web address] and access using universal [place] password for that web site. If you do not have the password, please call the hospital operator.  02/12/2023, 10:50 AM

## 2023-02-12 NOTE — Plan of Care (Signed)
  Problem: Health Behavior/Discharge Planning: Goal: Ability to manage health-related needs will improve Outcome: Progressing   

## 2023-02-12 NOTE — Progress Notes (Signed)
Cardiac Telemetry monitoring discontinued per conversation with MD Ghimire.  Cardiac monitoring was placed on patient 6/27 due to administration of Haldol.   QTc was checked at that time and within normal limits per MD.  MD advised okay to remove cardiac monitoring.

## 2023-02-13 DIAGNOSIS — D62 Acute posthemorrhagic anemia: Secondary | ICD-10-CM | POA: Diagnosis present

## 2023-02-13 DIAGNOSIS — S301XXA Contusion of abdominal wall, initial encounter: Secondary | ICD-10-CM | POA: Diagnosis not present

## 2023-02-13 DIAGNOSIS — Z7901 Long term (current) use of anticoagulants: Secondary | ICD-10-CM | POA: Diagnosis not present

## 2023-02-13 DIAGNOSIS — S7001XA Contusion of right hip, initial encounter: Secondary | ICD-10-CM | POA: Diagnosis present

## 2023-02-13 DIAGNOSIS — T45515A Adverse effect of anticoagulants, initial encounter: Secondary | ICD-10-CM | POA: Diagnosis present

## 2023-02-13 DIAGNOSIS — M7981 Nontraumatic hematoma of soft tissue: Secondary | ICD-10-CM | POA: Diagnosis present

## 2023-02-13 DIAGNOSIS — Y92019 Unspecified place in single-family (private) house as the place of occurrence of the external cause: Secondary | ICD-10-CM | POA: Diagnosis not present

## 2023-02-13 DIAGNOSIS — Z8261 Family history of arthritis: Secondary | ICD-10-CM | POA: Diagnosis not present

## 2023-02-13 DIAGNOSIS — Z823 Family history of stroke: Secondary | ICD-10-CM | POA: Diagnosis not present

## 2023-02-13 DIAGNOSIS — I480 Paroxysmal atrial fibrillation: Secondary | ICD-10-CM | POA: Diagnosis present

## 2023-02-13 DIAGNOSIS — Z1629 Resistance to other single specified antibiotic: Secondary | ICD-10-CM | POA: Diagnosis present

## 2023-02-13 DIAGNOSIS — N390 Urinary tract infection, site not specified: Secondary | ICD-10-CM | POA: Diagnosis present

## 2023-02-13 DIAGNOSIS — F05 Delirium due to known physiological condition: Secondary | ICD-10-CM | POA: Diagnosis present

## 2023-02-13 DIAGNOSIS — F02C0 Dementia in other diseases classified elsewhere, severe, without behavioral disturbance, psychotic disturbance, mood disturbance, and anxiety: Secondary | ICD-10-CM | POA: Diagnosis present

## 2023-02-13 DIAGNOSIS — I051 Rheumatic mitral insufficiency: Secondary | ICD-10-CM | POA: Diagnosis present

## 2023-02-13 DIAGNOSIS — Z833 Family history of diabetes mellitus: Secondary | ICD-10-CM | POA: Diagnosis not present

## 2023-02-13 DIAGNOSIS — G9340 Encephalopathy, unspecified: Secondary | ICD-10-CM | POA: Diagnosis not present

## 2023-02-13 DIAGNOSIS — E7800 Pure hypercholesterolemia, unspecified: Secondary | ICD-10-CM | POA: Diagnosis present

## 2023-02-13 DIAGNOSIS — Z1623 Resistance to quinolones and fluoroquinolones: Secondary | ICD-10-CM | POA: Diagnosis present

## 2023-02-13 DIAGNOSIS — I110 Hypertensive heart disease with heart failure: Secondary | ICD-10-CM | POA: Diagnosis present

## 2023-02-13 DIAGNOSIS — I5032 Chronic diastolic (congestive) heart failure: Secondary | ICD-10-CM | POA: Diagnosis present

## 2023-02-13 DIAGNOSIS — G309 Alzheimer's disease, unspecified: Secondary | ICD-10-CM | POA: Diagnosis present

## 2023-02-13 DIAGNOSIS — Z8249 Family history of ischemic heart disease and other diseases of the circulatory system: Secondary | ICD-10-CM | POA: Diagnosis not present

## 2023-02-13 DIAGNOSIS — D6832 Hemorrhagic disorder due to extrinsic circulating anticoagulants: Secondary | ICD-10-CM | POA: Diagnosis present

## 2023-02-13 DIAGNOSIS — Z8349 Family history of other endocrine, nutritional and metabolic diseases: Secondary | ICD-10-CM | POA: Diagnosis not present

## 2023-02-13 DIAGNOSIS — Z90710 Acquired absence of both cervix and uterus: Secondary | ICD-10-CM | POA: Diagnosis not present

## 2023-02-13 DIAGNOSIS — W1830XA Fall on same level, unspecified, initial encounter: Secondary | ICD-10-CM | POA: Diagnosis present

## 2023-02-13 DIAGNOSIS — B9620 Unspecified Escherichia coli [E. coli] as the cause of diseases classified elsewhere: Secondary | ICD-10-CM | POA: Diagnosis present

## 2023-02-13 LAB — CBC
HCT: 32.4 % — ABNORMAL LOW (ref 36.0–46.0)
Hemoglobin: 10.4 g/dL — ABNORMAL LOW (ref 12.0–15.0)
MCH: 31.9 pg (ref 26.0–34.0)
MCHC: 32.1 g/dL (ref 30.0–36.0)
MCV: 99.4 fL (ref 80.0–100.0)
Platelets: 232 10*3/uL (ref 150–400)
RBC: 3.26 MIL/uL — ABNORMAL LOW (ref 3.87–5.11)
RDW: 12.8 % (ref 11.5–15.5)
WBC: 5.1 10*3/uL (ref 4.0–10.5)
nRBC: 0 % (ref 0.0–0.2)

## 2023-02-13 LAB — URINE CULTURE

## 2023-02-13 MED ORDER — APIXABAN 5 MG PO TABS
5.0000 mg | ORAL_TABLET | Freq: Two times a day (BID) | ORAL | Status: DC
  Administered 2023-02-13 – 2023-02-14 (×3): 5 mg via ORAL
  Filled 2023-02-13 (×3): qty 1

## 2023-02-13 NOTE — Progress Notes (Signed)
Patient daughter in room requesting to speak with physician.  Dr. Jerral Ralph is going to call daughter, [name] at [phone].  Sitter at bedside and pt sitting in chair eating her lunch.

## 2023-02-13 NOTE — Progress Notes (Signed)
ANTICOAGULATION CONSULT NOTE - Initial Consult  Pharmacy Consult for apixaban Indication: atrial fibrillation  Allergies  Allergen Reactions   Augmentin [Amoxicillin-Pot Clavulanate] Nausea And Vomiting   Codeine Anaphylaxis   Crestor [Rosuvastatin] Other (See Comments)    Myalgias     Penicillins Other (See Comments)    Unknown reaction Tolerates cephalosporins     Patient Measurements: Height: 5\' 8"  (172.7 cm) Weight: 68 kg (150 lb) IBW/kg (Calculated) : 63.9 Heparin Dosing Weight:   Vital Signs: Temp: 97.5 F (36.4 C) (06/29 0514) Temp Source: Axillary (06/29 0514) BP: 102/58 (06/28 2305) Pulse Rate: 77 (06/28 2310)  Labs: Recent Labs    02/10/23 0927 02/11/23 0557 02/12/23 0808 02/13/23 0317  HGB 10.6* 10.9* 10.7* 10.4*  HCT 32.4* 33.6* 32.9* 32.4*  PLT 258 229 245 232  CREATININE 0.98 0.89  --   --     Estimated Creatinine Clearance: 44.9 mL/min (by C-G formula based on SCr of 0.89 mg/dL).   Medical History: Past Medical History:  Diagnosis Date   Atrial flutter (HCC)    a. s/p successful TEE/DCCV in 2014; b. TEE 2014 showed EF 45-50%, mild bi-atrial enlargement, mod MR   Dyspnea    History of chicken pox    Hypercholesterolemia    Hypertension    Mitral regurgitation    a. echo 2014: EF 40-45%, mildly dilated RV, mod reduced RV systolic fxn, mod dilated LA, Mild to mod MR, mod TR, mildly elevated PASP   PAF (paroxysmal atrial fibrillation) (HCC)    a. initial episode 2011; b. not on long term anticoagulation since 06/2013   RBBB (right bundle branch block)    Seasonal allergies    Sleep apnea    a. on CPAP   Thyroid nodule 05/19/2017    Medications:  Medications Prior to Admission  Medication Sig Dispense Refill Last Dose   acetaminophen (TYLENOL) 650 MG CR tablet Take 1,300 mg by mouth every 8 (eight) hours as needed for pain.   02/09/2023   amiodarone (PACERONE) 200 MG tablet Take 1 tablet (200 mg total) by mouth daily. 90 tablet 3 02/09/2023    atorvastatin (LIPITOR) 40 MG tablet Take 1 tablet (40 mg total) by mouth daily. 90 tablet 1 02/09/2023   Cholecalciferol (VITAMIN D-3 PO) Take 1 capsule by mouth at bedtime.   02/09/2023   ELIQUIS 5 MG TABS tablet TAKE 1 TABLET BY MOUTH TWICE A DAY (Patient taking differently: Take 5 mg by mouth in the morning and at bedtime.) 60 tablet 5 02/09/2023 at 9 pm   furosemide (LASIX) 20 MG tablet TAKE 1 TABLET DAILY. MAY TAKE 1 EXTRA TABLET DAILY AS NEEDED FOR WEIGHT GAIN OF 2 POUNDS OVERNIGHT OR 5 POUNDS IN 1 WEEK (Patient taking differently: Take 20 mg by mouth daily.) 90 tablet 3 02/09/2023   ibuprofen (ADVIL) 200 MG tablet Take 600 mg by mouth every 6 (six) hours as needed for moderate pain.   02/09/2023   montelukast (SINGULAIR) 10 MG tablet Take 1 tablet (10 mg total) by mouth daily. (Patient taking differently: Take 10 mg by mouth at bedtime.) 90 tablet 1 02/09/2023   pantoprazole (PROTONIX) 40 MG tablet Take 1 tablet (40 mg total) by mouth daily. 30 tablet 0 02/09/2023   potassium chloride SA (KLOR-CON M20) 20 MEQ tablet TAKE 1 TABLET BY MOUTH EVERY DAY (Patient taking differently: Take 20 mEq by mouth at bedtime.) 90 tablet 2 02/09/2023   VITAMIN E PO Take 1 capsule by mouth daily.   02/09/2023  polyethylene glycol (MIRALAX / GLYCOLAX) 17 g packet Take 17 g by mouth daily as needed for mild constipation. (Patient not taking: Reported on 02/10/2023) 14 each 0 Not Taking   Scheduled:   amiodarone  200 mg Oral Daily   apixaban  5 mg Oral BID   atorvastatin  40 mg Oral Daily   furosemide  20 mg Oral Daily   melatonin  10 mg Oral QHS   montelukast  10 mg Oral QHS   pantoprazole  40 mg Oral Daily   potassium chloride SA  20 mEq Oral QHS   QUEtiapine  50 mg Oral QHS   Infusions:   Assessment: Pt admitted for a fall without fracture. Apixaban will be resumed today. Age>80, wt>60kg, scr<1.5  Hgb 10s, plt wnl  Goal of Therapy:  Monitor platelets by anticoagulation protocol: Yes   Plan:  Resume  apixaban 5mg  BID Rx will follow peripherally  Ulyses Southward, PharmD, BCIDP, AAHIVP, CPP Infectious Disease Pharmacist 02/13/2023 8:10 AM

## 2023-02-13 NOTE — Progress Notes (Signed)
Physical Therapy Treatment Patient Details Name: Jill Snow MRN: [id number] DOB: 28-Jan-1934 Today's Date: 02/13/2023   History of Present Illness Pt is an 89 y.o. female presenting 6/26 after fall. Pt with R iliopsoas and pectineus mulcle enlargement concerning for hematomas. CT head with soft tissue periorbital injury on the L. PMH significant for Alzheimer's dementia, chronic HFpEF, HTN, PAF on Eliquis, HLD    PT Comments    Pt tolerated today's session well, daughter and sitter present throughout session, pt with improved command following and pleasant throughout. Pt provided RW and able to use it for hallway ambulation, maintaining RW until returning to sitting. Mild cues provided for RW management around obstacles and forward gaze to anticipate obstacles, minG provided throughout for safety. Discussed plans with daughter, plan is now for pt to return home with HHPT, recommendations updated, agree with recommendations as pt is able to have full time care and will likely do better in a familiar environment. Recommend RW and shower chair as well based on needs discussed with the family. Acute PT will continue to follow up with pt as appropriate.     Recommendations for follow up therapy are one component of a multi-disciplinary discharge planning process, led by the attending physician.  Recommendations may be updated based on patient status, additional functional criteria and insurance authorization.  Follow Up Recommendations  Can patient physically be transported by private vehicle: Yes    Assistance Recommended at Discharge Frequent or constant Supervision/Assistance  Patient can return home with the following A little help with walking and/or transfers;A little help with bathing/dressing/bathroom;Assistance with cooking/housework;Direct supervision/assist for medications management;Direct supervision/assist for financial management;Assist for transportation;Help with stairs or ramp  for entrance   Equipment Recommendations  Rolling walker (2 wheels);Other (comment) (shower chair)    Recommendations for Other Services       Precautions / Restrictions Precautions Precautions: Fall;Other (comment) Precaution Comments: dementia/delirium Restrictions Weight Bearing Restrictions: No     Mobility  Bed Mobility Overal bed mobility: Needs Assistance             General bed mobility comments: pt in chair upon arrival, ended session with pt in chair    Transfers Overall transfer level: Needs assistance Equipment used: Rolling walker (2 wheels), None Transfers: Sit to/from Stand Sit to Stand: Min guard           General transfer comment: close guard for balance and safety with standing, performed 2 trials from chair, one with RW and one with no AD, cued for proper hand placement with RW trial    Ambulation/Gait Ambulation/Gait assistance: Min guard, Min assist Gait Distance (Feet): 150 Feet Assistive device: Rolling walker (2 wheels) Gait Pattern/deviations: Step-through pattern, Decreased stance time - right, Decreased dorsiflexion - right, Antalgic, Drifts right/left Gait velocity: mildly decreased     General Gait Details: mildly antalgic gait on RLE, minG for safety and balance with intermittent minA for RW management around obstacles, cueing for forward gaze to anticipate obstacles   Stairs             Wheelchair Mobility    Modified Rankin (Stroke Patients Only)       Balance Overall balance assessment: Needs assistance Sitting-balance support: No upper extremity supported, Feet supported Sitting balance-Leahy Scale: Good     Standing balance support: Bilateral upper extremity supported, During functional activity, Reliant on assistive device for balance Standing balance-Leahy Scale: Fair Standing balance comment: able to stand statically without AD, RW utilized for ambulation  Cognition Arousal/Alertness: Awake/alert Behavior During Therapy: WFL for tasks assessed/performed, Restless Overall Cognitive Status: History of cognitive impairments - at baseline                                 General Comments: pt pleasant during today's session, following commands well        Exercises      General Comments General comments (skin integrity, edema, etc.): bruising remains on face and RLE, daughter present throughout session and sitter at bedside      Pertinent Vitals/Pain Pain Assessment Pain Assessment: Faces Faces Pain Scale: Hurts a little bit Pain Location: eye Pain Descriptors / Indicators: Aching, Discomfort Pain Intervention(s): RN gave pain meds during session, Limited activity within patient's tolerance, Monitored during session, Repositioned    Home Living                          Prior Function            PT Goals (current goals can now be found in the care plan section) Acute Rehab PT Goals Patient Stated Goal: daughter reports the goal is to progress pt to not being a fall risk PT Goal Formulation: With patient/family Time For Goal Achievement: 02/25/23 Potential to Achieve Goals: Fair Progress towards PT goals: Progressing toward goals    Frequency    Min 3X/week      PT Plan Discharge plan needs to be updated;Frequency needs to be updated    Co-evaluation              AM-PAC PT "6 Clicks" Mobility   Outcome Measure  Help needed turning from your back to your side while in a flat bed without using bedrails?: A Little Help needed moving from lying on your back to sitting on the side of a flat bed without using bedrails?: A Little Help needed moving to and from a bed to a chair (including a wheelchair)?: A Little Help needed standing up from a chair using your arms (e.g., wheelchair or bedside chair)?: A Little Help needed to walk in hospital room?: A Little Help needed climbing 3-5 steps with  a railing? : A Lot 6 Click Score: 17    End of Session Equipment Utilized During Treatment: Gait belt Activity Tolerance: Patient tolerated treatment well Patient left: with call bell/phone within reach;with family/visitor present;in chair;with nursing/sitter in room Nurse Communication: Mobility status PT Visit Diagnosis: Unsteadiness on feet (R26.81);Repeated falls (R29.6);Other abnormalities of gait and mobility (R26.89);Difficulty in walking, not elsewhere classified (R26.2)     Time: 1191-4782 PT Time Calculation (min) (ACUTE ONLY): 14 min  Charges:  $Gait Training: 8-22 mins                     Lindalou Hose, PT DPT Acute Rehabilitation Services Office [phone]    Leonie Man 02/13/2023, 3:52 PM

## 2023-02-13 NOTE — Discharge Instructions (Signed)

## 2023-02-13 NOTE — Progress Notes (Addendum)
PROGRESS NOTE        PATIENT DETAILS Name: Jill Snow Age: 89 y.o. Sex: female Date of Birth: [birth year] Admit Date: 02/10/2023 Admitting Physician Emeline General, MD [id number], Yehuda Mao, MD  Brief Summary: Patient is a 89 y.o.  female with history of dementia, HFpEF, HTN, A-fib on Eliquis-who sustained a unwitnessed fall-following which she had right hip pain.  Upon further evaluation-she was found to have hematoma involving right iliopsoas muscle.  Significant events: 6/26>> admit to [facility]  Significant studies: 6/26>> x-ray pelvis: No fracture 6/26>> x-ray chest: No PNA 6/26>> CT head: No acute intracranial abnormality 6/26>> CT C-spine: No fracture 6/26>> CT right femur: Intramuscular hematomas involving distal right iliopsoas/right pectineus.  No evidence of active bleeding. 6/27>> echo: EF 60-65%, grade 2 diastolic dysfunction.  Significant microbiology data: 6/27>> urine culture: E Coli  Procedures: None  Consults: None  Subjective: Pleasantly confused-had delirium yesterday evening/last night.  Sitter at bedside.  Objective: Vitals: Blood pressure (!) 102/58, pulse 77, temperature (!) 97.5 F (36.4 C), temperature source Axillary, resp. rate 18, height 5\' 8"  (1.727 m), weight 68 kg, SpO2 99 %.   Exam: No major issues overnight.  Lying comfortably in bed.  Pertinent Labs/Radiology:    Latest Ref Rng & Units 02/13/2023    3:17 AM 02/12/2023    8:08 AM 02/11/2023    5:57 AM  CBC  WBC 4.0 - 10.5 K/uL 5.1  6.5  4.6   Hemoglobin 12.0 - 15.0 g/dL 11.9  14.7  82.9   Hematocrit 36.0 - 46.0 % 32.4  32.9  33.6   Platelets 150 - 400 K/uL 232  245  229     Lab Results  Component Value Date   NA 139 02/11/2023   K 3.7 02/11/2023   CL 104 02/11/2023   CO2 27 02/11/2023      Assessment/Plan: Fall Unwitnessed Unclear if this was a mechanical fall or perhaps a syncopal episode Telemetry without arrhythmias Echocardiogram with  stable EF Ambulate with PT/OT-SNF recommended. However family contemplating taking home as she is more delirious outside of her familiar setting  Addendum: Spoke w daughter [name] [phone] -family has had a chance to discuss, they have decided to take patient home. They request PT re-eval to see how much patient can ambulate, and that we arrange certain DME and home health services. Have alerted [facility] team. Based on how patient does-and DME needs-family willing to take patient home 6/30  Right iliopsoas/pectineus muscle hematoma with acute blood loss anemia Secondary to fall-with Eliquis use Hb stable for past 3 days After extensive discussion with daughter Angela-family would like anticoagulation resumed-they understand catastrophic fall risk leading to life-threatening/life disabling effects.  Plans are to discharge to SNF where she will will be under supervision.  Resume Eliquis at family's request today.  PAF Telemetry monitoring Amiodarone Eliquis-see above  History of dementia with delirium Developed severe delirium 6/27-requiring several doses of IV Haldol/Zyprexa and restraints No major issues with delirium overnight-no further restraint use-continue Seroquel/melatonin Continue to maintain delirium precautions.  HLD Statin  BMI: Estimated body mass index is 22.81 kg/m as calculated from the following:   Height as of this encounter: 5\' 8"  (1.727 m).   Weight as of this encounter: 68 kg.   Code status:   Code Status: Full Code   DVT Prophylaxis:   Family Communication:  Daughter Angela-at bedside on 6/28.   Daughter-Lisa [phone] -called 6/27-left VM   Disposition Plan: Status is: Observation The patient will require care spanning > 2 midnights and should be moved to inpatient because: Severity of illness   Planned Discharge Destination:Home   Diet: Diet Order             Diet Heart Room service appropriate? Yes; Fluid consistency: Thin  Diet effective  now                     Antimicrobial agents: Anti-infectives (From admission, onward)    None        MEDICATIONS: Scheduled Meds:  amiodarone  200 mg Oral Daily   apixaban  5 mg Oral BID   atorvastatin  40 mg Oral Daily   furosemide  20 mg Oral Daily   melatonin  10 mg Oral QHS   montelukast  10 mg Oral QHS   pantoprazole  40 mg Oral Daily   potassium chloride SA  20 mEq Oral QHS   QUEtiapine  50 mg Oral QHS   Continuous Infusions: PRN Meds:.acetaminophen **OR** acetaminophen, haloperidol lactate, HYDROmorphone (DILAUDID) injection, ondansetron **OR** ondansetron (ZOFRAN) IV, polyethylene glycol   I have personally reviewed following labs and imaging studies  LABORATORY DATA: CBC: Recent Labs  Lab 02/10/23 0927 02/11/23 0557 02/12/23 0808 02/13/23 0317  WBC 15.0* 4.6 6.5 5.1  NEUTROABS 12.7*  --   --   --   HGB 10.6* 10.9* 10.7* 10.4*  HCT 32.4* 33.6* 32.9* 32.4*  MCV 99.4 99.1 98.8 99.4  PLT 258 229 245 232     Basic Metabolic Panel: Recent Labs  Lab 02/10/23 0927 02/11/23 0557  NA 138 139  K 3.9 3.7  CL 99 104  CO2 25 27  GLUCOSE 115* 96  BUN 16 11  CREATININE 0.98 0.89  CALCIUM 9.3 9.0     GFR: Estimated Creatinine Clearance: 44.9 mL/min (by C-G formula based on SCr of 0.89 mg/dL).  Liver Function Tests: Recent Labs  Lab 02/10/23 0927  AST 26  ALT 24  ALKPHOS 64  BILITOT 2.2*  PROT 6.7  ALBUMIN 3.3*    No results for input(s): "LIPASE", "AMYLASE" in the last 168 hours. No results for input(s): "AMMONIA" in the last 168 hours.  Coagulation Profile: No results for input(s): "INR", "PROTIME" in the last 168 hours.  Cardiac Enzymes: No results for input(s): "CKTOTAL", "CKMB", "CKMBINDEX", "TROPONINI" in the last 168 hours.  BNP (last 3 results) Recent Labs    08/26/22 0828  PROBNP 59.0     Lipid Profile: No results for input(s): "CHOL", "HDL", "LDLCALC", "TRIG", "CHOLHDL", "LDLDIRECT" in the last 72  hours.  Thyroid Function Tests: No results for input(s): "TSH", "T4TOTAL", "FREET4", "T3FREE", "THYROIDAB" in the last 72 hours.  Anemia Panel: No results for input(s): "VITAMINB12", "FOLATE", "FERRITIN", "TIBC", "IRON", "RETICCTPCT" in the last 72 hours.  Urine analysis:    Component Value Date/Time   COLORURINE YELLOW 02/11/2023 0513   APPEARANCEUR CLOUDY (A) 02/11/2023 0513   APPEARANCEUR Hazy 05/05/2012 0014   LABSPEC 1.010 02/11/2023 0513   LABSPEC 1.017 05/05/2012 0014   PHURINE 6.0 02/11/2023 0513   GLUCOSEU NEGATIVE 02/11/2023 0513   GLUCOSEU NEGATIVE 06/04/2021 1134   HGBUR SMALL (A) 02/11/2023 0513   BILIRUBINUR NEGATIVE 02/11/2023 0513   BILIRUBINUR neg 06/09/2021 1621   BILIRUBINUR Negative 05/05/2012 0014   KETONESUR NEGATIVE 02/11/2023 0513   PROTEINUR NEGATIVE 02/11/2023 0513   UROBILINOGEN 0.2 06/09/2021 1621  UROBILINOGEN 0.2 06/04/2021 1134   NITRITE NEGATIVE 02/11/2023 0513   LEUKOCYTESUR LARGE (A) 02/11/2023 0513   LEUKOCYTESUR 3+ 05/05/2012 0014    Sepsis Labs: Lactic Acid, Venous    Component Value Date/Time   LATICACIDVEN 1.3 12/25/2022 1353    MICROBIOLOGY: Recent Results (from the past 240 hour(s))  Urine Culture     Status: Abnormal   Collection Time: 02/11/23  5:13 AM   Specimen: Urine, Random  Result Value Ref Range Status   Specimen Description URINE, RANDOM  Final   Special Requests   Final    NONE Reflexed from [id number] Performed at [facility], [address], [place], [place] [address]    Culture >=100,000 COLONIES/mL ESCHERICHIA COLI (A)  Final   Report Status 02/13/2023 FINAL  Final   Organism ID, Bacteria ESCHERICHIA COLI (A)  Final      Susceptibility   Escherichia coli - MIC*    AMPICILLIN <=2 SENSITIVE Sensitive     CEFAZOLIN <=4 SENSITIVE Sensitive     CEFEPIME <=0.12 SENSITIVE Sensitive     CEFTRIAXONE <=0.25 SENSITIVE Sensitive     CIPROFLOXACIN >=4 RESISTANT Resistant     GENTAMICIN <=1 SENSITIVE Sensitive      IMIPENEM <=0.25 SENSITIVE Sensitive     NITROFURANTOIN <=16 SENSITIVE Sensitive     TRIMETH/SULFA >=320 RESISTANT Resistant     AMPICILLIN/SULBACTAM <=2 SENSITIVE Sensitive     PIP/TAZO <=4 SENSITIVE Sensitive     * >=100,000 COLONIES/mL ESCHERICHIA COLI    RADIOLOGY STUDIES/RESULTS: ECHOCARDIOGRAM COMPLETE  Result Date: 02/11/2023    ECHOCARDIOGRAM REPORT   Patient Name:   CONNAR ROTHENBERGER Martha Date of Exam: 02/11/2023 Medical Rec #:  [id number]           Height:       68.0 in Accession #:    [id number]          Weight:       150.0 lb Date of Birth:  02-04-1934            BSA:          1.809 m Patient Age:    89 years            BP:           113/71 mmHg Patient Gender: F                   HR:           72 bpm. Exam Location:  Inpatient Procedure: 2D Echo, Cardiac Doppler and Color Doppler Indications:    CHF-Acute Diastolic I50.31  History:        Patient has prior history of Echocardiogram examinations, most                 recent 03/10/2021. CHF, Mitral Valve Disease, Arrythmias:Atrial                 Fibrillation and RBBB, Signs/Symptoms:Altered Mental Status;                 Risk Factors:Dyslipidemia, Non-Smoker, Hypertension and Sleep                 Apnea.  Sonographer:    Aron Baba Referring Phys: [id number] Emeline General  Sonographer Comments: No subcostal window. IMPRESSIONS  1. Left ventricular ejection fraction, by estimation, is 60 to 65%. The left ventricle has normal function. The left ventricle has no regional wall motion abnormalities. Left ventricular diastolic parameters are consistent with Grade  II diastolic dysfunction (pseudonormalization).  2. Right ventricular systolic function is normal. The right ventricular size is normal. Tricuspid regurgitation signal is inadequate for assessing PA pressure.  3. Left atrial size was moderately dilated.  4. The mitral valve is degenerative. Mild to moderate mitral valve regurgitation.  5. The aortic valve is tricuspid. Aortic valve regurgitation  is not visualized. Aortic valve sclerosis/calcification is present, without any evidence of aortic stenosis. Comparison(s): No significant change from prior study. FINDINGS  Left Ventricle: Left ventricular ejection fraction, by estimation, is 60 to 65%. The left ventricle has normal function. The left ventricle has no regional wall motion abnormalities. The left ventricular internal cavity size was normal in size. There is  no left ventricular hypertrophy. Left ventricular diastolic parameters are consistent with Grade II diastolic dysfunction (pseudonormalization). Right Ventricle: The right ventricular size is normal. No increase in right ventricular wall thickness. Right ventricular systolic function is normal. Tricuspid regurgitation signal is inadequate for assessing PA pressure. Left Atrium: Left atrial size was moderately dilated. Right Atrium: Right atrial size was normal in size. Pericardium: There is no evidence of pericardial effusion. Mitral Valve: The mitral valve is degenerative in appearance. There is mild thickening of the mitral valve leaflet(s). There is mild calcification of the mitral valve leaflet(s). Mild to moderate mitral valve regurgitation. MV peak gradient, 4.7 mmHg. The mean mitral valve gradient is 2.0 mmHg. Tricuspid Valve: The tricuspid valve is normal in structure. Tricuspid valve regurgitation is trivial. Aortic Valve: The aortic valve is tricuspid. Aortic valve regurgitation is not visualized. Aortic valve sclerosis/calcification is present, without any evidence of aortic stenosis. Pulmonic Valve: The pulmonic valve was normal in structure. Pulmonic valve regurgitation is trivial. Aorta: The aortic root is normal in size and structure. IAS/Shunts: The atrial septum is grossly normal.  LEFT VENTRICLE PLAX 2D LVIDd:         3.60 cm   Diastology LVIDs:         2.50 cm   LV e' medial:    6.60 cm/s LV PW:         0.60 cm   LV E/e' medial:  15.3 LV IVS:        0.60 cm   LV e' lateral:    7.58 cm/s LVOT diam:     1.70 cm   LV E/e' lateral: 13.3 LV SV:         42 LV SV Index:   23 LVOT Area:     2.27 cm  RIGHT VENTRICLE RV S prime:     10.10 cm/s TAPSE (M-mode): 2.4 cm LEFT ATRIUM           Index        RIGHT ATRIUM           Index LA diam:      4.00 cm 2.21 cm/m   RA Area:     11.00 cm LA Vol (A2C): 60.1 ml 33.23 ml/m  RA Volume:   20.60 ml  11.39 ml/m LA Vol (A4C): 75.5 ml 41.75 ml/m  AORTIC VALVE LVOT Vmax:   85.50 cm/s LVOT Vmean:  56.050 cm/s LVOT VTI:    0.186 m  AORTA Ao Root diam: 3.20 cm Ao Asc diam:  3.60 cm MITRAL VALVE MV Area (PHT): 2.99 cm      SHUNTS MV Area VTI:   1.30 cm      Systemic VTI:  0.19 m MV Peak grad:  4.7 mmHg      Systemic Diam: 1.70 cm  MV Mean grad:  2.0 mmHg MV Vmax:       1.08 m/s MV Vmean:      63.5 cm/s MV Decel Time: 254 msec MR Peak grad:   120.6 mmHg MR Mean grad:   73.0 mmHg MR Vmax:        549.00 cm/s MR Vmean:       402.0 cm/s MR PISA:        0.57 cm MR PISA Radius: 0.30 cm MV E velocity: 101.00 cm/s MV A velocity: 54.80 cm/s MV E/A ratio:  1.84 Laurance Flatten MD Electronically signed by Laurance Flatten MD Signature Date/Time: 02/11/2023/1:02:43 PM    Final      LOS: 0 days   Jeoffrey Massed, MD  [facility]    To contact the attending provider between 7A-7P or the covering provider during after hours 7P-7A, please log into the web site [email or web address] and access using universal [place] password for that web site. If you do not have the password, please call the hospital operator.  02/13/2023, 10:05 AM

## 2023-02-13 NOTE — TOC Progression Note (Addendum)
Transition of Care [facility]) - Progression Note    Patient Details  Name: Jill Snow MRN: [id number] Date of Birth: 1933-10-25  [facility]) CM/SW Contact  Ronny Bacon, RN Phone Number: 02/13/2023, 1:12 PM  Clinical Narrative:  Family decided to take patient home with home health services. DME equipment arranged through [facility]. Able to get RW, but [employer] rep will talk to patient and family to discuss shower chair being an out of pocket cost. HH PT arrangement attempt made using [facility], awaiting response.  1344: [facility] accepted patient for [facility] PT     Expected Discharge Plan: Skilled Nursing Facility Barriers to Discharge: Continued Medical Work up, English as a second language teacher, SNF Pending bed offer  Expected Discharge Plan and Services In-house Referral: Clinical Social Work   Post Acute Care Choice: Skilled Nursing Facility Living arrangements for the past 2 months: Single Family Home                                       Social Determinants of Health (SDOH) Interventions SDOH Screenings   Food Insecurity: No Food Insecurity (12/28/2022)  Housing: Low Risk  (12/28/2022)  Transportation Needs: No Transportation Needs (12/28/2022)  Utilities: Not At Risk (12/27/2022)  Depression (PHQ2-9): Low Risk  (11/10/2022)  Financial Resource Strain: Low Risk  (06/02/2021)  Physical Activity: Insufficiently Active (06/02/2021)  Social Connections: Unknown (06/02/2021)  Stress: No Stress Concern Present (06/02/2021)  Tobacco Use: Low Risk  (02/10/2023)    Readmission Risk Interventions     No data to display

## 2023-02-13 NOTE — Plan of Care (Signed)

## 2023-02-14 ENCOUNTER — Other Ambulatory Visit: Payer: Self-pay | Admitting: Cardiovascular Disease

## 2023-02-14 DIAGNOSIS — S301XXA Contusion of abdominal wall, initial encounter: Secondary | ICD-10-CM | POA: Diagnosis not present

## 2023-02-14 LAB — CBC
HCT: 31.2 % — ABNORMAL LOW (ref 36.0–46.0)
Hemoglobin: 10.1 g/dL — ABNORMAL LOW (ref 12.0–15.0)
MCH: 32.3 pg (ref 26.0–34.0)
MCHC: 32.4 g/dL (ref 30.0–36.0)
MCV: 99.7 fL (ref 80.0–100.0)
Platelets: 224 10*3/uL (ref 150–400)
RBC: 3.13 MIL/uL — ABNORMAL LOW (ref 3.87–5.11)
RDW: 12.9 % (ref 11.5–15.5)
WBC: 5.8 10*3/uL (ref 4.0–10.5)
nRBC: 0 % (ref 0.0–0.2)

## 2023-02-14 MED ORDER — HALOPERIDOL 2 MG PO TABS: 2.0000 mg | ORAL_TABLET | Freq: Two times a day (BID) | ORAL | 0 refills | Status: DC | PRN

## 2023-02-14 MED ORDER — CEPHALEXIN 500 MG PO CAPS
500.0000 mg | ORAL_CAPSULE | Freq: Three times a day (TID) | ORAL | Status: DC
  Administered 2023-02-14: 500 mg via ORAL
  Filled 2023-02-14: qty 1

## 2023-02-14 MED ORDER — CEPHALEXIN 500 MG PO CAPS: 500.0000 mg | ORAL_CAPSULE | Freq: Three times a day (TID) | ORAL | 0 refills | Status: AC

## 2023-02-14 NOTE — TOC Transition Note (Signed)
Transition of Care [facility]) - CM/SW Discharge Note   Patient Details  Name: Jill Snow MRN: [id number] Date of Birth: 09-04-1933  [facility] of [facility]) CM/SW Contact:  Ronny Bacon, RN Phone Number: 02/14/2023, 9:17 AM   Clinical Narrative:  Patient being discharged home today. Daughter Jill Snow confirmed patient has RW and that family will buy a shower bench from a store of their choice.  [facility] aware patient is being discharged home today. Daughter will transport patient home.     Final next level of care: Home w Home Health Services Barriers to Discharge: No Barriers Identified   Patient Goals and CMS Choice CMS Medicare.gov Compare Post Acute Care list provided to:: Patient Represenative (must comment) Choice offered to / list presented to : Adult Children  Discharge Placement                         Discharge Plan and Services Additional resources added to the After Visit Summary for   In-house Referral: Clinical Social Work   Post Acute Care Choice: Skilled Nursing Facility          DME Arranged: Dan Humphreys rolling DME Agency: [employer] Date DME Agency Contacted: 02/13/23 Time DME Agency Contacted: [phone] Representative spoke with at DME Agency: Leavy Cella HH Arranged: PT HH Agency: [facility] Date [facility] Agency Contacted: 02/13/23 Time HH Agency Contacted: 1343 Representative spoke with at [facility] Agency: Kandee Keen  Social Determinants of Health (SDOH) Interventions SDOH Screenings   Food Insecurity: No Food Insecurity (12/28/2022)  Housing: Low Risk  (12/28/2022)  Transportation Needs: No Transportation Needs (12/28/2022)  Utilities: Not At Risk (12/27/2022)  Depression (PHQ2-9): Low Risk  (11/10/2022)  Financial Resource Strain: Low Risk  (06/02/2021)  Physical Activity: Insufficiently Active (06/02/2021)  Social Connections: Unknown (06/02/2021)  Stress: No Stress Concern Present (06/02/2021)  Tobacco Use: Low Risk  (02/10/2023)      Readmission Risk Interventions     No data to display

## 2023-02-14 NOTE — Discharge Summary (Addendum)
Jill Snow VFI:[id number] DOB: [birth year] DOA: 02/10/2023  PCP: Glori Luis, MD  Admit date: 02/10/2023  Discharge date: 02/14/2023  Admitted From: Home   Disposition:  Home   Recommendations for Outpatient Follow-up:   Follow up with PCP in 1-2 weeks  PCP Please obtain BMP/CBC, 2 view CXR in 1week,  (see Discharge instructions)   PCP Please follow up on the following pending results:    Home Health: PT   Equipment/Devices: as below  Consultations: None  Discharge Condition: Stable    CODE STATUS: Full    Diet Recommendation: Heart Healthy       Brief history of present illness from the day of admission and additional interim summary    89 y.o.  female with history of dementia, HFpEF, HTN, A-fib on Eliquis-who sustained a unwitnessed fall-following which she had right hip pain.  Upon further evaluation-she was found to have hematoma involving right iliopsoas muscle.   Significant events: 6/26>> admit to [facility]   Significant studies: 6/26>> x-ray pelvis: No fracture 6/26>> x-ray chest: No PNA 6/26>> CT head: No acute intracranial abnormality 6/26>> CT C-spine: No fracture 6/26>> CT right femur: Intramuscular hematomas involving distal right iliopsoas/right pectineus.  No evidence of active bleeding. 6/27>> echo: EF 60-65%, grade 2 diastolic dysfunction.   Significant microbiology data: 6/27>> urine culture: E [facility] Course   Fall Unwitnessed Unclear if this was a mechanical fall or perhaps a syncopal episode Telemetry without arrhythmias Echocardiogram with stable EF Ambulate with PT/OT-SNF recommended.  Will be discharged home with family providing supervision, home health PT and walker also added.   Note previous  MD - Spoke w daughter [name] [phone] -family has had a chance to discuss, they have decided to take patient home. They request PT re-eval to see how much patient can ambulate, and that we arrange certain DME and home health services. Have alerted [facility] team. Based on how patient does-and DME needs-family willing to take patient home 02/14/23.  Previous MD also had detailed discussion with patient's daughter Marylene Land about risks and benefits of anticoagulation, family was of the opinion that they had had multiple family members with stroke and they would prefer the risk of continuing Eliquis in the setting of fall, they understand that patient can be at risk for catastrophic head injury or other life-threatening bleeding.  They would like to continue Eliquis.   Right iliopsoas/pectineus muscle hematoma with acute blood loss anemia Secondary to fall-with Eliquis use Hb stable, Eliquis resumed as per discussion above, PCP to monitor CBC postdischarge   PAF [place] vas 2 score of greater than 3 Telemetry monitoring Amiodarone Eliquis-see above  History of dementia with delirium/metabolic encephalopathy Developed severe delirium 6/27-required restraints along with Seroquel and as needed Haldol, much improved morning of 02/14/2023 will be discharged on low-dose as needed Haldol, hopefully with familiar settings her delirium will improve, PCP to monitor.   E. coli UTI.  3 days of oral Keflex.  Relatively symptom-free.    HLD Statin    Discharge diagnosis     Active Problems:   Atrial fibrillation (HCC)   AMS (altered mental status)   Psoas hematoma, right, secondary to anticoagulant therapy, initial encounter   Fall    Discharge instructions    Discharge Instructions     Discharge instructions   Complete by: As directed    Follow with Primary MD Glori Luis, MD in 7 days   Get CBC, CMP, Magnesium -  checked next visit with your primary MD    Activity: As tolerated with Full  fall precautions use walker/cane & assistance as needed  Disposition Home    Diet: Heart Healthy    Special Instructions: If you have smoked or chewed Tobacco  in the last 2 yrs please stop smoking, stop any regular Alcohol  and or any Recreational drug use.  On your next visit with your primary care physician please Get Medicines reviewed and adjusted.  Please request your Prim.MD to go over all Hospital Tests and Procedure/Radiological results at the follow up, please get all Hospital records sent to your Prim MD by signing hospital release before you go home.  If you experience worsening of your admission symptoms, develop shortness of breath, life threatening emergency, suicidal or homicidal thoughts you must seek medical attention immediately by calling 911 or calling your MD immediately  if symptoms less severe.  You Must read complete instructions/literature along with all the possible adverse reactions/side effects for all the Medicines you take and that have been prescribed to you. Take any new Medicines after you have completely understood and accpet all the possible adverse reactions/side effects.   Increase activity slowly   Complete by: As directed        Discharge Medications   Allergies as of 02/14/2023       Reactions   Augmentin [amoxicillin-pot Clavulanate] Nausea And Vomiting   Codeine Anaphylaxis   Crestor [rosuvastatin] Other (See Comments)   Myalgias    Penicillins Other (See Comments)   Unknown reaction Tolerates cephalosporins         Medication List     TAKE these medications    acetaminophen 650 MG CR tablet Commonly known as: TYLENOL Take 1,300 mg by mouth every 8 (eight) hours as needed for pain.   amiodarone 200 MG tablet Commonly known as: PACERONE Take 1 tablet (200 mg total) by mouth daily.   atorvastatin 40 MG tablet Commonly known as: LIPITOR Take 1 tablet (40 mg total) by mouth daily.   cephALEXin 500 MG capsule Commonly known  as: KEFLEX Take 1 capsule (500 mg total) by mouth every 8 (eight) hours for 3 days.   Eliquis 5 MG Tabs tablet Generic drug: apixaban TAKE 1 TABLET BY MOUTH TWICE A DAY What changed:  how much to take when to take this   furosemide 20 MG tablet Commonly known as: LASIX TAKE 1 TABLET DAILY. MAY TAKE 1 EXTRA TABLET DAILY AS NEEDED FOR WEIGHT GAIN OF 2 POUNDS OVERNIGHT OR 5 POUNDS IN 1 WEEK What changed:  how much to take how to take this when to take this additional instructions   haloperidol 2  MG tablet Commonly known as: HALDOL Take 1 tablet (2 mg total) by mouth 2 (two) times daily as needed for agitation.   ibuprofen 200 MG tablet Commonly known as: ADVIL Take 600 mg by mouth every 6 (six) hours as needed for moderate pain.   Klor-Con M20 20 MEQ tablet Generic drug: potassium chloride SA TAKE 1 TABLET BY MOUTH EVERY DAY What changed: when to take this   montelukast 10 MG tablet Commonly known as: SINGULAIR Take 1 tablet (10 mg total) by mouth daily. What changed: when to take this   pantoprazole 40 MG tablet Commonly known as: PROTONIX Take 1 tablet (40 mg total) by mouth daily.   polyethylene glycol 17 g packet Commonly known as: MIRALAX / GLYCOLAX Take 17 g by mouth daily as needed for mild constipation.   VITAMIN D-3 PO Take 1 capsule by mouth at bedtime.   VITAMIN E PO Take 1 capsule by mouth daily.               Durable Medical Equipment  (From admission, onward)           Start     Ordered   02/14/23 0856  For home use only DME Walker rolling  Once       Comments: 5 wheel  Question Answer Comment  Walker: With 5 Inch Wheels   Patient needs a walker to treat with the following condition Weakness      02/14/23 0855             Contact information for follow-up providers     Care, [facility] Follow up.   Specialty: Home Health Services Why: Physical therapy. Will call to follow up after discharge. Contact  information: [address] [place] [address] [phone]         Glori Luis, MD. Schedule an appointment as soon as possible for a visit in 1 week(s).   Specialty: Family Medicine Contact information: [address] [place] [address] [phone]              Contact information for after-discharge care     Destination     [facility] Preferred SNF .   Service: Skilled Nursing Contact information: [address] [place] [address] [phone]                     Major procedures and Radiology Reports - PLEASE review detailed and final reports thoroughly  -     ECHOCARDIOGRAM COMPLETE  Result Date: 02/11/2023    ECHOCARDIOGRAM REPORT   Patient Name:   Jill Snow Date of Exam: 02/11/2023 Medical Rec #:  [id number]           Height:       68.0 in Accession #:    [id number]          Weight:       150.0 lb Date of Birth:  10-Apr-1934            BSA:          1.809 m Patient Age:    89 years            BP:           113/71 mmHg Patient Gender: F                   HR:  72 bpm. Exam Location:  Inpatient Procedure: 2D Echo, Cardiac Doppler and Color Doppler Indications:    CHF-Acute Diastolic I50.31  History:        Patient has prior history of Echocardiogram examinations, most                 recent 03/10/2021. CHF, Mitral Valve Disease, Arrythmias:Atrial                 Fibrillation and RBBB, Signs/Symptoms:Altered Mental Status;                 Risk Factors:Dyslipidemia, Non-Smoker, Hypertension and Sleep                 Apnea.  Sonographer:    Aron Baba Referring Phys: [id number] Emeline General  Sonographer Comments: No subcostal window. IMPRESSIONS  1. Left ventricular ejection fraction, by estimation, is 60 to 65%. The left ventricle has normal function. The left ventricle has no regional wall motion abnormalities. Left ventricular diastolic parameters are consistent with Grade II  diastolic dysfunction (pseudonormalization).  2. Right ventricular systolic function is normal. The right ventricular size is normal. Tricuspid regurgitation signal is inadequate for assessing PA pressure.  3. Left atrial size was moderately dilated.  4. The mitral valve is degenerative. Mild to moderate mitral valve regurgitation.  5. The aortic valve is tricuspid. Aortic valve regurgitation is not visualized. Aortic valve sclerosis/calcification is present, without any evidence of aortic stenosis. Comparison(s): No significant change from prior study. FINDINGS  Left Ventricle: Left ventricular ejection fraction, by estimation, is 60 to 65%. The left ventricle has normal function. The left ventricle has no regional wall motion abnormalities. The left ventricular internal cavity size was normal in size. There is  no left ventricular hypertrophy. Left ventricular diastolic parameters are consistent with Grade II diastolic dysfunction (pseudonormalization). Right Ventricle: The right ventricular size is normal. No increase in right ventricular wall thickness. Right ventricular systolic function is normal. Tricuspid regurgitation signal is inadequate for assessing PA pressure. Left Atrium: Left atrial size was moderately dilated. Right Atrium: Right atrial size was normal in size. Pericardium: There is no evidence of pericardial effusion. Mitral Valve: The mitral valve is degenerative in appearance. There is mild thickening of the mitral valve leaflet(s). There is mild calcification of the mitral valve leaflet(s). Mild to moderate mitral valve regurgitation. MV peak gradient, 4.7 mmHg. The mean mitral valve gradient is 2.0 mmHg. Tricuspid Valve: The tricuspid valve is normal in structure. Tricuspid valve regurgitation is trivial. Aortic Valve: The aortic valve is tricuspid. Aortic valve regurgitation is not visualized. Aortic valve sclerosis/calcification is present, without any evidence of aortic stenosis. Pulmonic  Valve: The pulmonic valve was normal in structure. Pulmonic valve regurgitation is trivial. Aorta: The aortic root is normal in size and structure. IAS/Shunts: The atrial septum is grossly normal.  LEFT VENTRICLE PLAX 2D LVIDd:         3.60 cm   Diastology LVIDs:         2.50 cm   LV e' medial:    6.60 cm/s LV PW:         0.60 cm   LV E/e' medial:  15.3 LV IVS:        0.60 cm   LV e' lateral:   7.58 cm/s LVOT diam:     1.70 cm   LV E/e' lateral: 13.3 LV SV:         42 LV SV Index:   23 LVOT Area:  2.27 cm  RIGHT VENTRICLE RV S prime:     10.10 cm/s TAPSE (M-mode): 2.4 cm LEFT ATRIUM           Index        RIGHT ATRIUM           Index LA diam:      4.00 cm 2.21 cm/m   RA Area:     11.00 cm LA Vol (A2C): 60.1 ml 33.23 ml/m  RA Volume:   20.60 ml  11.39 ml/m LA Vol (A4C): 75.5 ml 41.75 ml/m  AORTIC VALVE LVOT Vmax:   85.50 cm/s LVOT Vmean:  56.050 cm/s LVOT VTI:    0.186 m  AORTA Ao Root diam: 3.20 cm Ao Asc diam:  3.60 cm MITRAL VALVE MV Area (PHT): 2.99 cm      SHUNTS MV Area VTI:   1.30 cm      Systemic VTI:  0.19 m MV Peak grad:  4.7 mmHg      Systemic Diam: 1.70 cm MV Mean grad:  2.0 mmHg MV Vmax:       1.08 m/s MV Vmean:      63.5 cm/s MV Decel Time: 254 msec MR Peak grad:   120.6 mmHg MR Mean grad:   73.0 mmHg MR Vmax:        549.00 cm/s MR Vmean:       402.0 cm/s MR PISA:        0.57 cm MR PISA Radius: 0.30 cm MV E velocity: 101.00 cm/s MV A velocity: 54.80 cm/s MV E/A ratio:  1.84 Laurance Flatten MD Electronically signed by Laurance Flatten MD Signature Date/Time: 02/11/2023/1:02:43 PM    Final    VAS [name] LOWER EXTREMITY VENOUS (DVT) (ONLY [facility] & [facility])  Result Date: 02/10/2023  Lower Venous DVT Study Patient Name:  Jill Snow  Date of Exam:   02/10/2023 Medical Rec #: [id number]            Accession #:    [id number] Date of Birth: 1934-07-24             Patient Gender: F Patient Age:   58 years Exam Location:  [facility] Procedure:      VAS [name] LOWER EXTREMITY VENOUS (DVT) Referring  Phys: Benjiman Core --------------------------------------------------------------------------------  Indications: Leg pain, s/p fall.  Anticoagulation: Eliquis. Comparison Study: 05-16-2018 Prior left lower extremity was negative for DVT. Performing Technologist: Jean Rosenthal RDMS, RVT  Examination Guidelines: A complete evaluation includes B-mode imaging, spectral Doppler, color Doppler, and power Doppler as needed of all accessible portions of each vessel. Bilateral testing is considered an integral part of a complete examination. Limited examinations for reoccurring indications may be performed as noted. The reflux portion of the exam is performed with the patient in reverse Trendelenburg.  +---------+---------------+---------+-----------+----------+--------------+ RIGHT    CompressibilityPhasicitySpontaneityPropertiesThrombus Aging +---------+---------------+---------+-----------+----------+--------------+ CFV      Full           Yes      Yes                                 +---------+---------------+---------+-----------+----------+--------------+ SFJ      Full                                                        +---------+---------------+---------+-----------+----------+--------------+  FV Prox  Full                                                        +---------+---------------+---------+-----------+----------+--------------+ FV Mid   Full                                                        +---------+---------------+---------+-----------+----------+--------------+ FV DistalFull                                                        +---------+---------------+---------+-----------+----------+--------------+ PFV      Full                                                        +---------+---------------+---------+-----------+----------+--------------+ POP      Full           Yes      Yes                                  +---------+---------------+---------+-----------+----------+--------------+ PTV      Full                                                        +---------+---------------+---------+-----------+----------+--------------+ PERO     Full                                                        +---------+---------------+---------+-----------+----------+--------------+   +----+---------------+---------+-----------+----------+--------------+ LEFTCompressibilityPhasicitySpontaneityPropertiesThrombus Aging +----+---------------+---------+-----------+----------+--------------+ CFV Full           Yes      Yes                                 +----+---------------+---------+-----------+----------+--------------+     Summary: RIGHT: - There is no evidence of deep vein thrombosis in the lower extremity.  - No cystic structure found in the popliteal fossa.  LEFT: - No evidence of common femoral vein obstruction.  *See table(s) above for measurements and observations. Electronically signed by Coral Else MD on 02/10/2023 at 8:36:24 PM.    Final    CT FEMUR RIGHT W CONTRAST  Result Date: 02/10/2023 CLINICAL DATA:  Right hip pain and bruising. EXAM: CT OF THE LOWER RIGHT EXTREMITY WITH CONTRAST TECHNIQUE: Multidetector CT imaging of the lower right extremity was performed according to the standard protocol following intravenous contrast administration. RADIATION DOSE REDUCTION: This exam was  performed according to the departmental dose-optimization program which includes automated exposure control, adjustment of the mA and/or kV according to patient size and/or use of iterative reconstruction technique. CONTRAST:  75mL OMNIPAQUE IOHEXOL 350 MG/ML SOLN COMPARISON:  Pelvic x-rays from same day. FINDINGS: Bones/Joint/Cartilage No fracture or dislocation. Mild degenerative changes of the right hip and knee. No significant joint effusion. Ligaments Ligaments are suboptimally evaluated by CT. Muscles and  Tendons Asymmetric isodense enlargement of the distal right iliopsoas muscle (series 4, image 50) as well as the right pectineus muscle (series 4, image 69). No contrast extravasation. Right gluteus minimus and medius muscle atrophy. Soft tissue Soft tissue swelling of the lateral and posterior distal thigh and knee. No fluid collection or hematoma. No soft tissue mass. Small fat containing right hernia. IMPRESSION: 1. Asymmetric isodense enlargement of the distal right iliopsoas muscle and right pectineus muscle, concerning for intramuscular hematomas. No evidence of active bleeding. 2. Soft tissue swelling of the lateral and posterior distal thigh and knee. No fluid collection or hematoma. 3. No acute osseous abnormality. Electronically Signed   By: Obie Dredge M.D.   On: 02/10/2023 12:28   CT HEAD WO CONTRAST ( )  Result Date: 02/10/2023 CLINICAL DATA:  Head trauma, moderate-severe; Neck trauma (Age >= 65y) EXAM: CT HEAD WITHOUT CONTRAST CT CERVICAL SPINE WITHOUT CONTRAST TECHNIQUE: Multidetector CT imaging of the head and cervical spine was performed following the standard protocol without intravenous contrast. Multiplanar CT image reconstructions of the cervical spine were also generated. RADIATION DOSE REDUCTION: This exam was performed according to the departmental dose-optimization program which includes automated exposure control, adjustment of the mA and/or kV according to patient size and/or use of iterative reconstruction technique. COMPARISON:  Cervical spine radiograph 05/19/2017 FINDINGS: CT HEAD FINDINGS Limitations: Motion degraded exam. Brain: No evidence of hemorrhage, hydrocephalus, extra-axial collection or mass lesion/mass effect. Sequela of moderate chronic microvascular ischemic change. Generalized volume loss. There is a subtle region of loss gray-white differentiation right occipital lobe, which may be artifactual or represent a site of age indeterminate infarct. Vascular: No  hyperdense vessel or unexpected calcification. Skull: Soft tissue stranding in the periorbital soft tissues on the left with a small hematoma. No evidence of an underlying calvarial fracture. Sinuses/Orbits: Middle ear or mastoid effusion. Near-complete opacification of the right maxillary sinus with osseous changes suggestive of chronic right maxillary sinusitis. Bilateral lens replacement. Orbits are otherwise unremarkable. Other: None. CT CERVICAL SPINE FINDINGS Alignment: Trace retrolisthesis of C5 on C6, slightly progressed compared to 2018. Skull base and vertebrae: No acute fracture. No primary bone lesion or focal pathologic process. Soft tissues and spinal canal: No prevertebral fluid or swelling. No visible canal hematoma. Disc levels:  No evidence of high-grade spinal canal stenosis. Upper chest: Negative. Other: 0.5 cm hypodense left thyroid nodule, unchanged compared to 2021. IMPRESSION: 1. Soft tissue injury to the periorbital soft tissues on the left. No evidence of underlying calvarial fracture or intracranial injury. 2. Subtle region of loss gray-white differentiation in the right occipital lobe may be artifactual in the setting of significant motion artifact or represent a site of and age indeterminate infarct. If there is clinical concern for acute infarct, consider further evaluation with MRI. 3. No acute cervical spine fracture. Electronically Signed   By: Lorenza Cambridge M.D.   On: 02/10/2023 10:14   CT Cervical Spine Wo Contrast  Result Date: 02/10/2023 CLINICAL DATA:  Head trauma, moderate-severe; Neck trauma (Age >= 65y) EXAM: CT HEAD WITHOUT CONTRAST CT CERVICAL SPINE WITHOUT  CONTRAST TECHNIQUE: Multidetector CT imaging of the head and cervical spine was performed following the standard protocol without intravenous contrast. Multiplanar CT image reconstructions of the cervical spine were also generated. RADIATION DOSE REDUCTION: This exam was performed according to the departmental  dose-optimization program which includes automated exposure control, adjustment of the mA and/or kV according to patient size and/or use of iterative reconstruction technique. COMPARISON:  Cervical spine radiograph 05/19/2017 FINDINGS: CT HEAD FINDINGS Limitations: Motion degraded exam. Brain: No evidence of hemorrhage, hydrocephalus, extra-axial collection or mass lesion/mass effect. Sequela of moderate chronic microvascular ischemic change. Generalized volume loss. There is a subtle region of loss gray-white differentiation right occipital lobe, which may be artifactual or represent a site of age indeterminate infarct. Vascular: No hyperdense vessel or unexpected calcification. Skull: Soft tissue stranding in the periorbital soft tissues on the left with a small hematoma. No evidence of an underlying calvarial fracture. Sinuses/Orbits: Middle ear or mastoid effusion. Near-complete opacification of the right maxillary sinus with osseous changes suggestive of chronic right maxillary sinusitis. Bilateral lens replacement. Orbits are otherwise unremarkable. Other: None. CT CERVICAL SPINE FINDINGS Alignment: Trace retrolisthesis of C5 on C6, slightly progressed compared to 2018. Skull base and vertebrae: No acute fracture. No primary bone lesion or focal pathologic process. Soft tissues and spinal canal: No prevertebral fluid or swelling. No visible canal hematoma. Disc levels:  No evidence of high-grade spinal canal stenosis. Upper chest: Negative. Other: 0.5 cm hypodense left thyroid nodule, unchanged compared to 2021. IMPRESSION: 1. Soft tissue injury to the periorbital soft tissues on the left. No evidence of underlying calvarial fracture or intracranial injury. 2. Subtle region of loss gray-white differentiation in the right occipital lobe may be artifactual in the setting of significant motion artifact or represent a site of and age indeterminate infarct. If there is clinical concern for acute infarct, consider  further evaluation with MRI. 3. No acute cervical spine fracture. Electronically Signed   By: Lorenza Cambridge M.D.   On: 02/10/2023 10:14   DG Pelvis Portable  Result Date: 02/10/2023 CLINICAL DATA:  Fall EXAM: PORTABLE PELVIS 1 VIEWS COMPARISON:  None Available. FINDINGS: No evidence of pelvic fracture or diastasis. Mild degenerative changes of the bilateral hips. Degenerative changes of the pubic symphysis and partially visualized lumbar spine. Diffuse demineralization. No pelvic bone lesions are seen. IMPRESSION: No evidence of displaced fracture. Diffuse demineralization limits evaluation for nondisplaced fracture, if there is continued clinical concern recommend further evaluation with cross-sectional imaging. Electronically Signed   By: Allegra Lai M.D.   On: 02/10/2023 09:23   DG Chest Portable 1 View  Result Date: 02/10/2023 CLINICAL DATA:  Status post fall. EXAM: PORTABLE CHEST 1 VIEW COMPARISON:  Dec 25, 2022 FINDINGS: Calcific atherosclerotic disease and tortuosity of the aorta. Cardiomediastinal silhouette is normal. Mediastinal contours appear intact. There is no evidence of focal airspace consolidation, pleural effusion or pneumothorax. Chronic elevation of the left hemidiaphragm with large hiatal hernia. Osseous structures are without acute abnormality. Soft tissues are grossly normal. IMPRESSION: No active disease. Electronically Signed   By: Ted Mcalpine M.D.   On: 02/10/2023 09:19    Micro Results    Recent Results (from the past 240 hour(s))  Urine Culture     Status: Abnormal   Collection Time: 02/11/23  5:13 AM   Specimen: Urine, Random  Result Value Ref Range Status   Specimen Description URINE, RANDOM  Final   Special Requests   Final    NONE Reflexed from [id number] Performed at  [facility], [address] [place], [place] [address]    Culture >=100,000 COLONIES/mL ESCHERICHIA COLI (A)  Final   Report Status 02/13/2023 FINAL  Final   Organism ID,  Bacteria ESCHERICHIA COLI (A)  Final      Susceptibility   Escherichia coli - MIC*    AMPICILLIN <=2 SENSITIVE Sensitive     CEFAZOLIN <=4 SENSITIVE Sensitive     CEFEPIME <=0.12 SENSITIVE Sensitive     CEFTRIAXONE <=0.25 SENSITIVE Sensitive     CIPROFLOXACIN >=4 RESISTANT Resistant     GENTAMICIN <=1 SENSITIVE Sensitive     IMIPENEM <=0.25 SENSITIVE Sensitive     NITROFURANTOIN <=16 SENSITIVE Sensitive     TRIMETH/SULFA >=320 RESISTANT Resistant     AMPICILLIN/SULBACTAM <=2 SENSITIVE Sensitive     PIP/TAZO <=4 SENSITIVE Sensitive     * >=100,000 COLONIES/mL ESCHERICHIA COLI    Today   Subjective    Jill Snow today has no headache,no chest abdominal pain,no new weakness tingling or numbness, feels much better wants to go home today.    Objective   Blood pressure (!) 119/52, pulse 77, temperature 98.3 F (36.8 C), temperature source Oral, resp. rate 18, height 5\' 8"  (1.727 m), weight 68 kg, SpO2 99 %.   Intake/Output Summary (Last 24 hours) at 02/14/2023 1008 Last data filed at 02/14/2023 0944 Gross per 24 hour  Intake 480 ml  Output --  Net 480 ml    Exam  Awake but minimally confused, oriented x 2 no new F.N deficits,    [place]andler.AT, right-sided facial bruise stable Supple Neck,   Symmetrical Chest wall movement, Good air movement bilaterally, CTAB RRR,No Gallops,   +ve B.Sounds, Abd Soft, Non tender,  No Cyanosis, Clubbing or edema    Data Review   Recent Labs  Lab 02/10/23 0927 02/11/23 0557 02/12/23 0808 02/13/23 0317 02/14/23 0420  WBC 15.0* 4.6 6.5 5.1 5.8  HGB 10.6* 10.9* 10.7* 10.4* 10.1*  HCT 32.4* 33.6* 32.9* 32.4* 31.2*  PLT 258 229 245 232 224  MCV 99.4 99.1 98.8 99.4 99.7  MCH 32.5 32.2 32.1 31.9 32.3  MCHC 32.7 32.4 32.5 32.1 32.4  RDW 12.4 12.5 12.6 12.8 12.9  LYMPHSABS 0.6*  --   --   --   --   MONOABS 1.2*  --   --   --   --   EOSABS 0.4  --   --   --   --   BASOSABS 0.1  --   --   --   --     Recent Labs  Lab 02/10/23 0927  02/11/23 0557  NA 138 139  K 3.9 3.7  CL 99 104  CO2 25 27  ANIONGAP 14 8  GLUCOSE 115* 96  BUN 16 11  CREATININE 0.98 0.89  AST 26  --   ALT 24  --   ALKPHOS 64  --   BILITOT 2.2*  --   ALBUMIN 3.3*  --   CALCIUM 9.3 9.0    Total Time in preparing paper work, data evaluation and todays exam - 35 minutes  Signature  -    Susa Raring M.D on 02/14/2023 at 10:08 AM   -  To page go to [email or web address]

## 2023-02-15 ENCOUNTER — Telehealth: Payer: Self-pay | Admitting: *Deleted

## 2023-02-15 NOTE — Transitions of Care (Post Inpatient/ED Visit) (Signed)
   02/15/2023  Name: Jill Snow MRN: [id number] DOB: 20-Jul-1934  Today's TOC FU Call Status: Today's TOC FU Call Status:: Unsuccessul Call (1st Attempt) Unsuccessful Call (1st Attempt) Date: 02/15/23  Attempted to reach the patient regarding the most recent Inpatient/ED visit.  Follow Up Plan: Additional outreach attempts will be made to reach the patient to complete the Transitions of Care (Post Inpatient/ED visit) call.   Gean Maidens BSN RN [facility] [phone]

## 2023-02-17 ENCOUNTER — Telehealth: Payer: Self-pay | Admitting: *Deleted

## 2023-02-17 NOTE — Transitions of Care (Post Inpatient/ED Visit) (Signed)
   02/17/2023  Name: Jill Snow MRN: [id number] DOB: 31-Oct-1933  Today's TOC FU Call Status: Today's TOC FU Call Status:: Unsuccessful Call (2nd Attempt) Unsuccessful Call (2nd Attempt) Date: 02/17/23  Attempted to reach the patient regarding the most recent Inpatient/ED visit.  Follow Up Plan: Additional outreach attempts will be made to reach the patient to complete the Transitions of Care (Post Inpatient/ED visit) call.   Gean Maidens BSN RN [facility] [phone]

## 2023-02-19 ENCOUNTER — Telehealth: Payer: Self-pay | Admitting: *Deleted

## 2023-02-19 DIAGNOSIS — I110 Hypertensive heart disease with heart failure: Secondary | ICD-10-CM | POA: Diagnosis not present

## 2023-02-19 DIAGNOSIS — S7001XD Contusion of right hip, subsequent encounter: Secondary | ICD-10-CM | POA: Diagnosis not present

## 2023-02-19 DIAGNOSIS — I480 Paroxysmal atrial fibrillation: Secondary | ICD-10-CM | POA: Diagnosis not present

## 2023-02-19 DIAGNOSIS — F05 Delirium due to known physiological condition: Secondary | ICD-10-CM | POA: Diagnosis not present

## 2023-02-19 DIAGNOSIS — F03918 Unspecified dementia, unspecified severity, with other behavioral disturbance: Secondary | ICD-10-CM | POA: Diagnosis not present

## 2023-02-19 DIAGNOSIS — I5033 Acute on chronic diastolic (congestive) heart failure: Secondary | ICD-10-CM | POA: Diagnosis not present

## 2023-02-19 DIAGNOSIS — T45515D Adverse effect of anticoagulants, subsequent encounter: Secondary | ICD-10-CM | POA: Diagnosis not present

## 2023-02-19 DIAGNOSIS — D62 Acute posthemorrhagic anemia: Secondary | ICD-10-CM | POA: Diagnosis not present

## 2023-02-19 DIAGNOSIS — I4510 Unspecified right bundle-branch block: Secondary | ICD-10-CM | POA: Diagnosis not present

## 2023-02-19 NOTE — Transitions of Care (Post Inpatient/ED Visit) (Signed)
02/19/2023  Name: Jill Snow MRN: [id number] DOB: [birth year]  Today's TOC FU Call Status: Today's TOC FU Call Status:: Successful TOC FU Call Competed TOC FU Call Complete Date: 02/19/23  Transition Care Management Follow-up Telephone Call Date of Discharge: 02/14/23 Discharge Facility: [facility]) Type of Discharge: Inpatient Admission Primary Inpatient Discharge Diagnosis:: iliopsoa hematoma How have you been since you were released from the hospital?: Better Any questions or concerns?: No  Items Reviewed: Did you receive and understand the discharge instructions provided?: Yes Medications obtained,verified, and reconciled?: Yes (Medications Reviewed) Any new allergies since your discharge?: No Dietary orders reviewed?: No Do you have support at home?: Yes People in Home: child(ren), adult Name of Support/Comfort Primary Source: Evelina Dun and Marylene Land (children )  Medications Reviewed Today: Medications Reviewed Today     Reviewed by Luella Cook, RN (Case Manager) on 02/19/23 at 1124  Med List Status: <None>   Medication Order Taking? Sig Documenting Provider Last Dose Status Informant  acetaminophen (TYLENOL) 650 MG CR tablet [id number] Yes Take 1,300 mg by mouth every 8 (eight) hours as needed for pain. [provider] Taking Active Child  amiodarone (PACERONE) 200 MG tablet [id number] Yes Take 1 tablet (200 mg total) by mouth daily. Antonieta Iba, MD Taking Active Child  atorvastatin (LIPITOR) 40 MG tablet [id number] Yes Take 1 tablet (40 mg total) by mouth daily. Glori Luis, MD Taking Active Child  Cholecalciferol (VITAMIN D-3 PO) [id number] Yes Take 1 capsule by mouth at bedtime. [provider] Taking Active Child  ELIQUIS 5 MG TABS tablet [id number] Yes TAKE 1 TABLET BY MOUTH TWICE A DAY  Patient taking differently: Take 5 mg by mouth in the morning and at bedtime.   Antonieta Iba, MD Taking Active Child  furosemide  (LASIX) 20 MG tablet [id number] Yes TAKE 1 TABLET DAILY. MAY TAKE 1 EXTRA TABLET DAILY AS NEEDED FOR WEIGHT GAIN OF 2 POUNDS OVERNIGHT OR 5 POUNDS IN 1 WEEK  Patient taking differently: Take 20 mg by mouth daily.   Antonieta Iba, MD Taking Active Child  haloperidol (HALDOL) 2 MG tablet [id number] Yes Take 1 tablet (2 mg total) by mouth 2 (two) times daily as needed for agitation. Leroy Sea, MD Taking Active   ibuprofen (ADVIL) 200 MG tablet [id number] Yes Take 600 mg by mouth every 6 (six) hours as needed for moderate pain. [provider] Taking Active Child  montelukast (SINGULAIR) 10 MG tablet [id number] Yes Take 1 tablet (10 mg total) by mouth daily.  Patient taking differently: Take 10 mg by mouth at bedtime.   Glori Luis, MD Taking Active Child  pantoprazole (PROTONIX) 40 MG tablet [id number] Yes Take 1 tablet (40 mg total) by mouth daily. Barnetta Chapel, MD Taking Active Child  polyethylene glycol (MIRALAX / GLYCOLAX) 17 g packet [id number] No Take 17 g by mouth daily as needed for mild constipation.  Patient not taking: Reported on 02/10/2023   Barnetta Chapel, MD Not Taking Active Child  potassium chloride SA (KLOR-CON M20) 20 MEQ tablet [id number] Yes TAKE 1 TABLET BY MOUTH EVERY DAY  Patient taking differently: Take 20 mEq by mouth at bedtime.   Antonieta Iba, MD Taking Active Child  VITAMIN E PO [id number] Yes Take 1 capsule by mouth daily. [provider] Taking Active Child            Home Care and Equipment/Supplies: Were Home Health Services Ordered?: Yes Name of Home Health  Agency:: [facility] Has Agency set up a time to come to your home?: Yes First Home Health Visit Date: 02/15/23 Any new equipment or medical supplies ordered?: Yes Name of Medical supply agency?: [facility] Were you able to get the equipment/medical supplies?: Yes (insurance didn't cover [facility] and shower chair. Family purchased .) Do you have any questions related to  the use of the equipment/supplies?: No  Functional Questionnaire: Do you need assistance with bathing/showering or dressing?: Yes Do you need assistance with meal preparation?: Yes Do you need assistance with eating?: No Do you have difficulty maintaining continence: No Do you need assistance with getting out of bed/getting out of a chair/moving?: No Do you have difficulty managing or taking your medications?: Yes (daughter Marylene Land fixes nedication box)  Follow up appointments reviewed: PCP Follow-up appointment confirmed?: Yes Date of PCP follow-up appointment?: 02/26/23 Follow-up Provider: [facility] Follow-up appointment confirmed?: Yes Date of Specialist follow-up appointment?: 02/22/23 Follow-Up Specialty Provider:: [id number] [facility], [id number] Dr Brion Aliment Do you need transportation to your follow-up appointment?: No Do you understand care options if your condition(s) worsen?: Yes-patient verbalized understanding  SDOH Interventions Today    Flowsheet Row Most Recent Value  SDOH Interventions   Food Insecurity Interventions Intervention Not Indicated  Housing Interventions Intervention Not Indicated  Transportation Interventions Intervention Not Indicated, Patient Resources (Friends/Family)       Interventions Today    Flowsheet Row Most Recent Value  General Interventions   General Interventions Discussed/Reviewed General Interventions Discussed, General Interventions Reviewed, Doctor Visits  Exercise Interventions   Exercise Discussed/Reviewed Exercise Discussed  [PT/OT/ patient is ambulating with walker around house]  Pharmacy Interventions   Pharmacy Dicussed/Reviewed Pharmacy Topics Discussed  [Daughter Marylene Land fixes medications]        TOC Interventions Today    Flowsheet Row Most Recent Value  TOC Interventions   TOC Interventions Discussed/Reviewed TOC Interventions Discussed, TOC Interventions Reviewed  Vinie Sill switch off taking  care of mother so that someone is there all the time.]      Gean Maidens BSN RN [facility] [phone]

## 2023-02-25 ENCOUNTER — Ambulatory Visit: Payer: Medicare HMO | Admitting: Nurse Practitioner

## 2023-02-26 ENCOUNTER — Ambulatory Visit (INDEPENDENT_AMBULATORY_CARE_PROVIDER_SITE_OTHER): Payer: Medicare HMO | Admitting: Family Medicine

## 2023-02-26 ENCOUNTER — Encounter: Payer: Self-pay | Admitting: Family Medicine

## 2023-02-26 VITALS — BP 122/74 | HR 65 | Temp 97.6°F | Ht 68.0 in | Wt 153.2 lb

## 2023-02-26 DIAGNOSIS — S301XXA Contusion of abdominal wall, initial encounter: Secondary | ICD-10-CM | POA: Diagnosis not present

## 2023-02-26 DIAGNOSIS — I5033 Acute on chronic diastolic (congestive) heart failure: Secondary | ICD-10-CM | POA: Diagnosis not present

## 2023-02-26 DIAGNOSIS — S7001XD Contusion of right hip, subsequent encounter: Secondary | ICD-10-CM | POA: Diagnosis not present

## 2023-02-26 DIAGNOSIS — N3001 Acute cystitis with hematuria: Secondary | ICD-10-CM | POA: Diagnosis not present

## 2023-02-26 DIAGNOSIS — I110 Hypertensive heart disease with heart failure: Secondary | ICD-10-CM | POA: Diagnosis not present

## 2023-02-26 DIAGNOSIS — R4781 Slurred speech: Secondary | ICD-10-CM | POA: Diagnosis not present

## 2023-02-26 DIAGNOSIS — F03918 Unspecified dementia, unspecified severity, with other behavioral disturbance: Secondary | ICD-10-CM | POA: Diagnosis not present

## 2023-02-26 DIAGNOSIS — D62 Acute posthemorrhagic anemia: Secondary | ICD-10-CM | POA: Diagnosis not present

## 2023-02-26 DIAGNOSIS — I480 Paroxysmal atrial fibrillation: Secondary | ICD-10-CM

## 2023-02-26 DIAGNOSIS — I4510 Unspecified right bundle-branch block: Secondary | ICD-10-CM | POA: Diagnosis not present

## 2023-02-26 DIAGNOSIS — W19XXXA Unspecified fall, initial encounter: Secondary | ICD-10-CM | POA: Diagnosis not present

## 2023-02-26 DIAGNOSIS — T45515D Adverse effect of anticoagulants, subsequent encounter: Secondary | ICD-10-CM | POA: Diagnosis not present

## 2023-02-26 DIAGNOSIS — F05 Delirium due to known physiological condition: Secondary | ICD-10-CM | POA: Diagnosis not present

## 2023-02-26 LAB — COMPREHENSIVE METABOLIC PANEL
ALT: 22 U/L (ref 0–35)
AST: 24 U/L (ref 0–37)
Albumin: 4 g/dL (ref 3.5–5.2)
Alkaline Phosphatase: 74 U/L (ref 39–117)
BUN: 18 mg/dL (ref 6–23)
CO2: 31 mEq/L (ref 19–32)
Calcium: 9.5 mg/dL (ref 8.4–10.5)
Chloride: 100 mEq/L (ref 96–112)
Creatinine, Ser: 0.88 mg/dL (ref 0.40–1.20)
GFR: 59.15 mL/min — ABNORMAL LOW (ref >60.00)
Glucose, Bld: 99 mg/dL (ref 70–99)
Potassium: 3.5 mEq/L (ref 3.5–5.1)
Sodium: 139 mEq/L (ref 135–145)
Total Bilirubin: 0.9 mg/dL (ref 0.2–1.2)
Total Protein: 7.3 g/dL (ref 6.0–8.3)

## 2023-02-26 LAB — CBC
HCT: 36.8 % (ref 36.0–46.0)
Hemoglobin: 12.2 g/dL (ref 12.0–15.0)
MCHC: 33.3 g/dL (ref 30.0–36.0)
MCV: 99.3 fl (ref 78.0–100.0)
Platelets: 246 10*3/uL (ref 150.0–400.0)
RBC: 3.71 Mil/uL — ABNORMAL LOW (ref 3.87–5.11)
RDW: 13.9 % (ref 11.5–15.5)
WBC: 5.8 10*3/uL (ref 4.0–10.5)

## 2023-02-26 LAB — TSH: TSH: 2.74 u[IU]/mL (ref 0.35–5.50)

## 2023-02-26 NOTE — Assessment & Plan Note (Signed)
Onset after recent hospitalization.  Discussed that this could represent having had a stroke versus could be related to urine infection that was not treated for long enough.  We will recheck urinalysis today.  We will order an MRI to be done in an open MRI if possible.  If patient has any worsening symptoms or new symptoms they will seek medical attention in the emergency department.

## 2023-02-26 NOTE — Assessment & Plan Note (Signed)
Discussed that should progressively improve.  They can continue over-the-counter Tylenol for discomfort.  Advised avoiding NSAIDs given that she is on Eliquis.  If she has worsening discomfort they will be reevaluated.

## 2023-02-26 NOTE — Progress Notes (Signed)
Marikay Alar, MD Phone: [phone]  Jill Snow is a 89 y.o. female who presents today for follow-up.  Fall/iliopsoas hematoma: Patient was hospitalized after an unwitnessed fall that resulted in right hip pain.  She was subsequently found to have a right iliopsoas muscle hematoma.  Her daughter notes her Eliquis was held in the acute phase though was restarted after a risk-benefit discussion with the hospitalist.  They also report a second fall that occurred before the patient ended up in the hospital.  She fell and hit her head.  She had a CT head and cervical spine that did not reveal any intracranial bleeding.  The CT did reveal a subtle region in the right occipital lobe that could have been artifactual or could represent a site of age-indeterminate infarct.  Home health physical therapy is going to come out today to start treatment.  The patient has gotten a walker, shower chair, and toilet seat.  They note her right hip does still bother her some and they have been giving her some over-the-counter Tylenol.  Initially they were alternating Tylenol with Advil.  The patient's daughter reports that while the patient was in the hospital she became possibly agitated and was trying to get home so they ended up restraining her and giving her medication to sedate her.  Patient's daughter reports she has been different since discharge.  They report some slurring of her words since being in the hospital.  She is slower to respond to prompts.  She was treated for a UTI and they wonder if this could be related to that or if it could be related to a stroke.  She did not end up taking any Haldol after discharge.  Social History   Tobacco Use  Smoking Status Never  Smokeless Tobacco Never    Current Outpatient Medications on File Prior to Visit  Medication Sig Dispense Refill   acetaminophen (TYLENOL) 650 MG CR tablet Take 1,300 mg by mouth every 8 (eight) hours as needed for pain.      amiodarone (PACERONE) 200 MG tablet Take 1 tablet (200 mg total) by mouth daily. 90 tablet 3   atorvastatin (LIPITOR) 40 MG tablet Take 1 tablet (40 mg total) by mouth daily. 90 tablet 1   Cholecalciferol (VITAMIN D-3 PO) Take 1 capsule by mouth at bedtime.     ELIQUIS 5 MG TABS tablet TAKE 1 TABLET BY MOUTH TWICE A DAY (Patient taking differently: Take 5 mg by mouth in the morning and at bedtime.) 60 tablet 5   furosemide (LASIX) 20 MG tablet TAKE 1 TABLET DAILY. MAY TAKE 1 EXTRA TABLET DAILY AS NEEDED FOR WEIGHT GAIN OF 2 POUNDS OVERNIGHT OR 5 POUNDS IN 1 WEEK (Patient taking differently: Take 20 mg by mouth daily.) 90 tablet 3   ibuprofen (ADVIL) 200 MG tablet Take 600 mg by mouth every 6 (six) hours as needed for moderate pain.     montelukast (SINGULAIR) 10 MG tablet Take 1 tablet (10 mg total) by mouth daily. (Patient taking differently: Take 10 mg by mouth at bedtime.) 90 tablet 1   pantoprazole (PROTONIX) 40 MG tablet Take 1 tablet (40 mg total) by mouth daily. 30 tablet 0   polyethylene glycol (MIRALAX / GLYCOLAX) 17 g packet Take 17 g by mouth daily as needed for mild constipation. 14 each 0   potassium chloride SA (KLOR-CON M20) 20 MEQ tablet TAKE 1 TABLET BY MOUTH EVERY DAY (Patient taking differently: Take 20 mEq by mouth at bedtime.) 90  tablet 2   VITAMIN E PO Take 1 capsule by mouth daily.     No current facility-administered medications on file prior to visit.     ROS see history of present illness  Objective  Physical Exam Vitals:   02/26/23 1045  BP: 122/74  Pulse: 65  Temp: 97.6 F (36.4 C)  SpO2: 99%    BP Readings from Last 3 Encounters:  02/26/23 122/74  02/13/23 (!) 119/52  01/05/23 106/82   Wt Readings from Last 3 Encounters:  02/26/23 153 lb 3.2 oz (69.5 kg)  02/10/23 150 lb (68 kg)  01/05/23 148 lb 6.4 oz (67.3 kg)    Physical Exam Constitutional:      General: She is not in acute distress.    Appearance: She is not diaphoretic.  Cardiovascular:      Rate and Rhythm: Normal rate and regular rhythm.     Heart sounds: Normal heart sounds.  Pulmonary:     Effort: Pulmonary effort is normal.     Breath sounds: Normal breath sounds.  Musculoskeletal:     Comments: Right lateral hip and leg with no tenderness, slight bruise just above her right posterior hip  Skin:    General: Skin is warm and dry.  Neurological:     Mental Status: She is alert.     Comments: CN 3-12 intact, 5/5 strength in bilateral biceps, triceps, grip, quads, hamstrings, plantar and dorsiflexion, sensation to light touch intact in bilateral UE and LE, patient is slow to respond to prompts though does respond appropriately eventually      Assessment/Plan: Please see individual problem list.  Psoas hematoma, right, secondary to anticoagulant therapy, initial encounter Assessment & Plan: Discussed that should progressively improve.  They can continue over-the-counter Tylenol for discomfort.  Advised avoiding NSAIDs given that she is on Eliquis.  If she has worsening discomfort they will be reevaluated.   Slurred speech Assessment & Plan: Onset after recent hospitalization.  Discussed that this could represent having had a stroke versus could be related to urine infection that was not treated for long enough.  We will recheck urinalysis today.  We will order an MRI to be done in an open MRI if possible.  If patient has any worsening symptoms or new symptoms they will seek medical attention in the emergency department.  Orders: -     TSH -     CBC -     Comprehensive metabolic panel -     MR BRAIN WO CONTRAST; Future  Fall, initial encounter Assessment & Plan: Fall with iliopsoas hematoma and head injury.  She will complete physical therapy to help with strengthening and ambulation.   Paroxysmal atrial fibrillation (HCC) Assessment & Plan: Chronic issue.  Sinus rhythm today.  They have opted to continue with Eliquis after risk-benefit discussion with  hospitalist.  Discussed if she continues to have falls Eliquis would not be recommended moving forward.   Acute cystitis with hematuria Assessment & Plan: Recheck for UTI today with urinalysis and urine culture.  If positive for UTI would retreat.  Orders: -     Urine Culture -     POCT urinalysis dipstick; Future     Return for Has scheduled.  I have spent 41 minutes in the care of this patient regarding history taking, documentation, completion of exam, discussion of plan, placing orders, review of hospital discharge summary and CT head and neck.   Marikay Alar, MD [facility]

## 2023-02-26 NOTE — Assessment & Plan Note (Signed)
Fall with iliopsoas hematoma and head injury.  She will complete physical therapy to help with strengthening and ambulation.

## 2023-02-26 NOTE — Patient Instructions (Signed)
Nice to see you. If you have any worsening neurological symptoms or increasing pain please seek medical attention immediately.

## 2023-02-26 NOTE — Assessment & Plan Note (Signed)
Chronic issue.  Sinus rhythm today.  They have opted to continue with Eliquis after risk-benefit discussion with hospitalist.  Discussed if she continues to have falls Eliquis would not be recommended moving forward.

## 2023-02-26 NOTE — Assessment & Plan Note (Signed)
Recheck for UTI today with urinalysis and urine culture.  If positive for UTI would retreat.

## 2023-03-01 ENCOUNTER — Telehealth: Payer: Self-pay | Admitting: Family Medicine

## 2023-03-01 NOTE — Telephone Encounter (Signed)
Lft pt vm to call ofc regarding MRI. thanks 

## 2023-03-02 DIAGNOSIS — F03918 Unspecified dementia, unspecified severity, with other behavioral disturbance: Secondary | ICD-10-CM | POA: Diagnosis not present

## 2023-03-02 DIAGNOSIS — T45515D Adverse effect of anticoagulants, subsequent encounter: Secondary | ICD-10-CM | POA: Diagnosis not present

## 2023-03-02 DIAGNOSIS — I4510 Unspecified right bundle-branch block: Secondary | ICD-10-CM | POA: Diagnosis not present

## 2023-03-02 DIAGNOSIS — I480 Paroxysmal atrial fibrillation: Secondary | ICD-10-CM | POA: Diagnosis not present

## 2023-03-02 DIAGNOSIS — D62 Acute posthemorrhagic anemia: Secondary | ICD-10-CM | POA: Diagnosis not present

## 2023-03-02 DIAGNOSIS — I5033 Acute on chronic diastolic (congestive) heart failure: Secondary | ICD-10-CM | POA: Diagnosis not present

## 2023-03-02 DIAGNOSIS — S7001XD Contusion of right hip, subsequent encounter: Secondary | ICD-10-CM | POA: Diagnosis not present

## 2023-03-02 DIAGNOSIS — I110 Hypertensive heart disease with heart failure: Secondary | ICD-10-CM | POA: Diagnosis not present

## 2023-03-02 DIAGNOSIS — F05 Delirium due to known physiological condition: Secondary | ICD-10-CM | POA: Diagnosis not present

## 2023-03-04 DIAGNOSIS — I480 Paroxysmal atrial fibrillation: Secondary | ICD-10-CM | POA: Diagnosis not present

## 2023-03-04 DIAGNOSIS — D62 Acute posthemorrhagic anemia: Secondary | ICD-10-CM | POA: Diagnosis not present

## 2023-03-04 DIAGNOSIS — F05 Delirium due to known physiological condition: Secondary | ICD-10-CM | POA: Diagnosis not present

## 2023-03-04 DIAGNOSIS — I4510 Unspecified right bundle-branch block: Secondary | ICD-10-CM | POA: Diagnosis not present

## 2023-03-04 DIAGNOSIS — I5033 Acute on chronic diastolic (congestive) heart failure: Secondary | ICD-10-CM | POA: Diagnosis not present

## 2023-03-04 DIAGNOSIS — T45515D Adverse effect of anticoagulants, subsequent encounter: Secondary | ICD-10-CM | POA: Diagnosis not present

## 2023-03-04 DIAGNOSIS — S7001XD Contusion of right hip, subsequent encounter: Secondary | ICD-10-CM | POA: Diagnosis not present

## 2023-03-04 DIAGNOSIS — I110 Hypertensive heart disease with heart failure: Secondary | ICD-10-CM | POA: Diagnosis not present

## 2023-03-04 DIAGNOSIS — F03918 Unspecified dementia, unspecified severity, with other behavioral disturbance: Secondary | ICD-10-CM | POA: Diagnosis not present

## 2023-03-05 ENCOUNTER — Telehealth: Payer: Self-pay | Admitting: Family Medicine

## 2023-03-05 DIAGNOSIS — I4510 Unspecified right bundle-branch block: Secondary | ICD-10-CM | POA: Diagnosis not present

## 2023-03-05 DIAGNOSIS — I083 Combined rheumatic disorders of mitral, aortic and tricuspid valves: Secondary | ICD-10-CM

## 2023-03-05 DIAGNOSIS — I5033 Acute on chronic diastolic (congestive) heart failure: Secondary | ICD-10-CM | POA: Diagnosis not present

## 2023-03-05 DIAGNOSIS — S7001XD Contusion of right hip, subsequent encounter: Secondary | ICD-10-CM | POA: Diagnosis not present

## 2023-03-05 DIAGNOSIS — I480 Paroxysmal atrial fibrillation: Secondary | ICD-10-CM | POA: Diagnosis not present

## 2023-03-05 DIAGNOSIS — T45515D Adverse effect of anticoagulants, subsequent encounter: Secondary | ICD-10-CM | POA: Diagnosis not present

## 2023-03-05 DIAGNOSIS — Z87440 Personal history of urinary (tract) infections: Secondary | ICD-10-CM

## 2023-03-05 DIAGNOSIS — Z7901 Long term (current) use of anticoagulants: Secondary | ICD-10-CM

## 2023-03-05 DIAGNOSIS — I110 Hypertensive heart disease with heart failure: Secondary | ICD-10-CM | POA: Diagnosis not present

## 2023-03-05 DIAGNOSIS — Z9181 History of falling: Secondary | ICD-10-CM

## 2023-03-05 DIAGNOSIS — E785 Hyperlipidemia, unspecified: Secondary | ICD-10-CM

## 2023-03-05 DIAGNOSIS — F03918 Unspecified dementia, unspecified severity, with other behavioral disturbance: Secondary | ICD-10-CM | POA: Diagnosis not present

## 2023-03-05 DIAGNOSIS — F05 Delirium due to known physiological condition: Secondary | ICD-10-CM | POA: Diagnosis not present

## 2023-03-05 DIAGNOSIS — D62 Acute posthemorrhagic anemia: Secondary | ICD-10-CM | POA: Diagnosis not present

## 2023-03-05 NOTE — Telephone Encounter (Signed)
Home health sent a fax noting that the patient was taking ibuprofen. Please make sure the patient and her daughter know that she should not be taking ibuprofen or other NSAIDs given that she is on eliquis. Thanks.

## 2023-03-08 NOTE — Telephone Encounter (Signed)
Noted  

## 2023-03-08 NOTE — Telephone Encounter (Signed)
Pt daughter called back and I read the message to her she stated she was already told by the doctor about NSAIDS

## 2023-03-08 NOTE — Telephone Encounter (Signed)
Left message to call the office back regarding Dr. Sonnenberg's message below. 

## 2023-03-09 DIAGNOSIS — S7001XD Contusion of right hip, subsequent encounter: Secondary | ICD-10-CM | POA: Diagnosis not present

## 2023-03-09 DIAGNOSIS — I110 Hypertensive heart disease with heart failure: Secondary | ICD-10-CM | POA: Diagnosis not present

## 2023-03-09 DIAGNOSIS — F05 Delirium due to known physiological condition: Secondary | ICD-10-CM | POA: Diagnosis not present

## 2023-03-09 DIAGNOSIS — T45515D Adverse effect of anticoagulants, subsequent encounter: Secondary | ICD-10-CM | POA: Diagnosis not present

## 2023-03-09 DIAGNOSIS — D62 Acute posthemorrhagic anemia: Secondary | ICD-10-CM | POA: Diagnosis not present

## 2023-03-09 DIAGNOSIS — I5033 Acute on chronic diastolic (congestive) heart failure: Secondary | ICD-10-CM | POA: Diagnosis not present

## 2023-03-09 DIAGNOSIS — I4510 Unspecified right bundle-branch block: Secondary | ICD-10-CM | POA: Diagnosis not present

## 2023-03-09 DIAGNOSIS — F03918 Unspecified dementia, unspecified severity, with other behavioral disturbance: Secondary | ICD-10-CM | POA: Diagnosis not present

## 2023-03-09 DIAGNOSIS — I480 Paroxysmal atrial fibrillation: Secondary | ICD-10-CM | POA: Diagnosis not present

## 2023-03-11 DIAGNOSIS — I4510 Unspecified right bundle-branch block: Secondary | ICD-10-CM | POA: Diagnosis not present

## 2023-03-11 DIAGNOSIS — F05 Delirium due to known physiological condition: Secondary | ICD-10-CM | POA: Diagnosis not present

## 2023-03-11 DIAGNOSIS — I5033 Acute on chronic diastolic (congestive) heart failure: Secondary | ICD-10-CM | POA: Diagnosis not present

## 2023-03-11 DIAGNOSIS — I110 Hypertensive heart disease with heart failure: Secondary | ICD-10-CM | POA: Diagnosis not present

## 2023-03-11 DIAGNOSIS — D62 Acute posthemorrhagic anemia: Secondary | ICD-10-CM | POA: Diagnosis not present

## 2023-03-11 DIAGNOSIS — S7001XD Contusion of right hip, subsequent encounter: Secondary | ICD-10-CM | POA: Diagnosis not present

## 2023-03-11 DIAGNOSIS — I480 Paroxysmal atrial fibrillation: Secondary | ICD-10-CM | POA: Diagnosis not present

## 2023-03-11 DIAGNOSIS — F03918 Unspecified dementia, unspecified severity, with other behavioral disturbance: Secondary | ICD-10-CM | POA: Diagnosis not present

## 2023-03-11 DIAGNOSIS — T45515D Adverse effect of anticoagulants, subsequent encounter: Secondary | ICD-10-CM | POA: Diagnosis not present

## 2023-03-15 DIAGNOSIS — I5033 Acute on chronic diastolic (congestive) heart failure: Secondary | ICD-10-CM | POA: Diagnosis not present

## 2023-03-15 DIAGNOSIS — I480 Paroxysmal atrial fibrillation: Secondary | ICD-10-CM | POA: Diagnosis not present

## 2023-03-15 DIAGNOSIS — F05 Delirium due to known physiological condition: Secondary | ICD-10-CM | POA: Diagnosis not present

## 2023-03-15 DIAGNOSIS — I4510 Unspecified right bundle-branch block: Secondary | ICD-10-CM | POA: Diagnosis not present

## 2023-03-15 DIAGNOSIS — I110 Hypertensive heart disease with heart failure: Secondary | ICD-10-CM | POA: Diagnosis not present

## 2023-03-15 DIAGNOSIS — T45515D Adverse effect of anticoagulants, subsequent encounter: Secondary | ICD-10-CM | POA: Diagnosis not present

## 2023-03-15 DIAGNOSIS — D62 Acute posthemorrhagic anemia: Secondary | ICD-10-CM | POA: Diagnosis not present

## 2023-03-15 DIAGNOSIS — F03918 Unspecified dementia, unspecified severity, with other behavioral disturbance: Secondary | ICD-10-CM | POA: Diagnosis not present

## 2023-03-15 DIAGNOSIS — S7001XD Contusion of right hip, subsequent encounter: Secondary | ICD-10-CM | POA: Diagnosis not present

## 2023-03-16 DIAGNOSIS — T45515D Adverse effect of anticoagulants, subsequent encounter: Secondary | ICD-10-CM | POA: Diagnosis not present

## 2023-03-16 DIAGNOSIS — D62 Acute posthemorrhagic anemia: Secondary | ICD-10-CM | POA: Diagnosis not present

## 2023-03-16 DIAGNOSIS — I4510 Unspecified right bundle-branch block: Secondary | ICD-10-CM | POA: Diagnosis not present

## 2023-03-16 DIAGNOSIS — I110 Hypertensive heart disease with heart failure: Secondary | ICD-10-CM | POA: Diagnosis not present

## 2023-03-16 DIAGNOSIS — S7001XD Contusion of right hip, subsequent encounter: Secondary | ICD-10-CM | POA: Diagnosis not present

## 2023-03-16 DIAGNOSIS — I480 Paroxysmal atrial fibrillation: Secondary | ICD-10-CM | POA: Diagnosis not present

## 2023-03-16 DIAGNOSIS — F05 Delirium due to known physiological condition: Secondary | ICD-10-CM | POA: Diagnosis not present

## 2023-03-16 DIAGNOSIS — I5033 Acute on chronic diastolic (congestive) heart failure: Secondary | ICD-10-CM | POA: Diagnosis not present

## 2023-03-16 DIAGNOSIS — F03918 Unspecified dementia, unspecified severity, with other behavioral disturbance: Secondary | ICD-10-CM | POA: Diagnosis not present

## 2023-03-18 DIAGNOSIS — I5033 Acute on chronic diastolic (congestive) heart failure: Secondary | ICD-10-CM | POA: Diagnosis not present

## 2023-03-18 DIAGNOSIS — S7001XD Contusion of right hip, subsequent encounter: Secondary | ICD-10-CM | POA: Diagnosis not present

## 2023-03-18 DIAGNOSIS — I110 Hypertensive heart disease with heart failure: Secondary | ICD-10-CM | POA: Diagnosis not present

## 2023-03-18 DIAGNOSIS — D62 Acute posthemorrhagic anemia: Secondary | ICD-10-CM | POA: Diagnosis not present

## 2023-03-18 DIAGNOSIS — I4510 Unspecified right bundle-branch block: Secondary | ICD-10-CM | POA: Diagnosis not present

## 2023-03-18 DIAGNOSIS — T45515D Adverse effect of anticoagulants, subsequent encounter: Secondary | ICD-10-CM | POA: Diagnosis not present

## 2023-03-18 DIAGNOSIS — I480 Paroxysmal atrial fibrillation: Secondary | ICD-10-CM | POA: Diagnosis not present

## 2023-03-18 DIAGNOSIS — F03918 Unspecified dementia, unspecified severity, with other behavioral disturbance: Secondary | ICD-10-CM | POA: Diagnosis not present

## 2023-03-18 DIAGNOSIS — F05 Delirium due to known physiological condition: Secondary | ICD-10-CM | POA: Diagnosis not present

## 2023-03-22 ENCOUNTER — Ambulatory Visit
Admission: RE | Admit: 2023-03-22 | Discharge: 2023-03-22 | Disposition: A | Discharge status: Home / Self Care | Payer: Medicare HMO | Source: Ambulatory Visit | Attending: Family Medicine | Admitting: Family Medicine

## 2023-03-22 DIAGNOSIS — R29818 Other symptoms and signs involving the nervous system: Secondary | ICD-10-CM | POA: Diagnosis not present

## 2023-03-22 DIAGNOSIS — R4781 Slurred speech: Secondary | ICD-10-CM

## 2023-03-22 DIAGNOSIS — R4182 Altered mental status, unspecified: Secondary | ICD-10-CM | POA: Diagnosis not present

## 2023-03-25 DIAGNOSIS — F05 Delirium due to known physiological condition: Secondary | ICD-10-CM | POA: Diagnosis not present

## 2023-03-25 DIAGNOSIS — D62 Acute posthemorrhagic anemia: Secondary | ICD-10-CM | POA: Diagnosis not present

## 2023-03-25 DIAGNOSIS — F03918 Unspecified dementia, unspecified severity, with other behavioral disturbance: Secondary | ICD-10-CM | POA: Diagnosis not present

## 2023-03-25 DIAGNOSIS — T45515D Adverse effect of anticoagulants, subsequent encounter: Secondary | ICD-10-CM | POA: Diagnosis not present

## 2023-03-25 DIAGNOSIS — I5033 Acute on chronic diastolic (congestive) heart failure: Secondary | ICD-10-CM | POA: Diagnosis not present

## 2023-03-25 DIAGNOSIS — I110 Hypertensive heart disease with heart failure: Secondary | ICD-10-CM | POA: Diagnosis not present

## 2023-03-25 DIAGNOSIS — S7001XD Contusion of right hip, subsequent encounter: Secondary | ICD-10-CM | POA: Diagnosis not present

## 2023-03-25 DIAGNOSIS — I4510 Unspecified right bundle-branch block: Secondary | ICD-10-CM | POA: Diagnosis not present

## 2023-03-25 DIAGNOSIS — I480 Paroxysmal atrial fibrillation: Secondary | ICD-10-CM | POA: Diagnosis not present

## 2023-04-01 ENCOUNTER — Telehealth: Payer: Self-pay

## 2023-04-01 ENCOUNTER — Encounter (INDEPENDENT_AMBULATORY_CARE_PROVIDER_SITE_OTHER): Payer: Self-pay

## 2023-04-01 DIAGNOSIS — D62 Acute posthemorrhagic anemia: Secondary | ICD-10-CM | POA: Diagnosis not present

## 2023-04-01 DIAGNOSIS — S7001XD Contusion of right hip, subsequent encounter: Secondary | ICD-10-CM | POA: Diagnosis not present

## 2023-04-01 DIAGNOSIS — T45515D Adverse effect of anticoagulants, subsequent encounter: Secondary | ICD-10-CM | POA: Diagnosis not present

## 2023-04-01 DIAGNOSIS — I480 Paroxysmal atrial fibrillation: Secondary | ICD-10-CM | POA: Diagnosis not present

## 2023-04-01 DIAGNOSIS — F03918 Unspecified dementia, unspecified severity, with other behavioral disturbance: Secondary | ICD-10-CM | POA: Diagnosis not present

## 2023-04-01 DIAGNOSIS — I110 Hypertensive heart disease with heart failure: Secondary | ICD-10-CM | POA: Diagnosis not present

## 2023-04-01 DIAGNOSIS — I4510 Unspecified right bundle-branch block: Secondary | ICD-10-CM | POA: Diagnosis not present

## 2023-04-01 DIAGNOSIS — I5033 Acute on chronic diastolic (congestive) heart failure: Secondary | ICD-10-CM | POA: Diagnosis not present

## 2023-04-01 DIAGNOSIS — F05 Delirium due to known physiological condition: Secondary | ICD-10-CM | POA: Diagnosis not present

## 2023-04-01 NOTE — Telephone Encounter (Signed)
Left a message on Marylene Land, daughter cell phone to call back for results.

## 2023-04-01 NOTE — Telephone Encounter (Signed)
-----   Message from Marikay Alar sent at 04/01/2023  2:59 PM EDT ----- Please let the patients daughter know that the MRI did not reveal a stroke. Has the patient had anymore slurred speech? She does have chronic sinusitis findings on the right side. Is she having any sinus issues?

## 2023-04-06 DIAGNOSIS — I5033 Acute on chronic diastolic (congestive) heart failure: Secondary | ICD-10-CM | POA: Diagnosis not present

## 2023-04-06 DIAGNOSIS — I4510 Unspecified right bundle-branch block: Secondary | ICD-10-CM | POA: Diagnosis not present

## 2023-04-06 DIAGNOSIS — S7001XD Contusion of right hip, subsequent encounter: Secondary | ICD-10-CM | POA: Diagnosis not present

## 2023-04-06 DIAGNOSIS — F03918 Unspecified dementia, unspecified severity, with other behavioral disturbance: Secondary | ICD-10-CM | POA: Diagnosis not present

## 2023-04-06 DIAGNOSIS — T45515D Adverse effect of anticoagulants, subsequent encounter: Secondary | ICD-10-CM | POA: Diagnosis not present

## 2023-04-06 DIAGNOSIS — I480 Paroxysmal atrial fibrillation: Secondary | ICD-10-CM | POA: Diagnosis not present

## 2023-04-06 DIAGNOSIS — I110 Hypertensive heart disease with heart failure: Secondary | ICD-10-CM | POA: Diagnosis not present

## 2023-04-06 DIAGNOSIS — D62 Acute posthemorrhagic anemia: Secondary | ICD-10-CM | POA: Diagnosis not present

## 2023-04-06 DIAGNOSIS — F05 Delirium due to known physiological condition: Secondary | ICD-10-CM | POA: Diagnosis not present

## 2023-04-12 NOTE — Telephone Encounter (Signed)
Spoke to Patient's daughter Misty Stanley and she states no more slurred speech. She states she does not think her Mom has any sinus issues but she will find out from her Mom and sister.

## 2023-04-13 NOTE — Telephone Encounter (Signed)
Noted.  Will await their call back on the sinus question.
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
## 2023-04-26 ENCOUNTER — Encounter: Payer: Self-pay | Admitting: Cardiovascular Disease

## 2023-04-26 ENCOUNTER — Ambulatory Visit: Payer: Medicare HMO | Attending: Nurse Practitioner | Admitting: Cardiovascular Disease

## 2023-04-26 VITALS — BP 120/72 | HR 62 | Ht 68.0 in | Wt 155.0 lb

## 2023-04-26 DIAGNOSIS — I428 Other cardiomyopathies: Secondary | ICD-10-CM

## 2023-04-26 DIAGNOSIS — G4733 Obstructive sleep apnea (adult) (pediatric): Secondary | ICD-10-CM | POA: Diagnosis not present

## 2023-04-26 DIAGNOSIS — I10 Essential (primary) hypertension: Secondary | ICD-10-CM

## 2023-04-26 DIAGNOSIS — I480 Paroxysmal atrial fibrillation: Secondary | ICD-10-CM | POA: Diagnosis not present

## 2023-04-26 DIAGNOSIS — I700 Atherosclerosis of aorta: Secondary | ICD-10-CM

## 2023-04-26 DIAGNOSIS — I2510 Atherosclerotic heart disease of native coronary artery without angina pectoris: Secondary | ICD-10-CM | POA: Diagnosis not present

## 2023-04-26 DIAGNOSIS — I5022 Chronic systolic (congestive) heart failure: Secondary | ICD-10-CM | POA: Diagnosis not present

## 2023-04-26 DIAGNOSIS — I4892 Unspecified atrial flutter: Secondary | ICD-10-CM

## 2023-04-26 DIAGNOSIS — E876 Hypokalemia: Secondary | ICD-10-CM

## 2023-04-26 DIAGNOSIS — E782 Mixed hyperlipidemia: Secondary | ICD-10-CM

## 2023-04-26 MED ORDER — APIXABAN 5 MG PO TABS: 5.0000 mg | ORAL_TABLET | Freq: Two times a day (BID) | ORAL | 3 refills | Status: DC

## 2023-04-26 MED ORDER — FUROSEMIDE 20 MG PO TABS: ORAL_TABLET | ORAL | 3 refills | Status: DC

## 2023-04-26 MED ORDER — POTASSIUM CHLORIDE CRYS ER 20 MEQ PO TBCR
20.0000 meq | EXTENDED_RELEASE_TABLET | Freq: Every day | ORAL | 3 refills | Status: DC
Start: 2023-04-26 — End: 2023-12-31

## 2023-04-26 MED ORDER — AMIODARONE HCL 200 MG PO TABS: 200.0000 mg | ORAL_TABLET | Freq: Every day | ORAL | 3 refills | Status: DC

## 2023-04-26 NOTE — Patient Instructions (Addendum)
Medication Instructions:  Potassium 20 Meq daily with an extra tablet at needed when taking extra lasix.   If you need a refill on your cardiac medications before your next appointment, please call your pharmacy.   Lab work: No new labs needed  Testing/Procedures: No new testing needed  Follow-Up: At [facility], you and your health needs are our priority.  As part of our continuing mission to provide you with exceptional heart care, we have created designated Provider Care Teams.  These Care Teams include your primary Cardiologist (physician) and Advanced Practice Providers (APPs -  Physician Assistants and Nurse Practitioners) who all work together to provide you with the care you need, when you need it.  You will need a follow up appointment in 12 months  Providers on your designated Care Team:   Nicolasa Ducking, NP Eula Listen, PA-C Cadence Fransico Michael, [place]  COVID-19 Vaccine Information can be found at: [email or web address] For questions related to vaccine distribution or appointments, please email [email or web address] or call [phone].

## 2023-05-01 ENCOUNTER — Telehealth: Payer: Medicare HMO | Admitting: Family Medicine

## 2023-05-01 DIAGNOSIS — L03114 Cellulitis of left upper limb: Secondary | ICD-10-CM | POA: Diagnosis not present

## 2023-05-01 MED ORDER — CEPHALEXIN 500 MG PO CAPS: 500.0000 mg | ORAL_CAPSULE | Freq: Three times a day (TID) | ORAL | 0 refills | Status: AC

## 2023-05-01 NOTE — Progress Notes (Signed)
E Visit for Cellulitis  We are sorry that you are not feeling well. Here is how we plan to help!  Based on what you shared with me it looks like you have cellulitis.  Cellulitis looks like areas of skin redness, swelling, and warmth; it develops as a result of bacteria entering under the skin. Little red spots and/or bleeding can be seen in skin, and tiny surface sacs containing fluid can occur. Fever can be present. Cellulitis is almost always on one side of a body, and the lower limbs are the most common site of involvement.   I have prescribed:  keflex TID for 10 days  HOME CARE:  Take your medications as ordered and take all of them, even if the skin irritation appears to be healing.   GET HELP RIGHT AWAY IF:  Symptoms that don't begin to go away within 48 hours. Severe redness persists or worsens If the area turns color, spreads or swells. If it blisters and opens, develops yellow-brown crust or bleeds. You develop a fever or chills. If the pain increases or becomes unbearable.  Are unable to keep fluids and food down.  MAKE SURE YOU   Understand these instructions. Will watch your condition. Will get help right away if you are not doing well or get worse.  Thank you for choosing an e-visit.   Your e-visit answers were reviewed by a board certified advanced clinical practitioner to complete your personal care plan. Depending upon the condition, your plan could have included both over the counter or prescription medications.  Please review your pharmacy choice. Make sure the pharmacy is open so you can pick up prescription now. If there is a problem, you may contact your provider through [employer] and have the prescription routed to another pharmacy.  Your safety is important to [name]. If you have drug allergies check your prescription carefully.   For the next 24 hours you can use MyChart to ask questions about today's visit, request a non-urgent call back, or ask for a  work or school excuse. You will get an email in the next two days asking about your experience. I hope that your e-visit has been valuable and will speed your recovery.    have provided 5 minutes of non face to face time during this encounter for chart review and documentation.

## 2023-05-14 ENCOUNTER — Ambulatory Visit: Payer: Medicare HMO | Admitting: Family Medicine

## 2023-07-08 ENCOUNTER — Other Ambulatory Visit: Payer: Self-pay | Admitting: Family Medicine

## 2023-07-08 DIAGNOSIS — E7800 Pure hypercholesterolemia, unspecified: Secondary | ICD-10-CM

## 2023-07-08 DIAGNOSIS — J309 Allergic rhinitis, unspecified: Secondary | ICD-10-CM

## 2023-08-05 ENCOUNTER — Other Ambulatory Visit: Payer: Self-pay

## 2023-08-05 ENCOUNTER — Encounter: Payer: Self-pay | Admitting: Cardiovascular Disease

## 2023-08-05 MED ORDER — OMEPRAZOLE 20 MG PO CPDR: 20.0000 mg | DELAYED_RELEASE_CAPSULE | Freq: Every day | ORAL | 11 refills | Status: AC

## 2023-08-23 ENCOUNTER — Encounter: Payer: Self-pay | Admitting: Family Medicine

## 2023-08-23 ENCOUNTER — Ambulatory Visit (INDEPENDENT_AMBULATORY_CARE_PROVIDER_SITE_OTHER): Payer: Medicare HMO | Admitting: Family Medicine

## 2023-08-23 VITALS — BP 112/70 | HR 77 | Temp 97.7°F | Ht 68.0 in | Wt 154.4 lb

## 2023-08-23 DIAGNOSIS — Z0001 Encounter for general adult medical examination with abnormal findings: Secondary | ICD-10-CM | POA: Diagnosis not present

## 2023-08-23 DIAGNOSIS — Z780 Asymptomatic menopausal state: Secondary | ICD-10-CM | POA: Diagnosis not present

## 2023-08-23 DIAGNOSIS — G301 Alzheimer's disease with late onset: Secondary | ICD-10-CM | POA: Diagnosis not present

## 2023-08-23 DIAGNOSIS — R7309 Other abnormal glucose: Secondary | ICD-10-CM | POA: Diagnosis not present

## 2023-08-23 DIAGNOSIS — Z7189 Other specified counseling: Secondary | ICD-10-CM | POA: Insufficient documentation

## 2023-08-23 DIAGNOSIS — F02818 Dementia in other diseases classified elsewhere, unspecified severity, with other behavioral disturbance: Secondary | ICD-10-CM

## 2023-08-23 MED ORDER — TRAZODONE HCL 50 MG PO TABS
25.0000 mg | ORAL_TABLET | Freq: Every evening | ORAL | 0 refills | Status: DC | PRN
Start: 2023-08-23 — End: 2023-12-10

## 2023-08-23 NOTE — Assessment & Plan Note (Addendum)
 Chronic issue.  Discussed trial of trazodone  25 mg nightly as needed for sleep and sundowning.  Will see if this is beneficial.  Discussed behavioral cues such as stop signs at doorways to help prevent her from wandering.  They will keep the alarms on their doors.  Discussed the potential for a memory care unit if family members are not able to handle providing care for her.  Discussed they would need to look into a memory care unit on their own.  Discussed this typically would cost them out-of-pocket until she met criteria for skilled nursing and then she may be able to get on Medicaid at that time.  Home health referral placed to have a social worker involved.
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
## 2023-08-23 NOTE — Assessment & Plan Note (Signed)
 Physical exam completed.  Encouraged remaining active.  Advised flu vaccine, Shingrix vaccine, and RSV vaccines are available if the patient's family changes their mind.  Encouraged her to see an eye doctor once yearly.  They will call to schedule her bone density scan.  Lab work as outlined.  Patient's daughter asked about power of attorney.  Given the patient's dementia I suggested that they go through a lawyer to get this put in place if they desired this.

## 2023-08-23 NOTE — Patient Instructions (Signed)
 We will try trazodone  to see if that helps with her nighttime symptoms.  If not beneficial please let us  know.  If you notice any excessive drowsiness or other side effects with this please discontinue use of it and let us  know.   Please call [phone] to schedule the bone density scan.

## 2023-08-24 ENCOUNTER — Telehealth: Payer: Self-pay

## 2023-08-24 LAB — COMPREHENSIVE METABOLIC PANEL
ALT: 18 U/L (ref 0–35)
AST: 22 U/L (ref 0–37)
Albumin: 4 g/dL (ref 3.5–5.2)
Alkaline Phosphatase: 76 U/L (ref 39–117)
BUN: 12 mg/dL (ref 6–23)
CO2: 30 meq/L (ref 19–32)
Calcium: 9.3 mg/dL (ref 8.4–10.5)
Chloride: 102 meq/L (ref 96–112)
Creatinine, Ser: 1 mg/dL (ref 0.40–1.20)
GFR: 50.56 mL/min — ABNORMAL LOW (ref >60.00)
Glucose, Bld: 73 mg/dL (ref 70–99)
Potassium: 4 meq/L (ref 3.5–5.1)
Sodium: 140 meq/L (ref 135–145)
Total Bilirubin: 1 mg/dL (ref 0.2–1.2)
Total Protein: 7.1 g/dL (ref 6.0–8.3)

## 2023-08-24 LAB — HEMOGLOBIN A1C: Hgb A1c MFr Bld: 5.7 % (ref 4.6–6.5)

## 2023-08-24 NOTE — Telephone Encounter (Signed)
 Vm left on daughter [name] phone to CB in regards to labs

## 2023-08-27 DIAGNOSIS — I110 Hypertensive heart disease with heart failure: Secondary | ICD-10-CM | POA: Diagnosis not present

## 2023-08-27 DIAGNOSIS — I5022 Chronic systolic (congestive) heart failure: Secondary | ICD-10-CM | POA: Diagnosis not present

## 2023-08-27 DIAGNOSIS — I4510 Unspecified right bundle-branch block: Secondary | ICD-10-CM | POA: Diagnosis not present

## 2023-08-27 DIAGNOSIS — F02818 Dementia in other diseases classified elsewhere, unspecified severity, with other behavioral disturbance: Secondary | ICD-10-CM | POA: Diagnosis not present

## 2023-08-27 DIAGNOSIS — I480 Paroxysmal atrial fibrillation: Secondary | ICD-10-CM | POA: Diagnosis not present

## 2023-08-27 DIAGNOSIS — F0284 Dementia in other diseases classified elsewhere, unspecified severity, with anxiety: Secondary | ICD-10-CM | POA: Diagnosis not present

## 2023-08-27 DIAGNOSIS — E2839 Other primary ovarian failure: Secondary | ICD-10-CM | POA: Diagnosis not present

## 2023-08-27 DIAGNOSIS — I4892 Unspecified atrial flutter: Secondary | ICD-10-CM | POA: Diagnosis not present

## 2023-08-27 DIAGNOSIS — G301 Alzheimer's disease with late onset: Secondary | ICD-10-CM | POA: Diagnosis not present

## 2023-09-02 DIAGNOSIS — F02818 Dementia in other diseases classified elsewhere, unspecified severity, with other behavioral disturbance: Secondary | ICD-10-CM | POA: Diagnosis not present

## 2023-09-02 DIAGNOSIS — G301 Alzheimer's disease with late onset: Secondary | ICD-10-CM | POA: Diagnosis not present

## 2023-09-02 DIAGNOSIS — I4892 Unspecified atrial flutter: Secondary | ICD-10-CM | POA: Diagnosis not present

## 2023-09-02 DIAGNOSIS — I4510 Unspecified right bundle-branch block: Secondary | ICD-10-CM | POA: Diagnosis not present

## 2023-09-02 DIAGNOSIS — F0284 Dementia in other diseases classified elsewhere, unspecified severity, with anxiety: Secondary | ICD-10-CM | POA: Diagnosis not present

## 2023-09-02 DIAGNOSIS — E2839 Other primary ovarian failure: Secondary | ICD-10-CM | POA: Diagnosis not present

## 2023-09-02 DIAGNOSIS — I5022 Chronic systolic (congestive) heart failure: Secondary | ICD-10-CM | POA: Diagnosis not present

## 2023-09-02 DIAGNOSIS — I110 Hypertensive heart disease with heart failure: Secondary | ICD-10-CM | POA: Diagnosis not present

## 2023-09-02 DIAGNOSIS — I480 Paroxysmal atrial fibrillation: Secondary | ICD-10-CM | POA: Diagnosis not present

## 2023-09-07 DIAGNOSIS — G301 Alzheimer's disease with late onset: Secondary | ICD-10-CM | POA: Diagnosis not present

## 2023-09-07 DIAGNOSIS — F0284 Dementia in other diseases classified elsewhere, unspecified severity, with anxiety: Secondary | ICD-10-CM | POA: Diagnosis not present

## 2023-09-07 DIAGNOSIS — I4892 Unspecified atrial flutter: Secondary | ICD-10-CM | POA: Diagnosis not present

## 2023-09-07 DIAGNOSIS — I5022 Chronic systolic (congestive) heart failure: Secondary | ICD-10-CM | POA: Diagnosis not present

## 2023-09-07 DIAGNOSIS — F02818 Dementia in other diseases classified elsewhere, unspecified severity, with other behavioral disturbance: Secondary | ICD-10-CM | POA: Diagnosis not present

## 2023-09-07 DIAGNOSIS — I480 Paroxysmal atrial fibrillation: Secondary | ICD-10-CM | POA: Diagnosis not present

## 2023-09-07 DIAGNOSIS — I110 Hypertensive heart disease with heart failure: Secondary | ICD-10-CM | POA: Diagnosis not present

## 2023-09-07 DIAGNOSIS — I4510 Unspecified right bundle-branch block: Secondary | ICD-10-CM | POA: Diagnosis not present

## 2023-09-07 DIAGNOSIS — E2839 Other primary ovarian failure: Secondary | ICD-10-CM | POA: Diagnosis not present

## 2023-09-15 DIAGNOSIS — I480 Paroxysmal atrial fibrillation: Secondary | ICD-10-CM | POA: Diagnosis not present

## 2023-09-15 DIAGNOSIS — G301 Alzheimer's disease with late onset: Secondary | ICD-10-CM | POA: Diagnosis not present

## 2023-09-15 DIAGNOSIS — F0284 Dementia in other diseases classified elsewhere, unspecified severity, with anxiety: Secondary | ICD-10-CM | POA: Diagnosis not present

## 2023-09-15 DIAGNOSIS — I5022 Chronic systolic (congestive) heart failure: Secondary | ICD-10-CM | POA: Diagnosis not present

## 2023-09-15 DIAGNOSIS — E2839 Other primary ovarian failure: Secondary | ICD-10-CM | POA: Diagnosis not present

## 2023-09-15 DIAGNOSIS — I110 Hypertensive heart disease with heart failure: Secondary | ICD-10-CM | POA: Diagnosis not present

## 2023-09-15 DIAGNOSIS — F02818 Dementia in other diseases classified elsewhere, unspecified severity, with other behavioral disturbance: Secondary | ICD-10-CM | POA: Diagnosis not present

## 2023-09-15 DIAGNOSIS — I4892 Unspecified atrial flutter: Secondary | ICD-10-CM | POA: Diagnosis not present

## 2023-09-15 DIAGNOSIS — I4510 Unspecified right bundle-branch block: Secondary | ICD-10-CM | POA: Diagnosis not present

## 2023-09-22 DIAGNOSIS — E2839 Other primary ovarian failure: Secondary | ICD-10-CM | POA: Diagnosis not present

## 2023-09-22 DIAGNOSIS — I480 Paroxysmal atrial fibrillation: Secondary | ICD-10-CM | POA: Diagnosis not present

## 2023-09-22 DIAGNOSIS — F02818 Dementia in other diseases classified elsewhere, unspecified severity, with other behavioral disturbance: Secondary | ICD-10-CM | POA: Diagnosis not present

## 2023-09-22 DIAGNOSIS — G301 Alzheimer's disease with late onset: Secondary | ICD-10-CM | POA: Diagnosis not present

## 2023-09-22 DIAGNOSIS — I110 Hypertensive heart disease with heart failure: Secondary | ICD-10-CM | POA: Diagnosis not present

## 2023-09-22 DIAGNOSIS — I4892 Unspecified atrial flutter: Secondary | ICD-10-CM | POA: Diagnosis not present

## 2023-09-22 DIAGNOSIS — I4510 Unspecified right bundle-branch block: Secondary | ICD-10-CM | POA: Diagnosis not present

## 2023-09-22 DIAGNOSIS — F0284 Dementia in other diseases classified elsewhere, unspecified severity, with anxiety: Secondary | ICD-10-CM | POA: Diagnosis not present

## 2023-09-22 DIAGNOSIS — I5022 Chronic systolic (congestive) heart failure: Secondary | ICD-10-CM | POA: Diagnosis not present

## 2023-09-27 ENCOUNTER — Ambulatory Visit
Admission: EM | Admit: 2023-09-27 | Discharge: 2023-09-27 | Disposition: A | Discharge status: Home / Self Care | Payer: Medicare HMO | Attending: Emergency Medicine | Admitting: Emergency Medicine

## 2023-09-27 DIAGNOSIS — J069 Acute upper respiratory infection, unspecified: Secondary | ICD-10-CM | POA: Diagnosis not present

## 2023-09-27 MED ORDER — BENZONATATE 100 MG PO CAPS: 100.0000 mg | ORAL_CAPSULE | Freq: Three times a day (TID) | ORAL | 0 refills | Status: DC

## 2023-09-27 MED ORDER — AZITHROMYCIN 250 MG PO TABS: 250.0000 mg | ORAL_TABLET | Freq: Every day | ORAL | 0 refills | Status: DC

## 2023-09-27 NOTE — ED Triage Notes (Signed)
 Patient presents to UC for cough and fatigue x 5 days. Home health nurses evaluated her and no abnormal findings. Treating cough with OTC cold/flu.

## 2023-09-27 NOTE — Discharge Instructions (Signed)
 Begin Azithromycin  which will prevent symptoms from ideally worsening of progressing more serious respiratory illness such as pneumonia  May give Tessalon  pill every 8 hours as needed for coughing, if the medicine is too expensive may give over-the-counter Delsym  You can take Tylenol  and/or Ibuprofen  as needed for fever reduction and pain relief.   For cough: honey 1/2 to 1 teaspoon (you can dilute the honey in water  or another fluid).  You can also use guaifenesin and dextromethorphan for cough. You can use a humidifier for chest congestion and cough.  If you don't have a humidifier, you can sit in the bathroom with the hot shower running.      For sore throat: try warm salt water  gargles, cepacol lozenges, throat spray, warm tea or water  with lemon/honey, popsicles or ice, or OTC cold relief medicine for throat discomfort.   For congestion: take a daily anti-histamine like Zyrtec, Claritin, and a oral decongestant, such as pseudoephedrine.  You can also use Flonase  1-2 sprays in each nostril daily.   It is important to stay hydrated: drink plenty of fluids (water , gatorade/powerade/pedialyte, juices, or teas) to keep your throat moisturized and help further relieve irritation/discomfort.

## 2023-09-27 NOTE — ED Provider Notes (Signed)
 Jill Snow    CSN: [id number] Arrival date & time: 09/27/23  1721      History   Chief Complaint Chief Complaint  Patient presents with   Cough    HPI Jill Snow is a 89 y.o. female.   Patient presents evaluation of increased fatigue, nasal congestion and a nonproductive 5 days.  Denies shortness of breath or wheezing.  Poor appetite but able to tolerate food and liquids.  Has been given \\Tylenol  and Tylenol  Cold and flu.  History obtained from daughter as patient has dementia.  Past Medical History:  Diagnosis Date   Atrial flutter (HCC)    a. s/p successful TEE/DCCV in 2014; b. TEE 2014 showed EF 45-50%, mild bi-atrial enlargement, mod MR   Dyspnea    History of chicken pox    Hypercholesterolemia    Hypertension    Mitral regurgitation    a. echo 2014: EF 40-45%, mildly dilated RV, mod reduced RV systolic fxn, mod dilated LA, Mild to mod MR, mod TR, mildly elevated PASP   PAF (paroxysmal atrial fibrillation) (HCC)    a. initial episode 2011; b. not on long term anticoagulation since 06/2013   RBBB (right bundle branch block)    Seasonal allergies    Sleep apnea    a. on CPAP   Thyroid  nodule 05/19/2017    Patient Active Problem List   Diagnosis Date Noted   Encounter for general adult medical examination with abnormal findings 08/23/2023   Slurred speech 02/26/2023   Fall 02/10/2023   Thrombocytopenia (HCC) 01/05/2023   UTI (urinary tract infection) 12/25/2022   AMS (altered mental status) 12/25/2022   Venous insufficiency 11/10/2022   Cellulitis 03/23/2022   Encounter for dental examination and cleaning without abnormal findings 09/17/2021   Late onset Alzheimer's dementia with behavioral disturbance (HCC) 06/03/2021   Urinary urgency 05/26/2021   Tremor of right hand 05/26/2021   Dry eyes 12/24/2020   Chronic systolic CHF (congestive heart failure) (HCC) 12/16/2020   Cellulitis of left arm 12/22/2019   History of non anemic vitamin B12  deficiency 06/27/2018   Aortic atherosclerosis (HCC) 04/06/2018   Allergic rhinitis 05/19/2017   Lung nodule 12/10/2016   Fatty liver 04/22/2016   Neuropathy 02/11/2016   Mitral regurgitation    Vitamin D  deficiency 08/29/2014   Obstructive sleep apnea on CPAP 03/07/2014   Atrial fibrillation (HCC)    Hypercholesterolemia    RBBB (right bundle branch block)     Past Surgical History:  Procedure Laterality Date   ABDOMINAL HYSTERECTOMY     TONSILLECTOMY AND ADENOIDECTOMY  1947   tummy tuck      OB History   No obstetric history on file.      Home Medications    Prior to Admission medications   Medication Sig Start Date End Date Taking? Authorizing Provider  azithromycin  (ZITHROMAX ) 250 MG tablet Take 1 tablet (250 mg total) by mouth daily. Take first 2 tablets together, then 1 every day until finished. 09/27/23  Yes Yee Joss R, NP  benzonatate  (TESSALON ) 100 MG capsule Take 1 capsule (100 mg total) by mouth every 8 (eight) hours. 09/27/23  Yes Walsie Smeltz R, NP  acetaminophen  (TYLENOL ) 650 MG CR tablet Take 1,300 mg by mouth every 8 (eight) hours as needed for pain.    [provider]  amiodarone  (PACERONE ) 200 MG tablet Take 1 tablet (200 mg total) by mouth daily. 04/26/23   Gollan, Timothy J, MD  apixaban  (ELIQUIS ) 5 MG TABS tablet  Take 1 tablet (5 mg total) by mouth 2 (two) times daily. 04/26/23   Gollan, Timothy J, MD  atorvastatin  (LIPITOR ) 40 MG tablet TAKE 1 TABLET BY MOUTH EVERY DAY 07/12/23   Kent Pear, MD  Cholecalciferol  (VITAMIN D -3 PO) Take 1 capsule by mouth at bedtime.    [provider]  furosemide  (LASIX ) 20 MG tablet TAKE 1 TABLET DAILY. MAY TAKE 1 EXTRA TABLET DAILY AS NEEDED FOR WEIGHT GAIN OF 2 POUNDS OVERNIGHT OR 5 POUNDS IN 1 WEEK 04/26/23   Gollan, Timothy J, MD  montelukast  (SINGULAIR ) 10 MG tablet TAKE 1 TABLET BY MOUTH EVERY DAY 07/12/23   Kent Pear, MD  omeprazole  (PRILOSEC) 20 MG capsule Take 1 capsule (20 mg  total) by mouth daily. 08/05/23   Gollan, Timothy J, MD  polyethylene glycol (MIRALAX  / GLYCOLAX ) 17 g packet Take 17 g by mouth daily as needed for mild constipation. 12/27/22   Doroteo Gasmen, MD  potassium chloride  SA (KLOR-CON  M20) 20 MEQ tablet Take 1 tablet (20 mEq total) by mouth daily. May take an extra tablet (20 Meq) daily as needed with extra lasix . 04/26/23   Gollan, Timothy J, MD  traZODone  (DESYREL ) 50 MG tablet Take 0.5 tablets (25 mg total) by mouth at bedtime as needed for sleep. 08/23/23   Kent Pear, MD  VITAMIN E  PO Take 1 capsule by mouth daily.    [provider]    Family History Family History  Problem Relation Age of Onset   Stroke Mother    Heart attack Mother    Arthritis Mother    Hyperlipidemia Mother    Heart disease Mother    Diabetes Mother    Diabetes Sister    Cancer Daughter    Heart disease Daughter    Diabetes Daughter    Diabetes Son    Diabetes Maternal Grandmother    Diabetes Maternal Grandfather     Social History Social History   Tobacco Use   Smoking status: Never   Smokeless tobacco: Never  Vaping Use   Vaping status: Never Used  Substance Use Topics   Alcohol use: No   Drug use: No     Allergies   Amoxicillin , Augmentin  [amoxicillin -pot clavulanate], Codeine, Crestor  [rosuvastatin ], and Penicillins   Review of Systems Review of Systems   Physical Exam Triage Vital Signs ED Triage Vitals  Encounter Vitals Group     BP 09/27/23 1843 (!) 146/69     Systolic BP Percentile --      Diastolic BP Percentile --      Pulse Rate 09/27/23 1843 81     Resp 09/27/23 1843 18     Temp 09/27/23 1843 99 F (37.2 C)     Temp Source 09/27/23 1843 Oral     SpO2 09/27/23 1843 97 %     Weight --      Height --      Head Circumference --      Peak Flow --      Pain Score 09/27/23 1850 0     Pain Loc --      Pain Education --      Exclude from Growth Chart --    No data found.  Updated Vital Signs BP (!)  146/69 (BP Location: Left Arm)   Pulse 81   Temp 99 F (37.2 C) (Oral)   Resp 18   SpO2 97%   Visual Acuity Right Eye Distance:   Left Eye Distance:  Bilateral Distance:    Right Eye Near:   Left Eye Near:    Bilateral Near:     Physical Exam Constitutional:      Appearance: Normal appearance.  HENT:     Head: Normocephalic.     Right Ear: Tympanic membrane, ear canal and external ear normal.     Left Ear: Tympanic membrane, ear canal and external ear normal.     Nose: Congestion present.     Mouth/Throat:     Pharynx: No oropharyngeal exudate or posterior oropharyngeal erythema.  Eyes:     Extraocular Movements: Extraocular movements intact.  Cardiovascular:     Rate and Rhythm: Normal rate and regular rhythm.     Pulses: Normal pulses.     Heart sounds: Normal heart sounds.  Pulmonary:     Effort: Pulmonary effort is normal.     Breath sounds: Normal breath sounds.  Neurological:     Mental Status: She is alert and oriented to person, place, and time. Mental status is at baseline.      UC Treatments / Results  Labs (all labs ordered are listed, but only abnormal results are displayed) Labs Reviewed - No data to display  EKG   Radiology No results found.  Procedures Procedures (including critical care time)  Medications Ordered in UC Medications - No data to display  Initial Impression / Assessment and Plan / UC Course  I have reviewed the triage vital signs and the nursing notes.  Pertinent labs & imaging results that were available during my care of the patient were reviewed by me and considered in my medical decision making (see chart for details).  Acute uri  Patient is in no signs of distress nor toxic appearing.  Vital signs are stable.  Low suspicion for pneumonia, pneumothorax or bronchitis and therefore will defer imaging.  Viral testing deferred due to timeline.  Prescribed azithromycin  prophylactically due to age and health history,  prescribed Tessalon  for management of cough. May use additional over-the-counter medications as needed for supportive care.  May follow-up with urgent care as needed if symptoms persist or worsen.  Final Clinical Impressions(s) / UC Diagnoses   Final diagnoses:  Acute URI     Discharge Instructions      Begin Azithromycin  which will prevent symptoms from ideally worsening of progressing more serious respiratory illness such as pneumonia  May give Tessalon  pill every 8 hours as needed for coughing, if the medicine is too expensive may give over-the-counter Delsym  You can take Tylenol  and/or Ibuprofen  as needed for fever reduction and pain relief.   For cough: honey 1/2 to 1 teaspoon (you can dilute the honey in water  or another fluid).  You can also use guaifenesin and dextromethorphan for cough. You can use a humidifier for chest congestion and cough.  If you don't have a humidifier, you can sit in the bathroom with the hot shower running.      For sore throat: try warm salt water  gargles, cepacol lozenges, throat spray, warm tea or water  with lemon/honey, popsicles or ice, or OTC cold relief medicine for throat discomfort.   For congestion: take a daily anti-histamine like Zyrtec, Claritin, and a oral decongestant, such as pseudoephedrine.  You can also use Flonase  1-2 sprays in each nostril daily.   It is important to stay hydrated: drink plenty of fluids (water , gatorade/powerade/pedialyte, juices, or teas) to keep your throat moisturized and help further relieve irritation/discomfort.    ED Prescriptions  Medication Sig Dispense Auth. Provider   azithromycin  (ZITHROMAX ) 250 MG tablet Take 1 tablet (250 mg total) by mouth daily. Take first 2 tablets together, then 1 every day until finished. 6 tablet Alexis Reber R, NP   benzonatate  (TESSALON ) 100 MG capsule Take 1 capsule (100 mg total) by mouth every 8 (eight) hours. 21 capsule Oshea Percival, Maybelle Spatz, NP      PDMP not  reviewed this encounter.   Reena Canning, NP 09/27/23 [phone]

## 2023-10-08 DIAGNOSIS — G301 Alzheimer's disease with late onset: Secondary | ICD-10-CM | POA: Diagnosis not present

## 2023-10-08 DIAGNOSIS — I4892 Unspecified atrial flutter: Secondary | ICD-10-CM | POA: Diagnosis not present

## 2023-10-08 DIAGNOSIS — I5022 Chronic systolic (congestive) heart failure: Secondary | ICD-10-CM | POA: Diagnosis not present

## 2023-10-08 DIAGNOSIS — F0284 Dementia in other diseases classified elsewhere, unspecified severity, with anxiety: Secondary | ICD-10-CM | POA: Diagnosis not present

## 2023-10-08 DIAGNOSIS — I110 Hypertensive heart disease with heart failure: Secondary | ICD-10-CM | POA: Diagnosis not present

## 2023-10-08 DIAGNOSIS — F02818 Dementia in other diseases classified elsewhere, unspecified severity, with other behavioral disturbance: Secondary | ICD-10-CM | POA: Diagnosis not present

## 2023-10-08 DIAGNOSIS — I480 Paroxysmal atrial fibrillation: Secondary | ICD-10-CM | POA: Diagnosis not present

## 2023-10-08 DIAGNOSIS — I4510 Unspecified right bundle-branch block: Secondary | ICD-10-CM | POA: Diagnosis not present

## 2023-10-08 DIAGNOSIS — E2839 Other primary ovarian failure: Secondary | ICD-10-CM | POA: Diagnosis not present

## 2023-11-10 ENCOUNTER — Encounter: Payer: Self-pay | Admitting: Emergency Medicine

## 2023-11-10 ENCOUNTER — Emergency Department
Admission: EM | Admit: 2023-11-10 | Discharge: 2023-11-11 | Disposition: A | Discharge status: Home / Self Care | Attending: Emergency Medicine | Admitting: Emergency Medicine

## 2023-11-10 DIAGNOSIS — G309 Alzheimer's disease, unspecified: Secondary | ICD-10-CM | POA: Diagnosis not present

## 2023-11-10 DIAGNOSIS — W25XXXA Contact with sharp glass, initial encounter: Secondary | ICD-10-CM | POA: Diagnosis not present

## 2023-11-10 DIAGNOSIS — S91311A Laceration without foreign body, right foot, initial encounter: Secondary | ICD-10-CM | POA: Diagnosis present

## 2023-11-10 NOTE — Discharge Instructions (Signed)
 Gently wash the wound with soap and water.  It is okay to shower, but do not submerge in a bath or go swimming as it is healing.  Do not vigorously scrub.   Gently pat dry.   Once dry, then apply Neosporin or bacitracin or even Vaseline ointment to the area to act as a barrier to help prevent infection.  If she develops any fevers, pus coming from this wound, spreading redness or other signs of infection or other concerns then please return to the ED

## 2023-11-10 NOTE — ED Triage Notes (Signed)
 First Nurse Note: Patient to ED via [facility] from home for right foot laceration. PT stepped on a piece of glass. Foot wrapped and bleeding controlled at this time. Takes a blood thinner. Aox2- baseline, hx dementia.  125/78 98 HR 99% RA

## 2023-11-10 NOTE — ED Provider Notes (Signed)
 [facility] Provider Note    Event Date/Time   First MD Initiated Contact with Patient 11/10/23 1930     (approximate)   History   No chief complaint on file.   HPI  Jill Snow is a 89 y.o. female who presents to the ED for evaluation of No chief complaint on file.   Patient with a history of Alzheimer's disease presents to the ED from home via EMS due to a plantar foot laceration at home.  Reportedly struck the bottom of her foot on a piece of glass.  History is limited initially prior to family arrival as patient has severe Alzheimer's dementia.  She is a rapid bloody foot and was wandering around hallways as soon as she brought to a room, refusing to sit down and refusing to allow [name] to examine her foot.  Initially trying to elope but we're able to convince her to sit so I can examine her foot.  Foot was previously bandaged and is now hemostatic, no other signs of laceration, tiny wound does not require sutures, we replaced the bandage and her son arrives after this and is happy to take her home.  We discussed that evaluation and he is okay with this, acknowledges wound care and ED return precautions.   Physical Exam   Triage Vital Signs: ED Triage Vitals  Encounter Vitals Group     BP      Systolic BP Percentile      Diastolic BP Percentile      Pulse      Resp      Temp      Temp src      SpO2      Weight      Height      Head Circumference      Peak Flow      Pain Score      Pain Loc      Pain Education      Exclude from Growth Chart     Most recent vital signs: There were no vitals filed for this visit.  General: Awake, no distress.  Demented and disoriented, difficult to redirect.  Ambulatory CV:  Good peripheral perfusion.  Resp:  Normal effort.  Abd:  No distention.  MSK:  No deformity noted.  Neuro:  No focal deficits appreciated. Other:  Bloody bandage over a right foot, tracking blood throughout the hallways.  After  I am able to convince her to sit down so I can examine her foot, takedown this bandage to visualize a tiny hemostatic wound to the posterior aspect of the mid plantar foot.  No palpable foreign body or firmness.  Only a couple millimeters in diameter, into the dermis and hemostatic.   ED Results / Procedures / Treatments   Labs (all labs ordered are listed, but only abnormal results are displayed) Labs Reviewed - No data to display  EKG   RADIOLOGY   Official radiology report(s): No results found.  PROCEDURES and INTERVENTIONS:  Procedures  Medications - No data to display   IMPRESSION / MDM / ASSESSMENT AND PLAN / ED COURSE  I reviewed the triage vital signs and the nursing notes.  Differential diagnosis includes, but is not limited to, retained foreign body, laceration, assault  Patient presents with what appears to be an accidental laceration to the plantar aspect of her foot that is hemostatic by the time I visualized it.  No signs of foreign body.  We provide  local wound care and replace with a fresh dressing and provide dressing supplies to the family.  History and physical is limited due to her agitation and dementia.  Her son arrived and is agreeable with wound care and outpatient management.  Discussed return precautions.      FINAL CLINICAL IMPRESSION(S) / ED DIAGNOSES   Final diagnoses:  Laceration of right foot, initial encounter     Rx / DC Orders   ED Discharge Orders     None        Note:  This document was prepared using Dragon voice recognition software and may include unintentional dictation errors.   Delton Prairie, MD 11/10/23 [phone]

## 2023-11-11 ENCOUNTER — Telehealth: Payer: Self-pay | Admitting: Family Medicine

## 2023-11-11 NOTE — Telephone Encounter (Signed)
 Dr Clent Ridges is leaving the practice and your appointment on 02/21/2024 needs to be rescheduled with another provider. Please call the office to reschedule your Transfer of Care to either Dr Charlann Lange, MD, Darleen Crocker or Kara Dies, NP.  E2C2 please reschedule TOC

## 2023-11-11 NOTE — ED Notes (Addendum)
 Pt brought to room 24 in wheelchair by Amy, RN. Barely in room when pt is standing up, attempting to get around Amy and wheelchair, and yelling that she wants to leave. Many staff members present to assist with situation as pt is difficult to redirect and seems unsteady when ambulating. Erie Noe, Charge RN attempting to establish rapport with pt. MD arrives to pts side and redirection to room is briefly easier. Pt continues to refuse to go back into room 24 and compromise is achieved when pt sits on clean hall bed. Katrinka Blazing MD remains at bedside with pt and nursing staff. After some time, Katrinka Blazing MD is able to assess lac on pts foot and pts son arrived shortly after.

## 2023-11-29 ENCOUNTER — Other Ambulatory Visit: Payer: Self-pay

## 2023-11-29 ENCOUNTER — Inpatient Hospital Stay
Admission: EM | Admit: 2023-11-29 | Discharge: 2023-12-10 | DRG: 690 | Disposition: A | Discharge status: Home Health | Attending: Student in an Organized Health Care Education/Training Program | Admitting: Student in an Organized Health Care Education/Training Program

## 2023-11-29 ENCOUNTER — Emergency Department

## 2023-11-29 DIAGNOSIS — Z880 Allergy status to penicillin: Secondary | ICD-10-CM

## 2023-11-29 DIAGNOSIS — Z8261 Family history of arthritis: Secondary | ICD-10-CM

## 2023-11-29 DIAGNOSIS — N390 Urinary tract infection, site not specified: Secondary | ICD-10-CM | POA: Diagnosis not present

## 2023-11-29 DIAGNOSIS — Z888 Allergy status to other drugs, medicaments and biological substances status: Secondary | ICD-10-CM

## 2023-11-29 DIAGNOSIS — I10 Essential (primary) hypertension: Secondary | ICD-10-CM | POA: Diagnosis present

## 2023-11-29 DIAGNOSIS — Z66 Do not resuscitate: Secondary | ICD-10-CM | POA: Diagnosis present

## 2023-11-29 DIAGNOSIS — N3000 Acute cystitis without hematuria: Secondary | ICD-10-CM

## 2023-11-29 DIAGNOSIS — E7800 Pure hypercholesterolemia, unspecified: Secondary | ICD-10-CM | POA: Diagnosis present

## 2023-11-29 DIAGNOSIS — I5022 Chronic systolic (congestive) heart failure: Secondary | ICD-10-CM

## 2023-11-29 DIAGNOSIS — Z79899 Other long term (current) drug therapy: Secondary | ICD-10-CM

## 2023-11-29 DIAGNOSIS — Z833 Family history of diabetes mellitus: Secondary | ICD-10-CM

## 2023-11-29 DIAGNOSIS — Z604 Social exclusion and rejection: Secondary | ICD-10-CM | POA: Diagnosis present

## 2023-11-29 DIAGNOSIS — Z83438 Family history of other disorder of lipoprotein metabolism and other lipidemia: Secondary | ICD-10-CM

## 2023-11-29 DIAGNOSIS — I4892 Unspecified atrial flutter: Secondary | ICD-10-CM | POA: Diagnosis present

## 2023-11-29 DIAGNOSIS — I4891 Unspecified atrial fibrillation: Secondary | ICD-10-CM | POA: Diagnosis present

## 2023-11-29 DIAGNOSIS — I482 Chronic atrial fibrillation, unspecified: Secondary | ICD-10-CM | POA: Diagnosis present

## 2023-11-29 DIAGNOSIS — F02818 Dementia in other diseases classified elsewhere, unspecified severity, with other behavioral disturbance: Secondary | ICD-10-CM | POA: Diagnosis present

## 2023-11-29 DIAGNOSIS — Z823 Family history of stroke: Secondary | ICD-10-CM

## 2023-11-29 DIAGNOSIS — Z7901 Long term (current) use of anticoagulants: Secondary | ICD-10-CM

## 2023-11-29 DIAGNOSIS — R531 Weakness: Principal | ICD-10-CM

## 2023-11-29 DIAGNOSIS — Z885 Allergy status to narcotic agent status: Secondary | ICD-10-CM

## 2023-11-29 DIAGNOSIS — F05 Delirium due to known physiological condition: Secondary | ICD-10-CM | POA: Diagnosis present

## 2023-11-29 DIAGNOSIS — I480 Paroxysmal atrial fibrillation: Secondary | ICD-10-CM | POA: Diagnosis present

## 2023-11-29 DIAGNOSIS — Z8249 Family history of ischemic heart disease and other diseases of the circulatory system: Secondary | ICD-10-CM

## 2023-11-29 DIAGNOSIS — I5032 Chronic diastolic (congestive) heart failure: Secondary | ICD-10-CM | POA: Diagnosis present

## 2023-11-29 DIAGNOSIS — I4430 Unspecified atrioventricular block: Secondary | ICD-10-CM | POA: Diagnosis not present

## 2023-11-29 DIAGNOSIS — G301 Alzheimer's disease with late onset: Secondary | ICD-10-CM | POA: Diagnosis present

## 2023-11-29 DIAGNOSIS — I110 Hypertensive heart disease with heart failure: Secondary | ICD-10-CM | POA: Diagnosis present

## 2023-11-29 LAB — COMPREHENSIVE METABOLIC PANEL WITH GFR
ALT: 35 U/L (ref 0–44)
AST: 61 U/L — ABNORMAL HIGH (ref 15–41)
Albumin: 3.7 g/dL (ref 3.5–5.0)
Alkaline Phosphatase: 68 U/L (ref 38–126)
Anion gap: 8 (ref 5–15)
BUN: 19 mg/dL (ref 8–23)
CO2: 25 mmol/L (ref 22–32)
Calcium: 9.3 mg/dL (ref 8.9–10.3)
Chloride: 106 mmol/L (ref 98–111)
Creatinine, Ser: 0.8 mg/dL (ref 0.44–1.00)
GFR, Estimated: >60 mL/min (ref >60)
Glucose, Bld: 100 mg/dL — ABNORMAL HIGH (ref 70–99)
Potassium: 4.4 mmol/L (ref 3.5–5.1)
Sodium: 139 mmol/L (ref 135–145)
Total Bilirubin: 1.5 mg/dL — ABNORMAL HIGH (ref 0.0–1.2)
Total Protein: 7 g/dL (ref 6.5–8.1)

## 2023-11-29 LAB — CBC WITH DIFFERENTIAL/PLATELET
Abs Immature Granulocytes: 0.03 10*3/uL (ref 0.00–0.07)
Basophils Absolute: 0 10*3/uL (ref 0.0–0.1)
Basophils Relative: 1 %
Eosinophils Absolute: 0.1 10*3/uL (ref 0.0–0.5)
Eosinophils Relative: 2 %
HCT: 37.7 % (ref 36.0–46.0)
Hemoglobin: 12.4 g/dL (ref 12.0–15.0)
Immature Granulocytes: 0 %
Lymphocytes Relative: 6 %
Lymphs Abs: 0.4 10*3/uL — ABNORMAL LOW (ref 0.7–4.0)
MCH: 32.5 pg (ref 26.0–34.0)
MCHC: 32.9 g/dL (ref 30.0–36.0)
MCV: 99 fL (ref 80.0–100.0)
Monocytes Absolute: 0.5 10*3/uL (ref 0.1–1.0)
Monocytes Relative: 6 %
Neutro Abs: 6.4 10*3/uL (ref 1.7–7.7)
Neutrophils Relative %: 85 %
Platelets: 214 10*3/uL (ref 150–400)
RBC: 3.81 MIL/uL — ABNORMAL LOW (ref 3.87–5.11)
RDW: 13.2 % (ref 11.5–15.5)
WBC: 7.5 10*3/uL (ref 4.0–10.5)
nRBC: 0 % (ref 0.0–0.2)

## 2023-11-29 LAB — URINALYSIS, W/ REFLEX TO CULTURE (INFECTION SUSPECTED)
Bilirubin Urine: NEGATIVE
Glucose, UA: NEGATIVE mg/dL
Hgb urine dipstick: NEGATIVE
Ketones, ur: NEGATIVE mg/dL
Nitrite: NEGATIVE
Protein, ur: NEGATIVE mg/dL
Specific Gravity, Urine: 1.011 (ref 1.005–1.030)
Squamous Epithelial / HPF: 0 /HPF (ref 0–5)
WBC, UA: >50 WBC/hpf (ref 0–5)
pH: 7 (ref 5.0–8.0)

## 2023-11-29 LAB — TROPONIN I (HIGH SENSITIVITY): Troponin I (High Sensitivity): 2 ng/L (ref <18)

## 2023-11-29 LAB — LACTIC ACID, PLASMA: Lactic Acid, Venous: 1.2 mmol/L (ref 0.5–1.9)

## 2023-11-29 LAB — BRAIN NATRIURETIC PEPTIDE: B Natriuretic Peptide: 95.1 pg/mL (ref 0.0–100.0)

## 2023-11-29 NOTE — ED Provider Notes (Signed)
 [name] Provider Note    Event Date/Time   First MD Initiated Contact with Patient 11/29/23 1949     (approximate)   History   Weakness (Pt son called ems saying that he has noticed weakness and foul smelling urine the past couple of days. Pt leaves alone and daughter comes by to administer meds. Son reports daughter has not been to pts house to give meds since Saturday. )   HPI  Jill Snow is a 89 y.o. female with history of dementia, atrial fibrillation on Eliquis and amiodarone, presenting with altered mental status as well as foul-smelling urine and generalized weakness.  Per EMS son called saying that he noticed some weakness and falls failure in the past couple days, patient lives alone and daughter comes to give her her medications, son had reported to them that the last time daughter had come to the house was Saturday.  History from patient is very limited, when asked if she has any chest pain or shortness of breath, she says yes asked again she denies.   Independent history obtained from EMS as above.  On independent review, she was seen by her primary care doctor in January, has history of late onset Alzheimer's dementia with behavioral disturbances, was started on trazodone and referred to home health.  Was finally able to chat with son and obtain independent history from him, patient lives with him at night and lives with his sister in the daytime, [place] is out of the [place] and he is her primary caregiver for the last 3 days, he states that since Saturday, patient has been incredibly weak, not able to ambulate as per usual.  He was concerned about a UTI.  States that he is unable to fully care for her if she cannot ambulate appropriately given that he has chronic left shoulder pain.  He denies any trauma or falls.  Physical Exam   Triage Vital Signs: ED Triage Vitals  Encounter Vitals Group     BP 11/29/23 1945 138/63     Systolic BP  Percentile --      Diastolic BP Percentile --      Pulse Rate 11/29/23 1945 72     Resp 11/29/23 1945 16     Temp 11/29/23 1945 97.6 F (36.4 C)     Temp Source 11/29/23 1945 Axillary     SpO2 11/29/23 1945 97 %     Weight 11/29/23 1939 157 lb 10.1 oz (71.5 kg)     Height 11/29/23 1939 5\' 6"  (1.676 m)     Head Circumference --      Peak Flow --      Pain Score --      Pain Loc --      Pain Education --      Exclude from Growth Chart --     Most recent vital signs: Vitals:   11/29/23 1945  BP: 138/63  Pulse: 72  Resp: 16  Temp: 97.6 F (36.4 C)  SpO2: 97%     General: Awake, no distress.  Following commands CV:  Good peripheral perfusion.  Resp:  Normal effort.  Clear Abd:  No distention.  Soft nontender Other:  Bilateral lower extremity edema without unilateral calf swelling or tenderness, able to dorsi and plantarflex without difficulty or asymmetry, grip strength is intact bilaterally, no focal numbness.  No obvious facial droop.  Patient is able to answer some questions, follow commands, is A and O x 1.  ED Results / Procedures / Treatments   Labs (all labs ordered are listed, but only abnormal results are displayed) Labs Reviewed  COMPREHENSIVE METABOLIC PANEL WITH GFR - Abnormal; Notable for the following components:      Result Value   Glucose, Bld 100 (*)    AST 61 (*)    Total Bilirubin 1.5 (*)    All other components within normal limits  CBC WITH DIFFERENTIAL/PLATELET - Abnormal; Notable for the following components:   RBC 3.81 (*)    Lymphs Abs 0.4 (*)    All other components within normal limits  URINALYSIS, W/ REFLEX TO CULTURE (INFECTION SUSPECTED) - Abnormal; Notable for the following components:   Color, Urine AMBER (*)    APPearance CLOUDY (*)    Leukocytes,Ua LARGE (*)    Bacteria, UA MANY (*)    All other components within normal limits  CULTURE, BLOOD (ROUTINE X 2)  CULTURE, BLOOD (ROUTINE X 2)  URINE CULTURE  LACTIC ACID, PLASMA   BRAIN NATRIURETIC PEPTIDE  TROPONIN I (HIGH SENSITIVITY)     EKG  EKG shows sinus rhythm, right bundle branch block, PACs, rate 69, normal QTc, widened QRS, no ischemic ST elevation, T wave version 2 3, T wave flattening to aVF, this is compared to prior to his prior EKG shows atrial fibrillation.   RADIOLOGY CT head on my independent interpretation without obvious intracranial hemorrhage.   PROCEDURES:  Critical Care performed: No  Procedures   MEDICATIONS ORDERED IN ED: Medications - No data to display   IMPRESSION / MDM / ASSESSMENT AND PLAN / ED COURSE  I reviewed the triage vital signs and the nursing notes.                              Differential diagnosis includes, but is not limited to, UTI, electrolyte derangements, pneumonia, ACS, progression of dementia, given that she is on blood thinners, will get a CT head to make sure that she does not have any intracranial hemorrhage.  Get labs, UA, chest x-ray.  Reassess.  Patient's presentation is most consistent with acute presentation with potential threat to life or bodily function.  Independent review of labs imaging below.  Given her UTI, generalized weakness, we will plan to have her admitted for further management.  Will give her first dose of ceftriaxone here in the emergency department.  Will consult hospitalist.  Consult hospitalist was agreeable with plan for admission will evaluate the patient.  She is admitted.  Shared decision making done with son and he is agreeable with the plan.  Clinical Course as of 11/29/23 2327  Mon Nov 29, 2023  2258 CT Head Wo Contrast Chronic atrophic and ischemic changes.  [TT]  2258 DG Chest 1 View IMPRESSION: 1. Small left pleural effusion and minimal left basilar patchy opacities, likely atelectasis. 2. Cardiomegaly.   [TT]  2258 Independent review of labs, no leukocytosis, electrolytes really deranged, creatinine is normal, troponin is not elevated, BNP is normal,  lactate is normal. [TT]  2303 UA is consistent with UTI.  On independent chart review, urine micro previously grew E. coli that is susceptible to ceftriaxone. [TT]    Clinical Course User Index [TT] Jodie Echevaria, Franchot Erichsen, MD     FINAL CLINICAL IMPRESSION(S) / ED DIAGNOSES   Final diagnoses:  Weakness  Urinary tract infection without hematuria, site unspecified     Rx / DC Orders   ED Discharge Orders  None        Note:  This document was prepared using Dragon voice recognition software and may include unintentional dictation errors.    Shane Darling, MD 11/29/23 [phone]

## 2023-11-29 NOTE — ED Notes (Signed)
 This tech with assistance from [place], [place], and [place], [place], performed an in-and-out cath to collect urine sample. Pericare performed and new brief put on patient. Urine sample sent down to lab.

## 2023-11-29 NOTE — H&P (Incomplete)
 History and Physical    Jill Snow:[id number] DOB: May 20, 1934 DOA: 11/29/2023  Referring MD/NP/PA:   PCP: Jill Iba, MD   Patient coming from:  The patient is coming from home.     Chief Complaint: weakness  HPI: Jill Snow is a 89 y.o. female with medical history significant of advanced dementia, hypertension, hyperlipidemia, diastolic CHF, A-fib on Eliquis, right bundle blockage, who presents with weakness.  Per her son at the bedside, pt has generalized weakness in the past several days, more confused with agitation.  Her urine has foul smell.  Per his son, not sure if patient has symptoms of UTI.  Patient does not seem to have chest pain, cough, SOB, nausea, vomiting, diarrhea.  She moves all extremities normally.     Data reviewed independently and ED Course: pt was found to have WBC 7.5, UA (cloudy appearance, large amount of leukocyte, many bacteria, WBC> 50), GFR> 60, troponin 2, lactic acid 1.2.  CT of head negative for acute intracranial abnormalities.  Patient is placed in telemetry bed for observation.  Chest x-ray: 1. Small left pleural effusion and minimal left basilar patchy opacities, likely atelectasis. 2. Cardiomegaly.   EKG: I have personally reviewed.  Sinus rhythm, QTc 468, right axis deviation, right bundle blockade, early R wave progression.   Review of Systems: Could not be reviewed due to dementia.   Allergy:  Allergies  Allergen Reactions   Amoxicillin Nausea Only   Augmentin [Amoxicillin-Pot Clavulanate] Nausea And Vomiting   Codeine Anaphylaxis   Crestor [Rosuvastatin] Other (See Comments)    Myalgias     Penicillins Other (See Comments)    Unknown reaction Tolerates cephalosporins     Past Medical History:  Diagnosis Date   Atrial flutter (HCC)    a. s/p successful TEE/DCCV in 2014; b. TEE 2014 showed EF 45-50%, mild bi-atrial enlargement, mod MR   Dyspnea    History of chicken pox    Hypercholesterolemia     Hypertension    Mitral regurgitation    a. echo 2014: EF 40-45%, mildly dilated RV, mod reduced RV systolic fxn, mod dilated LA, Mild to mod MR, mod TR, mildly elevated PASP   PAF (paroxysmal atrial fibrillation) (HCC)    a. initial episode 2011; b. not on long term anticoagulation since 06/2013   RBBB (right bundle branch block)    Seasonal allergies    Sleep apnea    a. on CPAP   Thyroid nodule 05/19/2017    Past Surgical History:  Procedure Laterality Date   ABDOMINAL HYSTERECTOMY     TONSILLECTOMY AND ADENOIDECTOMY  1947   tummy tuck      Social History:  reports that she has never smoked. She has never used smokeless tobacco. She reports that she does not drink alcohol and does not use drugs.  Family History:  Family History  Problem Relation Age of Onset   Stroke Mother    Heart attack Mother    Arthritis Mother    Hyperlipidemia Mother    Heart disease Mother    Diabetes Mother    Diabetes Sister    Cancer Daughter    Heart disease Daughter    Diabetes Daughter    Diabetes Son    Diabetes Maternal Grandmother    Diabetes Maternal Grandfather      Prior to Admission medications   Medication Sig Start Date End Date Taking? Authorizing Provider  acetaminophen (TYLENOL) 650 MG CR tablet Take 1,300 mg by mouth every 8 (  eight) hours as needed for pain.    [provider]  amiodarone (PACERONE) 200 MG tablet Take 1 tablet (200 mg total) by mouth daily. 04/26/23   Jill Iba, MD  apixaban (ELIQUIS) 5 MG TABS tablet Take 1 tablet (5 mg total) by mouth 2 (two) times daily. 04/26/23   Jill Iba, MD  atorvastatin (LIPITOR) 40 MG tablet TAKE 1 TABLET BY MOUTH EVERY DAY 07/12/23   Glori Luis, MD  azithromycin (ZITHROMAX) 250 MG tablet Take 1 tablet (250 mg total) by mouth daily. Take first 2 tablets together, then 1 every day until finished. 09/27/23   White, Elita Boone, NP  benzonatate (TESSALON) 100 MG capsule Take 1 capsule (100 mg total) by  mouth every 8 (eight) hours. 09/27/23   Valinda Hoar, NP  Cholecalciferol (VITAMIN D-3 PO) Take 1 capsule by mouth at bedtime.    [provider]  furosemide (LASIX) 20 MG tablet TAKE 1 TABLET DAILY. MAY TAKE 1 EXTRA TABLET DAILY AS NEEDED FOR WEIGHT GAIN OF 2 POUNDS OVERNIGHT OR 5 POUNDS IN 1 WEEK 04/26/23   Jill Iba, MD  montelukast (SINGULAIR) 10 MG tablet TAKE 1 TABLET BY MOUTH EVERY DAY 07/12/23   Glori Luis, MD  omeprazole (PRILOSEC) 20 MG capsule Take 1 capsule (20 mg total) by mouth daily. 08/05/23   Jill Iba, MD  polyethylene glycol (MIRALAX / GLYCOLAX) 17 g packet Take 17 g by mouth daily as needed for mild constipation. 12/27/22   Berton Mount I, MD  potassium chloride SA (KLOR-CON M20) 20 MEQ tablet Take 1 tablet (20 mEq total) by mouth daily. May take an extra tablet (20 Meq) daily as needed with extra lasix. 04/26/23   Jill Iba, MD  traZODone (DESYREL) 50 MG tablet Take 0.5 tablets (25 mg total) by mouth at bedtime as needed for sleep. 08/23/23   Glori Luis, MD  VITAMIN E PO Take 1 capsule by mouth daily.    [provider]    Physical Exam: Vitals:   11/29/23 1939 11/29/23 1945 11/30/23 0100 11/30/23 0103  BP:  138/63 (!) 122/95   Pulse:  72    Resp:  16 12   Temp:  97.6 F (36.4 C)  (!) 97.5 F (36.4 C)  TempSrc:  Axillary  Axillary  SpO2:  97%    Weight: 71.5 kg     Height: 5\' 6"  (1.676 m)      General: Not in acute distress HEENT:       Eyes: PERRL, EOMI, no jaundice       ENT: No discharge from the ears and nose       Neck: No JVD, no bruit, no mass felt. Heme: No neck lymph node enlargement. Cardiac: S1/S2, RRR, No gallops or rubs. Respiratory: No rales, wheezing, rhonchi or rubs. GI: Soft, nondistended, nontender, no organomegaly, BS present. GU: No hematuria Ext: No pitting leg edema bilaterally. 1+DP/PT pulse bilaterally. Musculoskeletal: No joint deformities, No joint redness or warmth, no  limitation of ROM in spin. Skin: No rashes.  Neuro: Patient is confused, knows her own name, not orientated to the place and time.  Partially following command.  cranial nerves II-XII grossly intact, moves all extremities. Psych: pt has agitation  Labs on Admission: I have personally reviewed following labs and imaging studies  CBC: Recent Labs  Lab 11/29/23 2000  WBC 7.5  NEUTROABS 6.4  HGB 12.4  HCT 37.7  MCV 99.0  PLT 214  Basic Metabolic Panel: Recent Labs  Lab 11/29/23 2000  NA 139  K 4.4  CL 106  CO2 25  GLUCOSE 100*  BUN 19  CREATININE 0.80  CALCIUM 9.3   GFR: Estimated Creatinine Clearance: 49.3 mL/min (by C-G formula based on SCr of 0.8 mg/dL). Liver Function Tests: Recent Labs  Lab 11/29/23 2000  AST 61*  ALT 35  ALKPHOS 68  BILITOT 1.5*  PROT 7.0  ALBUMIN 3.7   No results for input(s): "LIPASE", "AMYLASE" in the last 168 hours. No results for input(s): "AMMONIA" in the last 168 hours. Coagulation Profile: No results for input(s): "INR", "PROTIME" in the last 168 hours. Cardiac Enzymes: No results for input(s): "CKTOTAL", "CKMB", "CKMBINDEX", "TROPONINI" in the last 168 hours. BNP (last 3 results) No results for input(s): "PROBNP" in the last 8760 hours. HbA1C: No results for input(s): "HGBA1C" in the last 72 hours. CBG: No results for input(s): "GLUCAP" in the last 168 hours. Lipid Profile: No results for input(s): "CHOL", "HDL", "LDLCALC", "TRIG", "CHOLHDL", "LDLDIRECT" in the last 72 hours. Thyroid Function Tests: No results for input(s): "TSH", "T4TOTAL", "FREET4", "T3FREE", "THYROIDAB" in the last 72 hours. Anemia Panel: No results for input(s): "VITAMINB12", "FOLATE", "FERRITIN", "TIBC", "IRON", "RETICCTPCT" in the last 72 hours. Urine analysis:    Component Value Date/Time   COLORURINE AMBER (A) 11/29/2023 2230   APPEARANCEUR CLOUDY (A) 11/29/2023 2230   APPEARANCEUR Hazy 05/05/2012 0014   LABSPEC 1.011 11/29/2023 2230   LABSPEC  1.017 05/05/2012 0014   PHURINE 7.0 11/29/2023 2230   GLUCOSEU NEGATIVE 11/29/2023 2230   GLUCOSEU NEGATIVE 06/04/2021 1134   HGBUR NEGATIVE 11/29/2023 2230   BILIRUBINUR NEGATIVE 11/29/2023 2230   BILIRUBINUR neg 06/09/2021 1621   BILIRUBINUR Negative 05/05/2012 0014   KETONESUR NEGATIVE 11/29/2023 2230   PROTEINUR NEGATIVE 11/29/2023 2230   UROBILINOGEN 0.2 06/09/2021 1621   UROBILINOGEN 0.2 06/04/2021 1134   NITRITE NEGATIVE 11/29/2023 2230   LEUKOCYTESUR LARGE (A) 11/29/2023 2230   LEUKOCYTESUR 3+ 05/05/2012 0014   Sepsis Labs: @LABRCNTIP (procalcitonin:4,lacticidven:4) )No results found for this or any previous visit (from the past 240 hours).   Radiological Exams on Admission:   Assessment/Plan Principal Problem:   UTI (urinary tract infection) Active Problems:   HTN (hypertension)   Atrial fibrillation (HCC)   Hypercholesterolemia   Chronic diastolic CHF (congestive heart failure) (HCC)   Late onset Alzheimer's dementia with behavioral disturbance (HCC)   Assessment and Plan:  UTI (urinary tract infection): No fever or leukocytosis.  Clinically not septic.  -Place in telemetry bed for observation - IV Rocephin - Follow-up urine cultures and blood culture  HTN (hypertension) -IV hydralazine as needed - Lasix which is also for CHF  Atrial fibrillation [facility]): Heart rate is 72 -Amiodarone and Eliquis  Hypercholesterolemia -Lipitor  Chronic diastolic CHF (congestive heart failure) (HCC): 2D echo on 02/11/2023 showed EF 60 to 65% with grade 2 diastolic dysfunction.  Patient has 1+ leg edema, but BNP is normal 95, no SOB.  Does not seem to have CHF exacerbation.  Patient is taking Lasix 20 mg as needed at home. -Start Lasix 20 mg daily  Late onset Alzheimer's dementia with behavioral disturbance (HCC) -prn Haldol for agitation - Fall precaution      DVT ppx: on Eliquis  Code Status: DNR per her son  Family Communication:     Yes, patient's  son  at bed  side.        Disposition Plan:  Anticipate discharge back to previous environment  Consults called:  none  Admission status and Level of care: Telemetry Medical:    for obs     Dispo: The patient is from: Home              Anticipated d/c is to: Home              Anticipated d/c date is: 1 day              Patient currently is not medically stable to d/c.    Severity of Illness:  The appropriate patient status for this patient is OBSERVATION. Observation status is judged to be reasonable and necessary in order to provide the required intensity of service to ensure the patient's safety. The patient's presenting symptoms, physical exam findings, and initial radiographic and laboratory data in the context of their medical condition is felt to place them at decreased risk for further clinical deterioration. Furthermore, it is anticipated that the patient will be medically stable for discharge from the hospital within 2 midnights of admission.        Date of Service 11/30/2023    Fidencio Hue [facility]   If 7PM-7AM, please contact night-coverage [email or web address] 11/30/2023, 1:49 AM

## 2023-11-30 ENCOUNTER — Encounter: Payer: Self-pay | Admitting: Internal Medicine

## 2023-11-30 DIAGNOSIS — N3000 Acute cystitis without hematuria: Secondary | ICD-10-CM | POA: Diagnosis not present

## 2023-11-30 LAB — BASIC METABOLIC PANEL WITH GFR
Anion gap: 7 (ref 5–15)
BUN: 17 mg/dL (ref 8–23)
CO2: 24 mmol/L (ref 22–32)
Calcium: 8.9 mg/dL (ref 8.9–10.3)
Chloride: 109 mmol/L (ref 98–111)
Creatinine, Ser: 0.81 mg/dL (ref 0.44–1.00)
GFR, Estimated: >60 mL/min (ref >60)
Glucose, Bld: 86 mg/dL (ref 70–99)
Potassium: 3.4 mmol/L — ABNORMAL LOW (ref 3.5–5.1)
Sodium: 140 mmol/L (ref 135–145)

## 2023-11-30 LAB — CBC
HCT: 36.8 % (ref 36.0–46.0)
Hemoglobin: 11.9 g/dL — ABNORMAL LOW (ref 12.0–15.0)
MCH: 32.2 pg (ref 26.0–34.0)
MCHC: 32.3 g/dL (ref 30.0–36.0)
MCV: 99.7 fL (ref 80.0–100.0)
Platelets: 192 10*3/uL (ref 150–400)
RBC: 3.69 MIL/uL — ABNORMAL LOW (ref 3.87–5.11)
RDW: 13.1 % (ref 11.5–15.5)
WBC: 5.9 10*3/uL (ref 4.0–10.5)
nRBC: 0 % (ref 0.0–0.2)

## 2023-11-30 MED ORDER — AMIODARONE HCL 200 MG PO TABS
200.0000 mg | ORAL_TABLET | Freq: Every day | ORAL | Status: DC
  Administered 2023-12-01 – 2023-12-05 (×5): 200 mg via ORAL
  Filled 2023-11-30 (×6): qty 1

## 2023-11-30 MED ORDER — POTASSIUM CHLORIDE 20 MEQ PO PACK
40.0000 meq | PACK | Freq: Once | ORAL | Status: AC
  Administered 2023-11-30: 40 meq via ORAL
  Filled 2023-11-30: qty 2

## 2023-11-30 MED ORDER — OLANZAPINE 5 MG PO TABS
2.5000 mg | ORAL_TABLET | Freq: Once | ORAL | Status: AC
  Administered 2023-11-30: 2.5 mg via ORAL
  Filled 2023-11-30: qty 1

## 2023-11-30 MED ORDER — ONDANSETRON HCL 4 MG/2ML IJ SOLN: 4.0000 mg | Freq: Three times a day (TID) | INTRAMUSCULAR | Status: DC | PRN

## 2023-11-30 MED ORDER — HALOPERIDOL LACTATE 5 MG/ML IJ SOLN
1.0000 mg | Freq: Three times a day (TID) | INTRAMUSCULAR | Status: DC | PRN
  Administered 2023-11-30 – 2023-12-01 (×2): 1 mg via INTRAVENOUS
  Filled 2023-11-30 (×2): qty 1

## 2023-11-30 MED ORDER — FUROSEMIDE 40 MG PO TABS: 20.0000 mg | ORAL_TABLET | Freq: Every day | ORAL | Status: DC

## 2023-11-30 MED ORDER — SODIUM CHLORIDE 0.9 % IV SOLN
1.0000 g | INTRAVENOUS | Status: DC
  Administered 2023-11-30 – 2023-12-01 (×2): 1 g via INTRAVENOUS
  Filled 2023-11-30 (×3): qty 10

## 2023-11-30 MED ORDER — APIXABAN 5 MG PO TABS
5.0000 mg | ORAL_TABLET | Freq: Two times a day (BID) | ORAL | Status: DC
  Administered 2023-11-30 – 2023-12-10 (×20): 5 mg via ORAL
  Filled 2023-11-30 (×21): qty 1

## 2023-11-30 MED ORDER — FUROSEMIDE 20 MG PO TABS
20.0000 mg | ORAL_TABLET | Freq: Every day | ORAL | Status: DC
  Administered 2023-12-01 – 2023-12-03 (×3): 20 mg via ORAL
  Filled 2023-11-30 (×4): qty 1

## 2023-11-30 MED ORDER — HALOPERIDOL LACTATE 5 MG/ML IJ SOLN
4.0000 mg | Freq: Once | INTRAMUSCULAR | Status: AC
  Administered 2023-11-30: 4 mg via INTRAVENOUS
  Filled 2023-11-30: qty 1

## 2023-11-30 MED ORDER — POLYETHYLENE GLYCOL 3350 17 G PO PACK
17.0000 g | PACK | Freq: Every day | ORAL | Status: DC | PRN
  Administered 2023-12-02 – 2023-12-07 (×3): 17 g via ORAL
  Filled 2023-11-30 (×3): qty 1

## 2023-11-30 MED ORDER — PANTOPRAZOLE SODIUM 40 MG PO TBEC
40.0000 mg | DELAYED_RELEASE_TABLET | Freq: Every day | ORAL | Status: DC
  Administered 2023-12-01 – 2023-12-10 (×10): 40 mg via ORAL
  Filled 2023-11-30 (×11): qty 1

## 2023-11-30 MED ORDER — ENSURE ENLIVE PO LIQD
237.0000 mL | Freq: Two times a day (BID) | ORAL | Status: DC
  Administered 2023-11-30 – 2023-12-10 (×17): 237 mL via ORAL

## 2023-11-30 MED ORDER — FUROSEMIDE 40 MG PO TABS: 20.0000 mg | ORAL_TABLET | Freq: Every day | ORAL | Status: DC | PRN

## 2023-11-30 MED ORDER — ATORVASTATIN CALCIUM 20 MG PO TABS
40.0000 mg | ORAL_TABLET | Freq: Every day | ORAL | Status: DC
  Administered 2023-12-01 – 2023-12-10 (×10): 40 mg via ORAL
  Filled 2023-11-30 (×11): qty 2

## 2023-11-30 MED ORDER — IBUPROFEN 400 MG PO TABS: 200.0000 mg | ORAL_TABLET | Freq: Four times a day (QID) | ORAL | Status: DC | PRN

## 2023-11-30 MED ORDER — HYDRALAZINE HCL 20 MG/ML IJ SOLN: 5.0000 mg | INTRAMUSCULAR | Status: DC | PRN

## 2023-11-30 MED ORDER — ACETAMINOPHEN 325 MG PO TABS: 650.0000 mg | ORAL_TABLET | Freq: Four times a day (QID) | ORAL | Status: DC | PRN

## 2023-11-30 MED ORDER — TRAZODONE HCL 50 MG PO TABS
25.0000 mg | ORAL_TABLET | Freq: Every evening | ORAL | Status: DC | PRN
  Administered 2023-11-30: 25 mg via ORAL
  Filled 2023-11-30: qty 1

## 2023-11-30 NOTE — Evaluation (Signed)
 Physical Therapy Evaluation Patient Details Name: Jill Snow MRN: [id number] DOB: 1933/08/23 Today's Date: 11/30/2023  History of Present Illness  Pt admitted for UTI. Pt with complaints of weakness and agitation. History of advanced dementia, HTN, and HLD.  Clinical Impression  Pt is a pleasant 89 year old female who was admitted for UTI. Pt performs bed mobility, transfers, and ambulation with mod assist +2. Pt appears confused and is only alert to self. Keeps eyes closed majority of time and is fidgety, trying to get OOB upon arrival. Pt demonstrates deficits with cognition/mobility/balance. Currently needs +2 for safety at this time. Lunch tray at bedside untouched. Offered to patient, currently refusing. Would benefit from skilled PT to address above deficits and promote optimal return to PLOF. Does not appear at baseline level. Pt will continue to receive skilled PT services while admitted and will defer to TOC/care team for updates regarding disposition planning.       If plan is discharge home, recommend the following: Two people to help with walking and/or transfers;Two people to help with bathing/dressing/bathroom   Can travel by private vehicle   No    Equipment Recommendations  (TBD)  Recommendations for Other Services       Functional Status Assessment Patient has had a recent decline in their functional status and demonstrates the ability to make significant improvements in function in a reasonable and predictable amount of time.     Precautions / Restrictions Precautions Precautions: Fall Restrictions Weight Bearing Restrictions Per Provider Order: No      Mobility  Bed Mobility Overal bed mobility: Needs Assistance Bed Mobility: Supine to Sit, Sit to Supine     Supine to sit: Min assist Sit to supine: Mod assist   General bed mobility comments: needs assist for trunkal elevation. Once seated at EOB, able to sit with cga. Only required mod assist to  return back supine    Transfers Overall transfer level: Needs assistance Equipment used: Rolling walker (2 wheels) Transfers: Sit to/from Stand Sit to Stand: Mod assist, +2 physical assistance           General transfer comment: poor initiation. Pt able to stand at bedside, however with heavy post lean    Ambulation/Gait Ambulation/Gait assistance: Mod assist, +2 physical assistance Gait Distance (Feet): 2 Feet Assistive device: Rolling walker (2 wheels) Gait Pattern/deviations: Step-to pattern       General Gait Details: poor gait sequencing with decreased stance time on R LE. Has difficulty with foot placement. Small side steps taken up towards HOB.  Stairs            Wheelchair Mobility     Tilt Bed    Modified Rankin (Stroke Patients Only)       Balance Overall balance assessment: Needs assistance Sitting-balance support: Feet supported Sitting balance-Leahy Scale: Fair     Standing balance support: Bilateral upper extremity supported Standing balance-Leahy Scale: Poor                               Pertinent Vitals/Pain Pain Assessment Pain Assessment: No/denies pain    Home Living Family/patient expects to be discharged to:: Private residence                   Additional Comments: pt is poor historian and unable to give history. Previous note reports she is living with family    Prior Function Prior Level of Function : Patient poor  historian/Family not available             Mobility Comments: pt is poor historian, unsure of PLOF. Previous note indicates no AD at baseline       Extremity/Trunk Assessment   Upper Extremity Assessment Upper Extremity Assessment: Difficult to assess due to impaired cognition;Generalized weakness    Lower Extremity Assessment Lower Extremity Assessment: Generalized weakness;Difficult to assess due to impaired cognition       Communication   Communication Communication: No  apparent difficulties    Cognition Arousal: Alert Behavior During Therapy: Restless   PT - Cognitive impairments: No family/caregiver present to determine baseline                       PT - Cognition Comments: pt restless, trying to get OOB, although does keep eyes closed through majority of session. Following commands: Impaired Following commands impaired: Follows one step commands inconsistently     Cueing Cueing Techniques: Gestural cues, Verbal cues, Tactile cues     General Comments      Exercises     Assessment/Plan    PT Assessment Patient needs continued PT services  PT Problem List Decreased safety awareness;Decreased cognition;Decreased mobility;Decreased balance;Decreased strength       PT Treatment Interventions Gait training;DME instruction;Therapeutic exercise;Balance training    PT Goals (Current goals can be found in the Care Plan section)  Acute Rehab PT Goals Patient Stated Goal: pt unable to state PT Goal Formulation: Patient unable to participate in goal setting Time For Goal Achievement: 12/14/23 Potential to Achieve Goals: Fair    Frequency Min 2X/week     Co-evaluation               AM-PAC PT "6 Clicks" Mobility  Outcome Measure Help needed turning from your back to your side while in a flat bed without using bedrails?: A Little Help needed moving from lying on your back to sitting on the side of a flat bed without using bedrails?: A Little Help needed moving to and from a bed to a chair (including a wheelchair)?: A Lot Help needed standing up from a chair using your arms (e.g., wheelchair or bedside chair)?: A Lot Help needed to walk in hospital room?: Total Help needed climbing 3-5 steps with a railing? : Total 6 Click Score: 12    End of Session   Activity Tolerance: Patient tolerated treatment well Patient left: in bed;with bed alarm set Nurse Communication: Mobility status PT Visit Diagnosis: Muscle weakness  (generalized) (M62.81);Unsteadiness on feet (R26.81);Difficulty in walking, not elsewhere classified (R26.2)    Time: 1610-9604 PT Time Calculation (min) (ACUTE ONLY): 13 min   Charges:   PT Evaluation $PT Eval Low Complexity: 1 Low   PT General Charges $$ ACUTE PT VISIT: 1 Visit         Amparo Balk, PT, DPT, [facility] [phone]   Hosie Sharman 11/30/2023, 3:08 PM

## 2023-11-30 NOTE — Care Management Obs Status (Signed)
 MEDICARE OBSERVATION STATUS NOTIFICATION   Patient Details  Name: Jill Snow MRN: [id number] Date of Birth: 01/06/1934   Medicare Observation Status Notification Given:  Rudolph Cost, CMA 11/30/2023, 2:48 PM

## 2023-11-30 NOTE — ED Notes (Signed)
 Pt is able to alert to verbal stimuli but not awake enough to swallow pills at this time. Meds held, will reassess shortly. Pt is wet, brief and bedding changed. New gown placed, bed alarm on

## 2023-11-30 NOTE — H&P (Signed)
 Unable to assess patient for CPAP. Patient has been agitated and now is resting quietly. No history of sleep apnea in history, will attempt to reassess tomorrow

## 2023-11-30 NOTE — Plan of Care (Signed)
  Problem: Clinical Measurements: Goal: Ability to maintain clinical measurements within normal limits will improve Outcome: Progressing   Problem: Activity: Goal: Risk for activity intolerance will decrease Outcome: Progressing   Problem: Nutrition: Goal: Adequate nutrition will be maintained Outcome: Progressing   Problem: Elimination: Goal: Will not experience complications related to bowel motility Outcome: Progressing Goal: Will not experience complications related to urinary retention Outcome: Progressing   Problem: Safety: Goal: Ability to remain free from injury will improve Outcome: Progressing

## 2023-11-30 NOTE — Progress Notes (Signed)
 Progress Note   Patient: Jill Snow ZOX:[id number] DOB: July 19, 1934 DOA: 11/29/2023     0 DOS: the patient was seen and examined on 11/30/2023   Brief hospital course:   Jill Snow is a 89 y.o. female with medical history significant of advanced dementia, hypertension, hyperlipidemia, diastolic CHF, A-fib on Eliquis, right bundle blockage, who presents with weakness from home where she lives with her son. Admitted for treatment of UTI   Assessment and Plan: No notes have been filed under this hospital service. Service: Hospitalist  UTI (urinary tract infection): No fever or leukocytosis.  Clinically not septic. - IV Rocephin pending urine and blood cultures      HTN (hypertension) -IV hydralazine as needed. BP at goal for age    Atrial fibrillation [facility]): Heart rate is 72 -Amiodarone and Eliquis   Hypercholesterolemia -Lipitor   Chronic diastolic CHF (congestive heart failure) (HCC): 2D echo on 02/11/2023 showed EF 60 to 65% with grade 2 diastolic dysfunction.  Patient has 1+ leg edema, but BNP is normal 95, no SOB.  Does not seem to have CHF exacerbation.  Patient is taking Lasix 20 mg as needed at home. -cont  Lasix 20 mg daily   Late onset Alzheimer's dementia with behavioral disturbance (HCC) -prn Haldol for agitation - Fall and delerium precaution -PT/OT evaluation    Monitor/replace electrolytes  2gm sodium diet  No IVF  DNR  Eliquis      Subjective: This am patient sedated and unable to participate in interview.- required antipsychotics overnight for agitation/sundowning.   Physical Exam: Vitals:   11/30/23 0715 11/30/23 0745 11/30/23 1000 11/30/23 1308  BP: (!) 125/53  (!) 108/49 (!) 150/61  Pulse: 68  (!) 49 60  Resp: 15  11 14   Temp:  (!) 97.1 F (36.2 C)  97.6 F (36.4 C)  TempSrc:  Oral  Oral  SpO2: 98%  98% 99%  Weight:      Height:      Physical Exam Vitals and nursing note reviewed.  Constitutional:      General: She is not in  acute distress.    Appearance: She is not ill-appearing.  HENT:     Head: Normocephalic.  Cardiovascular:     Rate and Rhythm: Normal rate. Rhythm irregular.  Pulmonary:     Effort: Pulmonary effort is normal.     Breath sounds: Normal breath sounds.  Abdominal:     General: Bowel sounds are normal.     Palpations: Abdomen is soft.     Tenderness: There is no right CVA tenderness or left CVA tenderness.  Musculoskeletal:     Cervical back: Neck supple.  Skin:    General: Skin is warm and dry.  Neurological:     Comments: Unable to assess, patient sedated from antipsychotics required overnight      Data Reviewed:   Labs on Admission: I have personally reviewed following labs and imaging studies  CBC: Recent Labs  Lab 11/29/23 2000 11/30/23 0549  WBC 7.5 5.9  NEUTROABS 6.4  --   HGB 12.4 11.9*  HCT 37.7 36.8  MCV 99.0 99.7  PLT 214 192   Basic Metabolic Panel: Recent Labs  Lab 11/29/23 2000 11/30/23 0549  NA 139 140  K 4.4 3.4*  CL 106 109  CO2 25 24  GLUCOSE 100* 86  BUN 19 17  CREATININE 0.80 0.81  CALCIUM 9.3 8.9   GFR: Estimated Creatinine Clearance: 48.7 mL/min (by C-G formula based on SCr of 0.81  mg/dL). Liver Function Tests: Recent Labs  Lab 11/29/23 2000  AST 61*  ALT 35  ALKPHOS 68  BILITOT 1.5*  PROT 7.0  ALBUMIN 3.7   No results for input(s): "LIPASE", "AMYLASE" in the last 168 hours. No results for input(s): "AMMONIA" in the last 168 hours. Coagulation Profile: No results for input(s): "INR", "PROTIME" in the last 168 hours. Cardiac Enzymes: No results for input(s): "CKTOTAL", "CKMB", "CKMBINDEX", "TROPONINI" in the last 168 hours. BNP (last 3 results) No results for input(s): "PROBNP" in the last 8760 hours. HbA1C: No results for input(s): "HGBA1C" in the last 72 hours. CBG: No results for input(s): "GLUCAP" in the last 168 hours. Lipid Profile: No results for input(s): "CHOL", "HDL", "LDLCALC", "TRIG", "CHOLHDL", "LDLDIRECT"  in the last 72 hours. Thyroid Function Tests: No results for input(s): "TSH", "T4TOTAL", "FREET4", "T3FREE", "THYROIDAB" in the last 72 hours. Anemia Panel: No results for input(s): "VITAMINB12", "FOLATE", "FERRITIN", "TIBC", "IRON", "RETICCTPCT" in the last 72 hours. Urine analysis:    Component Value Date/Time   COLORURINE AMBER (A) 11/29/2023 2230   APPEARANCEUR CLOUDY (A) 11/29/2023 2230   APPEARANCEUR Hazy 05/05/2012 0014   LABSPEC 1.011 11/29/2023 2230   LABSPEC 1.017 05/05/2012 0014   PHURINE 7.0 11/29/2023 2230   GLUCOSEU NEGATIVE 11/29/2023 2230   GLUCOSEU NEGATIVE 06/04/2021 1134   HGBUR NEGATIVE 11/29/2023 2230   BILIRUBINUR NEGATIVE 11/29/2023 2230   BILIRUBINUR neg 06/09/2021 1621   BILIRUBINUR Negative 05/05/2012 0014   KETONESUR NEGATIVE 11/29/2023 2230   PROTEINUR NEGATIVE 11/29/2023 2230   UROBILINOGEN 0.2 06/09/2021 1621   UROBILINOGEN 0.2 06/04/2021 1134   NITRITE NEGATIVE 11/29/2023 2230   LEUKOCYTESUR LARGE (A) 11/29/2023 2230   LEUKOCYTESUR 3+ 05/05/2012 0014    Radiological Exams on Admission: CT Head Wo Contrast Result Date: 11/29/2023 CLINICAL DATA:  Altered mental status EXAM: CT HEAD WITHOUT CONTRAST TECHNIQUE: Contiguous axial images were obtained from the base of the skull through the vertex without intravenous contrast. RADIATION DOSE REDUCTION: This exam was performed according to the departmental dose-optimization program which includes automated exposure control, adjustment of the mA and/or kV according to patient size and/or use of iterative reconstruction technique. COMPARISON:  02/10/2023 FINDINGS: Brain: No evidence of acute infarction, hemorrhage, hydrocephalus, extra-axial collection or mass lesion/mass effect. Mild atrophic and chronic white matter ischemic changes are noted. Vascular: No hyperdense vessel or unexpected calcification. Skull: Normal. Negative for fracture or focal lesion. Sinuses/Orbits: No acute finding. Other: None. IMPRESSION:  Chronic atrophic and ischemic changes. Electronically Signed   By: Violeta Grey M.D.   On: 11/29/2023 22:08   DG Chest 1 View Result Date: 11/29/2023 CLINICAL DATA:  Altered mental status EXAM: CHEST  1 VIEW COMPARISON:  Chest x-ray 02/10/2023 FINDINGS: Heart is enlarged. There is a small left pleural effusion and minimal left basilar patchy opacities. Right lung is clear. No pneumothorax or acute fracture. IMPRESSION: 1. Small left pleural effusion and minimal left basilar patchy opacities, likely atelectasis. 2. Cardiomegaly. Electronically Signed   By: Tyron Gallon M.D.   On: 11/29/2023 21:49           Time spent: 35 minutes  Author: Charlesetta Connors, DO 11/30/2023 4:23 PM  For on call review [email or web address].

## 2023-11-30 NOTE — ED Notes (Addendum)
 Pt is continuing to take leads off and trying to climb out of bed and remove IV.

## 2023-11-30 NOTE — ED Notes (Addendum)
 Pt continues to jump out of the bed and becoming increasingly agitated. Pt cursing at this RN and pulling off leads. This RN was redirecting patient to get back in bed and patient tried biting this RN. Provider notified.

## 2023-11-30 NOTE — Progress Notes (Addendum)
 Pt became alert and began trying to exit the bed without assistance; RN redirected pt toward only getting up with assistance.  Pt oriented to person.  Pt became agitated about 1730, walked unit with RN for about 1.5 hours, haldol given to little effect.  Security called.  Pt seems sensitive to shows of force, coersion.  Pt quickly returns to impulsive behavior as soon as the shows of force are relieved.  Pt needs sitter and sedation or someone she trusts to redirect her behavior.  As of 1900, pt continues to be agitated with continuous activity.

## 2023-12-01 DIAGNOSIS — N3000 Acute cystitis without hematuria: Secondary | ICD-10-CM | POA: Diagnosis not present

## 2023-12-01 LAB — CBC
HCT: 38.7 % (ref 36.0–46.0)
Hemoglobin: 12.8 g/dL (ref 12.0–15.0)
MCH: 32.5 pg (ref 26.0–34.0)
MCHC: 33.1 g/dL (ref 30.0–36.0)
MCV: 98.2 fL (ref 80.0–100.0)
Platelets: 175 10*3/uL (ref 150–400)
RBC: 3.94 MIL/uL (ref 3.87–5.11)
RDW: 13.2 % (ref 11.5–15.5)
WBC: 4.9 10*3/uL (ref 4.0–10.5)
nRBC: 0 % (ref 0.0–0.2)

## 2023-12-01 LAB — COMPREHENSIVE METABOLIC PANEL WITH GFR
ALT: 32 U/L (ref 0–44)
AST: 43 U/L — ABNORMAL HIGH (ref 15–41)
Albumin: 3.3 g/dL — ABNORMAL LOW (ref 3.5–5.0)
Alkaline Phosphatase: 62 U/L (ref 38–126)
Anion gap: 7 (ref 5–15)
BUN: 20 mg/dL (ref 8–23)
CO2: 25 mmol/L (ref 22–32)
Calcium: 9.2 mg/dL (ref 8.9–10.3)
Chloride: 110 mmol/L (ref 98–111)
Creatinine, Ser: 0.92 mg/dL (ref 0.44–1.00)
GFR, Estimated: 60 mL/min — ABNORMAL LOW (ref >60)
Glucose, Bld: 69 mg/dL — ABNORMAL LOW (ref 70–99)
Potassium: 4.1 mmol/L (ref 3.5–5.1)
Sodium: 142 mmol/L (ref 135–145)
Total Bilirubin: 1.2 mg/dL (ref 0.0–1.2)
Total Protein: 6.5 g/dL (ref 6.5–8.1)

## 2023-12-01 LAB — URINE CULTURE

## 2023-12-01 MED ORDER — HALOPERIDOL LACTATE 5 MG/ML IJ SOLN
1.0000 mg | Freq: Once | INTRAMUSCULAR | Status: AC
  Administered 2023-12-01: 1 mg via INTRAVENOUS
  Filled 2023-12-01: qty 1

## 2023-12-01 MED ORDER — TRAZODONE HCL 50 MG PO TABS
50.0000 mg | ORAL_TABLET | Freq: Every day | ORAL | Status: DC
  Administered 2023-12-01 – 2023-12-09 (×9): 50 mg via ORAL
  Filled 2023-12-01 (×9): qty 1

## 2023-12-01 MED ORDER — CEPHALEXIN 500 MG PO CAPS
500.0000 mg | ORAL_CAPSULE | Freq: Three times a day (TID) | ORAL | Status: AC
  Administered 2023-12-02 – 2023-12-04 (×9): 500 mg via ORAL
  Filled 2023-12-01 (×9): qty 1

## 2023-12-01 MED ORDER — LORAZEPAM 2 MG/ML IJ SOLN
1.0000 mg | Freq: Four times a day (QID) | INTRAMUSCULAR | Status: DC | PRN
  Administered 2023-12-03 – 2023-12-09 (×4): 1 mg via INTRAVENOUS
  Filled 2023-12-01 (×4): qty 1

## 2023-12-01 MED ORDER — QUETIAPINE FUMARATE 25 MG PO TABS
50.0000 mg | ORAL_TABLET | Freq: Every day | ORAL | Status: DC
  Administered 2023-12-01 – 2023-12-09 (×9): 50 mg via ORAL
  Filled 2023-12-01 (×9): qty 2

## 2023-12-01 MED ORDER — HALOPERIDOL LACTATE 5 MG/ML IJ SOLN
2.0000 mg | Freq: Three times a day (TID) | INTRAMUSCULAR | Status: DC | PRN
  Administered 2023-12-05 – 2023-12-09 (×3): 2 mg via INTRAVENOUS
  Filled 2023-12-01 (×3): qty 1

## 2023-12-01 NOTE — Hospital Course (Addendum)
 Hospital course / significant events:   HPI: Jill Snow is a 89 y.o. female with medical history significant of advanced dementia, hypertension, hyperlipidemia, diastolic CHF, A-fib on Eliquis , right bundle blockage, who presents from home with weakness. Per son on admission, pt has generalized weakness in the past several days, more confused with agitation.  Her urine has foul smell.   04/14: admitted to hospitalist for UTI 04/15: required antipsychotics overnight for agitation/sundowning.  04/16: UCx multiple species, will continue abx to complete total 5 days. Continued confusion, difficult to redirect, medications adjusted for patient safety and transition to po as able. Needs SNF placement 04/17: no TOC note in chart as of this morning, placement pending  04/18: tachycardic overnight, sinus rhythm. Hx has not been on BB d/t bradycardia, received metoprolol  x1. Still tachycardic, sinus, no complaints. Will w/u for infection - CBC, CMP, UA, CXR. Place on telemetry. Infectious w/u non-revealing, gave fluids, HR improved  04/19: more sleepy this morning, rousing briefly to pain stimuli but not verbalizing, muscle tone is strong all extremities she is pulling away. Per RN was up multiple times overnight. Will check frequently, no focal deficits.  04/20: more alert today. EKG confirms Afib briefly sustaining into 130's but responded well to cardizem  IV w/ addition of po, however back into RVR and needing to start cardizem  gtt.  04/21: back into sinus rhythm, will taper off dilt drip, if if rhythm ok on po meds and no other complications, can possibly dc tomorrow      Consultants:  none  Procedures/Surgeries: none      ASSESSMENT & PLAN:   UTI (urinary tract infection) No fever or leukocytosis. Have ruled out sepsis. UCx multiple species  IV Rocephin  --> po keflex  complete 5 days total     Paroxysmal Atrial fibrillation  Sinus Tachycardia overnight 04/17-04/18 Into RVR  04/20, back into sinus 04/21 Amiodarone  200 mg daily --> increased to 200 bid  Eliquis  NO beta blocker d/t hx bradycardia but may consider initiate this Weaning cardizem  drip Cardizem  po currently at 30 mg q6h - consolidate dose if rate remains controlled off drip  Low threshold for cardiology consult but pt is improving   Late onset Alzheimer's dementia with behavioral disturbance Continued confusion, difficult to redirect, medications adjusted for patient safety - she has been wandering into other patient rooms Occasionally very alert, talkative, other times very somnolent falls asleep easily but awakens to voice   prn Haldol  for agitation Prn ativan  for severe agitation / sedation  Scheduled at bedtime seroquel  and trazodone  --> have reduced these given somnolence   Fall and delerium precaution Transitioned most meds to po or prns to IM, if lose IV access would leave it out   HTN (hypertension) BP at goal for age  Monitor VS   Hypercholesterolemia Lipitor    Chronic diastolic CHF (congestive heart failure) 2D echo on 02/11/2023 showed EF 60 to 65% with grade 2 diastolic dysfunction. No CHF exacerbation this admission.  cont Lasix  20 mg daily, home dose Monitor BMP  No concerns based on BMI: Body mass index is 24.81 kg/m.  Underweight - under 18  overweight - 25 to 29 obese - 30 or more Class 1 obesity: BMI of 30.0 to 34 Class 2 obesity: BMI of 35.0 to 39 Class 3 obesity: BMI of 40.0 to 49 Super Morbid Obesity: BMI 50-59 Super-super Morbid Obesity: BMI 60+ Significantly low or high BMI is associated with higher medical risk.  Weight management advised as adjunct to other disease  management and risk reduction treatments    DVT prophylaxis: Eliquis  IV fluids: no continuous IV fluids  Nutrition: cardiac diet  Central lines / other devices: none  Code Status: DNR ACP documentation reviewed:  none on file in [facility]  [facility] needs: SNF rehab placement vs sone states likely he  will take her home when ready may need HH/DME  Medical barriers to dispo: Afib RVR, expect dementia may be barrier to rehab, may need LTC vs home w/ family

## 2023-12-01 NOTE — Progress Notes (Signed)
 Able to communicate with family member. Family member states she will not wear the cpap unit and I could remove the unit from the room. He states she wore a cpap unit many many years ago and not consistently. Unit has been removed from room

## 2023-12-01 NOTE — Progress Notes (Signed)
 Occupational Therapy Evaluation Patient Details Name: Jill Snow MRN: [id number] DOB: 10-Sep-1933 Today's Date: 12/01/2023   History of Present Illness   Pt admitted for UTI. Pt with complaints of weakness and agitation. History of advanced dementia, HTN, and HLD.     Clinical Impressions Pt was seen for OT/PT evaluation this date. Pt is a poor historian and unable to give detailed history. Gathered information from ED admission implies that the pt stays with her daugher during the day and stays with her son at night. Will need to confirm with family. Previous admission reports pt doesn't use any DME to amb PTA. Pt oriented to name only during session. Pt presents to acute OT demonstrating impaired ADL performance and functional mobility (See OT problem list for additional functional deficits). Pt currently requires MINA for amb with use of RW, frequent assistance for DME management and sequencing during amb. MAXA required for peri care, doffed socks unable to don due to sequencing deficits, completed with step by step verbal/tactile cues. Total amb ~49ft during session with RW often using HHA instead for short distances. Pt would benefit from skilled OT services to address noted impairments and functional limitations (see below for any additional details) in order to maximize safety and independence while minimizing falls risk and caregiver burden. OT will follow acutely.     If plan is discharge home, recommend the following:   A lot of help with walking and/or transfers;A lot of help with bathing/dressing/bathroom;Assistance with cooking/housework;Direct supervision/assist for medications management;Direct supervision/assist for financial management;Assist for transportation;Help with stairs or ramp for entrance;Supervision due to cognitive status     Functional Status Assessment   Patient has had a recent decline in their functional status and demonstrates the ability to make  significant improvements in function in a reasonable and predictable amount of time.     Equipment Recommendations   None recommended by OT (Defer to next venue of care)     Recommendations for Other Services         Precautions/Restrictions   Precautions Precautions: Fall Recall of Precautions/Restrictions: Impaired Restrictions Weight Bearing Restrictions Per Provider Order: No     Mobility Bed Mobility Overal bed mobility: Needs Assistance             General bed mobility comments: Seated on EOB on arrival to room with sitter present, return to sitting on EOB, verbal cues and HHA for steps up the bed to retire in more secure seated position.    Transfers Overall transfer level: Needs assistance Equipment used: Rolling walker (2 wheels), 1 person hand held assist Transfers: Sit to/from Stand, Bed to chair/wheelchair/BSC Sit to Stand: Min assist, Contact guard assist, +2 safety/equipment           General transfer comment: Verbal cues and frequent redirecting to stay on task. MINA for external support during mobility and DME management      Balance Overall balance assessment: Needs assistance Sitting-balance support: Feet supported Sitting balance-Leahy Scale: Good     Standing balance support: Bilateral upper extremity supported, Single extremity supported, During functional activity Standing balance-Leahy Scale: Poor                             ADL either performed or assessed with clinical judgement   ADL Overall ADL's : Needs assistance/impaired Eating/Feeding: Set up;Sitting   Grooming: Wash/dry hands;Standing;Contact guard assist;Cueing for sequencing  Lower Body Dressing: Cueing for sequencing;Minimal assistance   Toilet Transfer: Minimal assistance;Ambulation;Rolling walker (2 wheels);Cueing for sequencing;Cueing for safety   Toileting- Clothing Manipulation and Hygiene: Maximal assistance;Sit to/from  stand       Functional mobility during ADLs: Minimal assistance;Cueing for sequencing;Rolling walker (2 wheels) (HHA) General ADL Comments: MAXA peri care, doffed socks unable to don due to sequencing deficits, completed with step by step verbal/tactile cues.      Pertinent Vitals/Pain Pain Assessment Pain Assessment: PAINAD Breathing: normal Negative Vocalization: none Facial Expression: smiling or inexpressive Body Language: relaxed Consolability: no need to console PAINAD Score: 0     Extremity/Trunk Assessment Upper Extremity Assessment Upper Extremity Assessment: Generalized weakness;Overall [facility] for tasks assessed;Difficult to assess due to impaired cognition   Lower Extremity Assessment Lower Extremity Assessment: Generalized weakness;Difficult to assess due to impaired cognition       Communication Communication Communication: No apparent difficulties Factors Affecting Communication: Difficulty expressing self   Cognition Arousal: Alert Behavior During Therapy: WFL for tasks assessed/performed Cognition: History of cognitive impairments, No family/caregiver present to determine baseline, Cognition impaired   Orientation impairments: Place, Time, Situation Awareness: Intellectual awareness impaired   Attention impairment (select first level of impairment): Focused attention Executive functioning impairment (select all impairments): Initiation, Sequencing, Reasoning, Problem solving OT - Cognition Comments: Oriented to name only, unable to recall birthday on this date                 Following commands: Impaired Following commands impaired: Follows one step commands inconsistently     Cueing  General Comments   Cueing Techniques: Gestural cues;Verbal cues;Tactile cues  Narrow BOS during amb   Exercises Exercises: Other exercises Other Exercises Other Exercises: Edu: Step by step sequencing throughout all tasks, DME and trunk support required for  stability during amb   Shoulder Instructions      Home Living Family/patient expects to be discharged to:: Private residence Living Arrangements: Alone Available Help at Discharge: Family;Available 24 hours/day Type of Home: House                           Additional Comments: Pt is a poor historian and unable to give history. Previous note reports she stays with her daugher during the day and stays with her son at night. Will need to confirm with family.      Prior Functioning/Environment Prior Level of Function : Patient poor historian/Family not available             Mobility Comments: Pt is poor historian, unsure of PLOF. Previous note indicates no AD at baseline      OT Problem List: Impaired balance (sitting and/or standing);Decreased coordination;Decreased safety awareness;Decreased knowledge of use of DME or AE;Decreased cognition;Decreased activity tolerance;Decreased strength;Decreased knowledge of precautions   OT Treatment/Interventions: Energy conservation;DME and/or AE instruction;Therapeutic exercise;Self-care/ADL training;Therapeutic activities;Cognitive remediation/compensation;Patient/family education;Balance training      OT Goals(Current goals can be found in the care plan section)   Acute Rehab OT Goals Patient Stated Goal: feel good OT Goal Formulation: With patient Time For Goal Achievement: 12/15/23 Potential to Achieve Goals: Good ADL Goals Pt Will Perform Grooming: with contact guard assist;standing Pt Will Perform Lower Body Dressing: with min assist;sit to/from stand Pt Will Transfer to Toilet: with contact guard assist;regular height toilet;ambulating Pt Will Perform Toileting - Clothing Manipulation and hygiene: with mod assist;sit to/from stand   OT Frequency:  Min 2X/week    Co-evaluation PT/OT/SLP Co-Evaluation/Treatment: Yes Reason  for Co-Treatment: Complexity of the patient's impairments (multi-system involvement);For  patient/therapist safety;To address functional/ADL transfers   OT goals addressed during session: Proper use of Adaptive equipment and DME;ADL's and self-care      AM-PAC OT "6 Clicks" Daily Activity     Outcome Measure Help from another person eating meals?: A Little Help from another person taking care of personal grooming?: A Lot Help from another person toileting, which includes using toliet, bedpan, or urinal?: A Lot Help from another person bathing (including washing, rinsing, drying)?: Total Help from another person to put on and taking off regular upper body clothing?: A Lot Help from another person to put on and taking off regular lower body clothing?: A Lot 6 Click Score: 12   End of Session Equipment Utilized During Treatment: Rolling walker (2 wheels);Gait belt Nurse Communication: Mobility status  Activity Tolerance: Patient tolerated treatment well Patient left: in bed;with family/visitor present  OT Visit Diagnosis: Unsteadiness on feet (R26.81);Other abnormalities of gait and mobility (R26.89);Repeated falls (R29.6);Muscle weakness (generalized) (M62.81)                Time: 2130-8657 OT Time Calculation (min): 21 min Charges:  OT General Charges $OT Visit: 1 Visit OT Evaluation $OT Eval Moderate Complexity: 1 Mod  Kolden Dupee M.S. OTR/L  12/01/23, 1:51 PM

## 2023-12-01 NOTE — Progress Notes (Signed)
 SLP Cancellation Note  Patient Details Name: Jill Snow MRN: [id number] DOB: [birth year]   Cancelled treatment:       Reason Eval/Treat Not Completed:  (chart reviewed; consulted NSG staff in room, MD.)  Per chart notes, pt is a 89 y.o. female with medical history significant of Advanced Alzheimer's dementia with behavioral disturbance, hypertension, hyperlipidemia, diastolic CHF, A-fib on Eliquis, right bundle blockage, who presents with weakness.  Per her son at the bedside, pt has generalized weakness in the past several days, more confused with agitation. Her urine has foul smell. Cxr- likely atelectasis. Pt admitted w/ Principal Problem:   UTI (urinary tract infection).  Per NSG notes since admission, "Pt continues to jump out of the bed and becoming increasingly agitated. Pt cursing at this RN and pulling off leads. This RN was redirecting patient to get back in bed and patient tried biting this RN.". Pt currently has a Comptroller in the room. Sitter reported pt fed herself the breakfast meal w/ No overt s/s of aspiration noted.  Encouraged reducing distractions during meals and general aspiration precautions. Encouraged water intake via popcicles and lemonade/water, which were provided this visit.   Pt may be presenting at her Cognitive Baseline in setting of Acute illness/UTI. No Acute ST services indicated. Recommend pt/Family f/u for any cognitive-communication assessment/needs/concerns post pt's return to her Known setting and routine/structure and post Acuity of illness/hospitalization. Encouraged more initial monitoring and oversight post D/C d/t this recent illness.       Darla Edward, MS, CCC-SLP Speech Language Pathologist Rehab Services; [facility] [phone] (ascom) Jaidyn Kuhl 12/01/2023, 11:29 AM

## 2023-12-01 NOTE — Progress Notes (Signed)
 PROGRESS NOTE    RITISHA DEITRICK   ZOX:[id number] DOB: [birth year]  DOA: 11/29/2023 Date of Service: 12/01/23 which is hospital day 0  PCP: Antonieta Iba, MD    Hospital course / significant events:   HPI: Jill Snow is a 89 y.o. female with medical history significant of advanced dementia, hypertension, hyperlipidemia, diastolic CHF, A-fib on Eliquis, right bundle blockage, who presents from home with weakness. Per son on admission, pt has generalized weakness in the past several days, more confused with agitation.  Her urine has foul smell.   04/14: admitted to hospitalist for UTI 04/15: required antipsychotics overnight for agitation/sundowning.  04/16: UCx multiple species, will continue Rocephin for now. Continued confusion, difficult to redirect, medications adjusted for patient safety      Consultants:  none  Procedures/Surgeries: none      ASSESSMENT & PLAN:   UTI (urinary tract infection) No fever or leukocytosis. Have ruled out sepsis. UCx multiple species  IV Rocephin --> po keflex    HTN (hypertension) BP at goal for age  Monitor VS   Atrial fibrillation  Heart rate is 72 Amiodarone and Eliquis   Hypercholesterolemia Lipitor   Chronic diastolic CHF (congestive heart failure) 2D echo on 02/11/2023 showed EF 60 to 65% with grade 2 diastolic dysfunction. No CHF exacerbation this admission.  cont  Lasix 20 mg daily, home dose Monitor BMP   Late onset Alzheimer's dementia with behavioral disturbance Continued confusion, difficult to redirect, medications adjusted for patient safety - she has been wandering into other patient rooms  prn Haldol for agitation Prn ativan for severe agitation / sedation  Scheduled at bedtime seroquel and trazodone Fall and delerium precaution Transitioned most meds to po or prns to IM, if lose IV access would leave it out     No concerns based on BMI: Body mass index is 24.81 kg/m.  Underweight -  under 18  overweight - 25 to 29 obese - 30 or more Class 1 obesity: BMI of 30.0 to 34 Class 2 obesity: BMI of 35.0 to 39 Class 3 obesity: BMI of 40.0 to 49 Super Morbid Obesity: BMI 50-59 Super-super Morbid Obesity: BMI 60+ Significantly low or high BMI is associated with higher medical risk.  Weight management advised as adjunct to other disease management and risk reduction treatments    DVT prophylaxis: Eliquis IV fluids: no continuous IV fluids  Nutrition: cardiac diet  Central lines / other devices: none  Code Status: DNR ACP documentation reviewed:  none on file in [facility]  [facility] needs: SNF rehab placement  Medical barriers to dispo: none - can transition to po abx for UTI.              Subjective / Brief ROS:  Patient reports no concern Denies CP/SOB.  Reports no concerns w/ urination/defecation.   Family Communication: called son 12/01/23 5:44 PM and no answer left HIPAA compliant voicemail no urgent updates     Objective Findings:  Vitals:   11/30/23 1749 11/30/23 2030 12/01/23 0500 12/01/23 0515  BP: 99/80 104/66  (!) 116/52  Pulse: 79 67  73  Resp: 15 16  16   Temp: (!) 97.3 F (36.3 C) 97.8 F (36.6 C)  98.4 F (36.9 C)  TempSrc:      SpO2: 100% 94%  97%  Weight:   69.7 kg   Height:        Intake/Output Summary (Last 24 hours) at 12/01/2023 1744 Last data filed at 12/01/2023 0153 Gross per  24 hour  Intake 160 ml  Output --  Net 160 ml   Filed Weights   11/29/23 1939 12/01/23 0500  Weight: 71.5 kg 69.7 kg    Examination:  Physical Exam Constitutional:      General: She is not in acute distress. Cardiovascular:     Rate and Rhythm: Normal rate and regular rhythm.  Pulmonary:     Effort: Pulmonary effort is normal.     Breath sounds: Normal breath sounds.  Abdominal:     Palpations: Abdomen is soft.  Musculoskeletal:     Right lower leg: No edema.     Left lower leg: No edema.  Skin:    General: Skin is warm and dry.   Neurological:     Mental Status: She is alert. Mental status is at baseline. She is disoriented.  Psychiatric:        Mood and Affect: Mood normal.          Scheduled Medications:   amiodarone  200 mg Oral Daily   apixaban  5 mg Oral BID   atorvastatin  40 mg Oral Daily   [START ON 12/02/2023] cephALEXin  500 mg Oral Q8H   feeding supplement  237 mL Oral BID BM   furosemide  20 mg Oral Daily   pantoprazole  40 mg Oral Daily   QUEtiapine  50 mg Oral QHS   traZODone  50 mg Oral QHS    Continuous Infusions:    PRN Medications:  haloperidol lactate, ibuprofen, LORazepam, ondansetron (ZOFRAN) IV, polyethylene glycol  Antimicrobials from admission:  Anti-infectives (From admission, onward)    Start     Dose/Rate Route Frequency Ordered Stop   12/02/23 0600  cephALEXin (KEFLEX) capsule 500 mg        500 mg Oral Every 8 hours 12/01/23 1730     11/30/23 0015  cefTRIAXone (ROCEPHIN) 1 g in sodium chloride 0.9 % 100 mL IVPB  Status:  Discontinued        1 g 200 mL/hr over 30 Minutes Intravenous Every 24 hours 11/30/23 0008 12/01/23 1730           Data Reviewed:  I have personally reviewed the following...  CBC: Recent Labs  Lab 11/29/23 2000 11/30/23 0549 12/01/23 0347  WBC 7.5 5.9 4.9  NEUTROABS 6.4  --   --   HGB 12.4 11.9* 12.8  HCT 37.7 36.8 38.7  MCV 99.0 99.7 98.2  PLT 214 192 175   Basic Metabolic Panel: Recent Labs  Lab 11/29/23 2000 11/30/23 0549 12/01/23 0347  NA 139 140 142  K 4.4 3.4* 4.1  CL 106 109 110  CO2 25 24 25   GLUCOSE 100* 86 69*  BUN 19 17 20   CREATININE 0.80 0.81 0.92  CALCIUM 9.3 8.9 9.2   GFR: Estimated Creatinine Clearance: 39.6 mL/min (by C-G formula based on SCr of 0.92 mg/dL). Liver Function Tests: Recent Labs  Lab 11/29/23 2000 12/01/23 0347  AST 61* 43*  ALT 35 32  ALKPHOS 68 62  BILITOT 1.5* 1.2  PROT 7.0 6.5  ALBUMIN 3.7 3.3*   No results for input(s): "LIPASE", "AMYLASE" in the last 168 hours. No  results for input(s): "AMMONIA" in the last 168 hours. Coagulation Profile: No results for input(s): "INR", "PROTIME" in the last 168 hours. Cardiac Enzymes: No results for input(s): "CKTOTAL", "CKMB", "CKMBINDEX", "TROPONINI" in the last 168 hours. BNP (last 3 results) No results for input(s): "PROBNP" in the last 8760 hours. HbA1C: No results for input(s): "  HGBA1C" in the last 72 hours. CBG: No results for input(s): "GLUCAP" in the last 168 hours. Lipid Profile: No results for input(s): "CHOL", "HDL", "LDLCALC", "TRIG", "CHOLHDL", "LDLDIRECT" in the last 72 hours. Thyroid Function Tests: No results for input(s): "TSH", "T4TOTAL", "FREET4", "T3FREE", "THYROIDAB" in the last 72 hours. Anemia Panel: No results for input(s): "VITAMINB12", "FOLATE", "FERRITIN", "TIBC", "IRON", "RETICCTPCT" in the last 72 hours. Most Recent Urinalysis On File:     Component Value Date/Time   COLORURINE AMBER (A) 11/29/2023 2230   APPEARANCEUR CLOUDY (A) 11/29/2023 2230   APPEARANCEUR Hazy 05/05/2012 0014   LABSPEC 1.011 11/29/2023 2230   LABSPEC 1.017 05/05/2012 0014   PHURINE 7.0 11/29/2023 2230   GLUCOSEU NEGATIVE 11/29/2023 2230   GLUCOSEU NEGATIVE 06/04/2021 1134   HGBUR NEGATIVE 11/29/2023 2230   BILIRUBINUR NEGATIVE 11/29/2023 2230   BILIRUBINUR neg 06/09/2021 1621   BILIRUBINUR Negative 05/05/2012 0014   KETONESUR NEGATIVE 11/29/2023 2230   PROTEINUR NEGATIVE 11/29/2023 2230   UROBILINOGEN 0.2 06/09/2021 1621   UROBILINOGEN 0.2 06/04/2021 1134   NITRITE NEGATIVE 11/29/2023 2230   LEUKOCYTESUR LARGE (A) 11/29/2023 2230   LEUKOCYTESUR 3+ 05/05/2012 0014   Sepsis Labs: @LABRCNTIP (procalcitonin:4,lacticidven:4) Microbiology: Recent Results (from the past 240 hours)  Culture, blood (routine x 2)     Status: None (Preliminary result)   Collection Time: 11/29/23  8:30 PM   Specimen: BLOOD  Result Value Ref Range Status   Specimen Description BLOOD BLOOD LEFT FOREARM  Final   Special  Requests   Final    BOTTLES DRAWN AEROBIC AND ANAEROBIC Blood Culture adequate volume   Culture   Final    NO GROWTH 2 DAYS Performed at [facility], [address] [place], [place] [address]    Report Status PENDING  Incomplete  Culture, blood (routine x 2)     Status: None (Preliminary result)   Collection Time: 11/29/23  8:51 PM   Specimen: BLOOD  Result Value Ref Range Status   Specimen Description BLOOD BLOOD LEFT HAND  Final   Special Requests   Final    BOTTLES DRAWN AEROBIC AND ANAEROBIC Blood Culture results may not be optimal due to an inadequate volume of blood received in culture bottles   Culture   Final    NO GROWTH 2 DAYS Performed at [facility], [address] [place], [place] [address]    Report Status PENDING  Incomplete  Urine Culture     Status: Abnormal   Collection Time: 11/29/23 10:30 PM   Specimen: Urine, Random  Result Value Ref Range Status   Specimen Description   Final    URINE, RANDOM Performed at [facility], [address] [place], [place] [address]    Special Requests   Final    NONE Reflexed from [id number] Performed at [facility], [address] [place], [place] [address]    Culture MULTIPLE SPECIES PRESENT, SUGGEST RECOLLECTION (A)  Final   Report Status 12/01/2023 FINAL  Final      Radiology Studies last 3 days: CT Head Wo Contrast Result Date: 11/29/2023 CLINICAL DATA:  Altered mental status EXAM: CT HEAD WITHOUT CONTRAST TECHNIQUE: Contiguous axial images were obtained from the base of the skull through the vertex without intravenous contrast. RADIATION DOSE REDUCTION: This exam was performed according to the departmental dose-optimization program which includes automated exposure control, adjustment of the mA and/or kV according to patient size and/or use of iterative reconstruction technique. COMPARISON:  02/10/2023 FINDINGS: Brain: No evidence of acute  infarction, hemorrhage, hydrocephalus,  extra-axial collection or mass lesion/mass effect. Mild atrophic and chronic white matter ischemic changes are noted. Vascular: No hyperdense vessel or unexpected calcification. Skull: Normal. Negative for fracture or focal lesion. Sinuses/Orbits: No acute finding. Other: None. IMPRESSION: Chronic atrophic and ischemic changes. Electronically Signed   By: Violeta Grey M.D.   On: 11/29/2023 22:08   DG Chest 1 View Result Date: 11/29/2023 CLINICAL DATA:  Altered mental status EXAM: CHEST  1 VIEW COMPARISON:  Chest x-ray 02/10/2023 FINDINGS: Heart is enlarged. There is a small left pleural effusion and minimal left basilar patchy opacities. Right lung is clear. No pneumothorax or acute fracture. IMPRESSION: 1. Small left pleural effusion and minimal left basilar patchy opacities, likely atelectasis. 2. Cardiomegaly. Electronically Signed   By: Tyron Gallon M.D.   On: 11/29/2023 21:49       Time spent: 35 min     Melodi Sprung, DO [facility] 12/01/2023, 5:44 PM    Dictation software may have been used to generate the above note. Typos may occur and escape review in typed/dictated notes. Please contact Dr Authur Leghorn directly for clarity if needed.  Staff may message me via secure chat in Epic  but this may not receive an immediate response,  please page me for urgent matters!  If 7PM-7AM, please contact night coverage [email or web address]

## 2023-12-01 NOTE — Progress Notes (Signed)
 Physical Therapy Treatment Patient Details Name: Jill Snow MRN: [id number] DOB: [birth year] Today's Date: 12/01/2023   History of Present Illness Pt admitted for UTI. Pt with complaints of weakness and agitation. History of advanced dementia, HTN, and HLD.    PT Comments  PT/OT co-treatment performed this date. Pt more alert with ability to keep eyes open and participate. Able to follow commands with increased time and cues. +2 for safety with mobility due to cognition impairment. Gait attempted with RW and without AD with not a huge improvement in balance. Continue to recommend AD for mobility needs. Left seated at edge of bed with sitter present. Will continue to progress as able.    If plan is discharge home, recommend the following: Two people to help with walking and/or transfers;A lot of help with bathing/dressing/bathroom;Supervision due to cognitive status   Can travel by private vehicle     Yes  Equipment Recommendations   (TBD)    Recommendations for Other Services       Precautions / Restrictions Precautions Precautions: Fall Recall of Precautions/Restrictions: Impaired Restrictions Weight Bearing Restrictions Per Provider Order: No     Mobility  Bed Mobility               General bed mobility comments: Seated on EOB on arrival to room with sitter present, return to sitting on EOB, verbal cues and HHA for steps up the bed to retire in more secure seated position.    Transfers Overall transfer level: Needs assistance Equipment used: Rolling walker (2 wheels), 1 person hand held assist Transfers: Sit to/from Stand, Bed to chair/wheelchair/BSC Sit to Stand: Min assist, Contact guard assist, +2 safety/equipment           General transfer comment: needs heavy cues for sequencing including task initiation. Once standing, fair balance noted with narrow BOS and flexed posture.    Ambulation/Gait Ambulation/Gait assistance: Min assist, +2  safety/equipment Gait Distance (Feet): 60 Feet Assistive device: Rolling walker (2 wheels) Gait Pattern/deviations: Step-through pattern       General Gait Details: ambulated using reciprocal gait pattern. Needs guidance for RW management including propelling RW and cues for staying close to BOS. Very narrow support during turns, unable to sequencing without physical assist   Stairs             Wheelchair Mobility     Tilt Bed    Modified Rankin (Stroke Patients Only)       Balance Overall balance assessment: Needs assistance Sitting-balance support: Feet supported Sitting balance-Leahy Scale: Good     Standing balance support: Bilateral upper extremity supported, Single extremity supported, During functional activity Standing balance-Leahy Scale: Poor                              Communication Communication Communication: No apparent difficulties Factors Affecting Communication: Difficulty expressing self  Cognition Arousal: Alert Behavior During Therapy: WFL for tasks assessed/performed   PT - Cognitive impairments: History of cognitive impairments                       PT - Cognition Comments: pt is confused at baseline. Calm and agreeable. Pt unable to state full name or birthday. Unable to state location. Following commands: Impaired Following commands impaired: Follows one step commands inconsistently    Cueing Cueing Techniques: Gestural cues, Verbal cues, Tactile cues  Exercises Other Exercises Other Exercises: ambulated to bathroom with +2 assist  for safety. Needs max assist for hygiene and cga for standing tolerance at sink for hygiene    General Comments General comments (skin integrity, edema, etc.): Narrow BOS during amb      Pertinent Vitals/Pain Pain Assessment Pain Assessment: No/denies pain    Home Living Family/patient expects to be discharged to:: Private residence Living Arrangements: Alone Available Help at  Discharge: Family;Available 24 hours/day Type of Home: House             Additional Comments: Pt is a poor historian and unable to give history. Previous note reports she stays with her daugher during the day and stays with her son at night. Will need to confirm with family.    Prior Function            PT Goals (current goals can now be found in the care plan section) Acute Rehab PT Goals Patient Stated Goal: pt unable to state PT Goal Formulation: Patient unable to participate in goal setting Time For Goal Achievement: 12/14/23 Potential to Achieve Goals: Fair Progress towards PT goals: Progressing toward goals    Frequency    Min 2X/week      PT Plan      Co-evaluation PT/OT/SLP Co-Evaluation/Treatment: Yes Reason for Co-Treatment: Complexity of the patient's impairments (multi-system involvement);For patient/therapist safety;To address functional/ADL transfers PT goals addressed during session: Mobility/safety with mobility OT goals addressed during session: Proper use of Adaptive equipment and DME;ADL's and self-care      AM-PAC PT "6 Clicks" Mobility   Outcome Measure  Help needed turning from your back to your side while in a flat bed without using bedrails?: A Little Help needed moving from lying on your back to sitting on the side of a flat bed without using bedrails?: A Little Help needed moving to and from a bed to a chair (including a wheelchair)?: A Lot Help needed standing up from a chair using your arms (e.g., wheelchair or bedside chair)?: A Lot Help needed to walk in hospital room?: A Lot Help needed climbing 3-5 steps with a railing? : Total 6 Click Score: 13    End of Session   Activity Tolerance: Patient tolerated treatment well Patient left:  (seated at EOB with sitter present) Nurse Communication: Mobility status PT Visit Diagnosis: Muscle weakness (generalized) (M62.81);Unsteadiness on feet (R26.81);Difficulty in walking, not elsewhere  classified (R26.2)     Time: 6644-0347 PT Time Calculation (min) (ACUTE ONLY): 21 min  Charges:    $Gait Training: 8-22 mins PT General Charges $$ ACUTE PT VISIT: 1 Visit                     Amparo Balk, PT, DPT, GCS [phone]    Andre Swander 12/01/2023, 3:01 PM

## 2023-12-02 DIAGNOSIS — N3000 Acute cystitis without hematuria: Secondary | ICD-10-CM | POA: Diagnosis not present

## 2023-12-02 DIAGNOSIS — R000 Tachycardia, unspecified: Secondary | ICD-10-CM | POA: Diagnosis not present

## 2023-12-02 LAB — MAGNESIUM: Magnesium: 2.2 mg/dL (ref 1.7–2.4)

## 2023-12-02 NOTE — Plan of Care (Signed)
  Problem: Education: Goal: Knowledge of General Education information will improve Description: Including pain rating scale, medication(s)/side effects and non-pharmacologic comfort measures Outcome: Not Applicable   Problem: Health Behavior/Discharge Planning: Goal: Ability to manage health-related needs will improve Outcome: Not Applicable

## 2023-12-02 NOTE — Progress Notes (Signed)
 PROGRESS NOTE    Jill Snow   WUJ:[id number] DOB: [birth year]  DOA: 11/29/2023 Date of Service: 12/02/23 which is hospital day 0  PCP: Antonieta Iba, MD    Hospital course / significant events:   HPI: Jill Snow is a 89 y.o. female with medical history significant of advanced dementia, hypertension, hyperlipidemia, diastolic CHF, A-fib on Eliquis, right bundle blockage, who presents from home with weakness. Per son on admission, pt has generalized weakness in the past several days, more confused with agitation.  Her urine has foul smell.   04/14: admitted to hospitalist for UTI 04/15: required antipsychotics overnight for agitation/sundowning.  04/16: UCx multiple species, will continue abx to complete total 5 days. Continued confusion, difficult to redirect, medications adjusted for patient safety and transition to po as able. Needs SNF placement 04/17: no TOC note in chart as of this morning, placement pending      Consultants:  none  Procedures/Surgeries: none      ASSESSMENT & PLAN:   UTI (urinary tract infection) No fever or leukocytosis. Have ruled out sepsis. UCx multiple species  IV Rocephin --> po keflex    HTN (hypertension) BP at goal for age  Monitor VS   Atrial fibrillation  Heart rate is 72 Amiodarone and Eliquis   Hypercholesterolemia Lipitor   Chronic diastolic CHF (congestive heart failure) 2D echo on 02/11/2023 showed EF 60 to 65% with grade 2 diastolic dysfunction. No CHF exacerbation this admission.  cont  Lasix 20 mg daily, home dose Monitor BMP   Late onset Alzheimer's dementia with behavioral disturbance Continued confusion, difficult to redirect, medications adjusted for patient safety - she has been wandering into other patient rooms  prn Haldol for agitation Prn ativan for severe agitation / sedation  Scheduled at bedtime seroquel and trazodone Fall and delerium precaution Transitioned most meds to po or prns  to IM, if lose IV access would leave it out     No concerns based on BMI: Body mass index is 24.81 kg/m.  Underweight - under 18  overweight - 25 to 29 obese - 30 or more Class 1 obesity: BMI of 30.0 to 34 Class 2 obesity: BMI of 35.0 to 39 Class 3 obesity: BMI of 40.0 to 49 Super Morbid Obesity: BMI 50-59 Super-super Morbid Obesity: BMI 60+ Significantly low or high BMI is associated with higher medical risk.  Weight management advised as adjunct to other disease management and risk reduction treatments    DVT prophylaxis: Eliquis IV fluids: no continuous IV fluids  Nutrition: cardiac diet  Central lines / other devices: none  Code Status: DNR ACP documentation reviewed:  none on file in [facility]  TOC needs: SNF rehab placement  Medical barriers to dispo: none - transition to po abx for UTI, dementia may be barrier but she is baseline/stable in this regard             Subjective / Brief ROS:  Patient reports no concern She is at baseline confusion but does not appear agitated  Denies CP/SOB.  Reports no concerns w/ urination/defecation.   Family Communication: called son 12/02/23 1:33 PM and spoke on phone all questions answered     Objective Findings:  Vitals:   12/01/23 0515 12/01/23 2059 12/02/23 0355 12/02/23 0852  BP: (!) 116/52 99/64 102/64 107/63  Pulse: 73 87 71 61  Resp: 16 18 18 20   Temp: 98.4 F (36.9 C)  98 F (36.7 C) 97.7 F (36.5 C)  TempSrc:  SpO2: 97% 100% 100% 100%  Weight:      Height:        Intake/Output Summary (Last 24 hours) at 12/02/2023 1333 Last data filed at 12/02/2023 0900 Gross per 24 hour  Intake 0 ml  Output --  Net 0 ml   Filed Weights   11/29/23 1939 12/01/23 0500  Weight: 71.5 kg 69.7 kg    Examination:  Physical Exam Constitutional:      General: She is not in acute distress. Cardiovascular:     Rate and Rhythm: Normal rate and regular rhythm.  Pulmonary:     Effort: Pulmonary effort is normal.      Breath sounds: Normal breath sounds.  Abdominal:     Palpations: Abdomen is soft.  Musculoskeletal:     Right lower leg: No edema.     Left lower leg: No edema.  Skin:    General: Skin is warm and dry.  Neurological:     Mental Status: She is alert. Mental status is at baseline. She is disoriented.  Psychiatric:        Mood and Affect: Mood normal.          Scheduled Medications:   amiodarone  200 mg Oral Daily   apixaban  5 mg Oral BID   atorvastatin  40 mg Oral Daily   cephALEXin  500 mg Oral Q8H   feeding supplement  237 mL Oral BID BM   furosemide  20 mg Oral Daily   pantoprazole  40 mg Oral Daily   QUEtiapine  50 mg Oral QHS   traZODone  50 mg Oral QHS    Continuous Infusions:    PRN Medications:  haloperidol lactate, ibuprofen, LORazepam, ondansetron (ZOFRAN) IV, polyethylene glycol  Antimicrobials from admission:  Anti-infectives (From admission, onward)    Start     Dose/Rate Route Frequency Ordered Stop   12/02/23 0600  cephALEXin (KEFLEX) capsule 500 mg        500 mg Oral Every 8 hours 12/01/23 1730     11/30/23 0015  cefTRIAXone (ROCEPHIN) 1 g in sodium chloride 0.9 % 100 mL IVPB  Status:  Discontinued        1 g 200 mL/hr over 30 Minutes Intravenous Every 24 hours 11/30/23 0008 12/01/23 1730           Data Reviewed:  I have personally reviewed the following...  CBC: Recent Labs  Lab 11/29/23 2000 11/30/23 0549 12/01/23 0347  WBC 7.5 5.9 4.9  NEUTROABS 6.4  --   --   HGB 12.4 11.9* 12.8  HCT 37.7 36.8 38.7  MCV 99.0 99.7 98.2  PLT 214 192 175   Basic Metabolic Panel: Recent Labs  Lab 11/29/23 2000 11/30/23 0549 12/01/23 0347  NA 139 140 142  K 4.4 3.4* 4.1  CL 106 109 110  CO2 25 24 25   GLUCOSE 100* 86 69*  BUN 19 17 20   CREATININE 0.80 0.81 0.92  CALCIUM 9.3 8.9 9.2   GFR: Estimated Creatinine Clearance: 39.6 mL/min (by C-G formula based on SCr of 0.92 mg/dL). Liver Function Tests: Recent Labs  Lab  11/29/23 2000 12/01/23 0347  AST 61* 43*  ALT 35 32  ALKPHOS 68 62  BILITOT 1.5* 1.2  PROT 7.0 6.5  ALBUMIN 3.7 3.3*   No results for input(s): "LIPASE", "AMYLASE" in the last 168 hours. No results for input(s): "AMMONIA" in the last 168 hours. Coagulation Profile: No results for input(s): "INR", "PROTIME" in the last 168 hours. Cardiac  Enzymes: No results for input(s): "CKTOTAL", "CKMB", "CKMBINDEX", "TROPONINI" in the last 168 hours. BNP (last 3 results) No results for input(s): "PROBNP" in the last 8760 hours. HbA1C: No results for input(s): "HGBA1C" in the last 72 hours. CBG: No results for input(s): "GLUCAP" in the last 168 hours. Lipid Profile: No results for input(s): "CHOL", "HDL", "LDLCALC", "TRIG", "CHOLHDL", "LDLDIRECT" in the last 72 hours. Thyroid Function Tests: No results for input(s): "TSH", "T4TOTAL", "FREET4", "T3FREE", "THYROIDAB" in the last 72 hours. Anemia Panel: No results for input(s): "VITAMINB12", "FOLATE", "FERRITIN", "TIBC", "IRON", "RETICCTPCT" in the last 72 hours. Most Recent Urinalysis On File:     Component Value Date/Time   COLORURINE AMBER (A) 11/29/2023 2230   APPEARANCEUR CLOUDY (A) 11/29/2023 2230   APPEARANCEUR Hazy 05/05/2012 0014   LABSPEC 1.011 11/29/2023 2230   LABSPEC 1.017 05/05/2012 0014   PHURINE 7.0 11/29/2023 2230   GLUCOSEU NEGATIVE 11/29/2023 2230   GLUCOSEU NEGATIVE 06/04/2021 1134   HGBUR NEGATIVE 11/29/2023 2230   BILIRUBINUR NEGATIVE 11/29/2023 2230   BILIRUBINUR neg 06/09/2021 1621   BILIRUBINUR Negative 05/05/2012 0014   KETONESUR NEGATIVE 11/29/2023 2230   PROTEINUR NEGATIVE 11/29/2023 2230   UROBILINOGEN 0.2 06/09/2021 1621   UROBILINOGEN 0.2 06/04/2021 1134   NITRITE NEGATIVE 11/29/2023 2230   LEUKOCYTESUR LARGE (A) 11/29/2023 2230   LEUKOCYTESUR 3+ 05/05/2012 0014   Sepsis Labs: @LABRCNTIP (procalcitonin:4,lacticidven:4) Microbiology: Recent Results (from the past 240 hours)  Culture, blood (routine x  2)     Status: None (Preliminary result)   Collection Time: 11/29/23  8:30 PM   Specimen: BLOOD  Result Value Ref Range Status   Specimen Description BLOOD BLOOD LEFT FOREARM  Final   Special Requests   Final    BOTTLES DRAWN AEROBIC AND ANAEROBIC Blood Culture adequate volume   Culture   Final    NO GROWTH 3 DAYS Performed at [facility], [address], [place], [place] [address]    Report Status PENDING  Incomplete  Culture, blood (routine x 2)     Status: None (Preliminary result)   Collection Time: 11/29/23  8:51 PM   Specimen: BLOOD  Result Value Ref Range Status   Specimen Description BLOOD BLOOD LEFT HAND  Final   Special Requests   Final    BOTTLES DRAWN AEROBIC AND ANAEROBIC Blood Culture results may not be optimal due to an inadequate volume of blood received in culture bottles   Culture   Final    NO GROWTH 3 DAYS Performed at [facility], [address], [place], [place] [address]    Report Status PENDING  Incomplete  Urine Culture     Status: Abnormal   Collection Time: 11/29/23 10:30 PM   Specimen: Urine, Random  Result Value Ref Range Status   Specimen Description   Final    URINE, RANDOM Performed at [facility], [address] [place], [place] [address]    Special Requests   Final    NONE Reflexed from [id number] Performed at [facility], [address] [place], [place] [address]    Culture MULTIPLE SPECIES PRESENT, SUGGEST RECOLLECTION (A)  Final   Report Status 12/01/2023 FINAL  Final      Radiology Studies last 3 days: CT Head Wo Contrast Result Date: 11/29/2023 CLINICAL DATA:  Altered mental status EXAM: CT HEAD WITHOUT CONTRAST TECHNIQUE: Contiguous axial images were obtained from the base of the skull through the vertex without intravenous contrast. RADIATION DOSE REDUCTION: This exam was performed according to the  departmental dose-optimization program which includes automated exposure control, adjustment of  the mA and/or kV according to patient size and/or use of iterative reconstruction technique. COMPARISON:  02/10/2023 FINDINGS: Brain: No evidence of acute infarction, hemorrhage, hydrocephalus, extra-axial collection or mass lesion/mass effect. Mild atrophic and chronic white matter ischemic changes are noted. Vascular: No hyperdense vessel or unexpected calcification. Skull: Normal. Negative for fracture or focal lesion. Sinuses/Orbits: No acute finding. Other: None. IMPRESSION: Chronic atrophic and ischemic changes. Electronically Signed   By: Violeta Grey M.D.   On: 11/29/2023 22:08   DG Chest 1 View Result Date: 11/29/2023 CLINICAL DATA:  Altered mental status EXAM: CHEST  1 VIEW COMPARISON:  Chest x-ray 02/10/2023 FINDINGS: Heart is enlarged. There is a small left pleural effusion and minimal left basilar patchy opacities. Right lung is clear. No pneumothorax or acute fracture. IMPRESSION: 1. Small left pleural effusion and minimal left basilar patchy opacities, likely atelectasis. 2. Cardiomegaly. Electronically Signed   By: Tyron Gallon M.D.   On: 11/29/2023 21:49       Time spent: 35 min     Angeleena Dueitt, DO [facility] 12/02/2023, 1:33 PM    Dictation software may have been used to generate the above note. Typos may occur and escape review in typed/dictated notes. Please contact Dr Authur Leghorn directly for clarity if needed.  Staff may message me via secure chat in Epic  but this may not receive an immediate response,  please page me for urgent matters!  If 7PM-7AM, please contact night coverage [email or web address]

## 2023-12-02 NOTE — Progress Notes (Signed)
 Physical Therapy Treatment Patient Details Name: Jill Snow MRN: [id number] DOB: Feb 29, 1934 Today's Date: 12/02/2023   History of Present Illness Pt admitted for UTI. Pt with complaints of weakness and agitation. History of advanced dementia, HTN, and HLD.    PT Comments  Pt is making good progress towards goals with ability to ambulate this date with 1 assist and RW. Improved tolerance for distance and decreased cues required for sequencing. Still remains confused, however pleasant. Will continue to progress towards goals. Sitter at bedside.    If plan is discharge home, recommend the following: Supervision due to cognitive status;A little help with walking and/or transfers;A little help with bathing/dressing/bathroom   Can travel by private vehicle     Yes  Equipment Recommendations   (TBD)    Recommendations for Other Services       Precautions / Restrictions Precautions Precautions: Fall Recall of Precautions/Restrictions: Impaired Restrictions Weight Bearing Restrictions Per Provider Order: No     Mobility  Bed Mobility               General bed mobility comments: beginning/ending session in recliner. Bed mobility not performed    Transfers Overall transfer level: Needs assistance Equipment used: Rolling walker (2 wheels), 1 person hand held assist Transfers: Sit to/from Stand, Bed to chair/wheelchair/BSC Sit to Stand: Min assist           General transfer comment: needs cues for sequencing and task initiation. Once standing, upright posture noted    Ambulation/Gait Ambulation/Gait assistance: Min assist Gait Distance (Feet): 70 Feet Assistive device: Rolling walker (2 wheels) Gait Pattern/deviations: Step-through pattern       General Gait Details: ambulated with reciprocal gait pattern and smooth technique. Decreased cues required for initiation although does still demonstrate difficulty with turns using RW, often getting feet tangled  up.   Stairs             Wheelchair Mobility     Tilt Bed    Modified Rankin (Stroke Patients Only)       Balance Overall balance assessment: Needs assistance Sitting-balance support: Feet supported Sitting balance-Leahy Scale: Good     Standing balance support: Bilateral upper extremity supported, Single extremity supported, During functional activity Standing balance-Leahy Scale: Fair                              Hotel manager: No apparent difficulties Factors Affecting Communication: Difficulty expressing self  Cognition Arousal: Alert Behavior During Therapy: WFL for tasks assessed/performed   PT - Cognitive impairments: History of cognitive impairments                       PT - Cognition Comments: calm and agreeable to session. Confused at baseline. Does appear to recognize son at bedside Following commands: Impaired Following commands impaired: Follows one step commands inconsistently    Cueing Cueing Techniques: Gestural cues, Verbal cues, Tactile cues  Exercises Other Exercises Other Exercises: ambulated to bathroom with min assist. Able to transfer on/off toilet with assistance, unable to void at this time.    General Comments        Pertinent Vitals/Pain Pain Assessment Pain Assessment: No/denies pain    Home Living                          Prior Function  PT Goals (current goals can now be found in the care plan section) Acute Rehab PT Goals Patient Stated Goal: pt unable to state PT Goal Formulation: Patient unable to participate in goal setting Time For Goal Achievement: 12/14/23 Potential to Achieve Goals: Fair Progress towards PT goals: Progressing toward goals    Frequency    Min 2X/week      PT Plan      Co-evaluation              AM-PAC PT "6 Clicks" Mobility   Outcome Measure  Help needed turning from your back to your side while in a  flat bed without using bedrails?: A Little Help needed moving from lying on your back to sitting on the side of a flat bed without using bedrails?: A Little Help needed moving to and from a bed to a chair (including a wheelchair)?: A Little Help needed standing up from a chair using your arms (e.g., wheelchair or bedside chair)?: A Little Help needed to walk in hospital room?: A Little Help needed climbing 3-5 steps with a railing? : A Lot 6 Click Score: 17    End of Session Equipment Utilized During Treatment: Gait belt Activity Tolerance: Patient tolerated treatment well Patient left: in chair (with sitter at bedside) Nurse Communication: Mobility status PT Visit Diagnosis: Muscle weakness (generalized) (M62.81);Unsteadiness on feet (R26.81);Difficulty in walking, not elsewhere classified (R26.2)     Time: 1610-9604 PT Time Calculation (min) (ACUTE ONLY): 14 min  Charges:    $Gait Training: 8-22 mins PT General Charges $$ ACUTE PT VISIT: 1 Visit                     Amparo Balk, PT, DPT, GCS [phone]    Mashawn Brazil 12/02/2023, 2:56 PM

## 2023-12-02 NOTE — Plan of Care (Addendum)
    Significant event: Rapid A-fib      CROSS COVER NOTE  NAME: LYNCOLN MASKELL MRN: [id number] DOB : [birth year]    Concern as stated by nurse / staff   Ms Bassett is a yellow MEWS for her HR. I palpated a pulse of 128. Her HR has been in the 60`s majority of the day. BP 108/75. She was admitted on 4/14 and we are treating her for UTI. PMH: A fib, dementia, HTN, RBBB, CHF, HLD. She is not on telemetry and she has not had a mag level this admission.      Pertinent findings on chart review:    12/02/2023    8:32 PM 12/02/2023    4:17 PM 12/02/2023    8:52 AM  Vitals with BMI  Systolic 108 108 409  Diastolic 75 42 63  Pulse 128 60 61     Patient assessment Patient seen.  Sitter at bedside.  She appears comfortable Physical Exam Vitals and nursing note reviewed.  Constitutional:      General: She is not in acute distress. HENT:     Head: Normocephalic and atraumatic.  Cardiovascular:     Rate and Rhythm: Regular rhythm. Tachycardia present.     Heart sounds: Normal heart sounds.  Pulmonary:     Effort: Pulmonary effort is normal.     Breath sounds: Normal breath sounds.  Abdominal:     Palpations: Abdomen is soft.     Tenderness: There is no abdominal tenderness.  Neurological:     Mental Status: Mental status is at baseline.      Assessment and  Interventions   Assessment:  A fib with RVR  Plan: Telemetry monitoring Continue amiodarone  Will consider IV metoprolol  if sustaining    reassessment    12/03/2023    2:27 AM 12/02/2023   10:16 PM 12/02/2023    8:32 PM  Vitals with BMI  Systolic 106 107 811  Diastolic 68 60 75  Pulse 140 145 128   -Will try a fluid bolus -One-time metoprolol  dose Continue to monitor    CRITICAL CARE Performed by: Lanetta Pion   Total critical care time: 45 minutes  Critical care time was exclusive of separately billable procedures and treating other patients.  Critical care was necessary to treat or prevent  imminent or life-threatening deterioration.  Critical care was time spent personally by me on the following activities: development of treatment plan with patient and/or surrogate as well as nursing, discussions with consultants, evaluation of patient's response to treatment, examination of patient, obtaining history from patient or surrogate, ordering and performing treatments and interventions, ordering and review of laboratory studies, ordering and review of radiographic studies, pulse oximetry and re-evaluation of patient's condition.

## 2023-12-02 NOTE — Plan of Care (Signed)

## 2023-12-03 ENCOUNTER — Observation Stay

## 2023-12-03 DIAGNOSIS — I5032 Chronic diastolic (congestive) heart failure: Secondary | ICD-10-CM | POA: Diagnosis present

## 2023-12-03 DIAGNOSIS — I10 Essential (primary) hypertension: Secondary | ICD-10-CM | POA: Diagnosis not present

## 2023-12-03 DIAGNOSIS — Z885 Allergy status to narcotic agent status: Secondary | ICD-10-CM | POA: Diagnosis not present

## 2023-12-03 DIAGNOSIS — R531 Weakness: Secondary | ICD-10-CM | POA: Diagnosis not present

## 2023-12-03 DIAGNOSIS — N3000 Acute cystitis without hematuria: Secondary | ICD-10-CM | POA: Diagnosis not present

## 2023-12-03 DIAGNOSIS — Z823 Family history of stroke: Secondary | ICD-10-CM | POA: Diagnosis not present

## 2023-12-03 DIAGNOSIS — F02818 Dementia in other diseases classified elsewhere, unspecified severity, with other behavioral disturbance: Secondary | ICD-10-CM | POA: Diagnosis present

## 2023-12-03 DIAGNOSIS — Z604 Social exclusion and rejection: Secondary | ICD-10-CM | POA: Diagnosis present

## 2023-12-03 DIAGNOSIS — Z8249 Family history of ischemic heart disease and other diseases of the circulatory system: Secondary | ICD-10-CM | POA: Diagnosis not present

## 2023-12-03 DIAGNOSIS — Z888 Allergy status to other drugs, medicaments and biological substances status: Secondary | ICD-10-CM | POA: Diagnosis not present

## 2023-12-03 DIAGNOSIS — Z880 Allergy status to penicillin: Secondary | ICD-10-CM | POA: Diagnosis not present

## 2023-12-03 DIAGNOSIS — I480 Paroxysmal atrial fibrillation: Secondary | ICD-10-CM | POA: Diagnosis present

## 2023-12-03 DIAGNOSIS — I4430 Unspecified atrioventricular block: Secondary | ICD-10-CM | POA: Diagnosis not present

## 2023-12-03 DIAGNOSIS — Z79899 Other long term (current) drug therapy: Secondary | ICD-10-CM | POA: Diagnosis not present

## 2023-12-03 DIAGNOSIS — I482 Chronic atrial fibrillation, unspecified: Secondary | ICD-10-CM | POA: Diagnosis present

## 2023-12-03 DIAGNOSIS — G301 Alzheimer's disease with late onset: Secondary | ICD-10-CM | POA: Diagnosis present

## 2023-12-03 DIAGNOSIS — Z66 Do not resuscitate: Secondary | ICD-10-CM | POA: Diagnosis present

## 2023-12-03 DIAGNOSIS — F05 Delirium due to known physiological condition: Secondary | ICD-10-CM | POA: Diagnosis present

## 2023-12-03 DIAGNOSIS — Z8261 Family history of arthritis: Secondary | ICD-10-CM | POA: Diagnosis not present

## 2023-12-03 DIAGNOSIS — I110 Hypertensive heart disease with heart failure: Secondary | ICD-10-CM | POA: Diagnosis present

## 2023-12-03 DIAGNOSIS — Z833 Family history of diabetes mellitus: Secondary | ICD-10-CM | POA: Diagnosis not present

## 2023-12-03 DIAGNOSIS — I4892 Unspecified atrial flutter: Secondary | ICD-10-CM | POA: Diagnosis present

## 2023-12-03 DIAGNOSIS — R000 Tachycardia, unspecified: Secondary | ICD-10-CM

## 2023-12-03 DIAGNOSIS — E7800 Pure hypercholesterolemia, unspecified: Secondary | ICD-10-CM | POA: Diagnosis present

## 2023-12-03 DIAGNOSIS — Z83438 Family history of other disorder of lipoprotein metabolism and other lipidemia: Secondary | ICD-10-CM | POA: Diagnosis not present

## 2023-12-03 DIAGNOSIS — I4891 Unspecified atrial fibrillation: Secondary | ICD-10-CM | POA: Diagnosis not present

## 2023-12-03 DIAGNOSIS — N390 Urinary tract infection, site not specified: Secondary | ICD-10-CM | POA: Diagnosis present

## 2023-12-03 DIAGNOSIS — Z7901 Long term (current) use of anticoagulants: Secondary | ICD-10-CM | POA: Diagnosis not present

## 2023-12-03 LAB — CBC WITH DIFFERENTIAL/PLATELET
Abs Immature Granulocytes: 0.02 10*3/uL (ref 0.00–0.07)
Basophils Absolute: 0 10*3/uL (ref 0.0–0.1)
Basophils Relative: 0 %
Eosinophils Absolute: 0.1 10*3/uL (ref 0.0–0.5)
Eosinophils Relative: 2 %
HCT: 38.4 % (ref 36.0–46.0)
Hemoglobin: 12.6 g/dL (ref 12.0–15.0)
Immature Granulocytes: 0 %
Lymphocytes Relative: 9 %
Lymphs Abs: 0.6 10*3/uL — ABNORMAL LOW (ref 0.7–4.0)
MCH: 32.7 pg (ref 26.0–34.0)
MCHC: 32.8 g/dL (ref 30.0–36.0)
MCV: 99.7 fL (ref 80.0–100.0)
Monocytes Absolute: 0.4 10*3/uL (ref 0.1–1.0)
Monocytes Relative: 7 %
Neutro Abs: 5.4 10*3/uL (ref 1.7–7.7)
Neutrophils Relative %: 82 %
Platelets: 216 10*3/uL (ref 150–400)
RBC: 3.85 MIL/uL — ABNORMAL LOW (ref 3.87–5.11)
RDW: 13.1 % (ref 11.5–15.5)
WBC: 6.6 10*3/uL (ref 4.0–10.5)
nRBC: 0 % (ref 0.0–0.2)

## 2023-12-03 LAB — COMPREHENSIVE METABOLIC PANEL WITH GFR
ALT: 28 U/L (ref 0–44)
AST: 25 U/L (ref 15–41)
Albumin: 3.1 g/dL — ABNORMAL LOW (ref 3.5–5.0)
Alkaline Phosphatase: 61 U/L (ref 38–126)
Anion gap: 8 (ref 5–15)
BUN: 19 mg/dL (ref 8–23)
CO2: 26 mmol/L (ref 22–32)
Calcium: 9.1 mg/dL (ref 8.9–10.3)
Chloride: 106 mmol/L (ref 98–111)
Creatinine, Ser: 0.82 mg/dL (ref 0.44–1.00)
GFR, Estimated: >60 mL/min (ref >60)
Glucose, Bld: 128 mg/dL — ABNORMAL HIGH (ref 70–99)
Potassium: 4.2 mmol/L (ref 3.5–5.1)
Sodium: 140 mmol/L (ref 135–145)
Total Bilirubin: 1.1 mg/dL (ref 0.0–1.2)
Total Protein: 6.4 g/dL — ABNORMAL LOW (ref 6.5–8.1)

## 2023-12-03 MED ORDER — SODIUM CHLORIDE 0.9 % IV BOLUS
500.0000 mL | Freq: Once | INTRAVENOUS | Status: AC
Start: 2023-12-03 — End: 2023-12-03
  Administered 2023-12-03: 500 mL via INTRAVENOUS

## 2023-12-03 MED ORDER — METOPROLOL TARTRATE 5 MG/5ML IV SOLN
2.5000 mg | Freq: Once | INTRAVENOUS | Status: AC | PRN
  Administered 2023-12-03: 2.5 mg via INTRAVENOUS
  Filled 2023-12-03: qty 5

## 2023-12-03 MED ORDER — LACTATED RINGERS IV SOLN: INTRAVENOUS | Status: AC

## 2023-12-03 NOTE — TOC CM/SW Note (Signed)
 The above named patient is recommended to go to Short Term Rehab for strengthening and gait training for balance.  It is expected that the Short Term Rehab stay will be less than 30 days.  The patient is expected to return home after Rehab.

## 2023-12-03 NOTE — NC FL2 (Signed)
 [place]  MEDICAID FL2 LEVEL OF CARE FORM     IDENTIFICATION  Patient Name: Jill Snow Birthdate: 1933/10/12 Sex: female Admission Date (Current Location): 11/29/2023  [facility] and [place] Number:  Chiropodist and Address:  [facility], [address], [place], [place] [address]      Provider Number: [id number]  Attending Physician Name and Address:  Melodi Sprung, DO  Relative Name and Phone Number:       Current Level of Care: Hospital Recommended Level of Care: Skilled Nursing Facility Prior Approval Number:    Date Approved/Denied:   PASRR Number:    Discharge Plan: SNF    Current Diagnoses: Patient Active Problem List   Diagnosis Date Noted   Encounter for general adult medical examination with abnormal findings 08/23/2023   Slurred speech 02/26/2023   Fall 02/10/2023   Thrombocytopenia (HCC) 01/05/2023   UTI (urinary tract infection) 12/25/2022   AMS (altered mental status) 12/25/2022   Venous insufficiency 11/10/2022   Cellulitis 03/23/2022   Encounter for dental examination and cleaning without abnormal findings 09/17/2021   Late onset Alzheimer's dementia with behavioral disturbance (HCC) 06/03/2021   Urinary urgency 05/26/2021   Tremor of right hand 05/26/2021   Dry eyes 12/24/2020   Chronic diastolic CHF (congestive heart failure) (HCC) 12/16/2020   Cellulitis of left arm 12/22/2019   History of non anemic vitamin B12 deficiency 06/27/2018   Aortic atherosclerosis (HCC) 04/06/2018   Allergic rhinitis 05/19/2017   Lung nodule 12/10/2016   Fatty liver 04/22/2016   Neuropathy 02/11/2016   Mitral regurgitation    Vitamin D  deficiency 08/29/2014   Obstructive sleep apnea on CPAP 03/07/2014   Atrial fibrillation with rapid ventricular response (HCC)    HTN (hypertension)    Hypercholesterolemia    RBBB (right bundle branch block)     Orientation RESPIRATION BLADDER Height & Weight     Self  Normal  Incontinent Weight: 70.9 kg Height:  5\' 6"  (167.6 cm)  BEHAVIORAL SYMPTOMS/MOOD NEUROLOGICAL BOWEL NUTRITION STATUS  Wanderer   Incontinent Diet (2g Sodium Restriction Diet)  AMBULATORY STATUS COMMUNICATION OF NEEDS Skin   Independent  (Confused) Normal                       Personal Care Assistance Level of Assistance  Bathing, Dressing Bathing Assistance: Limited assistance   Dressing Assistance: Limited assistance     Functional Limitations Info             SPECIAL CARE FACTORS FREQUENCY  OT (By licensed OT), PT (By licensed PT)     PT Frequency: 5 times per week OT Frequency: 5 times per week            Contractures Contractures Info: Not present    Additional Factors Info  Code Status Code Status Info: DNR Limited             Current Medications (12/03/2023):  This is the current hospital active medication list Current Facility-Administered Medications  Medication Dose Route Frequency Provider Last Rate Last Admin   amiodarone  (PACERONE ) tablet 200 mg  200 mg Oral Daily Niu, Xilin, MD   200 mg at 12/03/23 0843   apixaban  (ELIQUIS ) tablet 5 mg  5 mg Oral BID Niu, Xilin, MD   5 mg at 12/03/23 0843   atorvastatin  (LIPITOR ) tablet 40 mg  40 mg Oral Daily Niu, Xilin, MD   40 mg at 12/03/23 4132   cephALEXin  (KEFLEX ) capsule 500 mg  500 mg Oral Q8H Alexander, Natalie, DO   500 mg at 12/03/23 7829   feeding supplement (ENSURE ENLIVE / ENSURE PLUS) liquid 237 mL  237 mL Oral BID BM Krugh, Marissa C, DO   237 mL at 12/03/23 0844   furosemide  (LASIX ) tablet 20 mg  20 mg Oral Daily Coretta Dexter, RPH   20 mg at 12/03/23 5621   haloperidol  lactate (HALDOL ) injection 2 mg  2 mg Intravenous Q8H PRN Alexander, Natalie, DO       ibuprofen  (ADVIL ) tablet 200 mg  200 mg Oral Q6H PRN Niu, Xilin, MD       LORazepam  (ATIVAN ) injection 1 mg  1 mg Intravenous Q6H PRN Alexander, Natalie, DO       ondansetron  (ZOFRAN ) injection 4 mg  4 mg Intravenous Q8H PRN Niu, Xilin, MD        pantoprazole  (PROTONIX ) EC tablet 40 mg  40 mg Oral Daily Niu, Xilin, MD   40 mg at 12/03/23 3086   polyethylene glycol (MIRALAX  / GLYCOLAX ) packet 17 g  17 g Oral Daily PRN Niu, Xilin, MD   17 g at 12/02/23 2049   QUEtiapine  (SEROQUEL ) tablet 50 mg  50 mg Oral QHS Alexander, Natalie, DO   50 mg at 12/02/23 2049   traZODone  (DESYREL ) tablet 50 mg  50 mg Oral QHS Alexander, Natalie, DO   50 mg at 12/02/23 2049     Discharge Medications: Please see discharge summary for a list of discharge medications.  Relevant Imaging Results:  Relevant Lab Results:   Additional Information    Alexandra Ice, RN

## 2023-12-03 NOTE — Progress Notes (Signed)
 Occupational Therapy Treatment Patient Details Name: Jill Snow MRN: [id number] DOB: 1934-04-26 Today's Date: 12/03/2023   History of present illness Pt admitted for UTI. Pt with complaints of weakness and agitation. History of advanced dementia, HTN, and HLD.   OT comments  Pt seen for OT tx this date. Pt confused, alert, oriented to self, and requires frequent multimodal cues during session to follow 1 step commands. Pt required MOD A for bed mobility, CGA to MOD A to correct for posterior and L lateral lean EOB, MIN-MOD A to stand improving to MIN A with 2nd attempt with VC/TC for hand placement with RW. Pt demo'd difficulty sequencing to take small steps forward, backward, and to the side requiring MIN-MOD A to prevent fall. HR in 110's supine, increasing to low 130's with exertion. MD notified. Pt continues to benefit from skilled OT services.       If plan is discharge home, recommend the following:  A lot of help with walking and/or transfers;A lot of help with bathing/dressing/bathroom;Assistance with cooking/housework;Direct supervision/assist for medications management;Direct supervision/assist for financial management;Assist for transportation;Help with stairs or ramp for entrance;Supervision due to cognitive status   Equipment Recommendations  Other (comment) (defer)    Recommendations for Other Services      Precautions / Restrictions Precautions Precautions: Fall Recall of Precautions/Restrictions: Impaired Restrictions Weight Bearing Restrictions Per Provider Order: No       Mobility Bed Mobility Overal bed mobility: Needs Assistance Bed Mobility: Supine to Sit, Sit to Supine     Supine to sit: Mod assist, HOB elevated Sit to supine: Mod assist        Transfers Overall transfer level: Needs assistance Equipment used: Rolling walker (2 wheels) Transfers: Sit to/from Stand Sit to Stand: Min assist, Mod assist           General transfer comment:  VC and TC for hand placement     Balance Overall balance assessment: Needs assistance Sitting-balance support: Feet supported Sitting balance-Leahy Scale: Poor Sitting balance - Comments: intermittent MIN-mOD A with VC to correct and pt unable to self correct Postural control: Posterior lean, Left lateral lean Standing balance support: Single extremity supported, During functional activity, Reliant on assistive device for balance Standing balance-Leahy Scale: Poor Standing balance comment: CGA-MOD A to maintain balance in standing and when taking a couple lateral steps along the EOB                           ADL either performed or assessed with clinical judgement   ADL Overall ADL's : Needs assistance/impaired     Grooming: Sitting;Standing;Minimal assistance;Cueing for safety;Cueing for sequencing;Wash/dry face Grooming Details (indicate cue type and reason): In sitting and in standing in front of sink mirror, pt set up with comb and required heavy multimodal simple repeated cues to support initiation, sequencing, and continuation ultimately requiring MIN A to complete the task. Similar assist required for washing her face.                                    Extremity/Trunk Assessment              Vision       Haematologist Communication Communication: Impaired Factors Affecting Communication: Difficulty expressing self;Reduced clarity of speech   Cognition Arousal: Alert Behavior During Therapy: [facility] for tasks  assessed/performed Cognition: History of cognitive impairments, No family/caregiver present to determine baseline, Cognition impaired   Orientation impairments: Place, Time, Situation Awareness: Intellectual awareness impaired, Online awareness impaired Memory impairment (select all impairments): Short-term memory, Working [employer] fast streamer, Non-declarative long-term memory, Geneticist, molecular long-term memory Attention  impairment (select first level of impairment): Focused attention Executive functioning impairment (select all impairments): Initiation, Sequencing, Reasoning, Problem solving, Organization                   Following commands: Intact Following commands impaired: Follows one step commands inconsistently      Cueing   Cueing Techniques: Verbal cues, Gestural cues, Tactile cues, Visual cues  Exercises      Shoulder Instructions       General Comments      Pertinent Vitals/ Pain       Pain Assessment Pain Assessment: Faces Faces Pain Scale: No hurt  Home Living                                          Prior Functioning/Environment              Frequency  Min 2X/week        Progress Toward Goals  OT Goals(current goals can now be found in the care plan section)  Progress towards OT goals: OT to reassess next treatment  Acute Rehab OT Goals Patient Stated Goal: feel good OT Goal Formulation: With patient Time For Goal Achievement: 12/15/23 Potential to Achieve Goals: Fair  Plan      Co-evaluation                 AM-PAC OT "6 Clicks" Daily Activity     Outcome Measure   Help from another person eating meals?: A Lot Help from another person taking care of personal grooming?: A Lot Help from another person toileting, which includes using toliet, bedpan, or urinal?: A Lot Help from another person bathing (including washing, rinsing, drying)?: Total Help from another person to put on and taking off regular upper body clothing?: A Lot Help from another person to put on and taking off regular lower body clothing?: A Lot 6 Click Score: 11    End of Session Equipment Utilized During Treatment: Rolling walker (2 wheels)  OT Visit Diagnosis: Unsteadiness on feet (R26.81);Other abnormalities of gait and mobility (R26.89);Repeated falls (R29.6);Muscle weakness (generalized) (M62.81)   Activity Tolerance Patient tolerated treatment  well   Patient Left in bed;with call bell/phone within reach;with bed alarm set;with nursing/sitter in room   Nurse Communication Mobility status        Time: 9528-4132 OT Time Calculation (min): 19 min  Charges: OT General Charges $OT Visit: 1 Visit OT Treatments $Self Care/Home Management : 8-22 mins  Berenda Breaker., MPH, MS, OTR/L ascom [phone] 12/03/23, 11:23 AM

## 2023-12-03 NOTE — Plan of Care (Signed)

## 2023-12-03 NOTE — Progress Notes (Signed)
 PROGRESS NOTE    Jill Snow   ZOX:[id number] DOB: Nov 28, 1933  DOA: 11/29/2023 Date of Service: 12/03/23 which is hospital day 0  PCP: Devorah Fonder, MD    Hospital course / significant events:   HPI: Jill Snow is a 89 y.o. female with medical history significant of advanced dementia, hypertension, hyperlipidemia, diastolic CHF, A-fib on Eliquis , right bundle blockage, who presents from home with weakness. Per son on admission, pt has generalized weakness in the past several days, more confused with agitation.  Her urine has foul smell.   04/14: admitted to hospitalist for UTI 04/15: required antipsychotics overnight for agitation/sundowning.  04/16: UCx multiple species, will continue abx to complete total 5 days. Continued confusion, difficult to redirect, medications adjusted for patient safety and transition to po as able. Needs SNF placement 04/17: no TOC note in chart as of this morning, placement pending  04/18: tachycardic overnight, sinus rhythm. Hx has not been on BB d/t bradycardia, received metoprolol  x1. Still tachycardic, sinus, no complaints. Will w/u for infection - CBC, CMP, UA, CXR. Place on telemetry      Consultants:  none  Procedures/Surgeries: none      ASSESSMENT & PLAN:   Sinus tachycardia Benign exam other than tachycardia Pt has no complaints but is notably confused at baseline Will w/u for infection/sepsis - CBC, CMP, UA, CXR.  Place on telemetry   UTI (urinary tract infection) No fever or leukocytosis. Have ruled out sepsis. UCx multiple species  IV Rocephin  --> po keflex  complete 5 days total    HTN (hypertension) BP at goal for age  Monitor VS   Paroxysmal Atrial fibrillation  Tachycardia overnight 04/17-04/18 Amiodarone  and Eliquis  Hx no beta blocker d/t bradycardia but may consider initiate this   Hypercholesterolemia Lipitor    Chronic diastolic CHF (congestive heart failure) 2D echo on 02/11/2023 showed  EF 60 to 65% with grade 2 diastolic dysfunction. No CHF exacerbation this admission.  cont Lasix  20 mg daily, home dose Monitor BMP   Late onset Alzheimer's dementia with behavioral disturbance Continued confusion, difficult to redirect, medications adjusted for patient safety - she has been wandering into other patient rooms  prn Haldol  for agitation Prn ativan  for severe agitation / sedation  Scheduled at bedtime seroquel  and trazodone  Fall and delerium precaution Transitioned most meds to po or prns to IM, if lose IV access would leave it out     No concerns based on BMI: Body mass index is 24.81 kg/m.  Underweight - under 18  overweight - 25 to 29 obese - 30 or more Class 1 obesity: BMI of 30.0 to 34 Class 2 obesity: BMI of 35.0 to 39 Class 3 obesity: BMI of 40.0 to 49 Super Morbid Obesity: BMI 50-59 Super-super Morbid Obesity: BMI 60+ Significantly low or high BMI is associated with higher medical risk.  Weight management advised as adjunct to other disease management and risk reduction treatments    DVT prophylaxis: Eliquis  IV fluids: no continuous IV fluids  Nutrition: cardiac diet  Central lines / other devices: none  Code Status: DNR ACP documentation reviewed:  none on file in [facility]  TOC needs: SNF rehab placement  Medical barriers to dispo: workup sinus tachycardia, stable possibly tomorrow/day after pending results, dementia may be barrier but she is baseline/stable in this regard             Subjective / Brief ROS:  Patient reports no concern Tachcyardic - asked specifically about chest pain or heart flutters  and she denies Denies SOB She is at baseline confusion but does not appear agitated Reports no concerns w/ urination  Family Communication: will call son this afternoon / pending results     Objective Findings:  Vitals:   12/03/23 0447 12/03/23 0449 12/03/23 0517 12/03/23 0758  BP: 98/64   108/80  Pulse: 97 (!) 110  (!) 111  Resp:  18   16  Temp: (!) 97.4 F (36.3 C)   (!) 96.7 F (35.9 C)  TempSrc: Oral     SpO2: 97% 98%  97%  Weight:   70.9 kg   Height:        Intake/Output Summary (Last 24 hours) at 12/03/2023 1012 Last data filed at 12/02/2023 1923 Gross per 24 hour  Intake 120 ml  Output --  Net 120 ml   Filed Weights   11/29/23 1939 12/01/23 0500 12/03/23 0517  Weight: 71.5 kg 69.7 kg 70.9 kg    Examination:  Physical Exam Constitutional:      General: She is not in acute distress. Cardiovascular:     Rate and Rhythm: Normal rate and regular rhythm.  Pulmonary:     Effort: Pulmonary effort is normal.     Breath sounds: Normal breath sounds.  Abdominal:     Palpations: Abdomen is soft.  Musculoskeletal:     Right lower leg: No edema.     Left lower leg: No edema.  Skin:    General: Skin is warm and dry.  Neurological:     Mental Status: She is alert. Mental status is at baseline. She is disoriented.  Psychiatric:        Mood and Affect: Mood normal.          Scheduled Medications:   amiodarone   200 mg Oral Daily   apixaban   5 mg Oral BID   atorvastatin   40 mg Oral Daily   cephALEXin   500 mg Oral Q8H   feeding supplement  237 mL Oral BID BM   furosemide   20 mg Oral Daily   pantoprazole   40 mg Oral Daily   QUEtiapine   50 mg Oral QHS   traZODone   50 mg Oral QHS    Continuous Infusions:    PRN Medications:  haloperidol  lactate, ibuprofen , LORazepam , ondansetron  (ZOFRAN ) IV, polyethylene glycol  Antimicrobials from admission:  Anti-infectives (From admission, onward)    Start     Dose/Rate Route Frequency Ordered Stop   12/02/23 0600  cephALEXin  (KEFLEX ) capsule 500 mg        500 mg Oral Every 8 hours 12/01/23 1730     11/30/23 0015  cefTRIAXone  (ROCEPHIN ) 1 g in sodium chloride  0.9 % 100 mL IVPB  Status:  Discontinued        1 g 200 mL/hr over 30 Minutes Intravenous Every 24 hours 11/30/23 0008 12/01/23 1730           Data Reviewed:  I have personally  reviewed the following...  CBC: Recent Labs  Lab 11/29/23 2000 11/30/23 0549 12/01/23 0347  WBC 7.5 5.9 4.9  NEUTROABS 6.4  --   --   HGB 12.4 11.9* 12.8  HCT 37.7 36.8 38.7  MCV 99.0 99.7 98.2  PLT 214 192 175   Basic Metabolic Panel: Recent Labs  Lab 11/29/23 2000 11/30/23 0549 12/01/23 0347 12/02/23 2140  NA 139 140 142  --   K 4.4 3.4* 4.1  --   CL 106 109 110  --   CO2 25 24 25   --  GLUCOSE 100* 86 69*  --   BUN 19 17 20   --   CREATININE 0.80 0.81 0.92  --   CALCIUM  9.3 8.9 9.2  --   MG  --   --   --  2.2   GFR: Estimated Creatinine Clearance: 39.6 mL/min (by C-G formula based on SCr of 0.92 mg/dL). Liver Function Tests: Recent Labs  Lab 11/29/23 2000 12/01/23 0347  AST 61* 43*  ALT 35 32  ALKPHOS 68 62  BILITOT 1.5* 1.2  PROT 7.0 6.5  ALBUMIN 3.7 3.3*   No results for input(s): "LIPASE", "AMYLASE" in the last 168 hours. No results for input(s): "AMMONIA" in the last 168 hours. Coagulation Profile: No results for input(s): "INR", "PROTIME" in the last 168 hours. Cardiac Enzymes: No results for input(s): "CKTOTAL", "CKMB", "CKMBINDEX", "TROPONINI" in the last 168 hours. BNP (last 3 results) No results for input(s): "PROBNP" in the last 8760 hours. HbA1C: No results for input(s): "HGBA1C" in the last 72 hours. CBG: No results for input(s): "GLUCAP" in the last 168 hours. Lipid Profile: No results for input(s): "CHOL", "HDL", "LDLCALC", "TRIG", "CHOLHDL", "LDLDIRECT" in the last 72 hours. Thyroid  Function Tests: No results for input(s): "TSH", "T4TOTAL", "FREET4", "T3FREE", "THYROIDAB" in the last 72 hours. Anemia Panel: No results for input(s): "VITAMINB12", "FOLATE", "FERRITIN", "TIBC", "IRON", "RETICCTPCT" in the last 72 hours. Most Recent Urinalysis On File:     Component Value Date/Time   COLORURINE AMBER (A) 11/29/2023 2230   APPEARANCEUR CLOUDY (A) 11/29/2023 2230   APPEARANCEUR Hazy 05/05/2012 0014   LABSPEC 1.011 11/29/2023 2230    LABSPEC 1.017 05/05/2012 0014   PHURINE 7.0 11/29/2023 2230   GLUCOSEU NEGATIVE 11/29/2023 2230   GLUCOSEU NEGATIVE 06/04/2021 1134   HGBUR NEGATIVE 11/29/2023 2230   BILIRUBINUR NEGATIVE 11/29/2023 2230   BILIRUBINUR neg 06/09/2021 1621   BILIRUBINUR Negative 05/05/2012 0014   KETONESUR NEGATIVE 11/29/2023 2230   PROTEINUR NEGATIVE 11/29/2023 2230   UROBILINOGEN 0.2 06/09/2021 1621   UROBILINOGEN 0.2 06/04/2021 1134   NITRITE NEGATIVE 11/29/2023 2230   LEUKOCYTESUR LARGE (A) 11/29/2023 2230   LEUKOCYTESUR 3+ 05/05/2012 0014   Sepsis Labs: @LABRCNTIP (procalcitonin:4,lacticidven:4) Microbiology: Recent Results (from the past 240 hours)  Culture, blood (routine x 2)     Status: None (Preliminary result)   Collection Time: 11/29/23  8:30 PM   Specimen: BLOOD  Result Value Ref Range Status   Specimen Description BLOOD BLOOD LEFT FOREARM  Final   Special Requests   Final    BOTTLES DRAWN AEROBIC AND ANAEROBIC Blood Culture adequate volume   Culture   Final    NO GROWTH 4 DAYS Performed at [facility], [address], [place], [place] [address]    Report Status PENDING  Incomplete  Culture, blood (routine x 2)     Status: None (Preliminary result)   Collection Time: 11/29/23  8:51 PM   Specimen: BLOOD  Result Value Ref Range Status   Specimen Description BLOOD BLOOD LEFT HAND  Final   Special Requests   Final    BOTTLES DRAWN AEROBIC AND ANAEROBIC Blood Culture results may not be optimal due to an inadequate volume of blood received in culture bottles   Culture   Final    NO GROWTH 4 DAYS Performed at [facility], [address] [place], [place] [address]    Report Status PENDING  Incomplete  Urine Culture     Status: Abnormal   Collection Time: 11/29/23 10:30 PM   Specimen: Urine, Random  Result Value Ref Range Status   Specimen Description   Final    URINE, RANDOM Performed at [facility] [address] [place], [place] [address]     Special Requests   Final    NONE Reflexed from [phone] Performed at [facility], [address] [place], [place] [address]    Culture MULTIPLE SPECIES PRESENT, SUGGEST RECOLLECTION (A)  Final   Report Status 12/01/2023 FINAL  Final      Radiology Studies last 3 days: CT Head Wo Contrast Result Date: 11/29/2023 CLINICAL DATA:  Altered mental status EXAM: CT HEAD WITHOUT CONTRAST TECHNIQUE: Contiguous axial images were obtained from the base of the skull through the vertex without intravenous contrast. RADIATION DOSE REDUCTION: This exam was performed according to the departmental dose-optimization program which includes automated exposure control, adjustment of the mA and/or kV according to patient size and/or use of iterative reconstruction technique. COMPARISON:  02/10/2023 FINDINGS: Brain: No evidence of acute infarction, hemorrhage, hydrocephalus, extra-axial collection or mass lesion/mass effect. Mild atrophic and chronic white matter ischemic changes are noted. Vascular: No hyperdense vessel or unexpected calcification. Skull: Normal. Negative for fracture or focal lesion. Sinuses/Orbits: No acute finding. Other: None. IMPRESSION: Chronic atrophic and ischemic changes. Electronically Signed   By: Violeta Grey M.D.   On: 11/29/2023 22:08   DG Chest 1 View Result Date: 11/29/2023 CLINICAL DATA:  Altered mental status EXAM: CHEST  1 VIEW COMPARISON:  Chest x-ray 02/10/2023 FINDINGS: Heart is enlarged. There is a small left pleural effusion and minimal left basilar patchy opacities. Right lung is clear. No pneumothorax or acute fracture. IMPRESSION: 1. Small left pleural effusion and minimal left basilar patchy opacities, likely atelectasis. 2. Cardiomegaly. Electronically Signed   By: Tyron Gallon M.D.   On: 11/29/2023 21:49        Kyliah Deanda, DO [facility] 12/03/2023, 10:12 AM    Dictation software may have been used to generate the above note. Typos may occur  and escape review in typed/dictated notes. Please contact Dr Authur Leghorn directly for clarity if needed.  Staff may message me via secure chat in Epic  but this may not receive an immediate response,  please page me for urgent matters!  If 7PM-7AM, please contact night coverage [email or web address]

## 2023-12-03 NOTE — Progress Notes (Signed)
 Saw pt again this afternoon approx 16:00  S: Pt resting, she alerts to voice but is mumbling, she denies pain. Sitter states pt has had no complaints  O: BP 95/68   Pulse (!) 108   Temp (!) 96.7 F (35.9 C)   Resp 16   Ht 5\' 6"  (1.676 m)   Wt 70.9 kg   SpO2 98%   BMI 25.23 kg/m  Resting/drowsy but alerts to voice, NAD Tachycardic reg rhythm Lung sounds clear in all fields including LLL which was area of some concern on CXR  Nothing on labs to indicate infection, renal fxn WNL  A/P: Sinus tachycardia Still awaiting UA, if this is substantially abnormal would escalate abx in case inadequate treatment (given UCx inconclusive / multiple species) Doubt pneumonia based on exam, no O2 requirement, no cough No diarrhea/GI symptoms Consider Flu/COVID testing if fever or respiratory symtpoms develop Giving liter fluids and keep on tele  Called and spoke w/ son 4:25 PM all questions answered  Time spent 25 min

## 2023-12-03 NOTE — Progress Notes (Signed)
 MEWS Progress Note  Patient Details Name: Jill Snow MRN: [id number] DOB: [birth year] Today's Date: 12/03/2023   MEWS Flowsheet Documentation:  Assess: MEWS Score Temp: (!) 97.3 F (36.3 C) BP: 107/60 MAP (mmHg): 76 Pulse Rate: (!) 145 ECG Heart Rate: (!) 55 Resp: 18 Level of Consciousness: Alert SpO2: 99 % O2 Device: Room Air Patient Activity (if Appropriate): In bed Assess: MEWS Score MEWS Temp: 0 MEWS Systolic: 0 MEWS Pulse: 3 MEWS RR: 0 MEWS LOC: 0 MEWS Score: 3 MEWS Score Color: Yellow Assess: SIRS CRITERIA SIRS Temperature : 0 SIRS Respirations : 0 SIRS Pulse: 1 SIRS WBC: 0 SIRS Score Sum : 1 SIRS Temperature : 0 SIRS Pulse: 1 SIRS Respirations : 0 SIRS WBC: 0 SIRS Score Sum : 1 Assess: if the MEWS score is Yellow or Red Were vital signs accurate and taken at a resting state?: Yes Does the patient meet 2 or more of the SIRS criteria?: No MEWS guidelines implemented : Yes, yellow Treat MEWS Interventions: Considered administering scheduled or prn medications/treatments as ordered Take Vital Signs Increase Vital Sign Frequency : Yellow: Q2hr x1, continue Q4hrs until patient remains green for 12hrs Escalate MEWS: Escalate: Yellow: Discuss with charge nurse and consider notifying provider and/or RRT        Ysidro Her 12/03/2023, 1:31 AM

## 2023-12-03 NOTE — TOC Progression Note (Signed)
 Transition of Care [facility]) - Progression Note    Patient Details  Name: Jill Snow MRN: [id number] Date of Birth: Jun 08, 1934  [facility] CM/SW Contact  Alexandra Ice, RN Phone Number: 12/03/2023, 3:54 PM  Clinical Narrative:     [facility] accepted patient and son agreeable. Tor[facility] Freeze for SNF. Updated MD on status of placement.        Expected Discharge Plan and Services                                               Social Determinants of Health (SDOH) Interventions SDOH Screenings   Food Insecurity: No Food Insecurity (11/30/2023)  Housing: Low Risk  (11/30/2023)  Transportation Needs: No Transportation Needs (11/30/2023)  Utilities: Not At Risk (11/30/2023)  Depression (PHQ2-9): Low Risk  (11/10/2022)  Financial Resource Strain: Low Risk  (06/02/2021)  Physical Activity: Insufficiently Active (06/02/2021)  Social Connections: Socially Isolated (11/30/2023)  Stress: No Stress Concern Present (06/02/2021)  Tobacco Use: Low Risk  (11/30/2023)    Readmission Risk Interventions     No data to display

## 2023-12-04 DIAGNOSIS — N3000 Acute cystitis without hematuria: Secondary | ICD-10-CM | POA: Diagnosis not present

## 2023-12-04 LAB — URINALYSIS, COMPLETE (UACMP) WITH MICROSCOPIC
Bacteria, UA: NONE SEEN
Bilirubin Urine: NEGATIVE
Glucose, UA: NEGATIVE mg/dL
Ketones, ur: NEGATIVE mg/dL
Leukocytes,Ua: NEGATIVE
Nitrite: NEGATIVE
Protein, ur: NEGATIVE mg/dL
Specific Gravity, Urine: 1.006 (ref 1.005–1.030)
WBC, UA: 0 WBC/hpf (ref 0–5)
pH: 7 (ref 5.0–8.0)

## 2023-12-04 LAB — CULTURE, BLOOD (ROUTINE X 2)
Culture: NO GROWTH
Culture: NO GROWTH
Special Requests: ADEQUATE

## 2023-12-04 LAB — BASIC METABOLIC PANEL WITH GFR
Anion gap: 8 (ref 5–15)
BUN: 17 mg/dL (ref 8–23)
CO2: 25 mmol/L (ref 22–32)
Calcium: 8.9 mg/dL (ref 8.9–10.3)
Chloride: 105 mmol/L (ref 98–111)
Creatinine, Ser: 0.8 mg/dL (ref 0.44–1.00)
GFR, Estimated: >60 mL/min (ref >60)
Glucose, Bld: 106 mg/dL — ABNORMAL HIGH (ref 70–99)
Potassium: 3.6 mmol/L (ref 3.5–5.1)
Sodium: 138 mmol/L (ref 135–145)

## 2023-12-04 LAB — CBC
HCT: 39.2 % (ref 36.0–46.0)
Hemoglobin: 13.3 g/dL (ref 12.0–15.0)
MCH: 32.4 pg (ref 26.0–34.0)
MCHC: 33.9 g/dL (ref 30.0–36.0)
MCV: 95.6 fL (ref 80.0–100.0)
Platelets: 213 10*3/uL (ref 150–400)
RBC: 4.1 MIL/uL (ref 3.87–5.11)
RDW: 13.1 % (ref 11.5–15.5)
WBC: 7.6 10*3/uL (ref 4.0–10.5)
nRBC: 0 % (ref 0.0–0.2)

## 2023-12-04 NOTE — Progress Notes (Signed)
 PROGRESS NOTE    Jill Snow   WUJ:[id number] DOB: 1933-10-01  DOA: 11/29/2023 Date of Service: 12/04/23 which is hospital day 1  PCP: Devorah Fonder, MD    Hospital course / significant events:   HPI: Jill Snow is a 89 y.o. female with medical history significant of advanced dementia, hypertension, hyperlipidemia, diastolic CHF, A-fib on Eliquis , right bundle blockage, who presents from home with weakness. Per son on admission, pt has generalized weakness in the past several days, more confused with agitation.  Her urine has foul smell.   04/14: admitted to hospitalist for UTI 04/15: required antipsychotics overnight for agitation/sundowning.  04/16: UCx multiple species, will continue abx to complete total 5 days. Continued confusion, difficult to redirect, medications adjusted for patient safety and transition to po as able. Needs SNF placement 04/17: no TOC note in chart as of this morning, placement pending  04/18: tachycardic overnight, sinus rhythm. Hx has not been on BB d/t bradycardia, received metoprolol  x1. Still tachycardic, sinus, no complaints. Will w/u for infection - CBC, CMP, UA, CXR. Place on telemetry. Infectious w/u non-revealing, gave fluids, HR improved  04/19: more sleepy this morning, rousing briefly to pain stimuli but not verbalizing, muscle tone is strong all extremities she is pulling away. Per RN was up multiple times overnight. Will check frequently, no focal deficits.      Consultants:  none  Procedures/Surgeries: none      ASSESSMENT & PLAN:   Sinus tachycardia - improved/resolved  Benign exam other than tachycardia Pt has no complaints but is notably confused at baseline Will w/u for infection/sepsis - CBC, CMP, UA, CXR --> neg Improved on 1L fluids   telemetry  Monitor   Late onset Alzheimer's dementia with behavioral disturbance Continued confusion, difficult to redirect, medications adjusted for patient safety - she  has been wandering into other patient rooms  prn Haldol  for agitation Prn ativan  for severe agitation / sedation  Scheduled at bedtime seroquel  and trazodone  --> will reduce these given somnolence this morning  Fall and delerium precaution Transitioned most meds to po or prns to IM, if lose IV access would leave it out   UTI (urinary tract infection) No fever or leukocytosis. Have ruled out sepsis. UCx multiple species  IV Rocephin  --> po keflex  complete 5 days total    HTN (hypertension) BP at goal for age  Monitor VS   Paroxysmal Atrial fibrillation  Tachycardia overnight 04/17-04/18 Amiodarone  and Eliquis  Hx no beta blocker d/t bradycardia but may consider initiate this   Hypercholesterolemia Lipitor    Chronic diastolic CHF (congestive heart failure) 2D echo on 02/11/2023 showed EF 60 to 65% with grade 2 diastolic dysfunction. No CHF exacerbation this admission.  cont Lasix  20 mg daily, home dose Monitor BMP       No concerns based on BMI: Body mass index is 24.81 kg/m.  Underweight - under 18  overweight - 25 to 29 obese - 30 or more Class 1 obesity: BMI of 30.0 to 34 Class 2 obesity: BMI of 35.0 to 39 Class 3 obesity: BMI of 40.0 to 49 Super Morbid Obesity: BMI 50-59 Super-super Morbid Obesity: BMI 60+ Significantly low or high BMI is associated with higher medical risk.  Weight management advised as adjunct to other disease management and risk reduction treatments    DVT prophylaxis: Eliquis  IV fluids: no continuous IV fluids  Nutrition: cardiac diet  Central lines / other devices: none  Code Status: DNR ACP documentation reviewed:  none on  file in [facility]  [facility] needs: SNF rehab placement  Medical barriers to dispo: somnolence, expect dementia may be barrier to rehab, may need LTC             Subjective / Brief ROS:  Patient not voicing anything, no concerns and no answers to questions  Per RN more sleepy today  Family Communication: will  call son this afternoon, will recheck pt this afternoon see if more alert, RN is instructed to let me know if son visits     Objective Findings:  Vitals:   12/03/23 2249 12/04/23 0410 12/04/23 0608 12/04/23 0802  BP: 117/85 104/76  107/64  Pulse: (!) 119 (!) 52  90  Resp: 16 18  16   Temp: 97.6 F (36.4 C) (!) 97.3 F (36.3 C)  (!) 97.4 F (36.3 C)  TempSrc:  Oral  Oral  SpO2: 98% 95%  93%  Weight:   69.9 kg   Height:        Intake/Output Summary (Last 24 hours) at 12/04/2023 1128 Last data filed at 12/04/2023 0400 Gross per 24 hour  Intake 190.02 ml  Output 1050 ml  Net -859.98 ml   Filed Weights   12/01/23 0500 12/03/23 0517 12/04/23 0608  Weight: 69.7 kg 70.9 kg 69.9 kg    Examination:  Physical Exam Constitutional:      General: She is not in acute distress.    Appearance: She is not toxic-appearing.  Cardiovascular:     Rate and Rhythm: Normal rate and regular rhythm.  Pulmonary:     Effort: Pulmonary effort is normal.     Breath sounds: Normal breath sounds.  Abdominal:     Palpations: Abdomen is soft.  Musculoskeletal:     Right lower leg: No edema.     Left lower leg: No edema.     Comments: Moving extremities - pulling away from attempts to examine   Skin:    General: Skin is warm and dry.  Neurological:     Mental Status: She is disoriented.          Scheduled Medications:   amiodarone   200 mg Oral Daily   apixaban   5 mg Oral BID   atorvastatin   40 mg Oral Daily   cephALEXin   500 mg Oral Q8H   feeding supplement  237 mL Oral BID BM   pantoprazole   40 mg Oral Daily   QUEtiapine   50 mg Oral QHS   traZODone   50 mg Oral QHS    Continuous Infusions:    PRN Medications:  haloperidol  lactate, ibuprofen , LORazepam , ondansetron  (ZOFRAN ) IV, polyethylene glycol  Antimicrobials from admission:  Anti-infectives (From admission, onward)    Start     Dose/Rate Route Frequency Ordered Stop   12/02/23 0600  cephALEXin  (KEFLEX ) capsule 500 mg         500 mg Oral Every 8 hours 12/01/23 1730 12/04/23 2359   11/30/23 0015  cefTRIAXone  (ROCEPHIN ) 1 g in sodium chloride  0.9 % 100 mL IVPB  Status:  Discontinued        1 g 200 mL/hr over 30 Minutes Intravenous Every 24 hours 11/30/23 0008 12/01/23 1730           Data Reviewed:  I have personally reviewed the following...  CBC: Recent Labs  Lab 11/29/23 2000 11/30/23 0549 12/01/23 0347 12/03/23 1024 12/04/23 0759  WBC 7.5 5.9 4.9 6.6 7.6  NEUTROABS 6.4  --   --  5.4  --   HGB 12.4 11.9* 12.8 12.6  13.3  HCT 37.7 36.8 38.7 38.4 39.2  MCV 99.0 99.7 98.2 99.7 95.6  PLT 214 192 175 216 213   Basic Metabolic Panel: Recent Labs  Lab 11/29/23 2000 11/30/23 0549 12/01/23 0347 12/02/23 2140 12/03/23 1024 12/04/23 0759  NA 139 140 142  --  140 138  K 4.4 3.4* 4.1  --  4.2 3.6  CL 106 109 110  --  106 105  CO2 25 24 25   --  26 25  GLUCOSE 100* 86 69*  --  128* 106*  BUN 19 17 20   --  19 17  CREATININE 0.80 0.81 0.92  --  0.82 0.80  CALCIUM  9.3 8.9 9.2  --  9.1 8.9  MG  --   --   --  2.2  --   --    GFR: Estimated Creatinine Clearance: 45.5 mL/min (by C-G formula based on SCr of 0.8 mg/dL). Liver Function Tests: Recent Labs  Lab 11/29/23 2000 12/01/23 0347 12/03/23 1024  AST 61* 43* 25  ALT 35 32 28  ALKPHOS 68 62 61  BILITOT 1.5* 1.2 1.1  PROT 7.0 6.5 6.4*  ALBUMIN 3.7 3.3* 3.1*   No results for input(s): "LIPASE", "AMYLASE" in the last 168 hours. No results for input(s): "AMMONIA" in the last 168 hours. Coagulation Profile: No results for input(s): "INR", "PROTIME" in the last 168 hours. Cardiac Enzymes: No results for input(s): "CKTOTAL", "CKMB", "CKMBINDEX", "TROPONINI" in the last 168 hours. BNP (last 3 results) No results for input(s): "PROBNP" in the last 8760 hours. HbA1C: No results for input(s): "HGBA1C" in the last 72 hours. CBG: No results for input(s): "GLUCAP" in the last 168 hours. Lipid Profile: No results for input(s): "CHOL", "HDL",  "LDLCALC", "TRIG", "CHOLHDL", "LDLDIRECT" in the last 72 hours. Thyroid  Function Tests: No results for input(s): "TSH", "T4TOTAL", "FREET4", "T3FREE", "THYROIDAB" in the last 72 hours. Anemia Panel: No results for input(s): "VITAMINB12", "FOLATE", "FERRITIN", "TIBC", "IRON", "RETICCTPCT" in the last 72 hours. Most Recent Urinalysis On File:     Component Value Date/Time   COLORURINE STRAW (A) 12/04/2023 0525   APPEARANCEUR CLEAR (A) 12/04/2023 0525   APPEARANCEUR Hazy 05/05/2012 0014   LABSPEC 1.006 12/04/2023 0525   LABSPEC 1.017 05/05/2012 0014   PHURINE 7.0 12/04/2023 0525   GLUCOSEU NEGATIVE 12/04/2023 0525   GLUCOSEU NEGATIVE 06/04/2021 1134   HGBUR SMALL (A) 12/04/2023 0525   BILIRUBINUR NEGATIVE 12/04/2023 0525   BILIRUBINUR neg 06/09/2021 1621   BILIRUBINUR Negative 05/05/2012 0014   KETONESUR NEGATIVE 12/04/2023 0525   PROTEINUR NEGATIVE 12/04/2023 0525   UROBILINOGEN 0.2 06/09/2021 1621   UROBILINOGEN 0.2 06/04/2021 1134   NITRITE NEGATIVE 12/04/2023 0525   LEUKOCYTESUR NEGATIVE 12/04/2023 0525   LEUKOCYTESUR 3+ 05/05/2012 0014   Sepsis Labs: @LABRCNTIP (procalcitonin:4,lacticidven:4) Microbiology: Recent Results (from the past 240 hours)  Culture, blood (routine x 2)     Status: None   Collection Time: 11/29/23  8:30 PM   Specimen: BLOOD  Result Value Ref Range Status   Specimen Description BLOOD BLOOD LEFT FOREARM  Final   Special Requests   Final    BOTTLES DRAWN AEROBIC AND ANAEROBIC Blood Culture adequate volume   Culture   Final    NO GROWTH 5 DAYS Performed at [facility], [address] [place], [place] [address]    Report Status 12/04/2023 FINAL  Final  Culture, blood (routine x 2)     Status: None   Collection Time: 11/29/23  8:51 PM   Specimen: BLOOD  Result Value Ref Range Status   Specimen Description BLOOD BLOOD LEFT HAND  Final   Special Requests   Final    BOTTLES DRAWN AEROBIC AND ANAEROBIC Blood Culture results may not be  optimal due to an inadequate volume of blood received in culture bottles   Culture   Final    NO GROWTH 5 DAYS Performed at [facility], [address] [place], [place] [address]    Report Status 12/04/2023 FINAL  Final  Urine Culture     Status: Abnormal   Collection Time: 11/29/23 10:30 PM   Specimen: Urine, Random  Result Value Ref Range Status   Specimen Description   Final    URINE, RANDOM Performed at [facility], [address], [place], [place] [address]    Special Requests   Final    NONE Reflexed from [id number] Performed at [facility], [address] [place], [place] [address]    Culture MULTIPLE SPECIES PRESENT, SUGGEST RECOLLECTION (A)  Final   Report Status 12/01/2023 FINAL  Final      Radiology Studies last 3 days: DG Chest Port 1 View Result Date: 12/03/2023 CLINICAL DATA:  Tachycardia EXAM: PORTABLE CHEST 1 VIEW COMPARISON:  Chest radiograph dated 11/29/2023 FINDINGS: Low lung volumes with bronchovascular crowding. Again seen is large hiatal hernia within the left lower thorax. Increased dense left retrocardiac opacity. No pleural effusion or pneumothorax. Similar cardiomediastinal silhouette. No acute osseous abnormality. IMPRESSION: 1. Increased dense left retrocardiac opacity, likely atelectasis. Aspiration or pneumonia can be considered in the appropriate clinical setting. 2. Large hiatal hernia. Electronically Signed   By: Limin  Xu M.D.   On: 12/03/2023 14:28        Melodi Sprung, DO [facility] 12/04/2023, 11:28 AM    Dictation software may have been used to generate the above note. Typos may occur and escape review in typed/dictated notes. Please contact Dr Authur Leghorn directly for clarity if needed.  Staff may message me via secure chat in Epic  but this may not receive an immediate response,  please page me for urgent matters!  If 7PM-7AM, please contact night coverage [email or web address]

## 2023-12-05 DIAGNOSIS — I4891 Unspecified atrial fibrillation: Secondary | ICD-10-CM | POA: Diagnosis not present

## 2023-12-05 LAB — CBC
HCT: 41.2 % (ref 36.0–46.0)
Hemoglobin: 13.9 g/dL (ref 12.0–15.0)
MCH: 32.4 pg (ref 26.0–34.0)
MCHC: 33.7 g/dL (ref 30.0–36.0)
MCV: 96 fL (ref 80.0–100.0)
Platelets: 218 10*3/uL (ref 150–400)
RBC: 4.29 MIL/uL (ref 3.87–5.11)
RDW: 13.1 % (ref 11.5–15.5)
WBC: 7 10*3/uL (ref 4.0–10.5)
nRBC: 0 % (ref 0.0–0.2)

## 2023-12-05 LAB — BASIC METABOLIC PANEL WITH GFR
Anion gap: 9 (ref 5–15)
BUN: 18 mg/dL (ref 8–23)
CO2: 26 mmol/L (ref 22–32)
Calcium: 9.3 mg/dL (ref 8.9–10.3)
Chloride: 105 mmol/L (ref 98–111)
Creatinine, Ser: 0.83 mg/dL (ref 0.44–1.00)
GFR, Estimated: >60 mL/min (ref >60)
Glucose, Bld: 96 mg/dL (ref 70–99)
Potassium: 3.9 mmol/L (ref 3.5–5.1)
Sodium: 140 mmol/L (ref 135–145)

## 2023-12-05 LAB — TSH: TSH: 2.002 u[IU]/mL (ref 0.350–4.500)

## 2023-12-05 MED ORDER — DILTIAZEM HCL 25 MG/5ML IV SOLN
10.0000 mg | Freq: Once | INTRAVENOUS | Status: AC
  Administered 2023-12-05: 10 mg via INTRAVENOUS
  Filled 2023-12-05: qty 5

## 2023-12-05 MED ORDER — BISACODYL 10 MG RE SUPP: 10.0000 mg | Freq: Every day | RECTAL | Status: DC | PRN

## 2023-12-05 MED ORDER — DILTIAZEM HCL 25 MG/5ML IV SOLN
10.0000 mg | Freq: Once | INTRAVENOUS | Status: AC | PRN
  Administered 2023-12-05: 10 mg via INTRAVENOUS
  Filled 2023-12-05: qty 5

## 2023-12-05 MED ORDER — SENNOSIDES-DOCUSATE SODIUM 8.6-50 MG PO TABS
2.0000 | ORAL_TABLET | Freq: Once | ORAL | Status: AC
  Administered 2023-12-05: 2 via ORAL
  Filled 2023-12-05: qty 2

## 2023-12-05 MED ORDER — DILTIAZEM HCL-DEXTROSE 125-5 MG/125ML-% IV SOLN (PREMIX)
5.0000 mg/h | INTRAVENOUS | Status: DC
  Administered 2023-12-05: 10 mg/h via INTRAVENOUS
  Administered 2023-12-05: 5 mg/h via INTRAVENOUS
  Administered 2023-12-06 (×2): 10 mg/h via INTRAVENOUS
  Administered 2023-12-06: 7.5 mg/h via INTRAVENOUS
  Filled 2023-12-05 (×3): qty 125

## 2023-12-05 MED ORDER — DILTIAZEM HCL 30 MG PO TABS
30.0000 mg | ORAL_TABLET | Freq: Four times a day (QID) | ORAL | Status: DC
  Administered 2023-12-05: 30 mg via ORAL
  Filled 2023-12-05 (×2): qty 1

## 2023-12-05 MED ORDER — DILTIAZEM LOAD VIA INFUSION
15.0000 mg | Freq: Once | INTRAVENOUS | Status: AC
  Administered 2023-12-05: 15 mg via INTRAVENOUS
  Filled 2023-12-05: qty 15

## 2023-12-05 MED ORDER — DILTIAZEM HCL 60 MG PO TABS
30.0000 mg | ORAL_TABLET | Freq: Four times a day (QID) | ORAL | Status: DC
  Administered 2023-12-06 (×4): 30 mg via ORAL
  Filled 2023-12-05: qty 1
  Filled 2023-12-05: qty 0.5
  Filled 2023-12-05: qty 1
  Filled 2023-12-05: qty 0.5
  Filled 2023-12-05 (×4): qty 1
  Filled 2023-12-05: qty 0.5
  Filled 2023-12-05 (×2): qty 1
  Filled 2023-12-05: qty 0.5

## 2023-12-05 MED ORDER — AMIODARONE HCL 200 MG PO TABS
200.0000 mg | ORAL_TABLET | Freq: Two times a day (BID) | ORAL | Status: DC
  Administered 2023-12-05 – 2023-12-10 (×10): 200 mg via ORAL
  Filled 2023-12-05 (×10): qty 1

## 2023-12-05 NOTE — Progress Notes (Signed)
 PROGRESS NOTE    Jill Snow   UJW:[id number] DOB: 01-26-1934  DOA: 11/29/2023 Date of Service: 12/05/23 which is hospital day 2  PCP: Devorah Fonder, MD    Hospital course / significant events:   HPI: Jill Snow is a 89 y.o. female with medical history significant of advanced dementia, hypertension, hyperlipidemia, diastolic CHF, A-fib on Eliquis , right bundle blockage, who presents from home with weakness. Per son on admission, pt has generalized weakness in the past several days, more confused with agitation.  Her urine has foul smell.   04/14: admitted to hospitalist for UTI 04/15: required antipsychotics overnight for agitation/sundowning.  04/16: UCx multiple species, will continue abx to complete total 5 days. Continued confusion, difficult to redirect, medications adjusted for patient safety and transition to po as able. Needs SNF placement 04/17: no TOC note in chart as of this morning, placement pending  04/18: tachycardic overnight, sinus rhythm. Hx has not been on BB d/t bradycardia, received metoprolol  x1. Still tachycardic, sinus, no complaints. Will w/u for infection - CBC, CMP, UA, CXR. Place on telemetry. Infectious w/u non-revealing, gave fluids, HR improved  04/19: more sleepy this morning, rousing briefly to pain stimuli but not verbalizing, muscle tone is strong all extremities she is pulling away. Per RN was up multiple times overnight. Will check frequently, no focal deficits.  04/20: more alert today. EKG confirms Afib briefly sustaining into 130's but responded well to cardizem  IV w/ addition of po, however back into RVR and needing to start cardizem  gtt.      Consultants:  none  Procedures/Surgeries: none      ASSESSMENT & PLAN:   UTI (urinary tract infection) No fever or leukocytosis. Have ruled out sepsis. UCx multiple species  IV Rocephin  --> po keflex  complete 5 days total     Paroxysmal Atrial fibrillation  Tachycardia  overnight 04/17-04/18 Amiodarone  200 mg daily --> increased today to 200 bid  Eliquis  Hx no beta blocker d/t bradycardia but may consider initiate this ROR today --> initial reduced rate w/ cardizem  IV and started po, however back into RVR not responding to IV push so will start drip and transfer to progressive unit continue po cardizem  titrate to effect wean drip as able Low threshold for cardiology consult   Late onset Alzheimer's dementia with behavioral disturbance Continued confusion, difficult to redirect, medications adjusted for patient safety - she has been wandering into other patient rooms  prn Haldol  for agitation Prn ativan  for severe agitation / sedation  Scheduled at bedtime seroquel  and trazodone  --> will reduce these given somnolence this morning  Fall and delerium precaution Transitioned most meds to po or prns to IM, if lose IV access would leave it out   HTN (hypertension) BP at goal for age  Monitor VS   Hypercholesterolemia Lipitor    Chronic diastolic CHF (congestive heart failure) 2D echo on 02/11/2023 showed EF 60 to 65% with grade 2 diastolic dysfunction. No CHF exacerbation this admission.  cont Lasix  20 mg daily, home dose Monitor BMP  No concerns based on BMI: Body mass index is 24.81 kg/m.  Underweight - under 18  overweight - 25 to 29 obese - 30 or more Class 1 obesity: BMI of 30.0 to 34 Class 2 obesity: BMI of 35.0 to 39 Class 3 obesity: BMI of 40.0 to 49 Super Morbid Obesity: BMI 50-59 Super-super Morbid Obesity: BMI 60+ Significantly low or high BMI is associated with higher medical risk.  Weight management advised as adjunct to  other disease management and risk reduction treatments    DVT prophylaxis: Eliquis  IV fluids: no continuous IV fluids  Nutrition: cardiac diet  Central lines / other devices: none  Code Status: DNR ACP documentation reviewed:  none on file in [facility]  [facility] needs: SNF rehab placement vs sone states likely he will  take her home when ready may need HH/DME  Medical barriers to dispo: Afib RVR, expect dementia may be barrier to rehab, may need LTC vs home w/ family             Subjective / Brief ROS:  Patient much more alert today no concers Denies chest pain or trouble breathing  Baseline confusion  Per RN more sleepy today  Family Communication: son at bedside spoke w/ him 12/05/23 4:07 PM     Objective Findings:  Vitals:   12/04/23 1206 12/04/23 1940 12/05/23 0356 12/05/23 0833  BP: (!) 112/42 132/79 126/70 116/60  Pulse: (!) 59 (!) 110 95 72  Resp: 16 20 20    Temp: (!) 97.5 F (36.4 C) (!) 97.4 F (36.3 C) (!) 97.3 F (36.3 C)   TempSrc: Oral Oral Oral   SpO2: 98% 97% 100% 98%  Weight:      Height:        Intake/Output Summary (Last 24 hours) at 12/05/2023 1607 Last data filed at 12/05/2023 1318 Gross per 24 hour  Intake 240 ml  Output 900 ml  Net -660 ml   Filed Weights   12/01/23 0500 12/03/23 0517 12/04/23 0608  Weight: 69.7 kg 70.9 kg 69.9 kg    Examination:  Physical Exam Constitutional:      General: She is not in acute distress.    Appearance: She is not toxic-appearing.  Cardiovascular:     Rate and Rhythm: Regular rhythm. Tachycardia present.  Pulmonary:     Effort: Pulmonary effort is normal.     Breath sounds: Normal breath sounds.  Abdominal:     Palpations: Abdomen is soft.  Musculoskeletal:     Right lower leg: No edema.     Left lower leg: No edema.  Skin:    General: Skin is warm and dry.  Neurological:     Mental Status: She is alert. Mental status is at baseline. She is disoriented.          Scheduled Medications:   amiodarone   200 mg Oral BID   apixaban   5 mg Oral BID   atorvastatin   40 mg Oral Daily   diltiazem   15 mg Intravenous Once   diltiazem   30 mg Oral Q6H   feeding supplement  237 mL Oral BID BM   pantoprazole   40 mg Oral Daily   QUEtiapine   50 mg Oral QHS   traZODone   50 mg Oral QHS    Continuous Infusions:   diltiazem  (CARDIZEM ) infusion       PRN Medications:  bisacodyl , haloperidol  lactate, ibuprofen , LORazepam , ondansetron  (ZOFRAN ) IV, polyethylene glycol  Antimicrobials from admission:  Anti-infectives (From admission, onward)    Start     Dose/Rate Route Frequency Ordered Stop   12/02/23 0600  cephALEXin  (KEFLEX ) capsule 500 mg        500 mg Oral Every 8 hours 12/01/23 1730 12/04/23 2204   11/30/23 0015  cefTRIAXone  (ROCEPHIN ) 1 g in sodium chloride  0.9 % 100 mL IVPB  Status:  Discontinued        1 g 200 mL/hr over 30 Minutes Intravenous Every 24 hours 11/30/23 0008 12/01/23 1730  Data Reviewed:  I have personally reviewed the following...  CBC: Recent Labs  Lab 11/29/23 2000 11/30/23 0549 12/01/23 0347 12/03/23 1024 12/04/23 0759 12/05/23 0352  WBC 7.5 5.9 4.9 6.6 7.6 7.0  NEUTROABS 6.4  --   --  5.4  --   --   HGB 12.4 11.9* 12.8 12.6 13.3 13.9  HCT 37.7 36.8 38.7 38.4 39.2 41.2  MCV 99.0 99.7 98.2 99.7 95.6 96.0  PLT 214 192 175 216 213 218   Basic Metabolic Panel: Recent Labs  Lab 11/30/23 0549 12/01/23 0347 12/02/23 2140 12/03/23 1024 12/04/23 0759 12/05/23 0352  NA 140 142  --  140 138 140  K 3.4* 4.1  --  4.2 3.6 3.9  CL 109 110  --  106 105 105  CO2 24 25  --  26 25 26   GLUCOSE 86 69*  --  128* 106* 96  BUN 17 20  --  19 17 18   CREATININE 0.81 0.92  --  0.82 0.80 0.83  CALCIUM  8.9 9.2  --  9.1 8.9 9.3  MG  --   --  2.2  --   --   --    GFR: Estimated Creatinine Clearance: 43.9 mL/min (by C-G formula based on SCr of 0.83 mg/dL). Liver Function Tests: Recent Labs  Lab 11/29/23 2000 12/01/23 0347 12/03/23 1024  AST 61* 43* 25  ALT 35 32 28  ALKPHOS 68 62 61  BILITOT 1.5* 1.2 1.1  PROT 7.0 6.5 6.4*  ALBUMIN 3.7 3.3* 3.1*   No results for input(s): "LIPASE", "AMYLASE" in the last 168 hours. No results for input(s): "AMMONIA" in the last 168 hours. Coagulation Profile: No results for input(s): "INR", "PROTIME" in the last 168  hours. Cardiac Enzymes: No results for input(s): "CKTOTAL", "CKMB", "CKMBINDEX", "TROPONINI" in the last 168 hours. BNP (last 3 results) No results for input(s): "PROBNP" in the last 8760 hours. HbA1C: No results for input(s): "HGBA1C" in the last 72 hours. CBG: No results for input(s): "GLUCAP" in the last 168 hours. Lipid Profile: No results for input(s): "CHOL", "HDL", "LDLCALC", "TRIG", "CHOLHDL", "LDLDIRECT" in the last 72 hours. Thyroid  Function Tests: Recent Labs    12/05/23 0352  TSH 2.002   Anemia Panel: No results for input(s): "VITAMINB12", "FOLATE", "FERRITIN", "TIBC", "IRON", "RETICCTPCT" in the last 72 hours. Most Recent Urinalysis On File:     Component Value Date/Time   COLORURINE STRAW (A) 12/04/2023 0525   APPEARANCEUR CLEAR (A) 12/04/2023 0525   APPEARANCEUR Hazy 05/05/2012 0014   LABSPEC 1.006 12/04/2023 0525   LABSPEC 1.017 05/05/2012 0014   PHURINE 7.0 12/04/2023 0525   GLUCOSEU NEGATIVE 12/04/2023 0525   GLUCOSEU NEGATIVE 06/04/2021 1134   HGBUR SMALL (A) 12/04/2023 0525   BILIRUBINUR NEGATIVE 12/04/2023 0525   BILIRUBINUR neg 06/09/2021 1621   BILIRUBINUR Negative 05/05/2012 0014   KETONESUR NEGATIVE 12/04/2023 0525   PROTEINUR NEGATIVE 12/04/2023 0525   UROBILINOGEN 0.2 06/09/2021 1621   UROBILINOGEN 0.2 06/04/2021 1134   NITRITE NEGATIVE 12/04/2023 0525   LEUKOCYTESUR NEGATIVE 12/04/2023 0525   LEUKOCYTESUR 3+ 05/05/2012 0014   Sepsis Labs: @LABRCNTIP (procalcitonin:4,lacticidven:4) Microbiology: Recent Results (from the past 240 hours)  Culture, blood (routine x 2)     Status: None   Collection Time: 11/29/23  8:30 PM   Specimen: BLOOD  Result Value Ref Range Status   Specimen Description BLOOD BLOOD LEFT FOREARM  Final   Special Requests   Final    BOTTLES DRAWN AEROBIC AND ANAEROBIC Blood Culture adequate volume  Culture   Final    NO GROWTH 5 DAYS Performed at [facility], [address] [place], [place] [address]     Report Status 12/04/2023 FINAL  Final  Culture, blood (routine x 2)     Status: None   Collection Time: 11/29/23  8:51 PM   Specimen: BLOOD  Result Value Ref Range Status   Specimen Description BLOOD BLOOD LEFT HAND  Final   Special Requests   Final    BOTTLES DRAWN AEROBIC AND ANAEROBIC Blood Culture results may not be optimal due to an inadequate volume of blood received in culture bottles   Culture   Final    NO GROWTH 5 DAYS Performed at [facility], [address], [place], [place] [address]    Report Status 12/04/2023 FINAL  Final  Urine Culture     Status: Abnormal   Collection Time: 11/29/23 10:30 PM   Specimen: Urine, Random  Result Value Ref Range Status   Specimen Description   Final    URINE, RANDOM Performed at [facility], [address] [place], [place] [address]    Special Requests   Final    NONE Reflexed from [id number] Performed at [facility], [address] [place], [place] [address]    Culture MULTIPLE SPECIES PRESENT, SUGGEST RECOLLECTION (A)  Final   Report Status 12/01/2023 FINAL  Final      Radiology Studies last 3 days: DG Chest Port 1 View Result Date: 12/03/2023 CLINICAL DATA:  Tachycardia EXAM: PORTABLE CHEST 1 VIEW COMPARISON:  Chest radiograph dated 11/29/2023 FINDINGS: Low lung volumes with bronchovascular crowding. Again seen is large hiatal hernia within the left lower thorax. Increased dense left retrocardiac opacity. No pleural effusion or pneumothorax. Similar cardiomediastinal silhouette. No acute osseous abnormality. IMPRESSION: 1. Increased dense left retrocardiac opacity, likely atelectasis. Aspiration or pneumonia can be considered in the appropriate clinical setting. 2. Large hiatal hernia. Electronically Signed   By: Limin  Xu M.D.   On: 12/03/2023 14:28     Time spent 50 min    Melodi Sprung, DO [facility] 12/05/2023, 4:07 PM    Dictation software may have been used to generate the above  note. Typos may occur and escape review in typed/dictated notes. Please contact Dr Authur Leghorn directly for clarity if needed.  Staff may message me via secure chat in Epic  but this may not receive an immediate response,  please page me for urgent matters!  If 7PM-7AM, please contact night coverage [email or web address]

## 2023-12-05 NOTE — Progress Notes (Signed)
 Pt and family states that she has never worn CPAP. Pt has refuse. RRT will cont to assist as needed.

## 2023-12-05 NOTE — Plan of Care (Signed)

## 2023-12-06 DIAGNOSIS — N3000 Acute cystitis without hematuria: Secondary | ICD-10-CM | POA: Diagnosis not present

## 2023-12-06 LAB — BASIC METABOLIC PANEL WITH GFR
Anion gap: 9 (ref 5–15)
BUN: 23 mg/dL (ref 8–23)
CO2: 25 mmol/L (ref 22–32)
Calcium: 9.1 mg/dL (ref 8.9–10.3)
Chloride: 104 mmol/L (ref 98–111)
Creatinine, Ser: 1.02 mg/dL — ABNORMAL HIGH (ref 0.44–1.00)
GFR, Estimated: 53 mL/min — ABNORMAL LOW (ref >60)
Glucose, Bld: 106 mg/dL — ABNORMAL HIGH (ref 70–99)
Potassium: 3.8 mmol/L (ref 3.5–5.1)
Sodium: 138 mmol/L (ref 135–145)

## 2023-12-06 LAB — CBC
HCT: 37.5 % (ref 36.0–46.0)
Hemoglobin: 12.7 g/dL (ref 12.0–15.0)
MCH: 32.3 pg (ref 26.0–34.0)
MCHC: 33.9 g/dL (ref 30.0–36.0)
MCV: 95.4 fL (ref 80.0–100.0)
Platelets: 213 10*3/uL (ref 150–400)
RBC: 3.93 MIL/uL (ref 3.87–5.11)
RDW: 13 % (ref 11.5–15.5)
WBC: 6 10*3/uL (ref 4.0–10.5)
nRBC: 0 % (ref 0.0–0.2)

## 2023-12-06 LAB — MAGNESIUM: Magnesium: 2.3 mg/dL (ref 1.7–2.4)

## 2023-12-06 MED ORDER — DILTIAZEM HCL 60 MG PO TABS
60.0000 mg | ORAL_TABLET | Freq: Four times a day (QID) | ORAL | Status: DC
  Administered 2023-12-07 (×3): 60 mg via ORAL
  Filled 2023-12-06 (×4): qty 1

## 2023-12-06 MED ORDER — DILTIAZEM HCL 25 MG/5ML IV SOLN
10.0000 mg | Freq: Once | INTRAVENOUS | Status: AC
  Administered 2023-12-06: 10 mg via INTRAVENOUS
  Filled 2023-12-06: qty 5

## 2023-12-06 MED ORDER — DILTIAZEM HCL 60 MG PO TABS
30.0000 mg | ORAL_TABLET | Freq: Once | ORAL | Status: AC
  Administered 2023-12-06: 30 mg via ORAL
  Filled 2023-12-06: qty 0.5
  Filled 2023-12-06: qty 1

## 2023-12-06 NOTE — Plan of Care (Signed)
  Problem: Activity: Goal: Risk for activity intolerance will decrease Outcome: Progressing   Problem: Nutrition: Goal: Adequate nutrition will be maintained Outcome: Progressing   Problem: Coping: Goal: Level of anxiety will decrease Outcome: Progressing   Problem: Elimination: Goal: Will not experience complications related to bowel motility Outcome: Progressing   Problem: Elimination: Goal: Will not experience complications related to urinary retention Outcome: Progressing   Problem: Pain Managment: Goal: General experience of comfort will improve and/or be controlled Outcome: Progressing

## 2023-12-06 NOTE — Progress Notes (Addendum)
 Mobility Specialist - Progress Note   12/06/23 1700  Mobility  Activity Ambulated with assistance in room;Stood at bedside;Dangled on edge of bed  Level of Assistance Moderate assist, patient does 50-74%  Assistive Device Front wheel walker  Distance Ambulated (ft) 8 ft  Range of Motion/Exercises Active;Active Assistive;Right leg;Left leg  Activity Response Tolerated well  Mobility visit 1 Mobility     Pt lying in bed upon arrival, utilizing RA. Pt AO to name only. Frequent redirection to task. Pt achieved EOB sitting with maxA and minG for balance. STS x2 with modA on 1st attempt and minA on 2nd attempt. Initial post lean corrected with cueing. Hand-over-hand cueing for proper hand placement. Able to take several steps along EOB and to sink for face washing with min-modA. Assist for RW management and maintaining proper hand placement. Pt dangled EOB and participated in LE therex with multimodal cueing. Pt returned supine and quickly went to sleep. Pt left in bed with alarm set, needs in reach. Sitter at bedside.    Searcy Czech Mobility Specialist 12/06/23, 5:09 PM

## 2023-12-06 NOTE — Care Management Important Message (Signed)
 Important Message  Patient Details  Name: Jill Snow MRN: [id number] Date of Birth: 02-17-1934   Important Message Given:  Yes - Medicare IM     Anise Kerns 12/06/2023, 1:07 PM

## 2023-12-06 NOTE — Progress Notes (Signed)
 Cardizem  gtt stopped at 1130. Pt's heart rate a-fib all day so far. Rate controlled now 3 hours post cardizem  gtt stopped. Heart rate sustaining 90-100's. No complaints.

## 2023-12-06 NOTE — Progress Notes (Addendum)
 PROGRESS NOTE    Jill Snow   UVO:[id number] DOB: 01-03-1934  DOA: 11/29/2023 Date of Service: 12/06/23 which is hospital day 3  PCP: Devorah Fonder, MD    Hospital course / significant events:   HPI: Jill Snow is a 89 y.o. female with medical history significant of advanced dementia, hypertension, hyperlipidemia, diastolic CHF, A-fib on Eliquis , right bundle blockage, who presents from home with weakness. Per son on admission, pt has generalized weakness in the past several days, more confused with agitation.  Her urine has foul smell.   04/14: admitted to hospitalist for UTI 04/15: required antipsychotics overnight for agitation/sundowning.  04/16: UCx multiple species, will continue abx to complete total 5 days. Continued confusion, difficult to redirect, medications adjusted for patient safety and transition to po as able. Needs SNF placement 04/17: no TOC note in chart as of this morning, placement pending  04/18: tachycardic overnight, sinus rhythm. Hx has not been on BB d/t bradycardia, received metoprolol  x1. Still tachycardic, sinus, no complaints. Will w/u for infection - CBC, CMP, UA, CXR. Place on telemetry. Infectious w/u non-revealing, gave fluids, HR improved  04/19: more sleepy this morning, rousing briefly to pain stimuli but not verbalizing, muscle tone is strong all extremities she is pulling away. Per RN was up multiple times overnight. Will check frequently, no focal deficits.  04/20: more alert today. EKG confirms Afib briefly sustaining into 130's but responded well to cardizem  IV w/ addition of po, however back into RVR and needing to start cardizem  gtt.  04/21: back into sinus rhythm, will wean dilt drip, if if rhythm ok and no other complications, can possibly dc tomorrow      Consultants:  none  Procedures/Surgeries: none      ASSESSMENT & PLAN:   UTI (urinary tract infection) No fever or leukocytosis. Have ruled out sepsis. UCx  multiple species  IV Rocephin  --> po keflex  complete 5 days total     Paroxysmal Atrial fibrillation  Sinus Tachycardia overnight 04/17-04/18 Into RVR 04/20, back into sinus 04/21 Amiodarone  200 mg daily --> increased to 200 bid  Eliquis  NO beta blocker d/t hx bradycardia but may consider initiate this Weaning cardizem  drip Cardizem  po currently at 30 mg q6h - consolidate dose if rate remains controlled off drip  Low threshold for cardiology consult but pt is improving   Late onset Alzheimer's dementia with behavioral disturbance Continued confusion, difficult to redirect, medications adjusted for patient safety - she has been wandering into other patient rooms Occasionally very alert, talkative, other times very somnolent falls asleep easily but awakens to voice   prn Haldol  for agitation Prn ativan  for severe agitation / sedation  Scheduled at bedtime seroquel  and trazodone  --> have reduced these given somnolence   Fall and delerium precaution Transitioned most meds to po or prns to IM, if lose IV access would leave it out   HTN (hypertension) BP at goal for age  Monitor VS   Hypercholesterolemia Lipitor    Chronic diastolic CHF (congestive heart failure) 2D echo on 02/11/2023 showed EF 60 to 65% with grade 2 diastolic dysfunction. No CHF exacerbation this admission.  cont Lasix  20 mg daily, home dose Monitor BMP  No concerns based on BMI: Body mass index is 24.81 kg/m.  Underweight - under 18  overweight - 25 to 29 obese - 30 or more Class 1 obesity: BMI of 30.0 to 34 Class 2 obesity: BMI of 35.0 to 39 Class 3 obesity: BMI of 40.0 to 49  Super Morbid Obesity: BMI 50-59 Super-super Morbid Obesity: BMI 60+ Significantly low or high BMI is associated with higher medical risk.  Weight management advised as adjunct to other disease management and risk reduction treatments    DVT prophylaxis: Eliquis  IV fluids: no continuous IV fluids  Nutrition: cardiac diet  Central  lines / other devices: none  Code Status: DNR ACP documentation reviewed:  none on file in [facility]  [employer] needs: SNF rehab placement vs sone states likely he will take her home when ready may need HH/DME  Medical barriers to dispo: Afib RVR, expect dementia may be barrier to rehab, may need LTC vs home w/ family             Subjective / Brief ROS:  Patient sleepy but easily awoken, goes back to sleep readily Denies chest pain or trouble breathing  Baseline confusion    Family Communication: called son left HIPAA compliant voicemail 12/06/23 4:35 PM     Objective Findings:  Vitals:   12/05/23 2349 12/06/23 0450 12/06/23 0540 12/06/23 0917  BP: 112/78 91/69  95/68  Pulse: 78 84  72  Resp: 20 16    Temp: 97.8 F (36.6 C) 97.7 F (36.5 C)  98.6 F (37 C)  TempSrc: Axillary Axillary  Axillary  SpO2: 94% 97%  98%  Weight:   70.6 kg   Height:        Intake/Output Summary (Last 24 hours) at 12/06/2023 1129 Last data filed at 12/06/2023 0900 Gross per 24 hour  Intake 546.72 ml  Output --  Net 546.72 ml   Filed Weights   12/03/23 0517 12/04/23 0608 12/06/23 0540  Weight: 70.9 kg 69.9 kg 70.6 kg    Examination:  Physical Exam Constitutional:      General: She is not in acute distress.    Appearance: She is not toxic-appearing.  Cardiovascular:     Rate and Rhythm: Normal rate and regular rhythm.  Pulmonary:     Effort: Pulmonary effort is normal.     Breath sounds: Normal breath sounds.  Abdominal:     Palpations: Abdomen is soft.  Musculoskeletal:     Right lower leg: No edema.     Left lower leg: No edema.  Skin:    General: Skin is warm and dry.  Neurological:     Mental Status: She is alert. Mental status is at baseline. She is disoriented.          Scheduled Medications:   amiodarone   200 mg Oral BID   apixaban   5 mg Oral BID   atorvastatin   40 mg Oral Daily   diltiazem   30 mg Oral Q6H   feeding supplement  237 mL Oral BID BM    pantoprazole   40 mg Oral Daily   QUEtiapine   50 mg Oral QHS   traZODone   50 mg Oral QHS    Continuous Infusions:  diltiazem  (CARDIZEM ) infusion Stopped (12/06/23 1127)     PRN Medications:  bisacodyl , haloperidol  lactate, ibuprofen , LORazepam , ondansetron  (ZOFRAN ) IV, polyethylene glycol  Antimicrobials from admission:  Anti-infectives (From admission, onward)    Start     Dose/Rate Route Frequency Ordered Stop   12/02/23 0600  cephALEXin  (KEFLEX ) capsule 500 mg        500 mg Oral Every 8 hours 12/01/23 1730 12/04/23 2204   11/30/23 0015  cefTRIAXone  (ROCEPHIN ) 1 g in sodium chloride  0.9 % 100 mL IVPB  Status:  Discontinued        1 g 200  mL/hr over 30 Minutes Intravenous Every 24 hours 11/30/23 0008 12/01/23 1730           Data Reviewed:  I have personally reviewed the following...  CBC: Recent Labs  Lab 11/29/23 2000 11/30/23 0549 12/01/23 0347 12/03/23 1024 12/04/23 0759 12/05/23 0352 12/06/23 0450  WBC 7.5   < > 4.9 6.6 7.6 7.0 6.0  NEUTROABS 6.4  --   --  5.4  --   --   --   HGB 12.4   < > 12.8 12.6 13.3 13.9 12.7  HCT 37.7   < > 38.7 38.4 39.2 41.2 37.5  MCV 99.0   < > 98.2 99.7 95.6 96.0 95.4  PLT 214   < > 175 216 213 218 213   < > = values in this interval not displayed.   Basic Metabolic Panel: Recent Labs  Lab 12/01/23 0347 12/02/23 2140 12/03/23 1024 12/04/23 0759 12/05/23 0352 12/06/23 0450  NA 142  --  140 138 140 138  K 4.1  --  4.2 3.6 3.9 3.8  CL 110  --  106 105 105 104  CO2 25  --  26 25 26 25   GLUCOSE 69*  --  128* 106* 96 106*  BUN 20  --  19 17 18 23   CREATININE 0.92  --  0.82 0.80 0.83 1.02*  CALCIUM  9.2  --  9.1 8.9 9.3 9.1  MG  --  2.2  --   --   --  2.3   GFR: Estimated Creatinine Clearance: 35.7 mL/min (A) (by C-G formula based on SCr of 1.02 mg/dL (H)). Liver Function Tests: Recent Labs  Lab 11/29/23 2000 12/01/23 0347 12/03/23 1024  AST 61* 43* 25  ALT 35 32 28  ALKPHOS 68 62 61  BILITOT 1.5* 1.2 1.1  PROT  7.0 6.5 6.4*  ALBUMIN 3.7 3.3* 3.1*   No results for input(s): "LIPASE", "AMYLASE" in the last 168 hours. No results for input(s): "AMMONIA" in the last 168 hours. Coagulation Profile: No results for input(s): "INR", "PROTIME" in the last 168 hours. Cardiac Enzymes: No results for input(s): "CKTOTAL", "CKMB", "CKMBINDEX", "TROPONINI" in the last 168 hours. BNP (last 3 results) No results for input(s): "PROBNP" in the last 8760 hours. HbA1C: No results for input(s): "HGBA1C" in the last 72 hours. CBG: No results for input(s): "GLUCAP" in the last 168 hours. Lipid Profile: No results for input(s): "CHOL", "HDL", "LDLCALC", "TRIG", "CHOLHDL", "LDLDIRECT" in the last 72 hours. Thyroid  Function Tests: Recent Labs    12/05/23 0352  TSH 2.002   Anemia Panel: No results for input(s): "VITAMINB12", "FOLATE", "FERRITIN", "TIBC", "IRON", "RETICCTPCT" in the last 72 hours. Most Recent Urinalysis On File:     Component Value Date/Time   COLORURINE STRAW (A) 12/04/2023 0525   APPEARANCEUR CLEAR (A) 12/04/2023 0525   APPEARANCEUR Hazy 05/05/2012 0014   LABSPEC 1.006 12/04/2023 0525   LABSPEC 1.017 05/05/2012 0014   PHURINE 7.0 12/04/2023 0525   GLUCOSEU NEGATIVE 12/04/2023 0525   GLUCOSEU NEGATIVE 06/04/2021 1134   HGBUR SMALL (A) 12/04/2023 0525   BILIRUBINUR NEGATIVE 12/04/2023 0525   BILIRUBINUR neg 06/09/2021 1621   BILIRUBINUR Negative 05/05/2012 0014   KETONESUR NEGATIVE 12/04/2023 0525   PROTEINUR NEGATIVE 12/04/2023 0525   UROBILINOGEN 0.2 06/09/2021 1621   UROBILINOGEN 0.2 06/04/2021 1134   NITRITE NEGATIVE 12/04/2023 0525   LEUKOCYTESUR NEGATIVE 12/04/2023 0525   LEUKOCYTESUR 3+ 05/05/2012 0014   Sepsis Labs: @LABRCNTIP (procalcitonin:4,lacticidven:4) Microbiology: Recent Results (from the past 240 hours)  Culture, blood (routine x 2)     Status: None   Collection Time: 11/29/23  8:30 PM   Specimen: BLOOD  Result Value Ref Range Status   Specimen Description BLOOD  BLOOD LEFT FOREARM  Final   Special Requests   Final    BOTTLES DRAWN AEROBIC AND ANAEROBIC Blood Culture adequate volume   Culture   Final    NO GROWTH 5 DAYS Performed at [facility], [address] [place], [place] [address]    Report Status 12/04/2023 FINAL  Final  Culture, blood (routine x 2)     Status: None   Collection Time: 11/29/23  8:51 PM   Specimen: BLOOD  Result Value Ref Range Status   Specimen Description BLOOD BLOOD LEFT HAND  Final   Special Requests   Final    BOTTLES DRAWN AEROBIC AND ANAEROBIC Blood Culture results may not be optimal due to an inadequate volume of blood received in culture bottles   Culture   Final    NO GROWTH 5 DAYS Performed at [facility], [address] [place], [place] [address]    Report Status 12/04/2023 FINAL  Final  Urine Culture     Status: Abnormal   Collection Time: 11/29/23 10:30 PM   Specimen: Urine, Random  Result Value Ref Range Status   Specimen Description   Final    URINE, RANDOM Performed at [facility], [address] [place], [place] [address]    Special Requests   Final    NONE Reflexed from [id number] Performed at [facility], [address] [place], [place] [address]    Culture MULTIPLE SPECIES PRESENT, SUGGEST RECOLLECTION (A)  Final   Report Status 12/01/2023 FINAL  Final      Radiology Studies last 3 days: DG Chest Port 1 View Result Date: 12/03/2023 CLINICAL DATA:  Tachycardia EXAM: PORTABLE CHEST 1 VIEW COMPARISON:  Chest radiograph dated 11/29/2023 FINDINGS: Low lung volumes with bronchovascular crowding. Again seen is large hiatal hernia within the left lower thorax. Increased dense left retrocardiac opacity. No pleural effusion or pneumothorax. Similar cardiomediastinal silhouette. No acute osseous abnormality. IMPRESSION: 1. Increased dense left retrocardiac opacity, likely atelectasis. Aspiration or pneumonia can be considered in the appropriate clinical setting.  2. Large hiatal hernia. Electronically Signed   By: Limin  Xu M.D.   On: 12/03/2023 14:28     Time spent 50 min    Melodi Sprung, DO [facility] 12/06/2023, 11:29 AM    Dictation software may have been used to generate the above note. Typos may occur and escape review in typed/dictated notes. Please contact Dr Authur Leghorn directly for clarity if needed.  Staff may message me via secure chat in Epic  but this may not receive an immediate response,  please page me for urgent matters!  If 7PM-7AM, please contact night coverage [email or web address]

## 2023-12-07 DIAGNOSIS — I4892 Unspecified atrial flutter: Secondary | ICD-10-CM | POA: Diagnosis not present

## 2023-12-07 MED ORDER — DILTIAZEM HCL 30 MG PO TABS
90.0000 mg | ORAL_TABLET | Freq: Four times a day (QID) | ORAL | Status: DC
  Administered 2023-12-07 – 2023-12-08 (×3): 90 mg via ORAL
  Filled 2023-12-07 (×2): qty 3

## 2023-12-07 MED ORDER — CHLORHEXIDINE GLUCONATE CLOTH 2 % EX PADS
6.0000 | MEDICATED_PAD | Freq: Every day | CUTANEOUS | Status: DC
  Administered 2023-12-07 – 2023-12-10 (×4): 6 via TOPICAL

## 2023-12-07 NOTE — Progress Notes (Signed)
 Found PT had exited bed and was standing in room. Pt was very unsteady on her feet and nearly fell before I assisted her. There was no fall but PT is and remains a very high fall risk. No safety aide and no tele sitter device was available. I remained in her room when able to to do charting and re orientate her and keep her safe. Any female voice she hears, she associates it as [name] (her son) and tries to get out of bed. Bed alarm remain on. Will inquire about posey belt order from Dr. Authur Leghorn.

## 2023-12-07 NOTE — TOC Progression Note (Signed)
 Transition of Care [facility]) - Progression Note    Patient Details  Name: Jill Snow MRN: [id number] Date of Birth: Jul 15, 1934  Transition of Care [facility]) CM/SW Contact  Baird Bombard, RN Phone Number: 12/07/2023, 10:52 AM  Clinical Narrative:    Spoke with patient's son,[name] regarding discharge plans. He was advised [employer] was obtained. [name] decided patient will discharge home. "I'll take care of her. " He inquired about how many feet patient walked. He was advised patient walked 36ft min A 4/17.[name] is agreeable to [facility] and was provided choices for [employer], and [employer]. He does not have a preference of HH agencies. He was advised the accepting [facility] agency will contact him within 48 hours of discharge to scheduled SOC.   Referral sent to and accepted by [facility] from [place].           Expected Discharge Plan and Services                                               Social Determinants of Health (SDOH) Interventions SDOH Screenings   Food Insecurity: No Food Insecurity (11/30/2023)  Housing: Low Risk  (11/30/2023)  Transportation Needs: No Transportation Needs (11/30/2023)  Utilities: Not At Risk (11/30/2023)  Depression (PHQ2-9): Low Risk  (11/10/2022)  Financial Resource Strain: Low Risk  (06/02/2021)  Physical Activity: Insufficiently Active (06/02/2021)  Social Connections: Socially Isolated (11/30/2023)  Stress: No Stress Concern Present (06/02/2021)  Tobacco Use: Low Risk  (11/30/2023)    Readmission Risk Interventions     No data to display

## 2023-12-07 NOTE — Progress Notes (Addendum)
 PROGRESS NOTE    Jill Snow   VWU:[id number] DOB: March 29, 1934  DOA: 11/29/2023 Date of Service: 12/07/23 which is hospital day 4  PCP: Devorah Fonder, MD   Hospital course / significant events:   HPI: Jill Snow is a 89 y.o. female with medical history significant of advanced dementia, hypertension, hyperlipidemia, diastolic CHF, A-fib on Eliquis , right bundle blockage, who presents from home with weakness. Per son on admission, pt has generalized weakness in the past several days, more confused with agitation.  Her urine has foul smell.   04/14: admitted to hospitalist for UTI 04/15: required antipsychotics overnight for agitation/sundowning.  04/16: UCx multiple species, will continue abx to complete total 5 days. Continued confusion, difficult to redirect, medications adjusted for patient safety and transition to po as able. Needs SNF placement 04/17: no TOC note in chart as of this morning, placement pending  04/18: tachycardic overnight, sinus rhythm. Hx has not been on BB d/t bradycardia, received metoprolol  x1. Still tachycardic, sinus, no complaints. Infectious w/u non-revealing, gave fluids, HR improved  04/19: more sleepy this morning, rousing briefly to pain stimuli but not verbalizing, muscle tone is strong all extremities she is pulling away from me on exam. Per RN was up multiple times overnight. Will check frequently, no focal deficits.  04/20: more alert today. EKG confirms Afib briefly sustaining into 130's but responded well to cardizem  IV w/ addition of po, however back into RVR and needing to start cardizem  gtt.  04/21: back into sinus rhythm, will taper off dilt drip 04/22: Afib w/ HR around 90s-110s not RVR but not quite at goal. D/w cardiology [facility] they have increased cardizem  further, Monitoring another day w/ med changes.      Consultants:  Cardiology   Procedures/Surgeries: none      ASSESSMENT & PLAN:   UTI (urinary tract  infection) No fever or leukocytosis. Have ruled out sepsis. UCx multiple species  IV Rocephin  --> po keflex  complete 5 days total     Paroxysmal Atrial fibrillation  Sinus Tachycardia overnight 04/17-04/18 Into RVR 04/20, back into sinus 04/21 Amiodarone  200 mg daily --> increased to 200 bid  Eliquis  NO beta blocker d/t hx bradycardia but may consider initiate this Cardizem  po increased Appreciate cardiology assistance  Monitor through tonight, pend HR tomorrow may be able to d/c if ok w/ cardiology   Late onset Alzheimer's dementia with behavioral disturbance Continued confusion, difficult to redirect, medications adjusted for patient safety - she has been wandering into other patient rooms Occasionally very alert, talkative, other times very somnolent falls asleep easily but awakens to voice   prn Haldol  for agitation Prn ativan  for severe agitation / sedation  Scheduled at bedtime seroquel  and trazodone  --> have reduced these given somnolence   Fall and delerium precaution Transitioned most meds to po or prns to IM, if lose IV access would leave it out  Sitter / restraints as needed for safety   HTN (hypertension) BP at goal for age  Monitor VS   Hypercholesterolemia Lipitor    Diastolic dysfunction with no hx CHF exacerbation 2D echo on 02/11/2023 showed EF 60 to 65% with grade 2 diastolic dysfunction. No CHF exacerbation this admission.  cont Lasix  20 mg daily, home dose Monitor BMP  No concerns based on BMI: Body mass index is 24.81 kg/m.  Underweight - under 18  overweight - 25 to 29 obese - 30 or more Class 1 obesity: BMI of 30.0 to 34 Class 2 obesity: BMI of 35.0  to 39 Class 3 obesity: BMI of 40.0 to 49 Super Morbid Obesity: BMI 50-59 Super-super Morbid Obesity: BMI 60+ Significantly low or high BMI is associated with higher medical risk.  Weight management advised as adjunct to other disease management and risk reduction treatments    DVT prophylaxis:  Eliquis  IV fluids: no continuous IV fluids  Nutrition: cardiac diet  Central lines / other devices: none  Code Status: DNR ACP documentation reviewed:  none on file in [facility]  [facility] needs: SNF rehab has been declined by family, plan for home w/ [facility] Medical barriers to dispo: Afib elevated HR            Subjective / Brief ROS:  Patient sleepy but easily awoken, goes back to sleep readily Denies chest pain or trouble breathing  Baseline confusion    Family Communication: spoke w/ son this morning, advised of issues w/ HR will ask cardiology input, will keep through tonight, possible for DC tomorrow    Objective Findings:  Vitals:   12/07/23 0022 12/07/23 0354 12/07/23 0500 12/07/23 0721  BP: (!) 111/56 (!) 106/50  108/66  Pulse: 71 89  75  Resp: 18 18  18   Temp: 98.1 F (36.7 C) 98.4 F (36.9 C)    TempSrc: Oral     SpO2: 98% 96%  99%  Weight:   69.1 kg   Height:        Intake/Output Summary (Last 24 hours) at 12/07/2023 0918 Last data filed at 12/07/2023 0529 Gross per 24 hour  Intake 120 ml  Output 1680 ml  Net -1560 ml   Filed Weights   12/04/23 0608 12/06/23 0540 12/07/23 0500  Weight: 69.9 kg 70.6 kg 69.1 kg    Examination:  Physical Exam Constitutional:      General: She is not in acute distress.    Appearance: She is not toxic-appearing.  Cardiovascular:     Rate and Rhythm: Tachycardia present. Rhythm irregular.  Pulmonary:     Effort: Pulmonary effort is normal.     Breath sounds: Normal breath sounds.  Abdominal:     Palpations: Abdomen is soft.  Musculoskeletal:     Right lower leg: No edema.     Left lower leg: No edema.  Skin:    General: Skin is warm and dry.  Neurological:     Mental Status: She is alert. Mental status is at baseline. She is disoriented.          Scheduled Medications:   amiodarone   200 mg Oral BID   apixaban   5 mg Oral BID   atorvastatin   40 mg Oral Daily   diltiazem   60 mg Oral Q6H   feeding supplement   237 mL Oral BID BM   pantoprazole   40 mg Oral Daily   QUEtiapine   50 mg Oral QHS   traZODone   50 mg Oral QHS    Continuous Infusions:     PRN Medications:  bisacodyl , haloperidol  lactate, ibuprofen , LORazepam , ondansetron  (ZOFRAN ) IV, polyethylene glycol  Antimicrobials from admission:  Anti-infectives (From admission, onward)    Start     Dose/Rate Route Frequency Ordered Stop   12/02/23 0600  cephALEXin  (KEFLEX ) capsule 500 mg        500 mg Oral Every 8 hours 12/01/23 1730 12/04/23 2204   11/30/23 0015  cefTRIAXone  (ROCEPHIN ) 1 g in sodium chloride  0.9 % 100 mL IVPB  Status:  Discontinued        1 g 200 mL/hr over 30 Minutes Intravenous Every  24 hours 11/30/23 0008 12/01/23 1730           Data Reviewed:  I have personally reviewed the following...  CBC: Recent Labs  Lab 12/01/23 0347 12/03/23 1024 12/04/23 0759 12/05/23 0352 12/06/23 0450  WBC 4.9 6.6 7.6 7.0 6.0  NEUTROABS  --  5.4  --   --   --   HGB 12.8 12.6 13.3 13.9 12.7  HCT 38.7 38.4 39.2 41.2 37.5  MCV 98.2 99.7 95.6 96.0 95.4  PLT 175 216 213 218 213   Basic Metabolic Panel: Recent Labs  Lab 12/01/23 0347 12/02/23 2140 12/03/23 1024 12/04/23 0759 12/05/23 0352 12/06/23 0450  NA 142  --  140 138 140 138  K 4.1  --  4.2 3.6 3.9 3.8  CL 110  --  106 105 105 104  CO2 25  --  26 25 26 25   GLUCOSE 69*  --  128* 106* 96 106*  BUN 20  --  19 17 18 23   CREATININE 0.92  --  0.82 0.80 0.83 1.02*  CALCIUM  9.2  --  9.1 8.9 9.3 9.1  MG  --  2.2  --   --   --  2.3   GFR: Estimated Creatinine Clearance: 35.7 mL/min (A) (by C-G formula based on SCr of 1.02 mg/dL (H)). Liver Function Tests: Recent Labs  Lab 12/01/23 0347 12/03/23 1024  AST 43* 25  ALT 32 28  ALKPHOS 62 61  BILITOT 1.2 1.1  PROT 6.5 6.4*  ALBUMIN 3.3* 3.1*   No results for input(s): "LIPASE", "AMYLASE" in the last 168 hours. No results for input(s): "AMMONIA" in the last 168 hours. Coagulation Profile: No results for  input(s): "INR", "PROTIME" in the last 168 hours. Cardiac Enzymes: No results for input(s): "CKTOTAL", "CKMB", "CKMBINDEX", "TROPONINI" in the last 168 hours. BNP (last 3 results) No results for input(s): "PROBNP" in the last 8760 hours. HbA1C: No results for input(s): "HGBA1C" in the last 72 hours. CBG: No results for input(s): "GLUCAP" in the last 168 hours. Lipid Profile: No results for input(s): "CHOL", "HDL", "LDLCALC", "TRIG", "CHOLHDL", "LDLDIRECT" in the last 72 hours. Thyroid  Function Tests: Recent Labs    12/05/23 0352  TSH 2.002   Anemia Panel: No results for input(s): "VITAMINB12", "FOLATE", "FERRITIN", "TIBC", "IRON", "RETICCTPCT" in the last 72 hours. Most Recent Urinalysis On File:     Component Value Date/Time   COLORURINE STRAW (A) 12/04/2023 0525   APPEARANCEUR CLEAR (A) 12/04/2023 0525   APPEARANCEUR Hazy 05/05/2012 0014   LABSPEC 1.006 12/04/2023 0525   LABSPEC 1.017 05/05/2012 0014   PHURINE 7.0 12/04/2023 0525   GLUCOSEU NEGATIVE 12/04/2023 0525   GLUCOSEU NEGATIVE 06/04/2021 1134   HGBUR SMALL (A) 12/04/2023 0525   BILIRUBINUR NEGATIVE 12/04/2023 0525   BILIRUBINUR neg 06/09/2021 1621   BILIRUBINUR Negative 05/05/2012 0014   KETONESUR NEGATIVE 12/04/2023 0525   PROTEINUR NEGATIVE 12/04/2023 0525   UROBILINOGEN 0.2 06/09/2021 1621   UROBILINOGEN 0.2 06/04/2021 1134   NITRITE NEGATIVE 12/04/2023 0525   LEUKOCYTESUR NEGATIVE 12/04/2023 0525   LEUKOCYTESUR 3+ 05/05/2012 0014   Sepsis Labs: @LABRCNTIP (procalcitonin:4,lacticidven:4) Microbiology: Recent Results (from the past 240 hours)  Culture, blood (routine x 2)     Status: None   Collection Time: 11/29/23  8:30 PM   Specimen: BLOOD  Result Value Ref Range Status   Specimen Description BLOOD BLOOD LEFT FOREARM  Final   Special Requests   Final    BOTTLES DRAWN AEROBIC AND ANAEROBIC Blood Culture adequate volume  Culture   Final    NO GROWTH 5 DAYS Performed at [facility], [address] [place], [place] [address]    Report Status 12/04/2023 FINAL  Final  Culture, blood (routine x 2)     Status: None   Collection Time: 11/29/23  8:51 PM   Specimen: BLOOD  Result Value Ref Range Status   Specimen Description BLOOD BLOOD LEFT HAND  Final   Special Requests   Final    BOTTLES DRAWN AEROBIC AND ANAEROBIC Blood Culture results may not be optimal due to an inadequate volume of blood received in culture bottles   Culture   Final    NO GROWTH 5 DAYS Performed at [facility], [address], [place], [place] [address]    Report Status 12/04/2023 FINAL  Final  Urine Culture     Status: Abnormal   Collection Time: 11/29/23 10:30 PM   Specimen: Urine, Random  Result Value Ref Range Status   Specimen Description   Final    URINE, RANDOM Performed at [facility], [address] [place], [place] [address]    Special Requests   Final    NONE Reflexed from [id number] Performed at [facility], [address] [place], [place] [address]    Culture MULTIPLE SPECIES PRESENT, SUGGEST RECOLLECTION (A)  Final   Report Status 12/01/2023 FINAL  Final      Radiology Studies last 3 days: DG Chest Port 1 View Result Date: 12/03/2023 CLINICAL DATA:  Tachycardia EXAM: PORTABLE CHEST 1 VIEW COMPARISON:  Chest radiograph dated 11/29/2023 FINDINGS: Low lung volumes with bronchovascular crowding. Again seen is large hiatal hernia within the left lower thorax. Increased dense left retrocardiac opacity. No pleural effusion or pneumothorax. Similar cardiomediastinal silhouette. No acute osseous abnormality. IMPRESSION: 1. Increased dense left retrocardiac opacity, likely atelectasis. Aspiration or pneumonia can be considered in the appropriate clinical setting. 2. Large hiatal hernia. Electronically Signed   By: Limin  Xu M.D.   On: 12/03/2023 14:28     Time spent 50 min    Melodi Sprung, DO [facility] 12/07/2023, 9:18 AM    Dictation  software may have been used to generate the above note. Typos may occur and escape review in typed/dictated notes. Please contact Dr Authur Leghorn directly for clarity if needed.  Staff may message me via secure chat in Epic  but this may not receive an immediate response,  please page me for urgent matters!  If 7PM-7AM, please contact night coverage [email or web address]

## 2023-12-07 NOTE — Progress Notes (Signed)
 PT Cancellation Note  Patient Details Name: Jill Snow MRN: [id number] DOB: Mar 27, 1934   Cancelled Treatment:    Reason Eval/Treat Not Completed: Other (comment). Noted change in status with pt transitioning to higher level of care. Discussed with MD about need for new orders. Per MD, family deciding about dispo and MD will place new orders if therapy needs are present. Will cancel current orders at this time. Please re-order as necessary.   Kayce Betty 12/07/2023, 4:39 PM Amparo Balk, PT, DPT, [facility] [phone]

## 2023-12-07 NOTE — Consult Note (Signed)
 Cardiology Consultation   Patient ID: Jill Snow MRN: [id number]; DOB: July 01, 1934  Admit date: 11/29/2023 Date of Consult: 12/07/2023  PCP:  Devorah Fonder, MD   [facility] Providers Cardiologist:  None        Patient Profile:   Jill Snow is a 89 y.o. female with a hx of advanced dementia, hypertension, hyperlipidemia, diastolic dysfunction, atrial fibrillation/flutter on Eliquis , and RBBB who is being seen 12/07/2023 for the evaluation of atrial fibrillation/flutter with RVR at the request of Dr. Authur Leghorn.  History of Present Illness:   Ms. Marlar follows with Dr. Gollan. She has longstanding history of atrial fibrillation dating back to 04/2013. At that time requiring TEE cardioversion due to difficult to control rates. Echo at that time showed EF 45 to 50% with mildly dilated LA/RA, moderate MR. Was started on DOAC at that time but later discontinued. Restarted on DOAC 01/2020.  Hospitalized 04/2020 for A-fib with RVR. Converted to normal sinus rhythm on IV amiodarone . She was discharged on oral amiodarone  and Eliquis . She was most recently seen by Dr. Gollan 04/2023 and was overall doing well from a cardiac perspective. She was maintaining sinus rhythm. Not on beta-blocker secondary to bradycardia. She was continued on Eliquis  and amiodarone .  She presented to the ED 4/14 for weakness and increased confusion with agitation in addition to foul-smelling urine. She was admitted for UTI with urine culture positive for multiple species. She has been treated with antibiotics. Hospitalization complicated by increased confusion and behavioral disturbances requiring PRN haldol  and ativan . Noted to have tachycardia overnight on 4/17. EKG done and read as sinus tachycardia, rate 140 bpm. Although could be 2:1 atrial flutter. Repeat EKG done 4/20 which showed atrial fibrillation.  Initially given IV diltiazem  with improvement in rates. However, patient went back into RVR  and started on IV diltiazem  drip. Per internal medicine notes, patient went back into sinus rhythm 4/21 and patient was tapered off dilt drip. On my review of telemetry, it does not appear the patient had converted to sinus rhythm. She remains in atrial flutter with elevated rates. Cardiology was asked to consult for further management of atrial fibrillation/flutter with RVR. On my exam, patient is pleasantly confused. She is a poor historian given baseline dementia. She denies chest pain, shortness of breath, and palpitations.   Past Medical History:  Diagnosis Date   Atrial flutter (HCC)    a. s/p successful TEE/DCCV in 2014; b. TEE 2014 showed EF 45-50%, mild bi-atrial enlargement, mod MR   Dyspnea    History of chicken pox    Hypercholesterolemia    Hypertension    Mitral regurgitation    a. echo 2014: EF 40-45%, mildly dilated RV, mod reduced RV systolic fxn, mod dilated LA, Mild to mod MR, mod TR, mildly elevated PASP   PAF (paroxysmal atrial fibrillation) (HCC)    a. initial episode 2011; b. not on long term anticoagulation since 06/2013   RBBB (right bundle branch block)    Seasonal allergies    Sleep apnea    a. on CPAP   Thyroid  nodule 05/19/2017    Past Surgical History:  Procedure Laterality Date   ABDOMINAL HYSTERECTOMY     TONSILLECTOMY AND ADENOIDECTOMY  1947   tummy tuck         Inpatient Medications: Scheduled Meds:  amiodarone   200 mg Oral BID   apixaban   5 mg Oral BID   atorvastatin   40 mg Oral Daily   Chlorhexidine  Gluconate Cloth  6 each Topical Daily   diltiazem   60 mg Oral Q6H   feeding supplement  237 mL Oral BID BM   pantoprazole   40 mg Oral Daily   QUEtiapine   50 mg Oral QHS   traZODone   50 mg Oral QHS   Continuous Infusions:  PRN Meds: bisacodyl , haloperidol  lactate, ibuprofen , LORazepam , ondansetron  (ZOFRAN ) IV, polyethylene glycol  Allergies:    Allergies  Allergen Reactions   Amoxicillin  Nausea Only   Augmentin  [Amoxicillin -Pot  Clavulanate] Nausea And Vomiting   Codeine Anaphylaxis   Crestor  [Rosuvastatin ] Other (See Comments)    Myalgias     Penicillins Other (See Comments)    Unknown reaction Tolerates cephalosporins     Social History:   Social History   Socioeconomic History   Marital status: Widowed    Spouse name: Not on file   Number of children: 2   Years of education: Not on file   Highest education level: Not on file  Occupational History   Occupation: REALTOR    Employer: [employer]  Tobacco Use   Smoking status: Never   Smokeless tobacco: Never  Vaping Use   Vaping status: Never Used  Substance and Sexual Activity   Alcohol use: No   Drug use: No   Sexual activity: Never  Other Topics Concern   Not on file  Social History Narrative   Not on file   Social Drivers of Health   Financial Resource Strain: Low Risk  (06/02/2021)   Overall Financial Resource Strain (CARDIA)    Difficulty of Paying Living Expenses: Not hard at all  Food Insecurity: No Food Insecurity (11/30/2023)   Hunger Vital Sign    Worried About Running Out of Food in the Last Year: Never true    Ran Out of Food in the Last Year: Never true  Transportation Needs: No Transportation Needs (11/30/2023)   PRAPARE - Administrator, Civil Service (Medical): No    Lack of Transportation (Non-Medical): No  Physical Activity: Insufficiently Active (06/02/2021)   Exercise Vital Sign    Days of Exercise per Week: 3 days    Minutes of Exercise per Session: 20 min  Stress: No Stress Concern Present (06/02/2021)   [employer] of Occupational Health - Occupational Stress Questionnaire    Feeling of Stress : Not at all  Social Connections: Socially Isolated (11/30/2023)   Social Connection and Isolation Panel [NHANES]    Frequency of Communication with Friends and Family: More than three times a week    Frequency of Social Gatherings with Friends and Family: More than three times a week    Attends  Religious Services: Never    Database administrator or Organizations: No    Attends Banker Meetings: Never    Marital Status: Widowed  Intimate Partner Violence: Patient Unable To Answer (11/30/2023)   Humiliation, Afraid, Rape, and Kick questionnaire    Fear of Current or Ex-Partner: Patient unable to answer    Emotionally Abused: Patient unable to answer    Physically Abused: Patient unable to answer    Sexually Abused: Patient unable to answer    Family History:    Family History  Problem Relation Age of Onset   Stroke Mother    Heart attack Mother    Arthritis Mother    Hyperlipidemia Mother    Heart disease Mother    Diabetes Mother    Diabetes Sister    Cancer Daughter    Heart disease Daughter  Diabetes Daughter    Diabetes Son    Diabetes Maternal Grandmother    Diabetes Maternal Grandfather      ROS:  Please see the history of present illness.   Physical Exam/Data:   Vitals:   12/07/23 0354 12/07/23 0500 12/07/23 0721 12/07/23 1455  BP: (!) 106/50  108/66 107/63  Pulse: 89  75 70  Resp: 18  18 16   Temp: 98.4 F (36.9 C)   98.1 F (36.7 C)  TempSrc:      SpO2: 96%  99% 98%  Weight:  69.1 kg    Height:        Intake/Output Summary (Last 24 hours) at 12/07/2023 1548 Last data filed at 12/07/2023 0529 Gross per 24 hour  Intake 120 ml  Output 950 ml  Net -830 ml      12/07/2023    5:00 AM 12/06/2023    5:40 AM 12/04/2023    6:08 AM  Last 3 Weights  Weight (lbs) 152 lb 5.4 oz 155 lb 10.3 oz 154 lb 1.6 oz  Weight (kg) 69.1 kg 70.6 kg 69.899 kg     Body mass index is 24.59 kg/m.  General:  Well nourished, well developed, in no acute distress HEENT: normal Neck: no JVD Vascular: No carotid bruits; Distal pulses 2+ bilaterally Cardiac: IRIR; normal S1, S2; no murmur  Lungs:  clear to auscultation bilaterally, no wheezing, rhonchi or rales  Abd: soft, nontender, no hepatomegaly  Ext: no edema Skin: warm and dry  Psych: Pleasantly  confused  EKG:  The EKG was personally reviewed and demonstrates: Initial EKG 4/14 shows sinus rhythm. EKG 4/17 shows possible 2:1 atrial flutter vs sinus tachy with rate 140 bpm. EKG 4/20 shows atrial fibrillation/flutter rate 120 bpm. All with known RBBB.  Telemetry:  Telemetry was personally reviewed and demonstrates:  atrial flutter with rate 90-110 bpm on average, up to 130s bpm. Reviewed past 48 hours of telemetry, no evidence of sinus rhythm during that time.   Relevant CV Studies:  02/11/2023 Echo complete 1. Left ventricular ejection fraction, by estimation, is 60 to 65%. The  left ventricle has normal function. The left ventricle has no regional  wall motion abnormalities. Left ventricular diastolic parameters are  consistent with Grade II diastolic  dysfunction (pseudonormalization).   2. Right ventricular systolic function is normal. The right ventricular  size is normal. Tricuspid regurgitation signal is inadequate for assessing  PA pressure.   3. Left atrial size was moderately dilated.   4. The mitral valve is degenerative. Mild to moderate mitral valve  regurgitation.   5. The aortic valve is tricuspid. Aortic valve regurgitation is not  visualized. Aortic valve sclerosis/calcification is present, without any  evidence of aortic stenosis.   Laboratory Data:  High Sensitivity Troponin:   Recent Labs  Lab 11/29/23 2000  TROPONINIHS 2     Chemistry Recent Labs  Lab 12/02/23 2140 12/03/23 1024 12/04/23 0759 12/05/23 0352 12/06/23 0450  NA  --    < > 138 140 138  K  --    < > 3.6 3.9 3.8  CL  --    < > 105 105 104  CO2  --    < > 25 26 25   GLUCOSE  --    < > 106* 96 106*  BUN  --    < > 17 18 23   CREATININE  --    < > 0.80 0.83 1.02*  CALCIUM   --    < > 8.9  9.3 9.1  MG 2.2  --   --   --  2.3  GFRNONAA  --    < > >60 >60 53*  ANIONGAP  --    < > 8 9 9    < > = values in this interval not displayed.    Recent Labs  Lab 12/01/23 0347 12/03/23 1024  PROT  6.5 6.4*  ALBUMIN 3.3* 3.1*  AST 43* 25  ALT 32 28  ALKPHOS 62 61  BILITOT 1.2 1.1   Lipids No results for input(s): "CHOL", "TRIG", "HDL", "LABVLDL", "LDLCALC", "CHOLHDL" in the last 168 hours.  Hematology Recent Labs  Lab 12/04/23 0759 12/05/23 0352 12/06/23 0450  WBC 7.6 7.0 6.0  RBC 4.10 4.29 3.93  HGB 13.3 13.9 12.7  HCT 39.2 41.2 37.5  MCV 95.6 96.0 95.4  MCH 32.4 32.4 32.3  MCHC 33.9 33.7 33.9  RDW 13.1 13.1 13.0  PLT 213 218 213   Thyroid   Recent Labs  Lab 12/05/23 0352  TSH 2.002    BNPNo results for input(s): "BNP", "PROBNP" in the last 168 hours.  DDimer No results for input(s): "DDIMER" in the last 168 hours.   Radiology/Studies:  No results found.   Assessment and Plan:   Atrial fibrillation/flutter - Longstanding history of atrial fibrillation/flutter during past hospital admissions - Initial EKG 4/14 shows sinus rhythm. EKG 4/17 shows possible 2:1 atrial flutter vs sinus tachy with rate 140 bpm. EKG 4/20 shows atrial fibrillation/flutter rate 120 bpm.  - Telemetry shows atrial flutter with elevated rates up to 130s bpm, no evidence of sinus rhythm for at least the past 48 hours - Recommend K > 4 and mag > 2 - CHA2DS2VASc at least 21 (age, sex category, HTN, CHF, vascular) - Continue Eliquis  5 mg twice daily - Can continue with amiodarone  200 mg twice daily for now - Will increase diltiazem  to 90 mg q6hr for further rate control - Consider addition of beta blocker - Can also consider DCCV (will need to inquire with family regarding Eliquis  adherence given baseline dementia to determine if TEE is necessary)  UTI - Urine culture positive for multiple species - Antibiotics per IM  Alzheimer's dementia - Patient is pleasantly confused. No family at the bedside to provide idea of patient's baseline.  - Management per IM  Hypertension - BP stable to low, continue to monitor with ongoing afib/flutter rate control  Hyperlipidemia - Continue  atorvastatin   Diastolic dysfunction CHF - Appears euvolemic on exam, no sign of acute exacerbation  For questions or updates, please contact [facility] Please consult [email or web address] for contact info under    Signed, Brodie Cannon, PA-C  12/07/2023 3:48 PM

## 2023-12-08 DIAGNOSIS — N3000 Acute cystitis without hematuria: Secondary | ICD-10-CM | POA: Diagnosis not present

## 2023-12-08 DIAGNOSIS — I4892 Unspecified atrial flutter: Secondary | ICD-10-CM | POA: Diagnosis not present

## 2023-12-08 DIAGNOSIS — R531 Weakness: Principal | ICD-10-CM

## 2023-12-08 DIAGNOSIS — G301 Alzheimer's disease with late onset: Secondary | ICD-10-CM | POA: Diagnosis not present

## 2023-12-08 DIAGNOSIS — I4891 Unspecified atrial fibrillation: Secondary | ICD-10-CM | POA: Diagnosis not present

## 2023-12-08 DIAGNOSIS — N390 Urinary tract infection, site not specified: Secondary | ICD-10-CM | POA: Diagnosis not present

## 2023-12-08 LAB — CBC
HCT: 35.8 % — ABNORMAL LOW (ref 36.0–46.0)
Hemoglobin: 12.1 g/dL (ref 12.0–15.0)
MCH: 32.4 pg (ref 26.0–34.0)
MCHC: 33.8 g/dL (ref 30.0–36.0)
MCV: 96 fL (ref 80.0–100.0)
Platelets: 203 10*3/uL (ref 150–400)
RBC: 3.73 MIL/uL — ABNORMAL LOW (ref 3.87–5.11)
RDW: 13 % (ref 11.5–15.5)
WBC: 6.2 10*3/uL (ref 4.0–10.5)
nRBC: 0 % (ref 0.0–0.2)

## 2023-12-08 LAB — MAGNESIUM: Magnesium: 2.3 mg/dL (ref 1.7–2.4)

## 2023-12-08 LAB — BASIC METABOLIC PANEL WITH GFR
Anion gap: 6 (ref 5–15)
BUN: 23 mg/dL (ref 8–23)
CO2: 25 mmol/L (ref 22–32)
Calcium: 9.1 mg/dL (ref 8.9–10.3)
Chloride: 105 mmol/L (ref 98–111)
Creatinine, Ser: 0.88 mg/dL (ref 0.44–1.00)
GFR, Estimated: >60 mL/min (ref >60)
Glucose, Bld: 117 mg/dL — ABNORMAL HIGH (ref 70–99)
Potassium: 3.9 mmol/L (ref 3.5–5.1)
Sodium: 136 mmol/L (ref 135–145)

## 2023-12-08 MED ORDER — DILTIAZEM HCL ER COATED BEADS 120 MG PO CP24
120.0000 mg | ORAL_CAPSULE | Freq: Two times a day (BID) | ORAL | Status: DC
  Administered 2023-12-08: 120 mg via ORAL
  Filled 2023-12-08: qty 1

## 2023-12-08 MED ORDER — DILTIAZEM HCL ER COATED BEADS 180 MG PO CP24
180.0000 mg | ORAL_CAPSULE | Freq: Two times a day (BID) | ORAL | Status: DC
  Administered 2023-12-08: 180 mg via ORAL
  Filled 2023-12-08: qty 1

## 2023-12-08 NOTE — Progress Notes (Signed)
 PROGRESS NOTE  Jill Snow    DOB: [birth year], 89 y.o.  NFA:[id number]    Code Status: Limited: Do not attempt resuscitation (DNR) -DNR-LIMITED -Do Not Intubate/DNI    DOA: 11/29/2023   LOS: 5   Brief hospital course  Jill Snow is a 89 y.o. female with medical history significant of advanced dementia, hypertension, hyperlipidemia, diastolic CHF, A-fib on Eliquis , right bundle blockage, who presents from home with weakness. Per son on admission, pt has generalized weakness in the past several days, more confused with agitation.   04/14: admitted to hospitalist for UTI 04/15: required antipsychotics overnight for agitation/sundowning.  04/16: UCx multiple species, will continue abx to complete total 5 days. Continued confusion, difficult to redirect, medications adjusted for patient safety and transition to po as able. Needs SNF placement 04/18: tachycardic overnight, sinus rhythm. Hx has not been on BB d/t bradycardia, received metoprolol  x1. Still tachycardic, sinus, no complaints. Infectious w/u non-revealing, gave fluids, HR improved  04/20: more alert today. EKG confirms Afib briefly sustaining into 130's but responded well to cardizem  IV w/ addition of po, however back into RVR and needing to start cardizem  gtt.  04/21: back into sinus rhythm, will taper off dilt drip 04/22: Afib w/ HR around 90s-110s not RVR but not quite at goal. D/w cardiology [facility] they have increased cardizem  further, Monitoring another day w/ med changes.   12/08/23 -further cardiac med adjustments for afib control. Remains stable. Can likely dc tomorrow  Assessment & Plan  Principal Problem:   UTI (urinary tract infection) Active Problems:   HTN (hypertension)   Atrial fibrillation with rapid ventricular response (HCC)   Hypercholesterolemia   Chronic diastolic CHF (congestive heart failure) (HCC)   Late onset Alzheimer's dementia with behavioral disturbance (HCC)   Atrial flutter with rapid  ventricular response (HCC)  UTI (urinary tract infection) No fever or leukocytosis. Have ruled out sepsis. UCx multiple species  IV Rocephin  --> po keflex  complete 5 days total     Paroxysmal Atrial fibrillation  Sinus Tachycardia overnight 04/17-04/18 Into RVR 04/20, back into sinus 04/21 Amiodarone  200 mg daily --> increased to 200 bid  Eliquis  NO beta blocker d/t hx bradycardia but may consider initiate this Cardizem  changed to ER 180 BID Appreciate cardiology assistance  Monitor through tonight, pend HR tomorrow may be able to d/c if ok w/ cardiology    Late onset Alzheimer's dementia with behavioral disturbance Continued confusion, difficult to redirect, medications adjusted for patient safety - she has been wandering into other patient rooms Occasionally very alert, talkative, other times very somnolent falls asleep easily but awakens to voice   prn Haldol  for agitation Prn ativan  for severe agitation / sedation  Scheduled at bedtime seroquel  and trazodone  --> have reduced these given somnolence   Fall and delerium precaution Transitioned most meds to po or prns to IM, if lose IV access would leave it out  Sitter / restraints as needed for safety    HTN (hypertension) BP at goal for age  Monitor VS   Hypercholesterolemia Lipitor    Diastolic dysfunction with no hx CHF exacerbation 2D echo on 02/11/2023 showed EF 60 to 65% with grade 2 diastolic dysfunction. No CHF exacerbation this admission.  cont Lasix  20 mg daily, home dose Monitor BMP  Body mass index is 24.81 kg/m (pended).  VTE ppx:  apixaban  (ELIQUIS ) tablet 5 mg   Diet:     Diet   Diet 2 gram sodium Room service appropriate? Yes with Assist; Fluid consistency:  Thin   Consultants: Cardiology   Subjective 12/08/23    Pt reports feeling well. She discusses going to church yesterday. Clearly confused on circumstances.    Objective   Vitals:   12/07/23 2043 12/08/23 0050 12/08/23 0443 12/08/23 0508   BP: 102/72 106/65 115/73   Pulse: 60 93 (!) 119   Resp: 19 17 18    Temp: 98.1 F (36.7 C) 98 F (36.7 C) 97.8 F (36.6 C)   TempSrc: Oral Axillary    SpO2: 99% 95% 95%   Weight:    (P) 69.7 kg  Height:        Intake/Output Summary (Last 24 hours) at 12/08/2023 0756 Last data filed at 12/08/2023 0646 Gross per 24 hour  Intake 350 ml  Output 700 ml  Net -350 ml   Filed Weights   12/06/23 0540 12/07/23 0500 12/08/23 0508  Weight: 70.6 kg 69.1 kg (P) 69.7 kg     Physical Exam:  General: awake, alert, NAD. Falls asleep easily HEENT: atraumatic, clear conjunctiva, anicteric sclera, MMM, hearing grossly normal Respiratory: normal respiratory effort. Cardiovascular: quick capillary refill Nervous: A&O x1. Disoriented to circumstances  Extremities: moves all equally, no edema, normal tone Skin: dry, intact, normal temperature, normal color. No rashes, lesions or ulcers on exposed skin  Labs   I have personally reviewed the following labs and imaging studies CBC    Component Value Date/Time   WBC 6.2 12/08/2023 0448   RBC 3.73 (L) 12/08/2023 0448   HGB 12.1 12/08/2023 0448   HGB 14.6 05/15/2013 0514   HCT 35.8 (L) 12/08/2023 0448   HCT 43.6 05/15/2013 0514   PLT 203 12/08/2023 0448   PLT 171 05/15/2013 0514   MCV 96.0 12/08/2023 0448   MCV 92 05/15/2013 0514   MCH 32.4 12/08/2023 0448   MCHC 33.8 12/08/2023 0448   RDW 13.0 12/08/2023 0448   RDW 13.9 05/15/2013 0514   LYMPHSABS 0.6 (L) 12/03/2023 1024   LYMPHSABS 1.5 05/15/2013 0514   MONOABS 0.4 12/03/2023 1024   MONOABS 0.4 05/15/2013 0514   EOSABS 0.1 12/03/2023 1024   EOSABS 0.3 05/15/2013 0514   BASOSABS 0.0 12/03/2023 1024   BASOSABS 0.1 05/15/2013 0514      Latest Ref Rng & Units 12/08/2023    4:48 AM 12/06/2023    4:50 AM 12/05/2023    3:52 AM  BMP  Glucose 70 - 99 mg/dL 161  096  96   BUN 8 - 23 mg/dL 23  23  18    Creatinine 0.44 - 1.00 mg/dL 0.45  4.09  8.11   Sodium 135 - 145 mmol/L 136  138  140    Potassium 3.5 - 5.1 mmol/L 3.9  3.8  3.9   Chloride 98 - 111 mmol/L 105  104  105   CO2 22 - 32 mmol/L 25  25  26    Calcium  8.9 - 10.3 mg/dL 9.1  9.1  9.3     No results found.  Disposition Plan & Communication  Patient status: Inpatient  Admitted From: Home Planned disposition location: Home Anticipated discharge date: 4/24 pending HR control  Family Communication: none at bedside    Author: Ree Candy, DO [facility] 12/08/2023, 7:56 AM   Available by Epic secure chat 7AM-7PM. If 7PM-7AM, please contact night-coverage.  [facility] contact information found on [email or web address].

## 2023-12-08 NOTE — Plan of Care (Signed)
  Problem: Clinical Measurements: Goal: Diagnostic test results will improve Outcome: Progressing Goal: Respiratory complications will improve Outcome: Progressing Goal: Cardiovascular complication will be avoided Outcome: Progressing   Problem: Activity: Goal: Risk for activity intolerance will decrease Outcome: Progressing   Problem: Nutrition: Goal: Adequate nutrition will be maintained Outcome: Progressing   Problem: Coping: Goal: Level of anxiety will decrease Outcome: Progressing   Problem: Safety: Goal: Ability to remain free from injury will improve Outcome: Progressing

## 2023-12-08 NOTE — Progress Notes (Addendum)
 Rounding Note    Patient Name: Jill Snow Date of Encounter: 12/08/2023  [facility] Cardiologist: [facility]  Subjective   Laying in bed, had bowel accident requiring nurses to help her cleanup She has no complaints, denies shortness of breath, no chest pain, no leg swelling  Telemetry reviewed remains in atrial flutter rate 90-100  Inpatient Medications    Scheduled Meds:  amiodarone   200 mg Oral BID   apixaban   5 mg Oral BID   atorvastatin   40 mg Oral Daily   Chlorhexidine  Gluconate Cloth  6 each Topical Daily   diltiazem   90 mg Oral Q6H   feeding supplement  237 mL Oral BID BM   pantoprazole   40 mg Oral Daily   QUEtiapine   50 mg Oral QHS   traZODone   50 mg Oral QHS   Continuous Infusions:  PRN Meds: bisacodyl , haloperidol  lactate, ibuprofen , LORazepam , ondansetron  (ZOFRAN ) IV, polyethylene glycol   Vital Signs    Vitals:   12/07/23 2043 12/08/23 0050 12/08/23 0443 12/08/23 0508  BP: 102/72 106/65 115/73   Pulse: 60 93 (!) 119   Resp: 19 17 18    Temp: 98.1 F (36.7 C) 98 F (36.7 C) 97.8 F (36.6 C)   TempSrc: Oral Axillary    SpO2: 99% 95% 95%   Weight:    (P) 69.7 kg  Height:        Intake/Output Summary (Last 24 hours) at 12/08/2023 0911 Last data filed at 12/08/2023 0646 Gross per 24 hour  Intake 350 ml  Output 700 ml  Net -350 ml      12/08/2023    5:08 AM 12/07/2023    5:00 AM 12/06/2023    5:40 AM  Last 3 Weights  Weight (lbs) 153 lb 11.2 oz 152 lb 5.4 oz 155 lb 10.3 oz  Weight (kg) 69.718 kg 69.1 kg 70.6 kg      Telemetry    Atrial flutter rate 90- Personally Reviewed  ECG     - Personally Reviewed  Physical Exam   GEN: No acute distress.   Neck: No JVD Cardiac: Irregularly irregular, no murmurs, rubs, or gallops.  Respiratory: Clear to auscultation bilaterally. GI: Soft, nontender, non-distended  MS: No edema; No deformity. Neuro:  Nonfocal  Psych: Normal affect   Labs    High Sensitivity Troponin:    Recent Labs  Lab 11/29/23 2000  TROPONINIHS 2     Chemistry Recent Labs  Lab 12/02/23 2140 12/03/23 1024 12/04/23 0759 12/05/23 0352 12/06/23 0450 12/08/23 0448  NA  --  140   < > 140 138 136  K  --  4.2   < > 3.9 3.8 3.9  CL  --  106   < > 105 104 105  CO2  --  26   < > 26 25 25   GLUCOSE  --  128*   < > 96 106* 117*  BUN  --  19   < > 18 23 23   CREATININE  --  0.82   < > 0.83 1.02* 0.88  CALCIUM   --  9.1   < > 9.3 9.1 9.1  MG 2.2  --   --   --  2.3 2.3  PROT  --  6.4*  --   --   --   --   ALBUMIN  --  3.1*  --   --   --   --   AST  --  25  --   --   --   --  ALT  --  28  --   --   --   --   ALKPHOS  --  61  --   --   --   --   BILITOT  --  1.1  --   --   --   --   GFRNONAA  --  >60   < > >60 53* >60  ANIONGAP  --  8   < > 9 9 6    < > = values in this interval not displayed.    Lipids No results for input(s): "CHOL", "TRIG", "HDL", "LABVLDL", "LDLCALC", "CHOLHDL" in the last 168 hours.  Hematology Recent Labs  Lab 12/05/23 0352 12/06/23 0450 12/08/23 0448  WBC 7.0 6.0 6.2  RBC 4.29 3.93 3.73*  HGB 13.9 12.7 12.1  HCT 41.2 37.5 35.8*  MCV 96.0 95.4 96.0  MCH 32.4 32.3 32.4  MCHC 33.7 33.9 33.8  RDW 13.1 13.0 13.0  PLT 218 213 203   Thyroid   Recent Labs  Lab 12/05/23 0352  TSH 2.002    BNPNo results for input(s): "BNP", "PROBNP" in the last 168 hours.  DDimer No results for input(s): "DDIMER" in the last 168 hours.   Radiology    No results found.  Cardiac Studies  Echo June 2024 EF 60 to 65%   Patient Profile     89 y.o. female   Assessment & Plan    Atrial fibrillation/flutter Developing atrial flutter this admission, on Eliquis  5 twice daily consistently - Challenging rate control, on amiodarone  200 twice daily, diltiazem  90 every 6 - Will recommend we consolidate diltiazem  ER 180 twice daily - Consider outpatient cardioversion if she remains in normal sinus rhythm  UTI Urine positive multiple species, on antibiotics  Alzheimer's  demantia Progressive disease over the past several years, family has been taking care of her at home  Hypertension Blood pressure well-controlled on current medication regiment  Hyperlipidemia On Lipitor   Diastolic dysfunction  Diastolic CHF Appears euvolemic though will need close monitoring in the setting of atrial flutter in outpatient setting   For questions or updates, please contact [facility] Please consult [email or web address] for contact info under       Signed, Juanda Noon, MD, Ph.D [facility]

## 2023-12-08 NOTE — Progress Notes (Addendum)
 Mobility Specialist - Progress Note   12/08/23 1000  Mobility  Activity Ambulated with assistance in hallway  Level of Assistance Contact guard assist, steadying assist  Assistive Device Front wheel walker  Distance Ambulated (ft) 110 ft  Activity Response Tolerated well  Mobility visit 1 Mobility     Pt lying in bed upon arrival, utilizing RA. Pt pleasant and agreeable to activity. AO to self and month, but not year. Pt completed bed mobility with maxA + cueing for task initiation and sequencing. STS with minA and ambulation in hallway with CGA. Pt does require cueing to stay within AD BOS. Follows commands fairly well. Pt returned to bed with alarm set, needs in reach. HR pre-activity 91 bpm; HR post activity 92 bpm. RN notified.    Searcy Czech Mobility Specialist 12/08/23, 10:41 AM

## 2023-12-08 NOTE — Progress Notes (Signed)
   12/08/23 0443  Assess: MEWS Score  Temp 97.8 F (36.6 C)  BP 115/73  MAP (mmHg) 81  Pulse Rate (!) 119  Resp 18  SpO2 95 %  O2 Device Room Air  Assess: MEWS Score  MEWS Temp 0  MEWS Systolic 0  MEWS Pulse 2  MEWS RR 0  MEWS LOC 0  MEWS Score 2  MEWS Score Color Yellow  Assess: if the MEWS score is Yellow or Red  Were vital signs accurate and taken at a resting state? Yes  Does the patient meet 2 or more of the SIRS criteria? No  MEWS guidelines implemented  No, previously yellow, continue vital signs every 4 hours  Notify: Charge Nurse/RN  Name of Charge Nurse/RN Notified Darnell Elbe, RN  Provider Notification  Provider Name/Title Alonza Arthurs NP  Date Provider Notified 12/08/23  Time Provider Notified [phone]  Method of Notification Page (Secure chat)  Notification Reason Other (Comment) (Yellow MEWS HR100's to 120's)  Provider response No new orders  Date of Provider Response 12/08/23  Time of Provider Response 0507  Assess: SIRS CRITERIA  SIRS Temperature  0  SIRS Respirations  0  SIRS Pulse 1  SIRS WBC 0  SIRS Score Sum  1

## 2023-12-08 NOTE — Progress Notes (Signed)
 Occupational Therapy Treatment Patient Details Name: Jill Snow MRN: [id number] DOB: August 07, 1934 Today's Date: 12/08/2023   History of present illness Pt admitted for UTI. Pt with complaints of weakness and agitation. History of advanced dementia, HTN, and HLD.   OT comments  Pt seen for OT treatment on this date. Upon arrival to room pt resting with sitters at bed side. Pt declined OOB mobility/ADLs, she stated she was sleepy from getting up earlier. Sitter endorses pt participated in amb with mobility prior to OT session. Pt agreed to wash her face and hands at bed level declined coming to sit on EOB. Pt required continuous verbal/tactile cues for sequencing, MOD physical assistance for task completion. Pt calm and pleasant throughout session. Pt making progress toward goals, will continue to follow POC. Discharge recommendation remains appropriate.        If plan is discharge home, recommend the following:  A lot of help with walking and/or transfers;A lot of help with bathing/dressing/bathroom;Assistance with cooking/housework;Direct supervision/assist for medications management;Direct supervision/assist for financial management;Assist for transportation;Help with stairs or ramp for entrance;Supervision due to cognitive status   Equipment Recommendations  Other (comment)    Recommendations for Other Services      Precautions / Restrictions Precautions Precautions: Fall Recall of Precautions/Restrictions: Impaired Restrictions Weight Bearing Restrictions Per Provider Order: No       Mobility Bed Mobility Overal bed mobility: Needs Assistance Bed Mobility: Rolling Rolling: Mod assist (heavy tactile/verbal cues for rolling at bed level)              Transfers Overall transfer level: Needs assistance                 General transfer comment: Refused OOB mobility on this date                                                ADL either  performed or assessed with clinical judgement   ADL Overall ADL's : Needs assistance/impaired Eating/Feeding: Set up;Sitting   Grooming: Wash/dry hands;Wash/dry face;Brushing hair;Bed level;Moderate assistance                                 General ADL Comments: MOD A for grooming tasks completed at bed level, declined OOB ADL/mobility    Communication Communication Communication: Impaired Factors Affecting Communication: Difficulty expressing self;Reduced clarity of speech   Cognition Arousal: Alert Behavior During Therapy: WFL for tasks assessed/performed Cognition: History of cognitive impairments, No family/caregiver present to determine baseline, Cognition impaired   Orientation impairments: Place, Time, Situation Awareness: Intellectual awareness impaired, Online awareness impaired Memory impairment (select all impairments): Short-term memory, Working [employer] fast streamer, Non-declarative long-term memory, Geneticist, molecular long-term memory Attention impairment (select first level of impairment): Focused attention Executive functioning impairment (select all impairments): Initiation, Sequencing, Reasoning, Problem solving, Organization OT - Cognition Comments: Oriented to name only                 Following commands: Intact Following commands impaired: Follows one step commands inconsistently      Cueing   Cueing Techniques: Verbal cues, Gestural cues, Tactile cues, Visual cues  Exercises Exercises: Other exercises Other Exercises Other Exercises: Edu: rolling technique, step by step grooming tasks    Shoulder Instructions       General Comments Sitter in room states  pt has been very calm today, but tired from mobility.    Pertinent Vitals/ Pain       Pain Assessment Pain Assessment: PAINAD Faces Pain Scale: No hurt Breathing: normal Negative Vocalization: none Facial Expression: smiling or inexpressive Body Language: relaxed Consolability: no need to  console PAINAD Score: 0                                                          Frequency  Min 2X/week        Progress Toward Goals  OT Goals(current goals can now be found in the care plan section)  Progress towards OT goals: Progressing toward goals  Acute Rehab OT Goals Patient Stated Goal: feel good OT Goal Formulation: With patient Time For Goal Achievement: 12/15/23 Potential to Achieve Goals: Fair ADL Goals Pt Will Perform Grooming: with contact guard assist;standing Pt Will Perform Lower Body Dressing: with min assist;sit to/from stand Pt Will Transfer to Toilet: with contact guard assist;regular height toilet;ambulating Pt Will Perform Toileting - Clothing Manipulation and hygiene: with mod assist;sit to/from stand  Plan      Co-evaluation                 AM-PAC OT "6 Clicks" Daily Activity     Outcome Measure   Help from another person eating meals?: A Lot Help from another person taking care of personal grooming?: A Lot Help from another person toileting, which includes using toliet, bedpan, or urinal?: A Lot Help from another person bathing (including washing, rinsing, drying)?: Total Help from another person to put on and taking off regular upper body clothing?: A Lot Help from another person to put on and taking off regular lower body clothing?: A Lot 6 Click Score: 11    End of Session    OT Visit Diagnosis: Unsteadiness on feet (R26.81);Other abnormalities of gait and mobility (R26.89);Repeated falls (R29.6);Muscle weakness (generalized) (M62.81)   Activity Tolerance Patient tolerated treatment well   Patient Left in bed;with call bell/phone within reach;with bed alarm set;with nursing/sitter in room   Nurse Communication Mobility status        Time: 4098-1191 OT Time Calculation (min): 9 min  Charges: OT General Charges $OT Visit: 1 Visit OT Treatments $Self Care/Home Management : 8-22 mins  Rosaria Common M.S. OTR/L  12/08/23, 2:02 PM

## 2023-12-09 DIAGNOSIS — R531 Weakness: Secondary | ICD-10-CM

## 2023-12-09 DIAGNOSIS — I4892 Unspecified atrial flutter: Secondary | ICD-10-CM | POA: Diagnosis not present

## 2023-12-09 DIAGNOSIS — N390 Urinary tract infection, site not specified: Secondary | ICD-10-CM | POA: Diagnosis not present

## 2023-12-09 DIAGNOSIS — I5032 Chronic diastolic (congestive) heart failure: Secondary | ICD-10-CM

## 2023-12-09 DIAGNOSIS — G301 Alzheimer's disease with late onset: Secondary | ICD-10-CM | POA: Diagnosis not present

## 2023-12-09 DIAGNOSIS — E7800 Pure hypercholesterolemia, unspecified: Secondary | ICD-10-CM | POA: Diagnosis not present

## 2023-12-09 DIAGNOSIS — I10 Essential (primary) hypertension: Secondary | ICD-10-CM | POA: Diagnosis not present

## 2023-12-09 MED ORDER — DIGOXIN 125 MCG PO TABS
0.1250 mg | ORAL_TABLET | Freq: Every day | ORAL | Status: DC
  Administered 2023-12-10: 0.125 mg via ORAL
  Filled 2023-12-09: qty 1

## 2023-12-09 MED ORDER — DILTIAZEM HCL ER COATED BEADS 120 MG PO CP24
120.0000 mg | ORAL_CAPSULE | Freq: Two times a day (BID) | ORAL | Status: DC
  Administered 2023-12-09 – 2023-12-10 (×2): 120 mg via ORAL
  Filled 2023-12-09 (×2): qty 1

## 2023-12-09 MED ORDER — DILTIAZEM HCL ER COATED BEADS 180 MG PO CP24
180.0000 mg | ORAL_CAPSULE | Freq: Two times a day (BID) | ORAL | Status: DC
  Administered 2023-12-09: 180 mg via ORAL
  Filled 2023-12-09: qty 1

## 2023-12-09 MED ORDER — DIGOXIN 0.25 MG/ML IJ SOLN
0.2500 mg | INTRAMUSCULAR | Status: AC
  Administered 2023-12-09 (×2): 0.25 mg via INTRAVENOUS
  Filled 2023-12-09 (×2): qty 2

## 2023-12-09 NOTE — Progress Notes (Signed)
 PROGRESS NOTE  Jill Snow    DOB: 1933-12-11, 89 y.o.  [id number]:[id number]    Code Status: Limited: Do not attempt resuscitation (DNR) -DNR-LIMITED -Do Not Intubate/DNI    DOA: 11/29/2023   LOS: 6   Brief hospital course  Jill Snow is a 89 y.o. female with medical history significant of advanced dementia, hypertension, hyperlipidemia, diastolic CHF, A-fib on Eliquis , right bundle blockage, who presents from home with weakness. Per son on admission, pt has generalized weakness in the past several days, more confused with agitation.   04/14: admitted to hospitalist for UTI. Treatment, recovery has been complicated by agitation/sundowning requiring sedation and sitter. SNF was recommended but family prefers to take home.  04/16: UCx multiple species, will continue abx to complete total 5 days. Continued confusion, difficult to redirect, medications adjusted for patient safety and transition to po as able. Needs SNF placement 04/18: tachycardic overnight, sinus rhythm. Hx has not been on BB d/t bradycardia, received metoprolol  x1. Still tachycardic, sinus, no complaints. Infectious w/u non-revealing, gave fluids, HR improved  04/20: more alert today. EKG confirms Afib briefly sustaining into 130's but responded well to cardizem  IV w/ addition of po, however back into RVR and needing to start cardizem  gtt.  04/21: back into sinus rhythm, will taper off dilt drip 04/22: Afib w/ HR around 90s-110s not RVR but not quite at goal. D/w cardiology [facility] they have increased cardizem  further, Monitoring another day w/ med changes.   12/09/23 -further cardiac med adjustments for afib control. Remains stable. Can likely dc tomorrow  Assessment & Plan  Principal Problem:   UTI (urinary tract infection) Active Problems:   HTN (hypertension)   Atrial fibrillation with rapid ventricular response (HCC)   Hypercholesterolemia   Chronic diastolic CHF (congestive heart failure) (HCC)   Late onset  Alzheimer's dementia with behavioral disturbance (HCC)   Atrial flutter with rapid ventricular response (HCC)   Weakness  UTI (urinary tract infection) No fever or leukocytosis. Have ruled out sepsis. UCx multiple species  IV Rocephin  --> po keflex  complete 5 days total  PT/OT    Paroxysmal Atrial fibrillation  Sinus Tachycardia overnight 04/17-04/18 Into RVR 04/20, back into sinus 04/21 Amiodarone  200 mg daily --> increased to 200 bid  Eliquis  NO beta blocker d/t hx bradycardia but may consider initiate this Cardizem  changed to ER 120 BID Starting digoxin   Appreciate cardiology assistance  Monitor through tonight, pend HR tomorrow may be able to d/c if ok w/ cardiology    Late onset Alzheimer's dementia with behavioral disturbance Continued confusion, difficult to redirect, medications adjusted for patient safety - she has been wandering into other patient rooms Occasionally very alert, talkative, other times very somnolent falls asleep easily but awakens to voice   prn Haldol  for agitation Prn ativan  for severe agitation / sedation  Scheduled at bedtime seroquel  and trazodone  --> have reduced these given somnolence   Fall and delerium precaution Transitioned most meds to po or prns to IM, if lose IV access would leave it out  Sitter / restraints as needed for safety    HTN (hypertension) BP at goal for age  Monitor VS   Hypercholesterolemia Lipitor    Diastolic dysfunction with no hx CHF exacerbation 2D echo on 02/11/2023 showed EF 60 to 65% with grade 2 diastolic dysfunction. No CHF exacerbation this admission.  cont Lasix  20 mg daily, home dose Monitor BMP  Body mass index is 24.69 kg/m.  VTE ppx:  apixaban  (ELIQUIS ) tablet 5 mg  Diet:     Diet   Diet 2 gram sodium Room service appropriate? Yes with Assist; Fluid consistency: Thin   Consultants: Cardiology   Subjective 12/09/23    Pt reports feeling well. She does not know where she is or date. Denies  complaints.   Objective   Vitals:   12/08/23 1957 12/09/23 0352 12/09/23 0500 12/09/23 0811  BP: 109/65 106/64  99/63  Pulse: (!) 120 98  (!) 110  Resp: 20 18  16   Temp: 98.3 F (36.8 C) 98.1 F (36.7 C)  98.1 F (36.7 C)  TempSrc:    Oral  SpO2: 95% 96%  95%  Weight:   69.4 kg   Height:        Intake/Output Summary (Last 24 hours) at 12/09/2023 0825 Last data filed at 12/09/2023 0359 Gross per 24 hour  Intake 240 ml  Output 1575 ml  Net -1335 ml   Filed Weights   12/07/23 0500 12/08/23 0508 12/09/23 0500  Weight: 69.1 kg (P) 69.7 kg 69.4 kg     Physical Exam:  General: awake, alert, NAD. HEENT: atraumatic, clear conjunctiva, anicteric sclera, MMM, hearing grossly normal Respiratory: normal respiratory effort. Cardiovascular: quick capillary refill Nervous: A&O x1. Disoriented to circumstances. Able to follow commands and move all extremities.  Extremities: moves all equally, no edema, normal tone Skin: dry, intact, normal temperature, normal color. No rashes, lesions or ulcers on exposed skin  Labs   I have personally reviewed the following labs and imaging studies CBC    Component Value Date/Time   WBC 6.2 12/08/2023 0448   RBC 3.73 (L) 12/08/2023 0448   HGB 12.1 12/08/2023 0448   HGB 14.6 05/15/2013 0514   HCT 35.8 (L) 12/08/2023 0448   HCT 43.6 05/15/2013 0514   PLT 203 12/08/2023 0448   PLT 171 05/15/2013 0514   MCV 96.0 12/08/2023 0448   MCV 92 05/15/2013 0514   MCH 32.4 12/08/2023 0448   MCHC 33.8 12/08/2023 0448   RDW 13.0 12/08/2023 0448   RDW 13.9 05/15/2013 0514   LYMPHSABS 0.6 (L) 12/03/2023 1024   LYMPHSABS 1.5 05/15/2013 0514   MONOABS 0.4 12/03/2023 1024   MONOABS 0.4 05/15/2013 0514   EOSABS 0.1 12/03/2023 1024   EOSABS 0.3 05/15/2013 0514   BASOSABS 0.0 12/03/2023 1024   BASOSABS 0.1 05/15/2013 0514      Latest Ref Rng & Units 12/08/2023    4:48 AM 12/06/2023    4:50 AM 12/05/2023    3:52 AM  BMP  Glucose 70 - 99 mg/dL 914  782  96    BUN 8 - 23 mg/dL 23  23  18    Creatinine 0.44 - 1.00 mg/dL 9.56  2.13  0.86   Sodium 135 - 145 mmol/L 136  138  140   Potassium 3.5 - 5.1 mmol/L 3.9  3.8  3.9   Chloride 98 - 111 mmol/L 105  104  105   CO2 22 - 32 mmol/L 25  25  26    Calcium  8.9 - 10.3 mg/dL 9.1  9.1  9.3     No results found.  Disposition Plan & Communication  Patient status: Inpatient  Admitted From: Home Planned disposition location: Home Anticipated discharge date: 4/25 pending HR control  Family Communication: none at bedside    Author: Ree Candy, DO [facility] 12/09/2023, 8:25 AM   Available by Epic secure chat 7AM-7PM. If 7PM-7AM, please contact night-coverage.  [facility] contact information found on [email or web address].

## 2023-12-09 NOTE — Plan of Care (Signed)
  Problem: Activity: Goal: Risk for activity intolerance will decrease Outcome: Progressing   Problem: Elimination: Goal: Will not experience complications related to bowel motility Outcome: Progressing   Problem: Skin Integrity: Goal: Risk for impaired skin integrity will decrease Outcome: Progressing

## 2023-12-09 NOTE — Progress Notes (Signed)
 Mobility Specialist - Progress Note   12/09/23 1519  Mobility  Activity Ambulated with assistance in hallway  Level of Assistance Contact guard assist, steadying assist  Assistive Device Front wheel walker  Distance Ambulated (ft) 30 ft  Activity Response Tolerated well  Mobility visit 1 Mobility     Pre-mobility: 97 HR During mobility: 142 HR  Post-mobility: 112 HR   Pt lying in bed upon arrival, utilizing RA. Pt agreeable to activity. MaxA + task initiation to complete bed mobility. STS with minA and ambulation in hallway with CGA. VC for staying close/inside RW. Distance limited this date d/t HR increasing to 142 bpm. Took several minutes after rest for HR to return to low 110s. Pt returned to bed with needs in reach. Sitter at bedside. RN notified.    Searcy Czech Mobility Specialist 12/09/23, 3:21 PM

## 2023-12-09 NOTE — Progress Notes (Signed)
 Rounding Note    Patient Name: Jill Snow Date of Encounter: 12/09/2023  [facility] Cardiologist: [facility] Trotti  Subjective   Laying in bed, no complaints Sitter at the bedside Denies shortness of breath or chest pain, no leg edema Telemetry reviewed heart rate 90-100 remains in atrial fib/flutter Set dose of diltiazem  ER 120 last night, had 180 mg this morning, blood pressure low but stable  Inpatient Medications    Scheduled Meds:  amiodarone   200 mg Oral BID   apixaban   5 mg Oral BID   atorvastatin   40 mg Oral Daily   Chlorhexidine  Gluconate Cloth  6 each Topical Daily   diltiazem   180 mg Oral BID   feeding supplement  237 mL Oral BID BM   pantoprazole   40 mg Oral Daily   QUEtiapine   50 mg Oral QHS   traZODone   50 mg Oral QHS   Continuous Infusions:  PRN Meds: bisacodyl , haloperidol  lactate, ibuprofen , LORazepam , ondansetron  (ZOFRAN ) IV, polyethylene glycol   Vital Signs    Vitals:   12/08/23 1957 12/09/23 0352 12/09/23 0500 12/09/23 0811  BP: 109/65 106/64  99/63  Pulse: (!) 120 98  (!) 110  Resp: 20 18  16   Temp: 98.3 F (36.8 C) 98.1 F (36.7 C)  98.1 F (36.7 C)  TempSrc:    Oral  SpO2: 95% 96%  95%  Weight:   69.4 kg   Height:        Intake/Output Summary (Last 24 hours) at 12/09/2023 1056 Last data filed at 12/09/2023 0800 Gross per 24 hour  Intake 360 ml  Output 1575 ml  Net -1215 ml      12/09/2023    5:00 AM 12/08/2023    5:08 AM 12/07/2023    5:00 AM  Last 3 Weights  Weight (lbs) 153 lb 153 lb 11.2 oz 152 lb 5.4 oz  Weight (kg) 69.4 kg 69.718 kg 69.1 kg      Telemetry    Atrial flutter rate 90-100- Personally Reviewed  ECG     - Personally Reviewed  Physical Exam   Constitutional:  oriented to person, place, and time. No distress.  HENT:  Head: Grossly normal Eyes:  no discharge. No scleral icterus.  Neck: No JVD, no carotid bruits  Cardiovascular: Irregularly irregular, no murmurs  appreciated Pulmonary/Chest: Clear to auscultation bilaterally, no wheezes or rails Abdominal: Soft.  no distension.  no tenderness.  Musculoskeletal: Normal range of motion Neurological:  normal muscle tone. Coordination normal. No atrophy Skin: Skin warm and dry Psychiatric: Confusion   Labs    High Sensitivity Troponin:   Recent Labs  Lab 11/29/23 2000  TROPONINIHS 2     Chemistry Recent Labs  Lab 12/02/23 2140 12/03/23 1024 12/04/23 0759 12/05/23 0352 12/06/23 0450 12/08/23 0448  NA  --  140   < > 140 138 136  K  --  4.2   < > 3.9 3.8 3.9  CL  --  106   < > 105 104 105  CO2  --  26   < > 26 25 25   GLUCOSE  --  128*   < > 96 106* 117*  BUN  --  19   < > 18 23 23   CREATININE  --  0.82   < > 0.83 1.02* 0.88  CALCIUM   --  9.1   < > 9.3 9.1 9.1  MG 2.2  --   --   --  2.3 2.3  PROT  --  6.4*  --   --   --   --   ALBUMIN  --  3.1*  --   --   --   --   AST  --  25  --   --   --   --   ALT  --  28  --   --   --   --   ALKPHOS  --  61  --   --   --   --   BILITOT  --  1.1  --   --   --   --   GFRNONAA  --  >60   < > >60 53* >60  ANIONGAP  --  8   < > 9 9 6    < > = values in this interval not displayed.    Lipids No results for input(s): "CHOL", "TRIG", "HDL", "LABVLDL", "LDLCALC", "CHOLHDL" in the last 168 hours.  Hematology Recent Labs  Lab 12/05/23 0352 12/06/23 0450 12/08/23 0448  WBC 7.0 6.0 6.2  RBC 4.29 3.93 3.73*  HGB 13.9 12.7 12.1  HCT 41.2 37.5 35.8*  MCV 96.0 95.4 96.0  MCH 32.4 32.3 32.4  MCHC 33.7 33.9 33.8  RDW 13.1 13.0 13.0  PLT 218 213 203   Thyroid   Recent Labs  Lab 12/05/23 0352  TSH 2.002    BNPNo results for input(s): "BNP", "PROBNP" in the last 168 hours.  DDimer No results for input(s): "DDIMER" in the last 168 hours.   Radiology    No results found.  Cardiac Studies  Echo June 2024 EF 60 to 65%   Patient Profile     Jill Snow is a 89 y.o. female with a hx of advanced dementia, hypertension, hyperlipidemia,  diastolic dysfunction, atrial fibrillation/flutter on Eliquis , and RBBB who is being seen 12/07/2023 for the evaluation of atrial fibrillation/flutter with RVR   Assessment & Plan    Atrial fibrillation/flutter Developing atrial flutter this admission, on Eliquis  5 twice daily consistently - Would pursue rate control on amiodarone  200 twice daily, diltiazem  ER 120 twice daily - Consider outpatient cardioversion if she remains in atrial flutter - Given underlying dementia we will try to avoid general anesthesia  UTI Urine positive multiple species, on antibiotics  Alzheimer's demantia Progressive disease over the past several years, family/daughter has been taking care of her at home, requiring a sitter  Hypertension Blood pressure well-controlled on current medication regiment  Hyperlipidemia On Lipitor   Diastolic dysfunction  Diastolic CHF Appears euvolemic  Case discussed with hospitalist service   For questions or updates, please contact [facility] Please consult [email or web address] for contact info under       Signed, Juanda Noon, MD, Ph.D [facility]

## 2023-12-10 ENCOUNTER — Other Ambulatory Visit: Payer: Self-pay | Admitting: Student in an Organized Health Care Education/Training Program

## 2023-12-10 DIAGNOSIS — I482 Chronic atrial fibrillation, unspecified: Secondary | ICD-10-CM | POA: Diagnosis not present

## 2023-12-10 DIAGNOSIS — R531 Weakness: Secondary | ICD-10-CM | POA: Diagnosis not present

## 2023-12-10 DIAGNOSIS — I480 Paroxysmal atrial fibrillation: Secondary | ICD-10-CM | POA: Diagnosis not present

## 2023-12-10 DIAGNOSIS — N390 Urinary tract infection, site not specified: Secondary | ICD-10-CM | POA: Diagnosis not present

## 2023-12-10 MED ORDER — DILTIAZEM HCL ER COATED BEADS 120 MG PO CP24: 120.0000 mg | ORAL_CAPSULE | Freq: Two times a day (BID) | ORAL | 0 refills | Status: DC

## 2023-12-10 MED ORDER — AMIODARONE HCL 200 MG PO TABS: 200.0000 mg | ORAL_TABLET | Freq: Two times a day (BID) | ORAL | 0 refills | Status: DC

## 2023-12-10 MED ORDER — QUETIAPINE FUMARATE 50 MG PO TABS: 50.0000 mg | ORAL_TABLET | Freq: Every day | ORAL | 1 refills | Status: DC

## 2023-12-10 NOTE — Plan of Care (Signed)

## 2023-12-10 NOTE — Discharge Summary (Signed)
 Physician Discharge Summary  Patient: Jill Snow:[id number] DOB: Feb 19, 1934   Code Status: Limited: Do not attempt resuscitation (DNR) -DNR-LIMITED -Do Not Intubate/DNI  Admit date: 11/29/2023 Discharge date: 12/10/2023 Disposition: Home health, PT, OT, nurse aid, and RN PCP: Devorah Fonder, MD  Recommendations for Outpatient Follow-up:  Follow up with PCP within 1-2 weeks Regarding general hospital follow up and preventative care Recommend BMP, CBC Cardiology  Regarding Afib monitoring   Discharge Diagnoses:  Principal Problem:   UTI (urinary tract infection) Active Problems:   HTN (hypertension)   Atrial fibrillation with rapid ventricular response (HCC)   Hypercholesterolemia   Chronic diastolic CHF (congestive heart failure) (HCC)   Late onset Alzheimer's dementia with behavioral disturbance (HCC)   Atrial flutter with rapid ventricular response (HCC)   Weakness   Chronic atrial fibrillation with RVR [facility])  Brief Hospital Course Summary: Jill Snow is a 89 y.o. female with medical history significant of advanced dementia, hypertension, hyperlipidemia, diastolic CHF, A-fib on Eliquis , right bundle blockage, who presents from home with weakness.   04/14: admitted to hospitalist for UTI. Completed 5 day antibiotic course. Denied urinary complaints.  Treatment, recovery has been complicated by agitation/sundowning requiring sedation and sitter. SNF was recommended but family prefers to take home.  4/18 developed tachycardia treated with fluids and metoprolol . On 4/20 telemetry showed intermittent afib with HR up to 130s. Cardiology was consulted and treated initially with cardizem  gtt. HR was difficult to manage on PO medications but ultimately did convert back to sinus rhythm and tolerated the new PO regimen prescribed by cardiology as listed below. She was also started on eliquis .  PT/OT/SLP evaluations were ongoing and continued to recommend SNF at dc  but per family preference, she was discharged home with [facility].   All other chronic conditions were treated with home medications.    Discharge Condition: Stable, improved Recommended discharge diet: Regular healthy diet  Consultations: Cardiology   Procedures/Studies: None   Allergies as of 12/10/2023       Reactions   Amoxicillin  Nausea Only   Augmentin  [amoxicillin -pot Clavulanate] Nausea And Vomiting   Codeine Anaphylaxis   Crestor  [rosuvastatin ] Other (See Comments)   Myalgias    Penicillins Other (See Comments)   Unknown reaction Tolerates cephalosporins         Medication List     STOP taking these medications    azithromycin  250 MG tablet Commonly known as: ZITHROMAX    benzonatate  100 MG capsule Commonly known as: TESSALON    furosemide  20 MG tablet Commonly known as: LASIX    traZODone  50 MG tablet Commonly known as: DESYREL        TAKE these medications    acetaminophen  650 MG CR tablet Commonly known as: TYLENOL  Take 1,300 mg by mouth every 8 (eight) hours as needed for pain.   amiodarone  200 MG tablet Commonly known as: PACERONE  Take 1 tablet (200 mg total) by mouth 2 (two) times daily. What changed: when to take this   apixaban  5 MG Tabs tablet Commonly known as: Eliquis  Take 1 tablet (5 mg total) by mouth 2 (two) times daily.   atorvastatin  40 MG tablet Commonly known as: LIPITOR  TAKE 1 TABLET BY MOUTH EVERY DAY   diltiazem  120 MG 24 hr capsule Commonly known as: CARDIZEM  CD Take 1 capsule (120 mg total) by mouth 2 (two) times daily.   montelukast  10 MG tablet Commonly known as: SINGULAIR  TAKE 1 TABLET BY MOUTH EVERY DAY   omeprazole  20 MG capsule Commonly  known as: PRILOSEC Take 1 capsule (20 mg total) by mouth daily.   polyethylene glycol 17 g packet Commonly known as: MIRALAX  / GLYCOLAX  Take 17 g by mouth daily as needed for mild constipation.   potassium chloride  SA 20 MEQ tablet Commonly known as: Klor-Con  M20 Take 1  tablet (20 mEq total) by mouth daily. May take an extra tablet (20 Meq) daily as needed with extra lasix .   QUEtiapine  50 MG tablet Commonly known as: SEROQUEL  Take 1 tablet (50 mg total) by mouth at bedtime.   VITAMIN D -3 PO Take 1 capsule by mouth at bedtime.   VITAMIN E  PO Take 1 capsule by mouth daily.        Contact information for follow-up providers     Gollan, Deadra Everts, MD. Schedule an appointment as soon as possible for a visit in 2 week(s).   Specialty: Cardiology Contact information: [address] [place] [address] [phone]              Contact information for after-discharge care     Destination     [facility] .   Service: Skilled Nursing Contact information: [address] [place]  [phone]                     Subjective   Pt reports feeling well. Denies CP, SOB. Did well with PT today.   All questions and concerns were addressed at time of discharge.  Objective  Blood pressure (!) 107/53, pulse (!) 51, temperature 97.6 F (36.4 C), resp. rate 16, height 5\' 6"  (1.676 m), weight 71.9 kg, SpO2 92%.   General: Pt is alert, awake, not in acute distress. Oriented only to self. Cardiovascular: RRR, S1/S2 +, no rubs, no gallops Respiratory: CTA bilaterally, no wheezing, no rhonchi Abdominal: Soft, NT, ND, bowel sounds + Extremities: no edema, no cyanosis  The results of significant diagnostics from this hospitalization (including imaging, microbiology, ancillary and laboratory) are listed below for reference.   Imaging studies: DG Chest Port 1 View Result Date: 12/03/2023 CLINICAL DATA:  Tachycardia EXAM: PORTABLE CHEST 1 VIEW COMPARISON:  Chest radiograph dated 11/29/2023 FINDINGS: Low lung volumes with bronchovascular crowding. Again seen is large hiatal hernia within the left lower thorax. Increased dense left retrocardiac opacity. No pleural effusion or pneumothorax.  Similar cardiomediastinal silhouette. No acute osseous abnormality. IMPRESSION: 1. Increased dense left retrocardiac opacity, likely atelectasis. Aspiration or pneumonia can be considered in the appropriate clinical setting. 2. Large hiatal hernia. Electronically Signed   By: Limin  Xu M.D.   On: 12/03/2023 14:28   CT Head Wo Contrast Result Date: 11/29/2023 CLINICAL DATA:  Altered mental status EXAM: CT HEAD WITHOUT CONTRAST TECHNIQUE: Contiguous axial images were obtained from the base of the skull through the vertex without intravenous contrast. RADIATION DOSE REDUCTION: This exam was performed according to the departmental dose-optimization program which includes automated exposure control, adjustment of the mA and/or kV according to patient size and/or use of iterative reconstruction technique. COMPARISON:  02/10/2023 FINDINGS: Brain: No evidence of acute infarction, hemorrhage, hydrocephalus, extra-axial collection or mass lesion/mass effect. Mild atrophic and chronic white matter ischemic changes are noted. Vascular: No hyperdense vessel or unexpected calcification. Skull: Normal. Negative for fracture or focal lesion. Sinuses/Orbits: No acute finding. Other: None. IMPRESSION: Chronic atrophic and ischemic changes. Electronically Signed   By: Violeta Grey M.D.   On: 11/29/2023 22:08   DG Chest 1 View Result Date: 11/29/2023 CLINICAL  DATA:  Altered mental status EXAM: CHEST  1 VIEW COMPARISON:  Chest x-ray 02/10/2023 FINDINGS: Heart is enlarged. There is a small left pleural effusion and minimal left basilar patchy opacities. Right lung is clear. No pneumothorax or acute fracture. IMPRESSION: 1. Small left pleural effusion and minimal left basilar patchy opacities, likely atelectasis. 2. Cardiomegaly. Electronically Signed   By: Tyron Gallon M.D.   On: 11/29/2023 21:49    Labs: Basic Metabolic Panel: Recent Labs  Lab 12/04/23 0759 12/05/23 0352 12/06/23 0450 12/08/23 0448  NA 138 140 138 136   K 3.6 3.9 3.8 3.9  CL 105 105 104 105  CO2 25 26 25 25   GLUCOSE 106* 96 106* 117*  BUN 17 18 23 23   CREATININE 0.80 0.83 1.02* 0.88  CALCIUM  8.9 9.3 9.1 9.1  MG  --   --  2.3 2.3   CBC: Recent Labs  Lab 12/04/23 0759 12/05/23 0352 12/06/23 0450 12/08/23 0448  WBC 7.6 7.0 6.0 6.2  HGB 13.3 13.9 12.7 12.1  HCT 39.2 41.2 37.5 35.8*  MCV 95.6 96.0 95.4 96.0  PLT 213 218 213 203   Microbiology: Results for orders placed or performed during the hospital encounter of 11/29/23  Culture, blood (routine x 2)     Status: None   Collection Time: 11/29/23  8:30 PM   Specimen: BLOOD  Result Value Ref Range Status   Specimen Description BLOOD BLOOD LEFT FOREARM  Final   Special Requests   Final    BOTTLES DRAWN AEROBIC AND ANAEROBIC Blood Culture adequate volume   Culture   Final    NO GROWTH 5 DAYS Performed at [facility], [address] [place], [place] [address]    Report Status 12/04/2023 FINAL  Final  Culture, blood (routine x 2)     Status: None   Collection Time: 11/29/23  8:51 PM   Specimen: BLOOD  Result Value Ref Range Status   Specimen Description BLOOD BLOOD LEFT HAND  Final   Special Requests   Final    BOTTLES DRAWN AEROBIC AND ANAEROBIC Blood Culture results may not be optimal due to an inadequate volume of blood received in culture bottles   Culture   Final    NO GROWTH 5 DAYS Performed at [facility], [address], [place], [place] [address]    Report Status 12/04/2023 FINAL  Final  Urine Culture     Status: Abnormal   Collection Time: 11/29/23 10:30 PM   Specimen: Urine, Random  Result Value Ref Range Status   Specimen Description   Final    URINE, RANDOM Performed at [facility], [address] [place], [place] [address]    Special Requests   Final    NONE Reflexed from [id number] Performed at [facility], [address] [place], [place] [address]    Culture MULTIPLE SPECIES PRESENT, SUGGEST RECOLLECTION  (A)  Final   Report Status 12/01/2023 FINAL  Final    Time coordinating discharge: Over 30 minutes  Ree Candy, MD  [facility] 12/10/2023, 1:03 PM

## 2023-12-10 NOTE — Discharge Instructions (Signed)
 Your antibiotic course has been completed for your urinary tract infection.   Cardiology has changed several medication to get good control of your blood pressure and heart rate so please review your medication list carefully.   Follow up with your PCP and cardiology as instructed

## 2023-12-10 NOTE — TOC Progression Note (Signed)
 Transition of Care [facility]) - Progression Note    Patient Details  Name: Jill Snow MRN: [id number] Date of Birth: Jan 10, 1934  Transition of Care [facility]) CM/SW Contact  [facility]aird Bombard, RN Phone Number: 12/10/2023, 9:52 AM  Clinical Narrative:    TOC continuing to follow patient's progress throughout discharge planning.        Expected Discharge Plan and Services                                               Social Determinants of Health (SDOH) Interventions SDOH Screenings   Food Insecurity: No Food Insecurity (11/30/2023)  Housing: Low Risk  (11/30/2023)  Transportation Needs: No Transportation Needs (11/30/2023)  Utilities: Not At Risk (11/30/2023)  Depression (PHQ2-9): Low Risk  (11/10/2022)  Financial Resource Strain: Low Risk  (06/02/2021)  Physical Activity: Insufficiently Active (06/02/2021)  Social Connections: Socially Isolated (11/30/2023)  Stress: No Stress Concern Present (06/02/2021)  Tobacco Use: Low Risk  (11/30/2023)    Readmission Risk Interventions     No data to display

## 2023-12-10 NOTE — Progress Notes (Signed)
 Mobility Specialist - Progress Note   12/10/23 1133  Mobility  Activity Ambulated with assistance in hallway  Level of Assistance Contact guard assist, steadying assist  Assistive Device Front wheel walker  Distance Ambulated (ft) 100 ft  Activity Response Tolerated well  Mobility visit 1 Mobility    Pre-mobility: 66 HR During mobility: 71 HR Post mobility: 67 HR  Pt lying in bed upon arrival, utilizing RA. Pt agreeable to activity. Completed bed mobility with maxA. STS with minA and ambulation in hallway with CGA. Posterior lean upon initial stand, requiring assist for balance. VC for maintaining hand placement during ambulation. Pt easily distracted in busy hallway and requires persistent redirection. Pt noted to be limping this date, but unable to specify if she was experiencing pain d/t cognitive deficits. HR WFL throughout session. Pt returned to bed with alarm set, needs in reach.    Searcy Czech Mobility Specialist 12/10/23, 11:36 AM

## 2023-12-10 NOTE — TOC Progression Note (Signed)
 Transition of Care [facility]) - Progression Note    Patient Details  Name: Jill Snow MRN: [id number] Date of Birth: 12-Mar-1934  Transition of Care [facility]) CM/SW Contact  Baird Bombard, RN Phone Number: 12/10/2023, 1:08 PM  Clinical Narrative:    Spoke with patient's son, [name] to advised of discharge today. He stated he would be able to come this evening after work around 6pm.   Randel Buss from [facility] notified of discharge.   TOC signing off.          Expected Discharge Plan and Services         Expected Discharge Date: 12/10/23                                     Social Determinants of Health (SDOH) Interventions SDOH Screenings   Food Insecurity: No Food Insecurity (11/30/2023)  Housing: Low Risk  (11/30/2023)  Transportation Needs: No Transportation Needs (11/30/2023)  Utilities: Not At Risk (11/30/2023)  Depression (PHQ2-9): Low Risk  (11/10/2022)  Financial Resource Strain: Low Risk  (06/02/2021)  Physical Activity: Insufficiently Active (06/02/2021)  Social Connections: Socially Isolated (11/30/2023)  Stress: No Stress Concern Present (06/02/2021)  Tobacco Use: Low Risk  (11/30/2023)    Readmission Risk Interventions     No data to display

## 2023-12-10 NOTE — Progress Notes (Signed)
 Rounding Note    Patient Name: Jill Snow Date of Encounter: 12/10/2023  [facility] Cardiologist: None   Subjective   Patient sen on AM rounds. Sitter remains at bedside. Converted to sinus around 2147 last evening.  Denies any shortness of breath or chest pain.  Inpatient Medications    Scheduled Meds:  amiodarone   200 mg Oral BID   apixaban   5 mg Oral BID   atorvastatin   40 mg Oral Daily   Chlorhexidine  Gluconate Cloth  6 each Topical Daily   digoxin   0.125 mg Oral Daily   diltiazem   120 mg Oral BID   feeding supplement  237 mL Oral BID BM   pantoprazole   40 mg Oral Daily   QUEtiapine   50 mg Oral QHS   traZODone   50 mg Oral QHS   Continuous Infusions:  PRN Meds: bisacodyl , haloperidol  lactate, ibuprofen , LORazepam , ondansetron  (ZOFRAN ) IV, polyethylene glycol   Vital Signs    Vitals:   12/09/23 1958 12/10/23 0419 12/10/23 0421 12/10/23 0858  BP: (!) 111/59 (!) 100/53  (!) 114/52  Pulse: (!) 110 (!) 56  (!) 57  Resp: 16 20  16   Temp: 98.8 F (37.1 C) 97.7 F (36.5 C)  97.6 F (36.4 C)  TempSrc: Oral Oral    SpO2: 98% 96%  92%  Weight:   71.9 kg   Height:        Intake/Output Summary (Last 24 hours) at 12/10/2023 0915 Last data filed at 12/09/2023 1742 Gross per 24 hour  Intake 720 ml  Output 325 ml  Net 395 ml      12/10/2023    4:21 AM 12/09/2023    5:00 AM 12/08/2023    5:08 AM  Last 3 Weights  Weight (lbs) 158 lb 8 oz 153 lb 153 lb 11.2 oz  Weight (kg) 71.895 kg 69.4 kg 69.718 kg      Telemetry    Converted to sinus rhythm with PACs with rates in the 50s and 60s around 21 27-20 147 last evening- Personally Reviewed  ECG    No new tracings- Personally Reviewed  Physical Exam   GEN: No acute distress.   Neck: No JVD Cardiac: RRR, no murmurs, rubs, or gallops.  Respiratory: Clear to auscultation bilaterally. GI: Soft, nontender, non-distended  MS: No edema; No deformity. Neuro:  Nonfocal  Psych: Normal affect    Labs    High Sensitivity Troponin:   Recent Labs  Lab 11/29/23 2000  TROPONINIHS 2     Chemistry Recent Labs  Lab 12/03/23 1024 12/04/23 0759 12/05/23 0352 12/06/23 0450 12/08/23 0448  NA 140   < > 140 138 136  K 4.2   < > 3.9 3.8 3.9  CL 106   < > 105 104 105  CO2 26   < > 26 25 25   GLUCOSE 128*   < > 96 106* 117*  BUN 19   < > 18 23 23   CREATININE 0.82   < > 0.83 1.02* 0.88  CALCIUM  9.1   < > 9.3 9.1 9.1  MG  --   --   --  2.3 2.3  PROT 6.4*  --   --   --   --   ALBUMIN  3.1*  --   --   --   --   AST 25  --   --   --   --   ALT 28  --   --   --   --  ALKPHOS 61  --   --   --   --   BILITOT 1.1  --   --   --   --   GFRNONAA >60   < > >60 53* >60  ANIONGAP 8   < > 9 9 6    < > = values in this interval not displayed.    Lipids No results for input(s): "CHOL", "TRIG", "HDL", "LABVLDL", "LDLCALC", "CHOLHDL" in the last 168 hours.  Hematology Recent Labs  Lab 12/05/23 0352 12/06/23 0450 12/08/23 0448  WBC 7.0 6.0 6.2  RBC 4.29 3.93 3.73*  HGB 13.9 12.7 12.1  HCT 41.2 37.5 35.8*  MCV 96.0 95.4 96.0  MCH 32.4 32.3 32.4  MCHC 33.7 33.9 33.8  RDW 13.1 13.0 13.0  PLT 218 213 203   Thyroid   Recent Labs  Lab 12/05/23 0352  TSH 2.002    BNPNo results for input(s): "BNP", "PROBNP" in the last 168 hours.  DDimer No results for input(s): "DDIMER" in the last 168 hours.   Radiology    No results found.  Cardiac Studies   2D echo 02/11/2023 1. Left ventricular ejection fraction, by estimation, is 60 to 65%. The  left ventricle has normal function. The left ventricle has no regional  wall motion abnormalities. Left ventricular diastolic parameters are  consistent with Grade II diastolic  dysfunction (pseudonormalization).   2. Right ventricular systolic function is normal. The right ventricular  size is normal. Tricuspid regurgitation signal is inadequate for assessing  PA pressure.   3. Left atrial size was moderately dilated.   4. The mitral valve is  degenerative. Mild to moderate mitral valve  regurgitation.   5. The aortic valve is tricuspid. Aortic valve regurgitation is not  visualized. Aortic valve sclerosis/calcification is present, without any  evidence of aortic stenosis.    Patient Profile     89 y.o. female with a past medical history of advanced dementia, hypertension, hyperlipidemia, diastolic dysfunction, atrial fibrillation/flutter on apixaban , right bundle branch block, who has been seen and evaluated for atrial fibrillation/flutter with RVR.  Assessment & Plan    Atrial fibrillation/atrial flutter -Developed atrial flutter on admission -Continued on amiodarone  200 mg twice daily diltiazem  120 mg twice daily -Converted to sinus rhythm with PACs last evening around 930 -Has been bradycardic throughout the morning digoxin  discontinued -Continue on telemetry monitoring -Continue on apixaban  5 mg twice daily  UTI -Urine positive -Continue antibiotics -Ongoing management per IM  Alzheimer's dementia -Progressive disease over the last several years -Sitter remains at bedside  Hypertension -Blood pressure 107/53 -Continued on diltiazem , amiodarone  -Vital signs per unit protocol  Hyperlipidemia -Continued on atorvastatin  40 mg daily  Diastolic dysfunction -With last echocardiogram revealed an LVEF of 60 to 65%, no RWMA, G2 DD, normal right ventricular systolic function and size.  Mild to moderate MR. - -3.8 L output since admission -Inaccurate I's and O's have been documented -Appears to be euvolemic on exam      For questions or updates, please contact [facility] Please consult [email or web address] for contact info under        Signed, Nimsi Males, NP  12/10/2023, 9:15 AM

## 2023-12-10 NOTE — Plan of Care (Signed)
  Problem: Clinical Measurements: Goal: Diagnostic test results will improve Outcome: Progressing Goal: Respiratory complications will improve Outcome: Progressing   Problem: Coping: Goal: Level of anxiety will decrease Outcome: Progressing   Problem: Elimination: Goal: Will not experience complications related to bowel motility Outcome: Progressing

## 2023-12-11 ENCOUNTER — Encounter: Payer: Self-pay | Admitting: Cardiovascular Disease

## 2023-12-17 ENCOUNTER — Inpatient Hospital Stay
Admission: EM | Admit: 2023-12-17 | Discharge: 2023-12-22 | DRG: 689 | Disposition: A | Discharge status: Home Health | Attending: Internal Medicine | Admitting: Internal Medicine

## 2023-12-17 ENCOUNTER — Emergency Department

## 2023-12-17 DIAGNOSIS — Z833 Family history of diabetes mellitus: Secondary | ICD-10-CM

## 2023-12-17 DIAGNOSIS — I482 Chronic atrial fibrillation, unspecified: Secondary | ICD-10-CM | POA: Diagnosis present

## 2023-12-17 DIAGNOSIS — Z1152 Encounter for screening for COVID-19: Secondary | ICD-10-CM

## 2023-12-17 DIAGNOSIS — F05 Delirium due to known physiological condition: Secondary | ICD-10-CM | POA: Diagnosis present

## 2023-12-17 DIAGNOSIS — Z66 Do not resuscitate: Secondary | ICD-10-CM | POA: Diagnosis present

## 2023-12-17 DIAGNOSIS — I480 Paroxysmal atrial fibrillation: Secondary | ICD-10-CM | POA: Diagnosis present

## 2023-12-17 DIAGNOSIS — I340 Nonrheumatic mitral (valve) insufficiency: Secondary | ICD-10-CM | POA: Diagnosis present

## 2023-12-17 DIAGNOSIS — I110 Hypertensive heart disease with heart failure: Secondary | ICD-10-CM | POA: Diagnosis present

## 2023-12-17 DIAGNOSIS — N390 Urinary tract infection, site not specified: Secondary | ICD-10-CM | POA: Diagnosis present

## 2023-12-17 DIAGNOSIS — Z8261 Family history of arthritis: Secondary | ICD-10-CM

## 2023-12-17 DIAGNOSIS — I5032 Chronic diastolic (congestive) heart failure: Secondary | ICD-10-CM | POA: Diagnosis present

## 2023-12-17 DIAGNOSIS — B9620 Unspecified Escherichia coli [E. coli] as the cause of diseases classified elsewhere: Secondary | ICD-10-CM | POA: Diagnosis present

## 2023-12-17 DIAGNOSIS — J449 Chronic obstructive pulmonary disease, unspecified: Secondary | ICD-10-CM | POA: Diagnosis present

## 2023-12-17 DIAGNOSIS — G9341 Metabolic encephalopathy: Secondary | ICD-10-CM | POA: Diagnosis present

## 2023-12-17 DIAGNOSIS — I5033 Acute on chronic diastolic (congestive) heart failure: Secondary | ICD-10-CM | POA: Diagnosis present

## 2023-12-17 DIAGNOSIS — Z83438 Family history of other disorder of lipoprotein metabolism and other lipidemia: Secondary | ICD-10-CM

## 2023-12-17 DIAGNOSIS — Z1623 Resistance to quinolones and fluoroquinolones: Secondary | ICD-10-CM | POA: Diagnosis present

## 2023-12-17 DIAGNOSIS — R531 Weakness: Secondary | ICD-10-CM | POA: Diagnosis present

## 2023-12-17 DIAGNOSIS — R262 Difficulty in walking, not elsewhere classified: Secondary | ICD-10-CM

## 2023-12-17 DIAGNOSIS — Z823 Family history of stroke: Secondary | ICD-10-CM

## 2023-12-17 DIAGNOSIS — Z8249 Family history of ischemic heart disease and other diseases of the circulatory system: Secondary | ICD-10-CM

## 2023-12-17 DIAGNOSIS — Z880 Allergy status to penicillin: Secondary | ICD-10-CM

## 2023-12-17 DIAGNOSIS — N200 Calculus of kidney: Secondary | ICD-10-CM | POA: Diagnosis present

## 2023-12-17 DIAGNOSIS — Z90710 Acquired absence of both cervix and uterus: Secondary | ICD-10-CM | POA: Diagnosis not present

## 2023-12-17 DIAGNOSIS — F02818 Dementia in other diseases classified elsewhere, unspecified severity, with other behavioral disturbance: Secondary | ICD-10-CM | POA: Diagnosis present

## 2023-12-17 DIAGNOSIS — I2510 Atherosclerotic heart disease of native coronary artery without angina pectoris: Secondary | ICD-10-CM | POA: Diagnosis present

## 2023-12-17 DIAGNOSIS — Z7901 Long term (current) use of anticoagulants: Secondary | ICD-10-CM | POA: Diagnosis not present

## 2023-12-17 DIAGNOSIS — N3090 Cystitis, unspecified without hematuria: Secondary | ICD-10-CM | POA: Diagnosis present

## 2023-12-17 DIAGNOSIS — N3000 Acute cystitis without hematuria: Secondary | ICD-10-CM | POA: Diagnosis not present

## 2023-12-17 DIAGNOSIS — G301 Alzheimer's disease with late onset: Secondary | ICD-10-CM | POA: Diagnosis present

## 2023-12-17 DIAGNOSIS — N179 Acute kidney failure, unspecified: Principal | ICD-10-CM | POA: Diagnosis present

## 2023-12-17 DIAGNOSIS — E7800 Pure hypercholesterolemia, unspecified: Secondary | ICD-10-CM | POA: Diagnosis present

## 2023-12-17 DIAGNOSIS — Z87892 Personal history of anaphylaxis: Secondary | ICD-10-CM

## 2023-12-17 DIAGNOSIS — Z885 Allergy status to narcotic agent status: Secondary | ICD-10-CM

## 2023-12-17 DIAGNOSIS — F0390 Unspecified dementia without behavioral disturbance: Secondary | ICD-10-CM

## 2023-12-17 DIAGNOSIS — Z87440 Personal history of urinary (tract) infections: Secondary | ICD-10-CM

## 2023-12-17 HISTORY — DX: Unspecified dementia, unspecified severity, without behavioral disturbance, psychotic disturbance, mood disturbance, and anxiety: F03.90

## 2023-12-17 LAB — COMPREHENSIVE METABOLIC PANEL WITH GFR
ALT: 16 U/L (ref 0–44)
AST: 17 U/L (ref 15–41)
Albumin: 3.3 g/dL — ABNORMAL LOW (ref 3.5–5.0)
Alkaline Phosphatase: 74 U/L (ref 38–126)
Anion gap: 8 (ref 5–15)
BUN: 17 mg/dL (ref 8–23)
CO2: 23 mmol/L (ref 22–32)
Calcium: 9.1 mg/dL (ref 8.9–10.3)
Chloride: 109 mmol/L (ref 98–111)
Creatinine, Ser: 1.22 mg/dL — ABNORMAL HIGH (ref 0.44–1.00)
GFR, Estimated: 43 mL/min — ABNORMAL LOW (ref >60)
Glucose, Bld: 96 mg/dL (ref 70–99)
Potassium: 4.7 mmol/L (ref 3.5–5.1)
Sodium: 140 mmol/L (ref 135–145)
Total Bilirubin: 1.4 mg/dL — ABNORMAL HIGH (ref 0.0–1.2)
Total Protein: 6.9 g/dL (ref 6.5–8.1)

## 2023-12-17 LAB — URINALYSIS, ROUTINE W REFLEX MICROSCOPIC
Bilirubin Urine: NEGATIVE
Glucose, UA: NEGATIVE mg/dL
Hgb urine dipstick: NEGATIVE
Ketones, ur: NEGATIVE mg/dL
Nitrite: NEGATIVE
Protein, ur: NEGATIVE mg/dL
Specific Gravity, Urine: 1.019 (ref 1.005–1.030)
WBC, UA: >50 WBC/hpf (ref 0–5)
pH: 5 (ref 5.0–8.0)

## 2023-12-17 LAB — TROPONIN I (HIGH SENSITIVITY)
Troponin I (High Sensitivity): 6 ng/L (ref <18)
Troponin I (High Sensitivity): 6 ng/L (ref <18)

## 2023-12-17 LAB — RESP PANEL BY RT-PCR (RSV, FLU A&B, COVID)  RVPGX2
Influenza A by PCR: NEGATIVE
Influenza B by PCR: NEGATIVE
Resp Syncytial Virus by PCR: NEGATIVE
SARS Coronavirus 2 by RT PCR: NEGATIVE

## 2023-12-17 LAB — CBC WITH DIFFERENTIAL/PLATELET
Abs Immature Granulocytes: 0.01 10*3/uL (ref 0.00–0.07)
Basophils Absolute: 0 10*3/uL (ref 0.0–0.1)
Basophils Relative: 1 %
Eosinophils Absolute: 0.2 10*3/uL (ref 0.0–0.5)
Eosinophils Relative: 4 %
HCT: 34.1 % — ABNORMAL LOW (ref 36.0–46.0)
Hemoglobin: 10.9 g/dL — ABNORMAL LOW (ref 12.0–15.0)
Immature Granulocytes: 0 %
Lymphocytes Relative: 12 %
Lymphs Abs: 0.6 10*3/uL — ABNORMAL LOW (ref 0.7–4.0)
MCH: 32.1 pg (ref 26.0–34.0)
MCHC: 32 g/dL (ref 30.0–36.0)
MCV: 100.3 fL — ABNORMAL HIGH (ref 80.0–100.0)
Monocytes Absolute: 0.6 10*3/uL (ref 0.1–1.0)
Monocytes Relative: 11 %
Neutro Abs: 3.5 10*3/uL (ref 1.7–7.7)
Neutrophils Relative %: 72 %
Platelets: 234 10*3/uL (ref 150–400)
RBC: 3.4 MIL/uL — ABNORMAL LOW (ref 3.87–5.11)
RDW: 13.1 % (ref 11.5–15.5)
WBC: 4.9 10*3/uL (ref 4.0–10.5)
nRBC: 0 % (ref 0.0–0.2)

## 2023-12-17 LAB — AMMONIA: Ammonia: 21 umol/L (ref 9–35)

## 2023-12-17 LAB — TSH: TSH: 0.954 u[IU]/mL (ref 0.350–4.500)

## 2023-12-17 LAB — CBG MONITORING, ED: Glucose-Capillary: 103 mg/dL — ABNORMAL HIGH (ref 70–99)

## 2023-12-17 LAB — MAGNESIUM: Magnesium: 2.4 mg/dL (ref 1.7–2.4)

## 2023-12-17 MED ORDER — ATORVASTATIN CALCIUM 20 MG PO TABS
40.0000 mg | ORAL_TABLET | Freq: Every day | ORAL | Status: DC
  Administered 2023-12-18 – 2023-12-22 (×5): 40 mg via ORAL
  Filled 2023-12-17 (×5): qty 2

## 2023-12-17 MED ORDER — MONTELUKAST SODIUM 10 MG PO TABS
10.0000 mg | ORAL_TABLET | Freq: Every day | ORAL | Status: DC
  Administered 2023-12-18 – 2023-12-22 (×5): 10 mg via ORAL
  Filled 2023-12-17 (×5): qty 1

## 2023-12-17 MED ORDER — ONDANSETRON HCL 4 MG/2ML IJ SOLN: 4.0000 mg | Freq: Four times a day (QID) | INTRAMUSCULAR | Status: DC | PRN

## 2023-12-17 MED ORDER — ONDANSETRON HCL 4 MG PO TABS: 4.0000 mg | ORAL_TABLET | Freq: Four times a day (QID) | ORAL | Status: DC | PRN

## 2023-12-17 MED ORDER — OLANZAPINE 10 MG IM SOLR
2.5000 mg | Freq: Four times a day (QID) | INTRAMUSCULAR | Status: DC | PRN
  Administered 2023-12-17 – 2023-12-20 (×3): 2.5 mg via INTRAMUSCULAR
  Filled 2023-12-17 (×4): qty 10

## 2023-12-17 MED ORDER — SODIUM CHLORIDE 0.9 % IV BOLUS
500.0000 mL | Freq: Once | INTRAVENOUS | Status: AC
  Administered 2023-12-17: 500 mL via INTRAVENOUS

## 2023-12-17 MED ORDER — PANTOPRAZOLE SODIUM 40 MG PO TBEC
40.0000 mg | DELAYED_RELEASE_TABLET | Freq: Every day | ORAL | Status: DC
  Administered 2023-12-18 – 2023-12-22 (×5): 40 mg via ORAL
  Filled 2023-12-17 (×5): qty 1

## 2023-12-17 MED ORDER — SODIUM CHLORIDE 0.9 % IV SOLN
1.0000 g | Freq: Once | INTRAVENOUS | Status: AC
  Administered 2023-12-17: 1 g via INTRAVENOUS
  Filled 2023-12-17: qty 10

## 2023-12-17 MED ORDER — DILTIAZEM HCL ER COATED BEADS 120 MG PO CP24: 120.0000 mg | ORAL_CAPSULE | Freq: Two times a day (BID) | ORAL | Status: DC

## 2023-12-17 MED ORDER — POLYETHYLENE GLYCOL 3350 17 G PO PACK
17.0000 g | PACK | Freq: Every day | ORAL | Status: DC | PRN
  Administered 2023-12-22 (×2): 17 g via ORAL
  Filled 2023-12-17 (×2): qty 1

## 2023-12-17 MED ORDER — ACETAMINOPHEN 500 MG PO TABS
1000.0000 mg | ORAL_TABLET | Freq: Three times a day (TID) | ORAL | Status: DC | PRN
  Administered 2023-12-17 – 2023-12-18 (×2): 1000 mg via ORAL
  Filled 2023-12-17 (×3): qty 2

## 2023-12-17 MED ORDER — POTASSIUM CHLORIDE CRYS ER 20 MEQ PO TBCR
20.0000 meq | EXTENDED_RELEASE_TABLET | Freq: Every day | ORAL | Status: DC
  Administered 2023-12-18 – 2023-12-22 (×5): 20 meq via ORAL
  Filled 2023-12-17 (×5): qty 1

## 2023-12-17 MED ORDER — AMIODARONE HCL 200 MG PO TABS
200.0000 mg | ORAL_TABLET | Freq: Two times a day (BID) | ORAL | Status: DC
  Administered 2023-12-17 – 2023-12-22 (×9): 200 mg via ORAL
  Filled 2023-12-17 (×10): qty 1

## 2023-12-17 MED ORDER — APIXABAN 5 MG PO TABS
5.0000 mg | ORAL_TABLET | Freq: Two times a day (BID) | ORAL | Status: DC
  Administered 2023-12-17 – 2023-12-22 (×10): 5 mg via ORAL
  Filled 2023-12-17 (×10): qty 1

## 2023-12-17 MED ORDER — QUETIAPINE FUMARATE 25 MG PO TABS
50.0000 mg | ORAL_TABLET | Freq: Every day | ORAL | Status: DC
  Administered 2023-12-18 – 2023-12-21 (×5): 50 mg via ORAL
  Filled 2023-12-17 (×5): qty 2

## 2023-12-17 MED ORDER — SODIUM CHLORIDE 0.9 % IV SOLN
2.0000 g | INTRAVENOUS | Status: DC
  Administered 2023-12-18 – 2023-12-21 (×4): 2 g via INTRAVENOUS
  Filled 2023-12-17 (×4): qty 20

## 2023-12-17 MED ORDER — FUROSEMIDE 10 MG/ML IJ SOLN
20.0000 mg | Freq: Two times a day (BID) | INTRAMUSCULAR | Status: DC
  Administered 2023-12-17: 20 mg via INTRAVENOUS
  Filled 2023-12-17: qty 4

## 2023-12-17 NOTE — ED Provider Notes (Signed)
 [facility] Provider Note    Event Date/Time   First MD Initiated Contact with Patient 12/17/23 0800     (approximate)   History   Altered Mental Status   HPI  Jill Snow is a 89 y.o. female with history of dementia, chronic A-fib, hypertension who comes in with concerns for altered mental status.  Patient is on Eliquis .  She comes in with concerns for weakness.  I reviewed a hospital admission from 4/14 and patient was treated for a UTI.  SNF was recommended but family preferred to take her home.  Patient presents today due to inability to walk and increasing weakness.  Patient felt warm to touch but no obvious temperature.  No known falls.  Unclear how long she has not been able to to walk for.  Physical Exam   Triage Vital Signs: ED Triage Vitals  Encounter Vitals Group     BP 12/17/23 0801 117/65     Systolic BP Percentile --      Diastolic BP Percentile --      Pulse Rate 12/17/23 0801 67     Resp 12/17/23 0801 16     Temp --      Temp src --      SpO2 12/17/23 0801 96 %     Weight 12/17/23 0804 163 lb 5.8 oz (74.1 kg)     Height --      Head Circumference --      Peak Flow --      Pain Score --      Pain Loc --      Pain Education --      Exclude from Growth Chart --     Most recent vital signs: Vitals:   12/17/23 0801  BP: 117/65  Pulse: 67  Resp: 16  SpO2: 96%     General: Awake, no distress.  CV:  Good peripheral perfusion.  Resp:  Normal effort.  Abd:  Maybe slightly distended but does not appear obviously tender to palpation. Other:  Able to wiggle bilateraly toes, squeeze hands.  No obvious cranial deficits   ED Results / Procedures / Treatments   Labs (all labs ordered are listed, but only abnormal results are displayed) Labs Reviewed - No data to display   EKG  My interpretation of EKG:  Sinus rate of 70 without any ST elevation or T wave inversions, right bundle branch block  RADIOLOGY I have  reviewed the  ct personally and interpreted no intracranial hemorrhage   PROCEDURES:  Critical Care performed: No  Procedures   MEDICATIONS ORDERED IN ED: Medications  cefTRIAXone  (ROCEPHIN ) 1 g in sodium chloride  0.9 % 100 mL IVPB (0 g Intravenous Stopped 12/17/23 1020)  sodium chloride  0.9 % bolus 500 mL (500 mLs Intravenous New Bag/Given 12/17/23 1019)     IMPRESSION / MDM / ASSESSMENT AND PLAN / ED COURSE  I reviewed the triage vital signs and the nursing notes.   Patient's presentation is most consistent with acute presentation with potential threat to life or bodily function.   Workup workup to evaluate for Electra abnormalities, AKI, ACS.  CT imaging given patient is on Eliquis  to make sure no intracranial hemorrhage, cervical fracture, intra-abdominal infection.  Patient's glucose is normal.  CBC shows slightly lower hemoglobin but white count is normal urine does look consistent with UTI  Attempted to call family- no answer.  Patient does have slight AKI.  Her urine looks consistent with UTI although 2  weeks ago her urine culture just showed mixed flora some unsure if this is actually a real UTI or not.  I have attempted to call family and no one is picked up no one is in the room.  I am concerned with patient's ability to go home and appears at before they wanted her to be in a SNF and they tried to take her home.  Given her difficulties in ambulating and confusion I will discuss with the hospital team for admission.  The patient is on the cardiac monitor to evaluate for evidence of arrhythmia and/or significant heart rate changes.      FINAL CLINICAL IMPRESSION(S) / ED DIAGNOSES   Final diagnoses:  AKI (acute kidney injury) (HCC)  Dementia without behavioral disturbance, psychotic disturbance, mood disturbance, or anxiety, unspecified dementia severity, unspecified dementia type (HCC)  Weakness     Rx / DC Orders   ED Discharge Orders     None         Note:  This document was prepared using Dragon voice recognition software and may include unintentional dictation errors.   Lubertha Rush, MD 12/17/23 [phone]

## 2023-12-17 NOTE — Evaluation (Addendum)
 Occupational Therapy Evaluation Patient Details Name: Jill Snow MRN: [id number] DOB: 25-Mar-1934 Today's Date: 12/17/2023   History of Present Illness   Jill Snow is a 89 y.o. female with history of dementia, chronic A-fib, hypertension who comes in with concerns for altered mental status and weakness.    Clinical Impressions Jill Snow was seen for OT evaluation this date. Pt is poor historian, per chart pt has assistance from family and is ambulatory. Pt oriented to name/birthdate only. Tangential and garbled speech with intermittent word salad. At times pt agitated but redirectable to task. On arrival pt found with urine incontinence, clean linen provided.   Pt currently requires MAX A x2 sup<>sit, cognition limiting command following to complete bed mobility with less assist. MIN A + RW for toilet t/f, +2 for safety 2/2 pt impulsivity and cognitive deficits. MOD A don B shoes in sitting. Pt would benefit from OT trial x3 sessions to assess ability to participate meaningfully. Upon hospital discharge, recommend OT follow up <3 hours/day; however if able to have 24/7 supervision, anticipate pt would benefit from return to familiar environment.    If plan is discharge home, recommend the following:   A lot of help with walking and/or transfers;A lot of help with bathing/dressing/bathroom;Help with stairs or ramp for entrance;Supervision due to cognitive status     Functional Status Assessment   Patient has had a recent decline in their functional status and demonstrates the ability to make significant improvements in function in a reasonable and predictable amount of time.     Equipment Recommendations   BSC/3in1     Recommendations for Other Services         Precautions/Restrictions   Precautions Precautions: Fall Recall of Precautions/Restrictions: Impaired Restrictions Weight Bearing Restrictions Per Provider Order: No     Mobility Bed  Mobility Overal bed mobility: Needs Assistance Bed Mobility: Supine to Sit, Sit to Supine     Supine to sit: Max assist, +2 for physical assistance Sit to supine: Total assist, +2 for physical assistance   General bed mobility comments: cognition limiting participation    Transfers Overall transfer level: Needs assistance Equipment used: Rolling walker (2 wheels) Transfers: Sit to/from Stand Sit to Stand: Contact guard assist, +2 safety/equipment                  Balance Overall balance assessment: Needs assistance Sitting-balance support: Feet supported Sitting balance-Leahy Scale: Fair     Standing balance support: Bilateral upper extremity supported Standing balance-Leahy Scale: Fair                             ADL either performed or assessed with clinical judgement   ADL Overall ADL's : Needs assistance/impaired                                       General ADL Comments: MIN A + RW for toilet t/f, +2 for safety 2/2 pt impulsivity and cognitive deficits. MOD A don B shoes in sitting.      Pertinent Vitals/Pain Pain Assessment Pain Assessment: PAINAD Breathing: normal Negative Vocalization: occasional moan/groan, low speech, negative/disapproving quality Facial Expression: smiling or inexpressive Body Language: tense, distressed pacing, fidgeting Consolability: distracted or reassured by voice/touch PAINAD Score: 3 Pain Intervention(s): Repositioned     Extremity/Trunk Assessment Upper Extremity Assessment Upper Extremity Assessment: Generalized weakness  Lower Extremity Assessment Lower Extremity Assessment: Generalized weakness       Communication Communication Communication: Impaired Factors Affecting Communication: Difficulty expressing self;Reduced clarity of speech   Cognition Arousal: Alert Behavior During Therapy: Lability Cognition: History of cognitive impairments, No family/caregiver present to determine  baseline   Orientation impairments: Place, Time, Situation   Memory impairment (select all impairments): Short-term memory   Executive functioning impairment (select all impairments): Initiation OT - Cognition Comments: garbled, tangential speech. ACcurately states name/birthdate.                 Following commands: Impaired Following commands impaired: Follows one step commands inconsistently     Cueing  General Comments   Cueing Techniques: Verbal cues;Gestural cues;Tactile cues;Visual cues              Home Living Family/patient expects to be discharged to:: Private residence     Type of Home: House Home Access: Stairs to enter                         Additional Comments: Per chart pt stays with her daugher during the day and stays with her son at night. No family available to confirm      Prior Functioning/Environment Prior Level of Function : Patient poor historian/Family not available                    OT Problem List: Impaired balance (sitting and/or standing);Decreased coordination;Decreased safety awareness;Decreased knowledge of use of DME or AE;Decreased cognition;Decreased activity tolerance;Decreased strength;Decreased knowledge of precautions   OT Treatment/Interventions: Energy conservation;DME and/or AE instruction;Therapeutic exercise;Self-care/ADL training;Therapeutic activities;Cognitive remediation/compensation;Patient/family education;Balance training      OT Goals(Current goals can be found in the care plan section)   Acute Rehab OT Goals Patient Stated Goal: to go home OT Goal Formulation: With patient Time For Goal Achievement: 12/31/23 Potential to Achieve Goals: Fair ADL Goals Pt Will Perform Grooming: with supervision;standing Pt Will Perform Lower Body Dressing: with min assist;sit to/from stand Pt Will Transfer to Toilet: with modified independence;ambulating;regular height toilet   OT Frequency:  Min  2X/week    Co-evaluation PT/OT/SLP Co-Evaluation/Treatment: Yes Reason for Co-Treatment: Necessary to address cognition/behavior during functional activity;For patient/therapist safety PT goals addressed during session: Mobility/safety with mobility OT goals addressed during session: ADL's and self-care      AM-PAC OT "6 Clicks" Daily Activity     Outcome Measure Help from another person eating meals?: A Little Help from another person taking care of personal grooming?: A Little Help from another person toileting, which includes using toliet, bedpan, or urinal?: A Lot Help from another person bathing (including washing, rinsing, drying)?: A Lot Help from another person to put on and taking off regular upper body clothing?: A Little Help from another person to put on and taking off regular lower body clothing?: A Lot 6 Click Score: 15   End of Session Equipment Utilized During Treatment: Rolling walker (2 wheels)  Activity Tolerance: Patient tolerated treatment well Patient left: in bed;with call bell/phone within reach  OT Visit Diagnosis: Unsteadiness on feet (R26.81);Other abnormalities of gait and mobility (R26.89);Repeated falls (R29.6);Muscle weakness (generalized) (M62.81)                Time: 9147-8295 OT Time Calculation (min): 11 min Charges:  OT General Charges $OT Visit: 1 Visit OT Evaluation $OT Eval Low Complexity: 1 Low  Gordan Latina, M.S. OTR/L  12/17/23, 3:59 PM  ascom [phone]

## 2023-12-17 NOTE — ED Notes (Signed)
 Pt ate 90% of her dinner tray.

## 2023-12-17 NOTE — ED Notes (Signed)
 Pharmacy made aware of need for medication verification in order to give po.

## 2023-12-17 NOTE — ED Notes (Signed)
Patient up with PT.

## 2023-12-17 NOTE — H&P (Signed)
 History and Physical    Jill Snow XBJ:[id number] DOB: 1933-11-09 DOA: 12/17/2023  PCP: Devorah Fonder, MD (Confirm with patient/family/NH records and if not entered, this has to be entered at [facility] point of entry) Patient coming from: Home  I have personally briefly reviewed patient's old medical records in [facility]  Chief Complaint: Feeling weak, leg swelling  HPI: Jill Snow is a 89 y.o. female with medical history significant of PAF on Eliquis , advanced dementia, HLD, chronic HFpEF, recently diagnosed UTI, presented with worsening of generalized weakness.  Patient is demented and unable to provide any history, all history provided by son at bedside.  Son reported that patient was recently hospitalized for UTI process sent home with p.o. antibiotics which patient completed 3 to 4 days ago.  Since discharge home, patient has been very weak and unsteady on her feet, patient however not using any support including cane/crutches/roller walker.  Has had several near falls and yesterday, son send the patient was discharged home daytime and sister reported that the patient has been more wobbly and last night, patient refused to get out of bed and send trial of helper stand up and found her legs were very weak and swollen.  ED Course: Afebrile, not tachycardia blood pressure 106/57 O2 saturation 100% on room air.  Blood work showed hemoglobin 10.9 WBC 4.9 BUN 17 creatinine 1.2.  UA showed WBC 3+.  Patient was given ceftriaxone  x 1 in the ED.  Review of Systems: Unable to perform, patient has baseline dementia.  Past Medical History:  Diagnosis Date   Atrial flutter (HCC)    a. s/p successful TEE/DCCV in 2014; b. TEE 2014 showed EF 45-50%, mild bi-atrial enlargement, mod MR   Dementia (HCC)    Dyspnea    History of chicken pox    Hypercholesterolemia    Hypertension    Mitral regurgitation    a. echo 2014: EF 40-45%, mildly dilated RV, mod reduced RV systolic fxn,  mod dilated LA, Mild to mod MR, mod TR, mildly elevated PASP   PAF (paroxysmal atrial fibrillation) (HCC)    a. initial episode 2011; b. not on long term anticoagulation since 06/2013   RBBB (right bundle branch block)    Seasonal allergies    Sleep apnea    a. on CPAP   Thyroid  nodule 05/19/2017    Past Surgical History:  Procedure Laterality Date   ABDOMINAL HYSTERECTOMY     TONSILLECTOMY AND ADENOIDECTOMY  1947   tummy tuck       reports that she has never smoked. She has never used smokeless tobacco. She reports that she does not drink alcohol and does not use drugs.  Allergies  Allergen Reactions   Amoxicillin  Nausea Only   Augmentin  [Amoxicillin -Pot Clavulanate] Nausea And Vomiting   Codeine Anaphylaxis   Crestor  [Rosuvastatin ] Other (See Comments)    Myalgias     Penicillins Other (See Comments)    Unknown reaction Tolerates cephalosporins     Family History  Problem Relation Age of Onset   Stroke Mother    Heart attack Mother    Arthritis Mother    Hyperlipidemia Mother    Heart disease Mother    Diabetes Mother    Diabetes Sister    Cancer Daughter    Heart disease Daughter    Diabetes Daughter    Diabetes Son    Diabetes Maternal Grandmother    Diabetes Maternal Grandfather     Prior to Admission medications  Medication Sig Start Date End Date Taking? Authorizing Provider  amiodarone  (PACERONE ) 200 MG tablet Take 1 tablet (200 mg total) by mouth 2 (two) times daily. 12/10/23 02/08/24 Yes Ree Candy, MD  apixaban  (ELIQUIS ) 5 MG TABS tablet Take 1 tablet (5 mg total) by mouth 2 (two) times daily. 04/26/23  Yes Gollan, Timothy J, MD  atorvastatin  (LIPITOR ) 40 MG tablet TAKE 1 TABLET BY MOUTH EVERY DAY 07/12/23  Yes Kent Pear, MD  diltiazem  (CARDIZEM  CD) 120 MG 24 hr capsule Take 1 capsule (120 mg total) by mouth 2 (two) times daily. 12/10/23 02/08/24 Yes Ree Candy, MD  montelukast  (SINGULAIR ) 10 MG tablet TAKE 1 TABLET BY MOUTH  EVERY DAY 07/12/23  Yes Kent Pear, MD  omeprazole  (PRILOSEC) 20 MG capsule Take 1 capsule (20 mg total) by mouth daily. 08/05/23  Yes Gollan, Timothy J, MD  potassium chloride  SA (KLOR-CON  M20) 20 MEQ tablet Take 1 tablet (20 mEq total) by mouth daily. May take an extra tablet (20 Meq) daily as needed with extra lasix . 04/26/23  Yes Gollan, Timothy J, MD  acetaminophen  (TYLENOL ) 650 MG CR tablet Take 1,300 mg by mouth every 8 (eight) hours as needed for pain.    [provider]  Cholecalciferol  (VITAMIN D -3 PO) Take 1 capsule by mouth at bedtime.    [provider]  polyethylene glycol (MIRALAX  / GLYCOLAX ) 17 g packet Take 17 g by mouth daily as needed for mild constipation. 12/27/22   Doroteo Gasmen, MD  QUEtiapine  (SEROQUEL ) 50 MG tablet Take 1 tablet (50 mg total) by mouth at bedtime. 12/10/23 02/08/24  Ree Candy, MD  VITAMIN E  PO Take 1 capsule by mouth daily.    [provider]    Physical Exam: Vitals:   12/17/23 1100 12/17/23 1130 12/17/23 1200 12/17/23 1300  BP: (!) 118/47 (!) 96/49 (!) 102/47 (!) 109/58  Pulse: 70     Resp: 16 15 17 15   Temp:      TempSrc:      SpO2: 100%     Weight:        Constitutional: NAD, calm, comfortable Vitals:   12/17/23 1100 12/17/23 1130 12/17/23 1200 12/17/23 1300  BP: (!) 118/47 (!) 96/49 (!) 102/47 (!) 109/58  Pulse: 70     Resp: 16 15 17 15   Temp:      TempSrc:      SpO2: 100%     Weight:       Eyes: PERRL, lids and conjunctivae normal ENMT: Mucous membranes are moist. Posterior pharynx clear of any exudate or lesions.Normal dentition.  Neck: normal, supple, no masses, no thyromegaly Respiratory: clear to auscultation bilaterally, no wheezing, no crackles. Normal respiratory effort. No accessory muscle use.  Cardiovascular: Regular rate and rhythm, no murmurs / rubs / gallops.  2+ extremity edema. 2+ pedal pulses. No carotid bruits.  Abdomen: no tenderness, no masses palpated. No  hepatosplenomegaly. Bowel sounds positive.  Musculoskeletal: no clubbing / cyanosis. No joint deformity upper and lower extremities. Good ROM, no contractures. Normal muscle tone.  Skin: no rashes, lesions, ulcers. No induration Neurologic: CN 2-12 grossly intact. Sensation intact, DTR normal. Strength 5/5 in all 4.  Psychiatric: Awake, oriented to herself, confused about time and place   Labs on Admission: I have personally reviewed following labs and imaging studies  CBC: Recent Labs  Lab 12/17/23 0809  WBC 4.9  NEUTROABS 3.5  HGB 10.9*  HCT 34.1*  MCV 100.3*  PLT 234  Basic Metabolic Panel: Recent Labs  Lab 12/17/23 0809  NA 140  K 4.7  CL 109  CO2 23  GLUCOSE 96  BUN 17  CREATININE 1.22*  CALCIUM  9.1  MG 2.4   GFR: Estimated Creatinine Clearance: 32.8 mL/min (A) (by C-G formula based on SCr of 1.22 mg/dL (H)). Liver Function Tests: Recent Labs  Lab 12/17/23 0809  AST 17  ALT 16  ALKPHOS 74  BILITOT 1.4*  PROT 6.9  ALBUMIN 3.3*   No results for input(s): "LIPASE", "AMYLASE" in the last 168 hours. Recent Labs  Lab 12/17/23 0809  AMMONIA 21   Coagulation Profile: No results for input(s): "INR", "PROTIME" in the last 168 hours. Cardiac Enzymes: No results for input(s): "CKTOTAL", "CKMB", "CKMBINDEX", "TROPONINI" in the last 168 hours. BNP (last 3 results) No results for input(s): "PROBNP" in the last 8760 hours. HbA1C: No results for input(s): "HGBA1C" in the last 72 hours. CBG: Recent Labs  Lab 12/17/23 0815  GLUCAP 103*   Lipid Profile: No results for input(s): "CHOL", "HDL", "LDLCALC", "TRIG", "CHOLHDL", "LDLDIRECT" in the last 72 hours. Thyroid  Function Tests: No results for input(s): "TSH", "T4TOTAL", "FREET4", "T3FREE", "THYROIDAB" in the last 72 hours. Anemia Panel: No results for input(s): "VITAMINB12", "FOLATE", "FERRITIN", "TIBC", "IRON", "RETICCTPCT" in the last 72 hours. Urine analysis:    Component Value Date/Time   COLORURINE  AMBER (A) 12/17/2023 0808   APPEARANCEUR CLOUDY (A) 12/17/2023 0808   APPEARANCEUR Hazy 05/05/2012 0014   LABSPEC 1.019 12/17/2023 0808   LABSPEC 1.017 05/05/2012 0014   PHURINE 5.0 12/17/2023 0808   GLUCOSEU NEGATIVE 12/17/2023 0808   GLUCOSEU NEGATIVE 06/04/2021 1134   HGBUR NEGATIVE 12/17/2023 0808   BILIRUBINUR NEGATIVE 12/17/2023 0808   BILIRUBINUR neg 06/09/2021 1621   BILIRUBINUR Negative 05/05/2012 0014   KETONESUR NEGATIVE 12/17/2023 0808   PROTEINUR NEGATIVE 12/17/2023 0808   UROBILINOGEN 0.2 06/09/2021 1621   UROBILINOGEN 0.2 06/04/2021 1134   NITRITE NEGATIVE 12/17/2023 0808   LEUKOCYTESUR MODERATE (A) 12/17/2023 0808   LEUKOCYTESUR 3+ 05/05/2012 0014    Radiological Exams on Admission: CT HEAD WO CONTRAST ( ) Result Date: 12/17/2023 CLINICAL DATA:  Head trauma, minor (Age >= 65y); Neck trauma (Age >= 65y) EXAM: CT HEAD WITHOUT CONTRAST CT CERVICAL SPINE WITHOUT CONTRAST TECHNIQUE: Multidetector CT imaging of the head and cervical spine was performed following the standard protocol without intravenous contrast. Multiplanar CT image reconstructions of the cervical spine were also generated. RADIATION DOSE REDUCTION: This exam was performed according to the departmental dose-optimization program which includes automated exposure control, adjustment of the mA and/or kV according to patient size and/or use of iterative reconstruction technique. COMPARISON:  CT scan head from 11/29/2023 and CT scan head and cervical spine from 02/10/2023. FINDINGS: CT HEAD FINDINGS Brain: No evidence of acute infarction, hemorrhage, hydrocephalus, extra-axial collection or mass lesion/mass effect. There is bilateral periventricular hypodensity, which is non-specific but most likely seen in the settings of microvascular ischemic changes. Mild in extent. Otherwise normal appearance of brain parenchyma. Cerebral volume loss with enlargement of the ventricles. Vascular: No hyperdense vessel or unexpected  calcification. Intracranial arteriosclerosis. Skull: Normal. Negative for fracture or focal lesion. Sinuses/Orbits: No acute finding. Asymmetrically thickened wall of visualized small portion of right maxillary sinus may suggest sequela of chronic sinusitis. Other: Visualized mastoid air cells are unremarkable. No mastoid effusion. CT CERVICAL SPINE FINDINGS Alignment: There is reversal of cervical lordosis. There is minimal/grade 1 anterolisthesis of C3 over C4 and C4 over C5. There is minimal retrolisthesis of C5  over C6. These are likely degenerative. Please note, this examination does not assess for ligamentous injury or stability. Skull base and vertebrae: No acute fracture. No primary bone lesion or focal pathologic process. Soft tissues and spinal canal: No prevertebral fluid or swelling. No visible canal hematoma. Disc levels: Moderate multilevel degenerative changes characterized by reduced intervertebral disc height, facet arthropathy and marginal osteophyte formation. Upper chest: Negative. Other: Asymmetrically enlarged left thyroid  lobe secondary to at least 2.1 x 3.1 cm hypoattenuating nodule, which is incompletely imaged on the current exam. This is incompletely characterized on the current exam and appears grossly similar to the prior CT scan. Please refer to prior thyroid  ultrasound and biopsy from year 2018 for details. IMPRESSION: 1. No acute intracranial abnormality. 2. No acute osseous injury or traumatic listhesis of the cervical spine. 3. Multiple other nonacute observations, as described above. Electronically Signed   By: Beula Brunswick M.D.   On: 12/17/2023 09:33   CT Cervical Spine Wo Contrast Result Date: 12/17/2023 CLINICAL DATA:  Head trauma, minor (Age >= 65y); Neck trauma (Age >= 65y) EXAM: CT HEAD WITHOUT CONTRAST CT CERVICAL SPINE WITHOUT CONTRAST TECHNIQUE: Multidetector CT imaging of the head and cervical spine was performed following the standard protocol without intravenous  contrast. Multiplanar CT image reconstructions of the cervical spine were also generated. RADIATION DOSE REDUCTION: This exam was performed according to the departmental dose-optimization program which includes automated exposure control, adjustment of the mA and/or kV according to patient size and/or use of iterative reconstruction technique. COMPARISON:  CT scan head from 11/29/2023 and CT scan head and cervical spine from 02/10/2023. FINDINGS: CT HEAD FINDINGS Brain: No evidence of acute infarction, hemorrhage, hydrocephalus, extra-axial collection or mass lesion/mass effect. There is bilateral periventricular hypodensity, which is non-specific but most likely seen in the settings of microvascular ischemic changes. Mild in extent. Otherwise normal appearance of brain parenchyma. Cerebral volume loss with enlargement of the ventricles. Vascular: No hyperdense vessel or unexpected calcification. Intracranial arteriosclerosis. Skull: Normal. Negative for fracture or focal lesion. Sinuses/Orbits: No acute finding. Asymmetrically thickened wall of visualized small portion of right maxillary sinus may suggest sequela of chronic sinusitis. Other: Visualized mastoid air cells are unremarkable. No mastoid effusion. CT CERVICAL SPINE FINDINGS Alignment: There is reversal of cervical lordosis. There is minimal/grade 1 anterolisthesis of C3 over C4 and C4 over C5. There is minimal retrolisthesis of C5 over C6. These are likely degenerative. Please note, this examination does not assess for ligamentous injury or stability. Skull base and vertebrae: No acute fracture. No primary bone lesion or focal pathologic process. Soft tissues and spinal canal: No prevertebral fluid or swelling. No visible canal hematoma. Disc levels: Moderate multilevel degenerative changes characterized by reduced intervertebral disc height, facet arthropathy and marginal osteophyte formation. Upper chest: Negative. Other: Asymmetrically enlarged left  thyroid  lobe secondary to at least 2.1 x 3.1 cm hypoattenuating nodule, which is incompletely imaged on the current exam. This is incompletely characterized on the current exam and appears grossly similar to the prior CT scan. Please refer to prior thyroid  ultrasound and biopsy from year 2018 for details. IMPRESSION: 1. No acute intracranial abnormality. 2. No acute osseous injury or traumatic listhesis of the cervical spine. 3. Multiple other nonacute observations, as described above. Electronically Signed   By: Beula Brunswick M.D.   On: 12/17/2023 09:33   CT Renal Stone Study Result Date: 12/17/2023 CLINICAL DATA:  Abdominal/flank pain, stone suspected. EXAM: CT ABDOMEN AND PELVIS WITHOUT CONTRAST TECHNIQUE: Multidetector CT imaging of  the abdomen and pelvis was performed following the standard protocol without IV contrast. RADIATION DOSE REDUCTION: This exam was performed according to the departmental dose-optimization program which includes automated exposure control, adjustment of the mA and/or kV according to patient size and/or use of iterative reconstruction technique. COMPARISON:  CT scan abdomen and pelvis from 03/11/2020. FINDINGS: Lower chest: Redemonstration of moderate-to-large posteromedial diaphragmatic hernia containing majority of the stomach. There are atelectatic changes in the bilateral lower lobes with associated trace pleural effusions. No mass or consolidation. No suspicious lung nodule. The heart is normal in size. No pericardial effusion. Hepatobiliary: The liver is normal in size. Non-cirrhotic configuration. No suspicious mass. No intrahepatic or extrahepatic bile duct dilation. Small amount of layering gallstones/sludge noted without imaging signs of acute cholecystitis. Normal gallbladder wall thickness. No pericholecystic inflammatory changes. Pancreas: Unremarkable. No pancreatic ductal dilatation or surrounding inflammatory changes. Spleen: Within normal limits. No focal lesion.  Adrenals/Urinary Tract: Adrenal glands are unremarkable. No suspicious renal mass within the limitations of this unenhanced exam. No hydroureteronephrosis or nephroureterolithiasis. Unremarkable urinary bladder. Stomach/Bowel: No disproportionate dilation of the small or large bowel loops. No evidence of abnormal bowel wall thickening or inflammatory changes. The appendix was not visualized; however there is no acute inflammatory process in the right lower quadrant. Moderate-to-large amount of stool burden noted throughout the colon. Vascular/Lymphatic: No ascites or pneumoperitoneum. No abdominal or pelvic lymphadenopathy, by size criteria. No aneurysmal dilation of the major abdominal arteries. There are moderate peripheral atherosclerotic vascular calcifications of the aorta and its major branches. Reproductive: The uterus is surgically absent. No large adnexal mass. Other: There is a small fat containing periumbilical hernia. There also bilateral small fat containing inguinal hernias. The soft tissues and abdominal wall are otherwise unremarkable. Musculoskeletal: No suspicious osseous lesions. There are moderate multilevel degenerative changes in the visualized spine. IMPRESSION: 1. No nephroureterolithiasis or obstructive uropathy. 2. No acute inflammatory process identified within the abdomen or pelvis. 3. Atelectatic changes in the bilateral lung lower lobes with associated trace bilateral pleural effusions. No mass or consolidation. 4. Multiple chronic nonemergent observations, as described above. Aortic Atherosclerosis (ICD10-I70.0). Electronically Signed   By: Beula Brunswick M.D.   On: 12/17/2023 09:26    EKG: Independently reviewed.  Sinus rhythm, chronic RBBB, no acute ST changes.  Assessment/Plan Principal Problem:   Impaired ambulation  (please populate well all problems here in Problem List. (For example, if patient is on BP meds at home and you resume or decide to hold them, it is a problem  that needs to be her. Same for CAD, COPD, HLD and so on)  Acute on chronic ambulatory impairment Deconditioning - Likely from recurrent UTI and decompensated CHF with significant peripheral edema -Treat UTI and CHF - PT evaluation  Recurrent UTI - UA showed 3+ WBC however most recent urine culture showed mixed flora - Continue empiric ceftriaxone   Acute on chronic HFpEF decompensation - Significant fluid overload - Start Lasix  20 mg twice daily - Monitor kidney function  PAF - In sinus rhythm, continue amiodarone  and Eliquis  - Use Cardizem  with caution as patient has worsening of peripheral edema  Advanced dementia with sundowning - Fall precaution - Discussed with son and patient appears to have significant sundowning, will have as needed Zyprexa   DVT prophylaxis: Eliquis  Code Status: DNR Family Communication: Son at bedside Disposition Plan: Patient is sick with significant deconditioning from UTI requiring IV antibiotics and inpatient PT evaluation, complicated by CHF decompensation requiring IV diuresis, expect more than 2 midnight  hospital stay Consults called: None Admission status: Telemetry admission   [place] MD [facility] Pager [phone]  12/17/2023, 2:30 PM

## 2023-12-17 NOTE — Evaluation (Addendum)
 Physical Therapy Evaluation Patient Details Name: CELLINA SOLHEIM MRN: [id number] DOB: 01-Aug-1934 Today's Date: 12/17/2023  History of Present Illness  TONYE CALLIGAN is a 89 y.o. female with history of dementia, chronic A-fib, hypertension who comes in with concerns for altered mental status and weakness.  Clinical Impression  Patient alert, oriented to her name only, noted for tangential speech throughout. Able to follow one step commands once or twice during session, primarily needed tactile cues/facilitation for all mobility, supine to sit totalAx2, returned to supine maxAx2. Sit <> stand from EOB and from low commode, CGA, definite use of hands from pt. She ambulated ~77ft with RW and CGA (minA for RW guidance). Returned to supine with needs in reach. Pt placed on PT trial to assess pt's mobility and ability to participate in therapy services.         If plan is discharge home, recommend the following: Supervision due to cognitive status;A little help with walking and/or transfers;A little help with bathing/dressing/bathroom   Can travel by private vehicle   Yes    Equipment Recommendations  (TBD)  Recommendations for Other Services       Functional Status Assessment Patient has had a recent decline in their functional status and demonstrates the ability to make significant improvements in function in a reasonable and predictable amount of time.     Precautions / Restrictions Precautions Precautions: Fall Recall of Precautions/Restrictions: Impaired Restrictions Weight Bearing Restrictions Per Provider Order: No      Mobility  Bed Mobility Overal bed mobility: Needs Assistance Bed Mobility: Sit to Supine, Supine to Sit     Supine to sit: Max assist, +2 for physical assistance Sit to supine: Total assist, +2 for physical assistance   General bed mobility comments: cognition limiting participation    Transfers Overall transfer level: Needs assistance Equipment  used: Rolling walker (2 wheels) Transfers: Sit to/from Stand Sit to Stand: Contact guard assist, +2 safety/equipment           General transfer comment: tactile and faciltation needed for tasks    Ambulation/Gait Ambulation/Gait assistance: Contact guard assist Gait Distance (Feet): 40 Feet Assistive device: Rolling walker (2 wheels)         General Gait Details: to and from commode, CGA -minA (minA for RW management)  Stairs            Wheelchair Mobility     Tilt Bed    Modified Rankin (Stroke Patients Only)       Balance Overall balance assessment: Needs assistance Sitting-balance support: Feet supported Sitting balance-Leahy Scale: Fair     Standing balance support: Bilateral upper extremity supported Standing balance-Leahy Scale: Fair                               Pertinent Vitals/Pain Pain Assessment Pain Assessment: PAINAD Breathing: normal Negative Vocalization: occasional moan/groan, low speech, negative/disapproving quality Facial Expression: smiling or inexpressive Body Language: tense, distressed pacing, fidgeting Consolability: distracted or reassured by voice/touch PAINAD Score: 3 Pain Intervention(s): Limited activity within patient's tolerance, Repositioned    Home Living Family/patient expects to be discharged to:: Private residence     Type of Home: House Home Access: Stairs to enter           Additional Comments: Per chart pt stays with her daugher during the day and stays with her son at night. No family available to confirm    Prior Function Prior Level of  Function : Patient poor historian/Family not available                     Extremity/Trunk Assessment   Upper Extremity Assessment Upper Extremity Assessment: Generalized weakness    Lower Extremity Assessment Lower Extremity Assessment: Generalized weakness       Communication   Communication Communication: Impaired Factors Affecting  Communication: Difficulty expressing self;Reduced clarity of speech    Cognition Arousal: Alert Behavior During Therapy: Lability   PT - Cognitive impairments: History of cognitive impairments                         Following commands: Impaired Following commands impaired: Follows one step commands inconsistently     Cueing Cueing Techniques: Verbal cues, Gestural cues, Tactile cues, Visual cues     General Comments      Exercises     Assessment/Plan    PT Assessment Patient needs continued PT services  PT Problem List Decreased safety awareness;Decreased cognition;Decreased mobility;Decreased balance;Decreased strength       PT Treatment Interventions Gait training;DME instruction;Therapeutic exercise;Balance training;Neuromuscular re-education;Stair training;Functional mobility training;Therapeutic activities    PT Goals (Current goals can be found in the Care Plan section)  Acute Rehab PT Goals Patient Stated Goal: pt unable to state Time For Goal Achievement: 12/31/23 Potential to Achieve Goals: Fair    Frequency Min 2X/week (trial PT services pending participation)     Co-evaluation PT/OT/SLP Co-Evaluation/Treatment: Yes Reason for Co-Treatment: Necessary to address cognition/behavior during functional activity;For patient/therapist safety PT goals addressed during session: Mobility/safety with mobility OT goals addressed during session: ADL's and self-care       AM-PAC PT "6 Clicks" Mobility  Outcome Measure Help needed turning from your back to your side while in a flat bed without using bedrails?: A Little Help needed moving from lying on your back to sitting on the side of a flat bed without using bedrails?: A Little Help needed moving to and from a bed to a chair (including a wheelchair)?: A Little Help needed standing up from a chair using your arms (e.g., wheelchair or bedside chair)?: A Little Help needed to walk in hospital room?: A  Little Help needed climbing 3-5 steps with a railing? : A Little 6 Click Score: 18    End of Session   Activity Tolerance: Patient tolerated treatment well Patient left: in bed Nurse Communication: Mobility status PT Visit Diagnosis: Muscle weakness (generalized) (M62.81);Unsteadiness on feet (R26.81);Difficulty in walking, not elsewhere classified (R26.2)    Time: 8295-6213 PT Time Calculation (min) (ACUTE ONLY): 11 min   Charges:   PT Evaluation $PT Eval Low Complexity: 1 Low   PT General Charges $$ ACUTE PT VISIT: 1 Visit         Darien Eden PT, DPT 4:04 PM,12/17/23

## 2023-12-17 NOTE — ED Notes (Signed)
 Rn to bedside to introduce self to pt. Family at bedside. Per family she was just in the hospital for 11 days and when she came home on Saturday she hasn't been herself since she came home. Pt is alert, but confused. Pt has dementia and is unable to walk. Pt does not have fall bracelet on and no socks. This RN placed all fall precautions.

## 2023-12-17 NOTE — ED Notes (Addendum)
 This Rn to bedside bc fall alarm was going off. Pt had pulled out her purewick and was sitting on the end of the bed. Pt was re-directed and purewick placed back and TV turned on to try to help re-direct pt.  Pt is trying to pull IV out and pull her brief off.

## 2023-12-17 NOTE — ED Triage Notes (Signed)
 EMS reports patient's son called out due to inability to walk since last night; recently treated for UTI.  BGL 81 20G right hand BP 166/59 O2 98% room air HR 65

## 2023-12-18 DIAGNOSIS — R262 Difficulty in walking, not elsewhere classified: Secondary | ICD-10-CM | POA: Diagnosis not present

## 2023-12-18 DIAGNOSIS — G9341 Metabolic encephalopathy: Secondary | ICD-10-CM | POA: Diagnosis not present

## 2023-12-18 DIAGNOSIS — N3000 Acute cystitis without hematuria: Secondary | ICD-10-CM

## 2023-12-18 LAB — BASIC METABOLIC PANEL WITH GFR
Anion gap: 11 (ref 5–15)
BUN: 23 mg/dL (ref 8–23)
CO2: 26 mmol/L (ref 22–32)
Calcium: 9 mg/dL (ref 8.9–10.3)
Chloride: 105 mmol/L (ref 98–111)
Creatinine, Ser: 0.95 mg/dL (ref 0.44–1.00)
GFR, Estimated: 58 mL/min — ABNORMAL LOW (ref >60)
Glucose, Bld: 100 mg/dL — ABNORMAL HIGH (ref 70–99)
Potassium: 3.8 mmol/L (ref 3.5–5.1)
Sodium: 142 mmol/L (ref 135–145)

## 2023-12-18 MED ORDER — DILTIAZEM HCL ER COATED BEADS 120 MG PO CP24
120.0000 mg | ORAL_CAPSULE | Freq: Two times a day (BID) | ORAL | Status: DC
  Administered 2023-12-18 – 2023-12-22 (×7): 120 mg via ORAL
  Filled 2023-12-18 (×9): qty 1

## 2023-12-18 MED ORDER — FUROSEMIDE 10 MG/ML IJ SOLN
60.0000 mg | Freq: Two times a day (BID) | INTRAMUSCULAR | Status: DC
  Administered 2023-12-18 – 2023-12-19 (×3): 60 mg via INTRAVENOUS
  Filled 2023-12-18 (×3): qty 8

## 2023-12-18 NOTE — Plan of Care (Signed)

## 2023-12-18 NOTE — Hospital Course (Signed)
 89 y.o. female with medical history significant of PAF on Eliquis , advanced dementia, HLD, chronic HFpEF, recently diagnosed UTI, presented with worsening of generalized weakness.   Assessment and Plan:   Acute metabolic encephalopathy with underlying advanced dementia - Likely exacerbated by recurrent UTI.  Has significant sundowning as well.  Continue to attempt to reorient, as needed Zyprexa .   Recurrent cystitis -UA showing LE, WBCs, bacteria.  Likely contributing to encephalopathy and weakness.  Empiric ceftriaxone  on board.  IV fluids.  Monitor urine culture   Acute exacerbation of chronic HFpEF - Increase Lasix  to 60 mg twice daily.  Strict I's and O's.  Appears to be responding.   Physical debilitation muscle weakness - Exacerbated by above.  PT consult pending.  Per previous DC summary 4/25, previous PT/OT recommending home STIR however patient family denied wanting to take patient home with home health.  Dissipate this will be similar decision.   Paroxysmal atrial fibrillation - Continue amiodarone , on Eliquis .

## 2023-12-18 NOTE — Progress Notes (Signed)
 Progress Note   Patient: Jill Snow DOB: 1934/07/02 DOA: 12/17/2023     1 DOS: the patient was seen and examined on 12/18/2023   Brief hospital course:  89 y.o. female with medical history significant of PAF on Eliquis , advanced dementia, HLD, chronic HFpEF, recently diagnosed UTI, presented with worsening of generalized weakness.  Assessment and Plan:  Acute metabolic encephalopathy with underlying advanced dementia - Likely exacerbated by recurrent UTI.  Has significant sundowning as well.  Continue to attempt to reorient, as needed Zyprexa .  Recurrent cystitis -UA showing LE, WBCs, bacteria.  Likely contributing to encephalopathy and weakness.  Empiric ceftriaxone  on board.  IV fluids.  Monitor urine culture  Acute exacerbation of chronic HFpEF - Increase Lasix  to 60 mg twice daily.  Strict I's and O's.  Appears to be responding.  Physical debilitation muscle weakness - Exacerbated by above.  PT consult pending.  Per previous DC summary 4/25, previous PT/OT recommending home STIR however patient family denied wanting to take patient home with home health.  Dissipate this will be similar decision.  Paroxysmal atrial fibrillation - Continue amiodarone , on Eliquis .   Subjective: Patient resting comfortably this morning, however not following commands.  Will answer short questions yes and no.  Denies pain, shortness of breath.  Appears confused/encephalopathic.  Vital stable, urine output adequate.  Physical Exam: Vitals:   12/17/23 2000 12/17/23 2045 12/18/23 0850 12/18/23 0853  BP: (!) 108/55 124/60 126/89   Pulse: 76 77 75 65  Resp: 16 16 18    Temp: 98.5 F (36.9 C) 98.4 F (36.9 C) (!) 96.8 F (36 C)   TempSrc: Oral Oral    SpO2: 95% 98% (!) 87% 97%  Weight:        GENERAL:  Alert, encephalopathic, frail HEENT:  EOMI CARDIOVASCULAR:  RRR, no murmurs appreciated RESPIRATORY:  Clear to auscultation, no wheezing, rales, or rhonchi GASTROINTESTINAL:   Soft, nontender, nondistended EXTREMITIES:  No LE edema bilaterally NEURO:  No new focal deficits appreciated SKIN:  No rashes noted PSYCH:  Appropriate mood and affect     Data Reviewed:  No new imaging to review  Labs: CBC: Recent Labs  Lab 12/17/23 0809  WBC 4.9  NEUTROABS 3.5  HGB 10.9*  HCT 34.1*  MCV 100.3*  PLT 234   Basic Metabolic Panel: Recent Labs  Lab 12/17/23 0809 12/18/23 0533  NA 140 142  K 4.7 3.8  CL 109 105  CO2 23 26  GLUCOSE 96 100*  BUN 17 23  CREATININE 1.22* 0.95  CALCIUM  9.1 9.0  MG 2.4  --    Liver Function Tests: Recent Labs  Lab 12/17/23 0809  AST 17  ALT 16  ALKPHOS 74  BILITOT 1.4*  PROT 6.9  ALBUMIN 3.3*   CBG: Recent Labs  Lab 12/17/23 0815  GLUCAP 103*    Scheduled Meds:  amiodarone   200 mg Oral BID   apixaban   5 mg Oral BID   atorvastatin   40 mg Oral Daily   diltiazem   120 mg Oral BID   furosemide   60 mg Intravenous BID   montelukast   10 mg Oral Daily   pantoprazole   40 mg Oral Daily   potassium chloride  SA  20 mEq Oral Daily   QUEtiapine   50 mg Oral QHS   Continuous Infusions:  cefTRIAXone  (ROCEPHIN )  IV 2 g (12/18/23 1053)   PRN Meds:.acetaminophen , OLANZapine , ondansetron  **OR** ondansetron  (ZOFRAN ) IV, polyethylene glycol  Family Communication: None at bedside  Disposition: Status is: Inpatient Remains  inpatient appropriate because: Acute encephalopathy with urinary tract infection     Time spent: 36 minutes  Author: Jodeane Mulligan, DO 12/18/2023 11:48 AM  For on call review [email or web address].

## 2023-12-19 DIAGNOSIS — I5033 Acute on chronic diastolic (congestive) heart failure: Secondary | ICD-10-CM | POA: Diagnosis not present

## 2023-12-19 DIAGNOSIS — N3000 Acute cystitis without hematuria: Secondary | ICD-10-CM | POA: Diagnosis not present

## 2023-12-19 DIAGNOSIS — G9341 Metabolic encephalopathy: Secondary | ICD-10-CM | POA: Diagnosis not present

## 2023-12-19 DIAGNOSIS — R262 Difficulty in walking, not elsewhere classified: Secondary | ICD-10-CM | POA: Diagnosis not present

## 2023-12-19 LAB — CBC
HCT: 38.7 % (ref 36.0–46.0)
Hemoglobin: 12.5 g/dL (ref 12.0–15.0)
MCH: 31.4 pg (ref 26.0–34.0)
MCHC: 32.3 g/dL (ref 30.0–36.0)
MCV: 97.2 fL (ref 80.0–100.0)
Platelets: 257 10*3/uL (ref 150–400)
RBC: 3.98 MIL/uL (ref 3.87–5.11)
RDW: 12.9 % (ref 11.5–15.5)
WBC: 4.3 10*3/uL (ref 4.0–10.5)
nRBC: 0 % (ref 0.0–0.2)

## 2023-12-19 LAB — BASIC METABOLIC PANEL WITH GFR
Anion gap: 17 — ABNORMAL HIGH (ref 5–15)
BUN: 32 mg/dL — ABNORMAL HIGH (ref 8–23)
CO2: 23 mmol/L (ref 22–32)
Calcium: 9.3 mg/dL (ref 8.9–10.3)
Chloride: 101 mmol/L (ref 98–111)
Creatinine, Ser: 1.05 mg/dL — ABNORMAL HIGH (ref 0.44–1.00)
GFR, Estimated: 51 mL/min — ABNORMAL LOW (ref >60)
Glucose, Bld: 85 mg/dL (ref 70–99)
Potassium: 3.7 mmol/L (ref 3.5–5.1)
Sodium: 141 mmol/L (ref 135–145)

## 2023-12-19 LAB — URINE CULTURE: Culture: >=100000 — AB

## 2023-12-19 MED ORDER — FUROSEMIDE 10 MG/ML IJ SOLN
40.0000 mg | Freq: Every day | INTRAMUSCULAR | Status: DC
  Administered 2023-12-20 – 2023-12-22 (×3): 40 mg via INTRAVENOUS
  Filled 2023-12-19 (×3): qty 4

## 2023-12-19 NOTE — Plan of Care (Signed)

## 2023-12-19 NOTE — Progress Notes (Addendum)
 Progress Note   Patient: Jill Snow [id number] DOB: 29-Feb-1934 DOA: 12/17/2023     2 DOS: the patient was seen and examined on 12/19/2023   Brief hospital course:  89 y.o. female with medical history significant of PAF on Eliquis , advanced dementia, HLD, chronic HFpEF, recently diagnosed UTI, presented with worsening of generalized weakness.   Assessment and Plan:   Acute metabolic encephalopathy with underlying advanced dementia - Likely exacerbated by recurrent UTI.  Has significant sundowning as well.  Continue to attempt to reorient, as needed Zyprexa .  Appears to be showing improvement.  Very pleasant this morning.   Recurrent cystitis -UA showing LE, WBCs, bacteria.  Likely contributing to encephalopathy and weakness.  Empiric ceftriaxone  on board.  Urine culture noting E. coli with resistance to ciprofloxacin.  IV fluids.  Patient may benefit from prophylactic antibiotic after treatment of current UTI.   Acute exacerbation of chronic HFpEF - Responded well to Lasix  to 60 mg twice daily.  Diuresed greater than 3 L so far.  Strict I's and O's.  Appears euvolemic.  Will decrease diuretic to Lasix  40 mg IV daily.   Physical debilitation muscle weakness - Exacerbated by above.  PT/OT on board.  Per previous DC summary 4/25, previous PT/OT recommending home STR however patient family denied wanting to take patient home with home health.  Speaking with the son today, reconsidering STR.  Will work closely with family, TOC on disposition planning.   Paroxysmal atrial fibrillation - Continue amiodarone , on Eliquis .   Subjective: Patient resting comfortably this morning.  Mildly confused but otherwise pleasant.  Reportedly delirious overnight.  Slept well this morning however.  No fever, chills, pain, nausea, vomiting.  Some reported coughing with eating breakfast this morning.  Physical Exam: Vitals:   12/18/23 1639 12/18/23 1954 12/19/23 0507 12/19/23 0902  BP: (!) 102/57  112/65 (!) 112/53 (!) 128/55  Pulse: 63 63 (!) 57 (!) 58  Resp: 16 16 18 18   Temp: 98.5 F (36.9 C) 97.8 F (36.6 C) 97.7 F (36.5 C) 98.2 F (36.8 C)  TempSrc:  Oral Oral   SpO2: 97% 97% 100% 99%  Weight:        GENERAL:  Alert, encephalopathic, frail HEENT:  EOMI CARDIOVASCULAR:  RRR, no murmurs appreciated RESPIRATORY:  Clear to auscultation, no wheezing, rales, or rhonchi GASTROINTESTINAL:  Soft, nontender, nondistended EXTREMITIES:  No LE edema bilaterally NEURO:  No new focal deficits appreciated SKIN:  No rashes noted PSYCH:  Appropriate mood and affect    Data Reviewed:  No new imaging reviewed  Labs: CBC: Recent Labs  Lab 12/17/23 0809 12/19/23 0733  WBC 4.9 4.3  NEUTROABS 3.5  --   HGB 10.9* 12.5  HCT 34.1* 38.7  MCV 100.3* 97.2  PLT 234 257   Basic Metabolic Panel: Recent Labs  Lab 12/17/23 0809 12/18/23 0533 12/19/23 0733  NA 140 142 141  K 4.7 3.8 3.7  CL 109 105 101  CO2 23 26 23   GLUCOSE 96 100* 85  BUN 17 23 32*  CREATININE 1.22* 0.95 1.05*  CALCIUM  9.1 9.0 9.3  MG 2.4  --   --    Liver Function Tests: Recent Labs  Lab 12/17/23 0809  AST 17  ALT 16  ALKPHOS 74  BILITOT 1.4*  PROT 6.9  ALBUMIN 3.3*   CBG: Recent Labs  Lab 12/17/23 0815  GLUCAP 103*    Scheduled Meds:  amiodarone   200 mg Oral BID   apixaban   5 mg Oral BID  atorvastatin   40 mg Oral Daily   diltiazem   120 mg Oral BID   furosemide   60 mg Intravenous BID   montelukast   10 mg Oral Daily   pantoprazole   40 mg Oral Daily   potassium chloride  SA  20 mEq Oral Daily   QUEtiapine   50 mg Oral QHS   Continuous Infusions:  cefTRIAXone  (ROCEPHIN )  IV 2 g (12/19/23 1027)   PRN Meds:.acetaminophen , OLANZapine , ondansetron  **OR** ondansetron  (ZOFRAN ) IV, polyethylene glycol  Family Communication: Son via telephone  Disposition: Status is: Inpatient Remains inpatient appropriate because: Acute metabolic cephalopathy, UTI     Time spent: 38  minutes  Author: Jodeane Mulligan, DO 12/19/2023 2:15 PM  For on call review [email or web address].

## 2023-12-20 DIAGNOSIS — N3000 Acute cystitis without hematuria: Secondary | ICD-10-CM | POA: Diagnosis not present

## 2023-12-20 DIAGNOSIS — R262 Difficulty in walking, not elsewhere classified: Secondary | ICD-10-CM | POA: Diagnosis not present

## 2023-12-20 DIAGNOSIS — I5033 Acute on chronic diastolic (congestive) heart failure: Secondary | ICD-10-CM | POA: Diagnosis not present

## 2023-12-20 LAB — BASIC METABOLIC PANEL WITH GFR
Anion gap: 12 (ref 5–15)
BUN: 32 mg/dL — ABNORMAL HIGH (ref 8–23)
CO2: 26 mmol/L (ref 22–32)
Calcium: 9.2 mg/dL (ref 8.9–10.3)
Chloride: 99 mmol/L (ref 98–111)
Creatinine, Ser: 1.08 mg/dL — ABNORMAL HIGH (ref 0.44–1.00)
GFR, Estimated: 49 mL/min — ABNORMAL LOW (ref >60)
Glucose, Bld: 92 mg/dL (ref 70–99)
Potassium: 3.7 mmol/L (ref 3.5–5.1)
Sodium: 137 mmol/L (ref 135–145)

## 2023-12-20 LAB — CBC
HCT: 39.6 % (ref 36.0–46.0)
Hemoglobin: 12.8 g/dL (ref 12.0–15.0)
MCH: 30.8 pg (ref 26.0–34.0)
MCHC: 32.3 g/dL (ref 30.0–36.0)
MCV: 95.2 fL (ref 80.0–100.0)
Platelets: 297 10*3/uL (ref 150–400)
RBC: 4.16 MIL/uL (ref 3.87–5.11)
RDW: 12.8 % (ref 11.5–15.5)
WBC: 4.9 10*3/uL (ref 4.0–10.5)
nRBC: 0 % (ref 0.0–0.2)

## 2023-12-20 LAB — MAGNESIUM: Magnesium: 2.3 mg/dL (ref 1.7–2.4)

## 2023-12-20 MED ORDER — OLANZAPINE 10 MG IM SOLR: 5.0000 mg | Freq: Four times a day (QID) | INTRAMUSCULAR | Status: DC | PRN

## 2023-12-20 NOTE — Progress Notes (Signed)
 Progress Note   Patient: Jill Snow ZOX:[id number] DOB: Oct 20, 1933 DOA: 12/17/2023     3 DOS: the patient was seen and examined on 12/20/2023   Brief hospital course:  89 y.o. female with medical history significant of PAF on Eliquis , advanced dementia, HLD, chronic HFpEF, recently diagnosed UTI, presented with worsening of generalized weakness.   Assessment and Plan:   Acute metabolic encephalopathy with underlying advanced dementia - Likely exacerbated by recurrent UTI.  Has significant sundowning as well.  Continue to attempt to reorient, as needed Zyprexa .  Appears to be showing improvement.  Very pleasant.   Recurrent cystitis -UA showing LE, WBCs, bacteria.  Likely contributing to encephalopathy and weakness.  Empiric ceftriaxone  on board.  Urine culture noting E. coli with resistance to ciprofloxacin.  IV fluids.  Patient may benefit from prophylactic antibiotic after treatment of current UTI.   Acute exacerbation of chronic HFpEF - Responded well to Lasix  IV, diuresing greater than 4 L.  Strict I's and O's.  Appears euvolemic.  Decreased diuretic to Lasix  40 mg IV daily.  Not hypoxic.  Continue to monitor closely.   Physical debilitation muscle weakness - Exacerbated by above.  PT/OT on board.  Per previous DC summary 4/25, previous PT/OT recommending home STR however patient family denied wanting to take patient home with home health.  Speaking with the son 5/4, reconsidering STR.  Will work closely with family, TOC on disposition planning.   Paroxysmal atrial fibrillation - Continue amiodarone , on Eliquis .   Subjective: Patient resting comfortably this morning.  A bit lethargic but rousable and answering questions.  Denies fever, shortness of breath, chest pain, nausea, vomiting, abdominal pain.  Physical Exam: Vitals:   12/19/23 0902 12/19/23 1932 12/20/23 0335 12/20/23 0908  BP: (!) 128/55 (!) 131/56 (!) 104/59 (!) 124/52  Pulse: (!) 58 70 (!) 58 63  Resp: 18 20  18    Temp: 98.2 F (36.8 C) 97.8 F (36.6 C) 97.9 F (36.6 C) (!) 97.5 F (36.4 C)  TempSrc:   Oral Oral  SpO2: 99% 98% 97% 96%  Weight:        GENERAL:  Alert, encephalopathic, frail HEENT:  EOMI CARDIOVASCULAR:  RRR, no murmurs appreciated RESPIRATORY:  Clear to auscultation, no wheezing, rales, or rhonchi GASTROINTESTINAL:  Soft, nontender, nondistended EXTREMITIES:  No LE edema bilaterally NEURO:  No new focal deficits appreciated SKIN:  No rashes noted PSYCH:  Appropriate mood and affect    Data Reviewed:  No new imaging to review  Labs: CBC: Recent Labs  Lab 12/17/23 0809 12/19/23 0733 12/20/23 0401  WBC 4.9 4.3 4.9  NEUTROABS 3.5  --   --   HGB 10.9* 12.5 12.8  HCT 34.1* 38.7 39.6  MCV 100.3* 97.2 95.2  PLT 234 257 297   Basic Metabolic Panel: Recent Labs  Lab 12/17/23 0809 12/18/23 0533 12/19/23 0733 12/20/23 0401  NA 140 142 141 137  K 4.7 3.8 3.7 3.7  CL 109 105 101 99  CO2 23 26 23 26   GLUCOSE 96 100* 85 92  BUN 17 23 32* 32*  CREATININE 1.22* 0.95 1.05* 1.08*  CALCIUM  9.1 9.0 9.3 9.2  MG 2.4  --   --  2.3   Liver Function Tests: Recent Labs  Lab 12/17/23 0809  AST 17  ALT 16  ALKPHOS 74  BILITOT 1.4*  PROT 6.9  ALBUMIN 3.3*   CBG: Recent Labs  Lab 12/17/23 0815  GLUCAP 103*    Scheduled Meds:  amiodarone   200  mg Oral BID   apixaban   5 mg Oral BID   atorvastatin   40 mg Oral Daily   diltiazem   120 mg Oral BID   furosemide   40 mg Intravenous Daily   montelukast   10 mg Oral Daily   pantoprazole   40 mg Oral Daily   potassium chloride  SA  20 mEq Oral Daily   QUEtiapine   50 mg Oral QHS   Continuous Infusions:  cefTRIAXone  (ROCEPHIN )  IV 2 g (12/20/23 0933)   PRN Meds:.acetaminophen , OLANZapine , ondansetron  **OR** ondansetron  (ZOFRAN ) IV, polyethylene glycol  Family Communication: None at bedside today  Disposition: Status is: Inpatient Remains inpatient appropriate because: UTI, CHF exacerbation, safe  disposition     Time spent: 34 minutes  Author: Jodeane Mulligan, DO 12/20/2023 12:22 PM  For on call review [email or web address].

## 2023-12-20 NOTE — Progress Notes (Signed)
 OT Cancellation Note  Patient Details Name: Jill Snow MRN: [id number] DOB: 17-May-1934   Cancelled Treatment:    Reason Eval/Treat Not Completed: Other (comment). Pt working with PT to begin ambulation into hallway. OT to re-attempt at later date/time.  Jarquez Mestre E Eilene Voigt 12/20/2023, 3:56 PM

## 2023-12-20 NOTE — Progress Notes (Signed)
 Physical Therapy Treatment Patient Details Name: Jill Snow MRN: [id number] DOB: 12-Sep-1933 Today's Date: 12/20/2023   History of Present Illness Jill Snow is an 89 y.o. female with history of dementia, chronic A-fib, hypertension who comes in with concerns for altered mental status and weakness. MD assessment includes: acute metabolic encephalopathy with underlying advanced dementia, recurrent cystitis, acute exacerbation of chronic HFpEF, and physical debilitation muscle weakness.    PT Comments  Pt was pleasantly confused but with some encouragement and cuing to initiate movement pt ultimately put forth good effort throughout the session.  Pt required extensive physical assistance and multi-modal cuing for sequencing with bed mobility tasks and once in unsupported sitting at the EOB presented with good static sitting balance.  Pt required cuing and min A during sit to/from stand transfer training from various surfaces and presented with fair stability upon coming to initial stand.  Pt was able to amb 1 x 60' and 1 x 40' with slow cadence but was generally steady with no overt LOB with min SOB after each walk that resolved quickly upon returning to sitting.  Pt's SpO2 and HR were both WNL on room air throughout the session.  Pt will benefit from continued PT services upon discharge to safely address deficits listed in patient problem list for decreased caregiver assistance and eventual return to PLOF.      If plan is discharge home, recommend the following: Supervision due to cognitive status;A little help with walking and/or transfers;A little help with bathing/dressing/bathroom;Assistance with cooking/housework;Direct supervision/assist for medications management;Assist for transportation;Help with stairs or ramp for entrance   Can travel by private vehicle     Yes  Equipment Recommendations  Other (comment) (TBD at next venue of care)    Recommendations for Other Services        Precautions / Restrictions Precautions Precautions: Fall Recall of Precautions/Restrictions: Impaired Restrictions Weight Bearing Restrictions Per Provider Order: No     Mobility  Bed Mobility Overal bed mobility: Needs Assistance Bed Mobility: Sit to Supine, Supine to Sit, Rolling Rolling: Mod assist   Supine to sit: Max assist, +2 for physical assistance Sit to supine: +2 for physical assistance, Max assist   General bed mobility comments: Max multi-modal cues to initiate movement and for sequencing with some but minimal active participation noted    Transfers Overall transfer level: Needs assistance Equipment used: Rolling walker (2 wheels) Transfers: Sit to/from Stand Sit to Stand: +2 safety/equipment, Min assist           General transfer comment: Multi-modal cues for hand placement and increased trunk flexion with assist ranging from min A to CGA during sit to/from stand training from multiple height surfaces    Ambulation/Gait Ambulation/Gait assistance: Contact guard assist, +2 safety/equipment, Min assist Gait Distance (Feet): 60 Feet x 1, 40 Feet x 1 Assistive device: Rolling walker (2 wheels) Gait Pattern/deviations: Step-through pattern, Decreased step length - right, Decreased step length - left, Trunk flexed Gait velocity: decreased     General Gait Details: Slow cadence with occasional min A to guide the RW but generally steady with no overt LOB; chair follow for pt safety secondary to impulsiveness   Stairs             Wheelchair Mobility     Tilt Bed    Modified Rankin (Stroke Patients Only)       Balance Overall balance assessment: Needs assistance Sitting-balance support: Feet supported Sitting balance-Leahy Scale: Fair     Standing balance  support: Bilateral upper extremity supported, During functional activity, Reliant on assistive device for balance Standing balance-Leahy Scale: Fair                               Hotel manager: Impaired Factors Affecting Communication: Difficulty expressing self;Reduced clarity of speech  Cognition Arousal: Alert Behavior During Therapy: WFL for tasks assessed/performed   PT - Cognitive impairments: History of cognitive impairments, No family/caregiver present to determine baseline                         Following commands: Impaired Following commands impaired: Follows one step commands inconsistently    Cueing Cueing Techniques: Verbal cues, Tactile cues, Visual cues  Exercises Other Exercises Other Exercises: log roll training for decreasaed caregiver assistance    General Comments        Pertinent Vitals/Pain Pain Assessment Pain Assessment: No/denies pain    Home Living                          Prior Function            PT Goals (current goals can now be found in the care plan section) Progress towards PT goals: Progressing toward goals    Frequency    Min 2X/week      PT Plan      Co-evaluation              AM-PAC PT "6 Clicks" Mobility   Outcome Measure  Help needed turning from your back to your side while in a flat bed without using bedrails?: A Lot Help needed moving from lying on your back to sitting on the side of a flat bed without using bedrails?: Total Help needed moving to and from a bed to a chair (including a wheelchair)?: Total Help needed standing up from a chair using your arms (e.g., wheelchair or bedside chair)?: A Little Help needed to walk in hospital room?: A Little Help needed climbing 3-5 steps with a railing? : A Lot 6 Click Score: 12    End of Session Equipment Utilized During Treatment: Gait belt Activity Tolerance: Patient tolerated treatment well Patient left: in bed;with call bell/phone within reach;with bed alarm set Nurse Communication: Mobility status PT Visit Diagnosis: Muscle weakness (generalized) (M62.81);Unsteadiness on feet  (R26.81);Difficulty in walking, not elsewhere classified (R26.2)     Time: 1610-9604 PT Time Calculation (min) (ACUTE ONLY): 23 min  Charges:    $Gait Training: 8-22 mins $Therapeutic Activity: 8-22 mins PT General Charges $$ ACUTE PT VISIT: 1 Visit                    D. Scott Disa Riedlinger PT, DPT 12/20/23, 3:14 PM

## 2023-12-20 NOTE — Plan of Care (Signed)

## 2023-12-20 NOTE — Plan of Care (Signed)

## 2023-12-20 NOTE — Progress Notes (Signed)
 Mobility Specialist - Progress Note   12/20/23 1100  Mobility  Activity Ambulated with assistance in hallway;Transferred from bed to chair;Transferred from chair to bed  Level of Assistance Contact guard assist, steadying assist  Assistive Device Front wheel walker  Distance Ambulated (ft) 95 ft  Activity Response Tolerated well  Mobility visit 1 Mobility     Pt lying in bed upon arrival, utilizing RA. Pt sleeping on arrival, awakened to wet wash cloth after failure to arouse by voice/sternal rub. Pt agreeable to activity. Completed bed mobility with maxA. STS x2 (bed/chair) with minA-CGA. Ambulated in hallway with CGA. Pt easily distracted in busy hallway and requires cueing to attend to task. VC to stay within RW BOS. Pt returned to chair for bed linen change prior to return to supine. Pt left in bed with alarm set, needs in reach.    Searcy Czech Mobility Specialist 12/20/23, 11:08 AM

## 2023-12-20 NOTE — Care Management Important Message (Signed)
 Important Message  Patient Details  Name: Jill Snow MRN: [id number] Date of Birth: Nov 30, 1933   Important Message Given:  Yes - Medicare IM     Anise Kerns 12/20/2023, 2:59 PM

## 2023-12-21 DIAGNOSIS — R262 Difficulty in walking, not elsewhere classified: Secondary | ICD-10-CM | POA: Diagnosis not present

## 2023-12-21 DIAGNOSIS — G9341 Metabolic encephalopathy: Secondary | ICD-10-CM | POA: Diagnosis not present

## 2023-12-21 LAB — BASIC METABOLIC PANEL WITH GFR
Anion gap: 12 (ref 5–15)
BUN: 33 mg/dL — ABNORMAL HIGH (ref 8–23)
CO2: 26 mmol/L (ref 22–32)
Calcium: 9.4 mg/dL (ref 8.9–10.3)
Chloride: 101 mmol/L (ref 98–111)
Creatinine, Ser: 0.88 mg/dL (ref 0.44–1.00)
GFR, Estimated: >60 mL/min (ref >60)
Glucose, Bld: 106 mg/dL — ABNORMAL HIGH (ref 70–99)
Potassium: 3.4 mmol/L — ABNORMAL LOW (ref 3.5–5.1)
Sodium: 139 mmol/L (ref 135–145)

## 2023-12-21 LAB — CBC
HCT: 40 % (ref 36.0–46.0)
Hemoglobin: 13.3 g/dL (ref 12.0–15.0)
MCH: 32 pg (ref 26.0–34.0)
MCHC: 33.3 g/dL (ref 30.0–36.0)
MCV: 96.2 fL (ref 80.0–100.0)
Platelets: 270 10*3/uL (ref 150–400)
RBC: 4.16 MIL/uL (ref 3.87–5.11)
RDW: 12.5 % (ref 11.5–15.5)
WBC: 4.1 10*3/uL (ref 4.0–10.5)
nRBC: 0 % (ref 0.0–0.2)

## 2023-12-21 LAB — MAGNESIUM: Magnesium: 2.5 mg/dL — ABNORMAL HIGH (ref 1.7–2.4)

## 2023-12-21 NOTE — Plan of Care (Signed)

## 2023-12-21 NOTE — Progress Notes (Signed)
 Mobility Specialist - Progress Note   12/21/23 1100  Mobility  Activity Refused mobility     Pt sleeping on arrival, and does not fully arouse or open eyes to stimuli. Mumbling words, but difficult to arouse enough for session. Will attempt another date/time as appropriate.    Searcy Czech Mobility Specialist 12/21/23, 11:01 AM

## 2023-12-21 NOTE — NC FL2 (Signed)
 [place]  MEDICAID FL2 LEVEL OF CARE FORM     IDENTIFICATION  Patient Name: Jill Snow Birthdate: [birth year] Sex: female Admission Date (Current Location): 12/17/2023  [facility] and [place] Number:  Chiropodist and Address:         Provider Number: [id number]  Attending Physician Name and Address:  Jodeane Mulligan, DO  Relative Name and Phone Number:       Current Level of Care: Hospital Recommended Level of Care: Skilled Nursing Facility Prior Approval Number:    Date Approved/Denied:   PASRR Number: [id number]  Discharge Plan: SNF    Current Diagnoses: Patient Active Problem List   Diagnosis Date Noted   Impaired ambulation 12/17/2023   Chronic atrial fibrillation with RVR (HCC) 12/10/2023   Weakness 12/08/2023   Encounter for general adult medical examination with abnormal findings 08/23/2023   Slurred speech 02/26/2023   Fall 02/10/2023   Thrombocytopenia (HCC) 01/05/2023   UTI (urinary tract infection) 12/25/2022   AMS (altered mental status) 12/25/2022   Venous insufficiency 11/10/2022   Cellulitis 03/23/2022   Encounter for dental examination and cleaning without abnormal findings 09/17/2021   Late onset Alzheimer's dementia with behavioral disturbance (HCC) 06/03/2021   Urinary urgency 05/26/2021   Tremor of right hand 05/26/2021   Dry eyes 12/24/2020   Chronic diastolic CHF (congestive heart failure) (HCC) 12/16/2020   Cellulitis of left arm 12/22/2019   History of non anemic vitamin B12 deficiency 06/27/2018   Aortic atherosclerosis (HCC) 04/06/2018   Allergic rhinitis 05/19/2017   Lung nodule 12/10/2016   Fatty liver 04/22/2016   Neuropathy 02/11/2016   Atrial flutter with rapid ventricular response (HCC)    Mitral regurgitation    Vitamin D  deficiency 08/29/2014   Obstructive sleep apnea on CPAP 03/07/2014   Atrial fibrillation with rapid ventricular response (HCC)    HTN (hypertension)    Hypercholesterolemia    RBBB  (right bundle branch block)     Orientation RESPIRATION BLADDER Height & Weight     Self  Normal   Weight: 74.1 kg Height:     BEHAVIORAL SYMPTOMS/MOOD NEUROLOGICAL BOWEL NUTRITION STATUS        Diet (Heart Healthy)  AMBULATORY STATUS COMMUNICATION OF NEEDS Skin   Limited Assist   Other (Comment) (skin tear)                       Personal Care Assistance Level of Assistance              Functional Limitations Info             SPECIAL CARE FACTORS FREQUENCY  PT (By licensed PT), OT (By licensed OT)                    Contractures Contractures Info: Not present    Additional Factors Info  Code Status, Allergies Code Status Info: DNR Allergies Info: Amoxicillin , Augmentin  (Amoxicillin -pot Clavulanate), Codeine, Crestor  (Rosuvastatin ), Penicillins           Current Medications (12/21/2023):  This is the current hospital active medication list Current Facility-Administered Medications  Medication Dose Route Frequency Provider Last Rate Last Admin   acetaminophen  (TYLENOL ) tablet 1,000 mg  1,000 mg Oral Q8H PRN Antoniette Batty T, MD   1,000 mg at 12/18/23 2137   amiodarone  (PACERONE ) tablet 200 mg  200 mg Oral BID Antoniette Batty T, MD   200 mg at 12/21/23 0853   apixaban  (ELIQUIS ) tablet 5 mg  5  mg Oral BID Antoniette Batty T, MD   5 mg at 12/21/23 4098   atorvastatin  (LIPITOR ) tablet 40 mg  40 mg Oral Daily Antoniette Batty T, MD   40 mg at 12/21/23 1191   cefTRIAXone  (ROCEPHIN ) 2 g in sodium chloride  0.9 % 100 mL IVPB  2 g Intravenous Q24H [place], MD   Stopped at 12/21/23 4782   diltiazem  (CARDIZEM  CD) 24 hr capsule 120 mg  120 mg Oral BID Belue, Nathan S, RPH   120 mg at 12/21/23 9562   furosemide  (LASIX ) injection 40 mg  40 mg Intravenous Daily Jodeane Mulligan, DO   40 mg at 12/21/23 1308   montelukast  (SINGULAIR ) tablet 10 mg  10 mg Oral Daily Antoniette Batty T, MD   10 mg at 12/21/23 6578   OLANZapine  (ZYPREXA ) injection 5 mg  5 mg Intramuscular Q6H PRN Pandora Bogaert W, DO       ondansetron  (ZOFRAN ) tablet 4 mg  4 mg Oral Q6H PRN Antoniette Batty T, MD       Or   ondansetron  (ZOFRAN ) injection 4 mg  4 mg Intravenous Q6H PRN Antoniette Batty T, MD       pantoprazole  (PROTONIX ) EC tablet 40 mg  40 mg Oral Daily Antoniette Batty T, MD   40 mg at 12/21/23 0853   polyethylene glycol (MIRALAX  / GLYCOLAX ) packet 17 g  17 g Oral Daily PRN [place], MD       potassium chloride  SA (KLOR-CON  M) CR tablet 20 mEq  20 mEq Oral Daily Antoniette Batty T, MD   20 mEq at 12/21/23 4696   QUEtiapine  (SEROQUEL ) tablet 50 mg  50 mg Oral QHS [place], MD   50 mg at 12/20/23 2121     Discharge Medications: Please see discharge summary for a list of discharge medications.  Relevant Imaging Results:  Relevant Lab Results:   Additional Information SSN: [phone]  Loman Risk, RN

## 2023-12-21 NOTE — TOC Initial Note (Signed)
 Transition of Care [facility]) - Initial/Assessment Note    Patient Details  Name: Jill Snow MRN: [id number] Date of Birth: 27-May-1934  Transition of Care [facility]) CM/SW Contact:    Loman Risk, RN Phone Number: 12/21/2023, 10:53 AM  Clinical Narrative:                   Patient A&O x1 Spoke with son bobby.    Admitted WUJ:[id number] encephalopathy with underlying advanced dementia  Admitted from: Patient lives at home with son with [name], and daughter stays with her during the day while [name] works   Therapy recommending SNF Son [name] would like to see what the options for SNF prior to making a decision about SNF vs home  Existing pasrr Fl2 sent for signature Bed search initiated         Patient Goals and CMS Choice            Expected Discharge Plan and Services                                              Prior Living Arrangements/Services                       Activities of Daily Living   ADL Screening (condition at time of admission) Is the patient deaf or have difficulty hearing?: No Does the patient have difficulty seeing, even when wearing glasses/contacts?: No Does the patient have difficulty concentrating, remembering, or making decisions?: Yes  Permission Sought/Granted                  Emotional Assessment              Admission diagnosis:  Weakness [R53.1] AKI (acute kidney injury) (HCC) [N17.9] Impaired ambulation [R26.2] Dementia without behavioral disturbance, psychotic disturbance, mood disturbance, or anxiety, unspecified dementia severity, unspecified dementia type (HCC) [F03.90] Patient Active Problem List   Diagnosis Date Noted   Impaired ambulation 12/17/2023   Chronic atrial fibrillation with RVR (HCC) 12/10/2023   Weakness 12/08/2023   Encounter for general adult medical examination with abnormal findings 08/23/2023   Slurred speech 02/26/2023   Fall 02/10/2023   Thrombocytopenia (HCC)  01/05/2023   UTI (urinary tract infection) 12/25/2022   AMS (altered mental status) 12/25/2022   Venous insufficiency 11/10/2022   Cellulitis 03/23/2022   Encounter for dental examination and cleaning without abnormal findings 09/17/2021   Late onset Alzheimer's dementia with behavioral disturbance (HCC) 06/03/2021   Urinary urgency 05/26/2021   Tremor of right hand 05/26/2021   Dry eyes 12/24/2020   Chronic diastolic CHF (congestive heart failure) (HCC) 12/16/2020   Cellulitis of left arm 12/22/2019   History of non anemic vitamin B12 deficiency 06/27/2018   Aortic atherosclerosis (HCC) 04/06/2018   Allergic rhinitis 05/19/2017   Lung nodule 12/10/2016   Fatty liver 04/22/2016   Neuropathy 02/11/2016   Atrial flutter with rapid ventricular response (HCC)    Mitral regurgitation    Vitamin D  deficiency 08/29/2014   Obstructive sleep apnea on CPAP 03/07/2014   Atrial fibrillation with rapid ventricular response (HCC)    HTN (hypertension)    Hypercholesterolemia    RBBB (right bundle branch block)    PCP:  Devorah Fonder, MD Pharmacy:   [facility] [phone] Barnie Bora, [place] - [address] [place] [address] Phone: [phone]  Fax: [phone]     Social Drivers of Health (SDOH) Social History: SDOH Screenings   Food Insecurity: Patient Unable To Answer (12/18/2023)  Housing: Patient Unable To Answer (12/18/2023)  Transportation Needs: Patient Unable To Answer (12/18/2023)  Utilities: Patient Unable To Answer (12/18/2023)  Depression (PHQ2-9): Low Risk  (11/10/2022)  Financial Resource Strain: Low Risk  (06/02/2021)  Physical Activity: Insufficiently Active (06/02/2021)  Social Connections: Patient Unable To Answer (12/18/2023)  Recent Concern: Social Connections - Socially Isolated (11/30/2023)  Stress: No Stress Concern Present (06/02/2021)  Tobacco Use: Low Risk  (12/17/2023)   SDOH Interventions:     Readmission Risk Interventions     No  data to display

## 2023-12-21 NOTE — Progress Notes (Signed)
 Occupational Therapy Treatment Patient Details Name: Jill Snow MRN: [id number] DOB: [birth year] Today's Date: 12/21/2023   History of present illness Jill Snow is an 89 y.o. female with history of dementia, chronic A-fib, hypertension who comes in with concerns for altered mental status and weakness. MD assessment includes: acute metabolic encephalopathy with underlying advanced dementia, recurrent cystitis, acute exacerbation of chronic HFpEF, and physical debilitation muscle weakness.   OT comments  Pt seen for OT treatment on this date. Upon arrival to room pt seated in bed with sitter at bedside, agreeable to tx. Pt requires intermittent multimodal cues throughout session to account to cognitive deficits. On this date pt was alert to name only. Pt got to EOB with MINA for LE management, able to STS from EOB with MODA. Initially requiring physical assistance for liftoff only. Pt amb into hallway with use of RW + MINA. Total amb with RW ~158ft. Pt returned to room, donned LB briefs able to pull onto thighs in sitting, required assistance for pulling up over her bottom. Pt returned to bed with all needs in reach, sitter attending to pt. Pt making good progress toward goals, will continue to follow POC. Discharge recommendation remains appropriate.        If plan is discharge home, recommend the following:  A lot of help with walking and/or transfers;A lot of help with bathing/dressing/bathroom;Help with stairs or ramp for entrance;Supervision due to cognitive status   Equipment Recommendations  BSC/3in1    Recommendations for Other Services      Precautions / Restrictions Precautions Precautions: Fall Recall of Precautions/Restrictions: Impaired Restrictions Weight Bearing Restrictions Per Provider Order: No       Mobility Bed Mobility Overal bed mobility: Needs Assistance Bed Mobility: Sit to Supine, Supine to Sit     Supine to sit: Min assist Sit to supine: Min  assist   General bed mobility comments: LE management only    Transfers Overall transfer level: Needs assistance Equipment used: Rolling walker (2 wheels) Transfers: Sit to/from Stand Sit to Stand: Min assist, Mod assist           General transfer comment: MODA initial STS from EOB, MIN/CGA on other multiple STS from EOB during session/dressing tasks     Balance Overall balance assessment: Needs assistance Sitting-balance support: Feet supported Sitting balance-Leahy Scale: Fair     Standing balance support: Bilateral upper extremity supported, During functional activity, Reliant on assistive device for balance Standing balance-Leahy Scale: Fair                             ADL either performed or assessed with clinical judgement   ADL Overall ADL's : Needs assistance/impaired                     Lower Body Dressing: Moderate assistance   Toilet Transfer: Contact guard assist;Cueing for sequencing;Cueing for safety;Rolling walker (2 wheels) (Simulated)   Toileting- Clothing Manipulation and Hygiene: Maximal assistance;Sit to/from stand       Functional mobility during ADLs: Rolling walker (2 wheels);Cueing for sequencing;Cueing for safety;Minimal assistance General ADL Comments: MINA for DME management and cognitive deficits    Extremity/Trunk Assessment              Vision       Perception     Praxis     Communication Communication Communication: Impaired Factors Affecting Communication: Difficulty expressing self;Reduced clarity of speech   Cognition Arousal:  Alert Behavior During Therapy: WFL for tasks assessed/performed Cognition: History of cognitive impairments, No family/caregiver present to determine baseline   Orientation impairments: Person, Place, Time, Situation (Unable to recall birthday) Awareness: Intellectual awareness impaired, Online awareness impaired Memory impairment (select all impairments): Short-term  memory Attention impairment (select first level of impairment): Focused attention Executive functioning impairment (select all impairments): Initiation, Organization, Sequencing, Reasoning, Problem solving OT - Cognition Comments: Garbled, tangential speech. Accurately states name only.                 Following commands: Impaired Following commands impaired: Follows one step commands inconsistently      Cueing   Cueing Techniques: Verbal cues, Tactile cues, Visual cues  Exercises Exercises: Other exercises Other Exercises Other Exercises: Edu: Sequencing, DME management throughout    Shoulder Instructions       General Comments      Pertinent Vitals/ Pain       Pain Assessment Pain Assessment: PAINAD Faces Pain Scale: No hurt Breathing: normal Negative Vocalization: none Facial Expression: smiling or inexpressive Body Language: relaxed Consolability: no need to console PAINAD Score: 0  Home Living                                          Prior Functioning/Environment              Frequency  Min 2X/week        Progress Toward Goals  OT Goals(current goals can now be found in the care plan section)  Progress towards OT goals: Progressing toward goals  Acute Rehab OT Goals Patient Stated Goal: to get better OT Goal Formulation: With patient Time For Goal Achievement: 12/31/23 Potential to Achieve Goals: Fair ADL Goals Pt Will Perform Grooming: with supervision;standing Pt Will Perform Lower Body Dressing: with min assist;sit to/from stand Pt Will Transfer to Toilet: with modified independence;ambulating;regular height toilet Pt Will Perform Toileting - Clothing Manipulation and hygiene: with mod assist;sit to/from stand  Plan      Co-evaluation                 AM-PAC OT "6 Clicks" Daily Activity     Outcome Measure   Help from another person eating meals?: A Little Help from another person taking care of personal  grooming?: A Little Help from another person toileting, which includes using toliet, bedpan, or urinal?: A Lot Help from another person bathing (including washing, rinsing, drying)?: A Lot Help from another person to put on and taking off regular upper body clothing?: A Little Help from another person to put on and taking off regular lower body clothing?: A Lot 6 Click Score: 15    End of Session Equipment Utilized During Treatment: Rolling walker (2 wheels)  OT Visit Diagnosis: Unsteadiness on feet (R26.81);Other abnormalities of gait and mobility (R26.89);Repeated falls (R29.6);Muscle weakness (generalized) (M62.81)   Activity Tolerance Patient tolerated treatment well   Patient Left in bed;with call bell/phone within reach;with family/visitor present   Nurse Communication Mobility status        Time: 1191-4782 OT Time Calculation (min): 16 min  Charges: OT General Charges $OT Visit: 1 Visit OT Treatments $Therapeutic Activity: 8-22 mins  Rosaria Common M.S. OTR/L  12/21/23, 4:49 PM

## 2023-12-21 NOTE — Progress Notes (Signed)
 Progress Note   Patient: Jill Snow EXB:[id number] DOB: Aug 13, 1934 DOA: 12/17/2023     4 DOS: the patient was seen and examined on 12/21/2023   Brief hospital course:  89 y.o. female with medical history significant of PAF on Eliquis , advanced dementia, HLD, chronic HFpEF, recently diagnosed UTI, presented with worsening of generalized weakness.   Assessment and Plan:   Acute metabolic encephalopathy with underlying advanced dementia - Likely exacerbated by recurrent UTI.  Has intermittent sundowning as well.  Continue to attempt to reorient.  Appears to be showing improvement.  Very pleasant.  Scheduled Seroquel  at bedtime.  As needed Zyprexa .   Recurrent cystitis -UA showing LE, WBCs, bacteria.  Likely contributing to encephalopathy and weakness.  Empiric ceftriaxone  on board.  Urine culture noting E. coli with resistance to ciprofloxacin.  IV fluids.  Patient may benefit from prophylactic antibiotic after treatment of recurrent UTI.   Acute exacerbation of chronic HFpEF - Responded well to Lasix  IV, diuresing greater than 4 L.  Strict I's and O's.  Appears euvolemic.  Decreased diuretic to Lasix  40 mg IV daily.  Not hypoxic.  Continue to monitor closely.   Physical debilitation muscle weakness - Exacerbated by above.  PT/OT on board.  Per previous DC summary 4/25, previous PT/OT recommending home STR however patient family denied wanting to take patient home with home health.  Speaking with the son 5/4, reconsidering STR.  Will work closely with family, TOC on disposition planning.  Currently awaiting placement.   Paroxysmal atrial fibrillation - Continue amiodarone , on Eliquis .   Subjective: Patient sitting up in bed, comfortable this morning.  Mildly confused but otherwise pleasant.  Denies any pain, shortness of breath, nausea, vomiting.  Breathing well on room air.  Urine output excellent.  Physical Exam: Vitals:   12/20/23 1743 12/20/23 1927 12/21/23 0320 12/21/23 0914   BP: 125/74 133/73 128/60 (!) 120/54  Pulse: 61 69 62 60  Resp: 18 16 18 16   Temp:  97.6 F (36.4 C) (!) 97.5 F (36.4 C)   TempSrc:  Oral    SpO2: 98% 95% 91% 97%  Weight:        GENERAL:  Alert, encephalopathic, frail HEENT:  EOMI CARDIOVASCULAR:  RRR, no murmurs appreciated RESPIRATORY:  Clear to auscultation, no wheezing, rales, or rhonchi GASTROINTESTINAL:  Soft, nontender, nondistended EXTREMITIES:  No LE edema bilaterally NEURO:  No new focal deficits appreciated SKIN:  No rashes noted PSYCH:  Appropriate mood and affect    Data Reviewed:  No new imaging to review  Labs: CBC: Recent Labs  Lab 12/17/23 0809 12/19/23 0733 12/20/23 0401 12/21/23 0454  WBC 4.9 4.3 4.9 4.1  NEUTROABS 3.5  --   --   --   HGB 10.9* 12.5 12.8 13.3  HCT 34.1* 38.7 39.6 40.0  MCV 100.3* 97.2 95.2 96.2  PLT 234 257 297 270   Basic Metabolic Panel: Recent Labs  Lab 12/17/23 0809 12/18/23 0533 12/19/23 0733 12/20/23 0401 12/21/23 0454  NA 140 142 141 137 139  K 4.7 3.8 3.7 3.7 3.4*  CL 109 105 101 99 101  CO2 23 26 23 26 26   GLUCOSE 96 100* 85 92 106*  BUN 17 23 32* 32* 33*  CREATININE 1.22* 0.95 1.05* 1.08* 0.88  CALCIUM  9.1 9.0 9.3 9.2 9.4  MG 2.4  --   --  2.3 2.5*   Liver Function Tests: Recent Labs  Lab 12/17/23 0809  AST 17  ALT 16  ALKPHOS 74  BILITOT 1.4*  PROT 6.9  ALBUMIN 3.3*   CBG: Recent Labs  Lab 12/17/23 0815  GLUCAP 103*    Scheduled Meds:  amiodarone   200 mg Oral BID   apixaban   5 mg Oral BID   atorvastatin   40 mg Oral Daily   diltiazem   120 mg Oral BID   furosemide   40 mg Intravenous Daily   montelukast   10 mg Oral Daily   pantoprazole   40 mg Oral Daily   potassium chloride  SA  20 mEq Oral Daily   QUEtiapine   50 mg Oral QHS   Continuous Infusions:  cefTRIAXone  (ROCEPHIN )  IV Stopped (12/21/23 0927)   PRN Meds:.acetaminophen , OLANZapine , ondansetron  **OR** ondansetron  (ZOFRAN ) IV, polyethylene glycol  Family Communication: None  at bedside  Disposition: Status is: Inpatient Remains inpatient appropriate because: Acute metabolic encephalopathy, safe disposition to STR.     Time spent: 27 minutes  Author: Jodeane Mulligan, DO 12/21/2023 11:56 AM  For on call review [email or web address].

## 2023-12-22 DIAGNOSIS — I482 Chronic atrial fibrillation, unspecified: Secondary | ICD-10-CM

## 2023-12-22 DIAGNOSIS — I5032 Chronic diastolic (congestive) heart failure: Secondary | ICD-10-CM | POA: Diagnosis not present

## 2023-12-22 DIAGNOSIS — R262 Difficulty in walking, not elsewhere classified: Secondary | ICD-10-CM | POA: Diagnosis not present

## 2023-12-22 DIAGNOSIS — N3000 Acute cystitis without hematuria: Secondary | ICD-10-CM | POA: Diagnosis not present

## 2023-12-22 DIAGNOSIS — G9341 Metabolic encephalopathy: Secondary | ICD-10-CM | POA: Diagnosis not present

## 2023-12-22 DIAGNOSIS — F02818 Dementia in other diseases classified elsewhere, unspecified severity, with other behavioral disturbance: Secondary | ICD-10-CM

## 2023-12-22 DIAGNOSIS — G301 Alzheimer's disease with late onset: Secondary | ICD-10-CM

## 2023-12-22 LAB — CBC
HCT: 39.8 % (ref 36.0–46.0)
Hemoglobin: 13.1 g/dL (ref 12.0–15.0)
MCH: 31.7 pg (ref 26.0–34.0)
MCHC: 32.9 g/dL (ref 30.0–36.0)
MCV: 96.4 fL (ref 80.0–100.0)
Platelets: 275 10*3/uL (ref 150–400)
RBC: 4.13 MIL/uL (ref 3.87–5.11)
RDW: 12.6 % (ref 11.5–15.5)
WBC: 5.6 10*3/uL (ref 4.0–10.5)
nRBC: 0 % (ref 0.0–0.2)

## 2023-12-22 LAB — BASIC METABOLIC PANEL WITH GFR
Anion gap: 11 (ref 5–15)
BUN: 39 mg/dL — ABNORMAL HIGH (ref 8–23)
CO2: 27 mmol/L (ref 22–32)
Calcium: 9.5 mg/dL (ref 8.9–10.3)
Chloride: 103 mmol/L (ref 98–111)
Creatinine, Ser: 1.11 mg/dL — ABNORMAL HIGH (ref 0.44–1.00)
GFR, Estimated: 48 mL/min — ABNORMAL LOW
Glucose, Bld: 102 mg/dL — ABNORMAL HIGH (ref 70–99)
Potassium: 4 mmol/L (ref 3.5–5.1)
Sodium: 141 mmol/L (ref 135–145)

## 2023-12-22 LAB — MAGNESIUM: Magnesium: 2.5 mg/dL — ABNORMAL HIGH (ref 1.7–2.4)

## 2023-12-22 NOTE — Assessment & Plan Note (Signed)
 12-18-2023 through 12-21-2023 Likely exacerbated by recurrent UTI.  Has intermittent sundowning as well.  Continue to attempt to reorient.  Appears to be showing improvement.  Very pleasant.  Scheduled Seroquel  at bedtime.  As needed Zyprexa .

## 2023-12-22 NOTE — Hospital Course (Signed)
 HPI: Jill Snow is a 89 y.o. female with medical history significant of PAF on Eliquis , advanced dementia, HLD, chronic HFpEF, recently diagnosed UTI, presented with worsening of generalized weakness.   Patient is demented and unable to provide any history, all history provided by son at bedside.  Son reported that patient was recently hospitalized for UTI process sent home with p.o. antibiotics which patient completed 3 to 4 days ago.  Since discharge home, patient has been very weak and unsteady on her feet, patient however not using any support including cane/crutches/roller walker.  Has had several near falls and yesterday, son send the patient was discharged home daytime and sister reported that the patient has been more wobbly and last night, patient refused to get out of bed and send trial of helper stand up and found her legs were very weak and swollen.  Significant Events: Admitted 12/17/2023 for impaired ambulation   Significant Labs: UA moderate LE, Neg nitrite, WBC >50, many bacteria Urine Cx E. Coli WBC 4.9, HgB 10.9, plt 234 Na 140, K 4.7, CO2 of 23, BUN 17, scr 1.22, glu 96 TSH 0.954 NH3 of 21  Significant Imaging Studies: CT head, CT c-spine No acute intracranial abnormality. 2. No acute osseous injury or traumatic listhesis of the cervical spine. CT renal stone No nephroureterolithiasis or obstructive uropathy. 2. No acute inflammatory process identified within the abdomen or pelvis. 3. Atelectatic changes in the bilateral lung lower lobes with associated trace bilateral pleural effusions. No mass or consolidation.   Antibiotic Therapy: Anti-infectives (From admission, onward)    Start     Dose/Rate Route Frequency Ordered Stop   12/18/23 1000  cefTRIAXone  (ROCEPHIN ) 2 g in sodium chloride  0.9 % 100 mL IVPB  Status:  Discontinued        2 g 200 mL/hr over 30 Minutes Intravenous Every 24 hours 12/17/23 1200 12/21/23 1206   12/17/23 0945  cefTRIAXone  (ROCEPHIN ) 1 g  in sodium chloride  0.9 % 100 mL IVPB        1 g 200 mL/hr over 30 Minutes Intravenous  Once 12/17/23 0945 12/17/23 1020       Procedures:   Consultants:

## 2023-12-22 NOTE — Plan of Care (Signed)
  Problem: Activity: Goal: Risk for activity intolerance will decrease Outcome: Progressing   Problem: Nutrition: Goal: Adequate nutrition will be maintained Outcome: Progressing   Problem: Pain Managment: Goal: General experience of comfort will improve and/or be controlled Outcome: Progressing   Problem: Skin Integrity: Goal: Risk for impaired skin integrity will decrease Outcome: Progressing

## 2023-12-22 NOTE — Assessment & Plan Note (Signed)
 12-18-2023 through 12-21-2023 on cardizem , eliquis , amio.

## 2023-12-22 NOTE — Telephone Encounter (Signed)
 Called patient and left message for call back.

## 2023-12-22 NOTE — Subjective & Objective (Signed)
 Patient seen and examined.  She is getting back into bed with therapy.  She walked 160 feet with the mobility technician.  Case management states that the family is intent on bringing her back home.  A declined to place her in the facility.  Patient stable for discharge.

## 2023-12-22 NOTE — Progress Notes (Signed)
 PROGRESS NOTE    Jill Snow  WUJ:[id number] DOB: Jan 25, 1934 DOA: 12/17/2023 PCP: Devorah Fonder, MD  Subjective: Patient seen and examined.  She is getting back into bed with therapy.  She walked 160 feet with the mobility technician.  Case management states that the family is intent on bringing her back home.  A declined to place her in the facility.  Patient stable for discharge.   Hospital Course: HPI: Jill Snow is a 89 y.o. female with medical history significant of PAF on Eliquis , advanced dementia, HLD, chronic HFpEF, recently diagnosed UTI, presented with worsening of generalized weakness.   Patient is demented and unable to provide any history, all history provided by son at bedside.  Son reported that patient was recently hospitalized for UTI process sent home with p.o. antibiotics which patient completed 3 to 4 days ago.  Since discharge home, patient has been very weak and unsteady on her feet, patient however not using any support including cane/crutches/roller walker.  Has had several near falls and yesterday, son send the patient was discharged home daytime and sister reported that the patient has been more wobbly and last night, patient refused to get out of bed and send trial of helper stand up and found her legs were very weak and swollen.  Significant Events: Admitted 12/17/2023 for impaired ambulation   Significant Labs: UA moderate LE, Neg nitrite, WBC >50, many bacteria Urine Cx E. Coli WBC 4.9, HgB 10.9, plt 234 Na 140, K 4.7, CO2 of 23, BUN 17, scr 1.22, glu 96 TSH 0.954 NH3 of 21  Significant Imaging Studies: CT head, CT c-spine No acute intracranial abnormality. 2. No acute osseous injury or traumatic listhesis of the cervical spine. CT renal stone No nephroureterolithiasis or obstructive uropathy. 2. No acute inflammatory process identified within the abdomen or pelvis. 3. Atelectatic changes in the bilateral lung lower lobes with associated  trace bilateral pleural effusions. No mass or consolidation.   Antibiotic Therapy: Anti-infectives (From admission, onward)    Start     Dose/Rate Route Frequency Ordered Stop   12/18/23 1000  cefTRIAXone  (ROCEPHIN ) 2 g in sodium chloride  0.9 % 100 mL IVPB  Status:  Discontinued        2 g 200 mL/hr over 30 Minutes Intravenous Every 24 hours 12/17/23 1200 12/21/23 1206   12/17/23 0945  cefTRIAXone  (ROCEPHIN ) 1 g in sodium chloride  0.9 % 100 mL IVPB        1 g 200 mL/hr over 30 Minutes Intravenous  Once 12/17/23 0945 12/17/23 1020       Procedures:   Consultants:     Assessment and Plan: * Impaired ambulation 12-18-2023 through 12-21-2023 due to UTI, acute encephalopathy and acute CHF.  PT/OT on board.  Per previous DC summary 4/25, previous PT/OT recommending home STR however patient family denied wanting to take patient home with home health.  Speaking with the son 5/4, reconsidering STR.  Will work closely with family, TOC on disposition planning.  Currently awaiting placement.   12-22-2023 notified by CM that family declines SNF placement and wants to take pt home. Home PT/OT ordered.  Acute metabolic encephalopathy 12-18-2023 through 12-21-2023 Likely exacerbated by recurrent UTI.  Has intermittent sundowning as well.  Continue to attempt to reorient.  Appears to be showing improvement.  Very pleasant.  Scheduled Seroquel  at bedtime.  As needed Zyprexa .   UTI (urinary tract infection) 12-18-2023 through 12-21-2023 contributing to her acute metabolic encephalopathy.  E. Coli in urine cx.  Completed 5 days of IV Rocephin .  Chronic diastolic CHF (congestive heart failure) (HCC) 12-18-2023 through 12-21-2023 Acute exacerbation of chronic HFpEF - Responded well to Lasix  IV, diuresing greater than 4 L.  Strict I's and O's.  Appears euvolemic.  Decreased diuretic to Lasix  40 mg IV daily.  Not hypoxic.  Continue to monitor closely.  12-22-2023 stop IVF lasix . Scr up to 1.1  today.  Chronic a-fib (HCC) 12-18-2023 through 12-21-2023 on cardizem , eliquis , amio.  Late onset Alzheimer's dementia with behavioral disturbance (HCC) 12-22-2023 chronic. Family choosing to take pt home despite recommendation for SNF placement.   DVT prophylaxis:  apixaban  (ELIQUIS ) tablet 5 mg     Code Status: Limited: Do not attempt resuscitation (DNR) -DNR-LIMITED -Do Not Intubate/DNI  Family Communication: no family at bedside Disposition Plan: return home Reason for continuing need for hospitalization: stable for DC to home.  Objective: Vitals:   12/21/23 0914 12/21/23 1535 12/21/23 1908 12/22/23 0306  BP: (!) 120/54 (!) 122/52 (!) 105/56 (!) 113/57  Pulse: 60 (!) 58 61 (!) 58  Resp: 16 16 18 18   Temp:  (!) 97.5 F (36.4 C) 97.8 F (36.6 C) 97.7 F (36.5 C)  TempSrc:      SpO2: 97% 100% 98% 98%  Weight:        Intake/Output Summary (Last 24 hours) at 12/22/2023 1255 Last data filed at 12/22/2023 0500 Gross per 24 hour  Intake 0 ml  Output 700 ml  Net -700 ml   Filed Weights   12/17/23 0804  Weight: 74.1 kg    Examination:  Physical Exam Vitals and nursing note reviewed.  Constitutional:      General: She is not in acute distress.    Appearance: She is not toxic-appearing.     Comments: Pleasantly demented  HENT:     Head: Normocephalic and atraumatic.     Nose: Nose normal.  Cardiovascular:     Rate and Rhythm: Normal rate. Rhythm irregular.  Pulmonary:     Effort: Pulmonary effort is normal.     Breath sounds: Normal breath sounds.  Abdominal:     General: Bowel sounds are normal.     Palpations: Abdomen is soft.  Skin:    General: Skin is warm and dry.     Capillary Refill: Capillary refill takes less than 2 seconds.  Neurological:     Mental Status: She is disoriented.     Comments: Pleasantly demented     Data Reviewed: I have personally reviewed following labs and imaging studies  CBC: Recent Labs  Lab 12/17/23 0809 12/19/23 0733  12/20/23 0401 12/21/23 0454 12/22/23 0453  WBC 4.9 4.3 4.9 4.1 5.6  NEUTROABS 3.5  --   --   --   --   HGB 10.9* 12.5 12.8 13.3 13.1  HCT 34.1* 38.7 39.6 40.0 39.8  MCV 100.3* 97.2 95.2 96.2 96.4  PLT 234 257 297 270 275   Basic Metabolic Panel: Recent Labs  Lab 12/17/23 0809 12/18/23 0533 12/19/23 0733 12/20/23 0401 12/21/23 0454 12/22/23 0453  NA 140 142 141 137 139 141  K 4.7 3.8 3.7 3.7 3.4* 4.0  CL 109 105 101 99 101 103  CO2 23 26 23 26 26 27   GLUCOSE 96 100* 85 92 106* 102*  BUN 17 23 32* 32* 33* 39*  CREATININE 1.22* 0.95 1.05* 1.08* 0.88 1.11*  CALCIUM  9.1 9.0 9.3 9.2 9.4 9.5  MG 2.4  --   --  2.3 2.5* 2.5*   GFR:  Estimated Creatinine Clearance: 36.1 mL/min (A) (by C-G formula based on SCr of 1.11 mg/dL (H)). Liver Function Tests: Recent Labs  Lab 12/17/23 0809  AST 17  ALT 16  ALKPHOS 74  BILITOT 1.4*  PROT 6.9  ALBUMIN 3.3*    Recent Labs  Lab 12/17/23 0809  AMMONIA 21   BNP (last 3 results) Recent Labs    11/29/23 2000  BNP 95.1   CBG: Recent Labs  Lab 12/17/23 0815  GLUCAP 103*    Recent Results (from the past 240 hours)  Urine Culture     Status: Abnormal   Collection Time: 12/17/23  8:08 AM   Specimen: Urine, Clean Catch  Result Value Ref Range Status   Specimen Description   Final    URINE, CLEAN CATCH Performed at [facility], [address] [place], [place] [address]    Special Requests   Final    NONE Performed at [facility], [address] [place], [place] [address]    Culture >=100,000 COLONIES/mL ESCHERICHIA COLI (A)  Final   Report Status 12/19/2023 FINAL  Final   Organism ID, Bacteria ESCHERICHIA COLI (A)  Final      Susceptibility   Escherichia coli - MIC*    AMPICILLIN 8 SENSITIVE Sensitive     CEFAZOLIN <=4 SENSITIVE Sensitive     CEFEPIME  <=0.12 SENSITIVE Sensitive     CEFTRIAXONE  <=0.25 SENSITIVE Sensitive     CIPROFLOXACIN >=4 RESISTANT Resistant     GENTAMICIN <=1 SENSITIVE  Sensitive     IMIPENEM <=0.25 SENSITIVE Sensitive     NITROFURANTOIN <=16 SENSITIVE Sensitive     TRIMETH /SULFA  <=20 SENSITIVE Sensitive     AMPICILLIN/SULBACTAM 4 SENSITIVE Sensitive     PIP/TAZO <=4 SENSITIVE Sensitive ug/mL    * >=100,000 COLONIES/mL ESCHERICHIA COLI  Resp panel by RT-PCR (RSV, Flu A&B, Covid) Anterior Nasal Swab     Status: None   Collection Time: 12/17/23  8:09 AM   Specimen: Anterior Nasal Swab  Result Value Ref Range Status   SARS Coronavirus 2 by RT PCR NEGATIVE NEGATIVE Final    Comment: (NOTE) SARS-CoV-2 target nucleic acids are NOT DETECTED.  The SARS-CoV-2 RNA is generally detectable in upper respiratory specimens during the acute phase of infection. The lowest concentration of SARS-CoV-2 viral copies this assay can detect is 138 copies/mL. A negative result does not preclude SARS-Cov-2 infection and should not be used as the sole basis for treatment or other patient management decisions. A negative result may occur with  improper specimen collection/handling, submission of specimen other than nasopharyngeal swab, presence of viral mutation(s) within the areas targeted by this assay, and inadequate number of viral copies(<138 copies/mL). A negative result must be combined with clinical observations, patient history, and epidemiological information. The expected result is Negative.  Fact Sheet for Patients:  BloggerCourse.com  Fact Sheet for Healthcare Providers:  SeriousBroker.it  This test is no t yet approved or cleared by the [place]  FDA and  has been authorized for detection and/or diagnosis of SARS-CoV-2 by FDA under an Emergency Use Authorization (EUA). This EUA will remain  in effect (meaning this test can be used) for the duration of the COVID-19 declaration under Section 564(b)(1) of the Act, 21 U.S.C.section 360bbb-3(b)(1), unless the authorization is terminated  or revoked sooner.        Influenza A by PCR NEGATIVE NEGATIVE Final   Influenza B by PCR NEGATIVE NEGATIVE Final    Comment: (NOTE) The Xpert Xpress SARS-CoV-2/FLU/RSV plus assay  is intended as an aid in the diagnosis of influenza from Nasopharyngeal swab specimens and should not be used as a sole basis for treatment. Nasal washings and aspirates are unacceptable for Xpert Xpress SARS-CoV-2/FLU/RSV testing.  Fact Sheet for Patients: BloggerCourse.com  Fact Sheet for Healthcare Providers: SeriousBroker.it  This test is not yet approved or cleared by the [place]  FDA and has been authorized for detection and/or diagnosis of SARS-CoV-2 by FDA under an Emergency Use Authorization (EUA). This EUA will remain in effect (meaning this test can be used) for the duration of the COVID-19 declaration under Section 564(b)(1) of the Act, 21 U.S.C. section 360bbb-3(b)(1), unless the authorization is terminated or revoked.     Resp Syncytial Virus by PCR NEGATIVE NEGATIVE Final    Comment: (NOTE) Fact Sheet for Patients: BloggerCourse.com  Fact Sheet for Healthcare Providers: SeriousBroker.it  This test is not yet approved or cleared by the [place]  FDA and has been authorized for detection and/or diagnosis of SARS-CoV-2 by FDA under an Emergency Use Authorization (EUA). This EUA will remain in effect (meaning this test can be used) for the duration of the COVID-19 declaration under Section 564(b)(1) of the Act, 21 U.S.C. section 360bbb-3(b)(1), unless the authorization is terminated or revoked.  Performed at [facility], [address], [place], [place] [address]      Radiology Studies: No results found.  Scheduled Meds:  amiodarone   200 mg Oral BID   apixaban   5 mg Oral BID   atorvastatin   40 mg Oral Daily   diltiazem   120 mg Oral BID   montelukast   10 mg Oral Daily    pantoprazole   40 mg Oral Daily   potassium chloride  SA  20 mEq Oral Daily   QUEtiapine   50 mg Oral QHS   Continuous Infusions:   LOS: 5 days   Time spent: 45 minutes  Unk Garb, DO  [facility]  12/22/2023, 12:55 PM

## 2023-12-22 NOTE — Assessment & Plan Note (Signed)
 12-18-2023 through 12-21-2023 due to UTI, acute encephalopathy and acute CHF.  PT/OT on board.  Per previous DC summary 4/25, previous PT/OT recommending home STR however patient family denied wanting to take patient home with home health.  Speaking with the son 5/4, reconsidering STR.  Will work closely with family, TOC on disposition planning.  Currently awaiting placement.   12-22-2023 notified by CM that family declines SNF placement and wants to take pt home. Home PT/OT ordered.

## 2023-12-22 NOTE — Assessment & Plan Note (Signed)
 12-18-2023 through 12-21-2023 contributing to her acute metabolic encephalopathy.  E. Coli in urine cx. Completed 5 days of IV Rocephin .

## 2023-12-22 NOTE — TOC Transition Note (Signed)
 Transition of Care [facility]) - Discharge Note   Patient Details  Name: Jill Snow MRN: [id number] Date of Birth: 1934-04-17  Transition of [facility]) CM/SW Contact:  Loman Risk, RN Phone Number: 12/22/2023, 1:25 PM   Clinical Narrative:     Patient to discharge today Randel Buss with [facility] notified of discharge Son [name] to transport after 7pm Per [name] no DME needs at discharge, and per MD no medication changes  Final next level of care: Home w Home Health Services Barriers to Discharge: Barriers Resolved   Patient Goals and CMS Choice            Discharge Placement                       Discharge Plan and Services Additional resources added to the After Visit Summary for                                       Social Drivers of Health (SDOH) Interventions SDOH Screenings   Food Insecurity: Patient Unable To Answer (12/18/2023)  Housing: Patient Unable To Answer (12/18/2023)  Transportation Needs: Patient Unable To Answer (12/18/2023)  Utilities: Patient Unable To Answer (12/18/2023)  Depression (PHQ2-9): Low Risk  (11/10/2022)  Financial Resource Strain: Low Risk  (06/02/2021)  Physical Activity: Insufficiently Active (06/02/2021)  Social Connections: Patient Unable To Answer (12/18/2023)  Recent Concern: Social Connections - Socially Isolated (11/30/2023)  Stress: No Stress Concern Present (06/02/2021)  Tobacco Use: Low Risk  (12/17/2023)     Readmission Risk Interventions     No data to display

## 2023-12-22 NOTE — Discharge Summary (Signed)
 [facility] Physician Discharge Summary   Patient name: Jill Snow  Admit date:     12/17/2023  Discharge date: 12/22/2023  Attending Physician: Frank [place] [[id number]]  Discharge Physician: Unk Garb   PCP: Gollan, Timothy J, MD  Admitted From: Home  Disposition:  Home  Recommendations for Outpatient Follow-up:  Follow up with PCP in 1-2 weeks  Home Health:Yes. PT, OT, home health aide Equipment/Devices: None  Discharge Condition:Stable CODE STATUS:DNR/DNI Diet recommendation: Heart Healthy Fluid Restriction: None  Hospital Summary: HPI: Jill Snow is a 89 y.o. female with medical history significant of PAF on Eliquis , advanced dementia, HLD, chronic HFpEF, recently diagnosed UTI, presented with worsening of generalized weakness.   Patient is demented and unable to provide any history, all history provided by son at bedside.  Son reported that patient was recently hospitalized for UTI process sent home with p.o. antibiotics which patient completed 3 to 4 days ago.  Since discharge home, patient has been very weak and unsteady on her feet, patient however not using any support including cane/crutches/roller walker.  Has had several near falls and yesterday, son send the patient was discharged home daytime and sister reported that the patient has been more wobbly and last night, patient refused to get out of bed and send trial of helper stand up and found her legs were very weak and swollen.  Significant Events: Admitted 12/17/2023 for impaired ambulation   Significant Labs: UA moderate LE, Neg nitrite, WBC >50, many bacteria Urine Cx E. Coli WBC 4.9, HgB 10.9, plt 234 Na 140, K 4.7, CO2 of 23, BUN 17, scr 1.22, glu 96 TSH 0.954 NH3 of 21  Significant Imaging Studies: CT head, CT c-spine No acute intracranial abnormality. 2. No acute osseous injury or traumatic listhesis of the cervical spine. CT renal stone No nephroureterolithiasis or obstructive  uropathy. 2. No acute inflammatory process identified within the abdomen or pelvis. 3. Atelectatic changes in the bilateral lung lower lobes with associated trace bilateral pleural effusions. No mass or consolidation.   Antibiotic Therapy: Anti-infectives (From admission, onward)    Start     Dose/Rate Route Frequency Ordered Stop   12/18/23 1000  cefTRIAXone  (ROCEPHIN ) 2 g in sodium chloride  0.9 % 100 mL IVPB  Status:  Discontinued        2 g 200 mL/hr over 30 Minutes Intravenous Every 24 hours 12/17/23 1200 12/21/23 1206   12/17/23 0945  cefTRIAXone  (ROCEPHIN ) 1 g in sodium chloride  0.9 % 100 mL IVPB        1 g 200 mL/hr over 30 Minutes Intravenous  Once 12/17/23 0945 12/17/23 1020       Procedures:   Consultants:   Hospital Course by Problem: * Impaired ambulation 12-18-2023 through 12-21-2023 due to UTI, acute encephalopathy and acute CHF.  PT/OT on board.  Per previous DC summary 4/25, previous PT/OT recommending home STR however patient family denied wanting to take patient home with home health.  Speaking with the son 5/4, reconsidering STR.  Will work closely with family, TOC on disposition planning.  Currently awaiting placement.   12-22-2023 notified by CM that family declines SNF placement and wants to take pt home. Home PT/OT ordered.  Acute metabolic encephalopathy 12-18-2023 through 12-21-2023 Likely exacerbated by recurrent UTI.  Has intermittent sundowning as well.  Continue to attempt to reorient.  Appears to be showing improvement.  Very pleasant.  Scheduled Seroquel  at bedtime.  As needed Zyprexa .   UTI (urinary tract infection) 12-18-2023 through  12-21-2023 contributing to her acute metabolic encephalopathy.  E. Coli in urine cx. Completed 5 days of IV Rocephin .  Chronic diastolic CHF (congestive heart failure) (HCC) 12-18-2023 through 12-21-2023 Acute exacerbation of chronic HFpEF - Responded well to Lasix  IV, diuresing greater than 4 L.  Strict I's and O's.   Appears euvolemic.  Decreased diuretic to Lasix  40 mg IV daily.  Not hypoxic.  Continue to monitor closely.  12-22-2023 stop IVF lasix . Scr up to 1.1 today.  Chronic a-fib (HCC) 12-18-2023 through 12-21-2023 on cardizem , eliquis , amio.  Late onset Alzheimer's dementia with behavioral disturbance (HCC) 12-22-2023 chronic. Family choosing to take pt home despite recommendation for SNF placement.   Discharge Diagnoses:  Principal Problem:   Impaired ambulation Active Problems:   Chronic diastolic CHF (congestive heart failure) (HCC)   UTI (urinary tract infection)   Acute metabolic encephalopathy   Late onset Alzheimer's dementia with behavioral disturbance (HCC)   Chronic a-fib [facility])   Discharge Instructions  Discharge Instructions     Call MD for:  extreme fatigue   Complete by: As directed    Call MD for:  persistant dizziness or light-headedness   Complete by: As directed    Call MD for:  persistant nausea and vomiting   Complete by: As directed    Call MD for:  severe uncontrolled pain   Complete by: As directed    Call MD for:  temperature >100.4   Complete by: As directed    Diet - low sodium heart healthy   Complete by: As directed    Discharge instructions   Complete by: As directed    1. Follow up with your primary care provider in 1-2 weeks following discharge from hospital.   Increase activity slowly   Complete by: As directed       Allergies as of 12/22/2023       Reactions   Amoxicillin  Nausea Only   Augmentin  [amoxicillin -pot Clavulanate] Nausea And Vomiting   Codeine Anaphylaxis   Crestor  [rosuvastatin ] Other (See Comments)   Myalgias    Penicillins Other (See Comments)   Unknown reaction Tolerates cephalosporins         Medication List     TAKE these medications    acetaminophen  650 MG CR tablet Commonly known as: TYLENOL  Take 1,300 mg by mouth every 8 (eight) hours as needed for pain.   amiodarone  200 MG tablet Commonly known as:  PACERONE  Take 1 tablet (200 mg total) by mouth 2 (two) times daily.   apixaban  5 MG Tabs tablet Commonly known as: Eliquis  Take 1 tablet (5 mg total) by mouth 2 (two) times daily.   atorvastatin  40 MG tablet Commonly known as: LIPITOR  TAKE 1 TABLET BY MOUTH EVERY DAY   diltiazem  120 MG 24 hr capsule Commonly known as: CARDIZEM  CD Take 1 capsule (120 mg total) by mouth 2 (two) times daily.   montelukast  10 MG tablet Commonly known as: SINGULAIR  TAKE 1 TABLET BY MOUTH EVERY DAY   omeprazole  20 MG capsule Commonly known as: PRILOSEC Take 1 capsule (20 mg total) by mouth daily.   polyethylene glycol 17 g packet Commonly known as: MIRALAX  / GLYCOLAX  Take 17 g by mouth daily as needed for mild constipation.   potassium chloride  SA 20 MEQ tablet Commonly known as: Klor-Con  M20 Take 1 tablet (20 mEq total) by mouth daily. May take an extra tablet (20 Meq) daily as needed with extra lasix .   QUEtiapine  50 MG tablet Commonly known as: SEROQUEL  Take 1  tablet (50 mg total) by mouth at bedtime.   VITAMIN D -3 PO Take 1 capsule by mouth at bedtime.   VITAMIN E  PO Take 1 capsule by mouth daily.        Allergies  Allergen Reactions   Amoxicillin  Nausea Only   Augmentin  [Amoxicillin -Pot Clavulanate] Nausea And Vomiting   Codeine Anaphylaxis   Crestor  [Rosuvastatin ] Other (See Comments)    Myalgias     Penicillins Other (See Comments)    Unknown reaction Tolerates cephalosporins     Discharge Exam: Vitals:   12/21/23 1908 12/22/23 0306  BP: (!) 105/56 (!) 113/57  Pulse: 61 (!) 58  Resp: 18 18  Temp: 97.8 F (36.6 C) 97.7 F (36.5 C)  SpO2: 98% 98%    Physical Exam Vitals and nursing note reviewed.  Constitutional:      General: She is not in acute distress.    Appearance: She is not toxic-appearing or diaphoretic.     Comments: Chronically ill appearing  HENT:     Head: Normocephalic and atraumatic.     Nose: Nose normal.  Eyes:     General: No scleral  icterus. Cardiovascular:     Rate and Rhythm: Normal rate and regular rhythm.  Pulmonary:     Effort: Pulmonary effort is normal.     Breath sounds: Normal breath sounds.  Abdominal:     General: Bowel sounds are normal.     Palpations: Abdomen is soft.  Skin:    General: Skin is warm and dry.     Capillary Refill: Capillary refill takes less than 2 seconds.  Neurological:     Mental Status: She is disoriented.     Comments: Pleasantly demented     The results of significant diagnostics from this hospitalization (including imaging, microbiology, ancillary and laboratory) are listed below for reference.    Microbiology: Recent Results (from the past 240 hours)  Urine Culture     Status: Abnormal   Collection Time: 12/17/23  8:08 AM   Specimen: Urine, Clean Catch  Result Value Ref Range Status   Specimen Description   Final    URINE, CLEAN CATCH Performed at [facility], [address] [place], [place] [address]    Special Requests   Final    NONE Performed at [facility], [address] [place], [place] [address]    Culture >=100,000 COLONIES/mL ESCHERICHIA COLI (A)  Final   Report Status 12/19/2023 FINAL  Final   Organism ID, Bacteria ESCHERICHIA COLI (A)  Final      Susceptibility   Escherichia coli - MIC*    AMPICILLIN 8 SENSITIVE Sensitive     CEFAZOLIN <=4 SENSITIVE Sensitive     CEFEPIME  <=0.12 SENSITIVE Sensitive     CEFTRIAXONE  <=0.25 SENSITIVE Sensitive     CIPROFLOXACIN >=4 RESISTANT Resistant     GENTAMICIN <=1 SENSITIVE Sensitive     IMIPENEM <=0.25 SENSITIVE Sensitive     NITROFURANTOIN <=16 SENSITIVE Sensitive     TRIMETH /SULFA  <=20 SENSITIVE Sensitive     AMPICILLIN/SULBACTAM 4 SENSITIVE Sensitive     PIP/TAZO <=4 SENSITIVE Sensitive ug/mL    * >=100,000 COLONIES/mL ESCHERICHIA COLI  Resp panel by RT-PCR (RSV, Flu A&B, Covid) Anterior Nasal Swab     Status: None   Collection Time: 12/17/23  8:09 AM   Specimen: Anterior Nasal  Swab  Result Value Ref Range Status   SARS Coronavirus 2 by RT PCR NEGATIVE NEGATIVE Final    Comment: (NOTE) SARS-CoV-2 target nucleic acids are  NOT DETECTED.  The SARS-CoV-2 RNA is generally detectable in upper respiratory specimens during the acute phase of infection. The lowest concentration of SARS-CoV-2 viral copies this assay can detect is 138 copies/mL. A negative result does not preclude SARS-Cov-2 infection and should not be used as the sole basis for treatment or other patient management decisions. A negative result may occur with  improper specimen collection/handling, submission of specimen other than nasopharyngeal swab, presence of viral mutation(s) within the areas targeted by this assay, and inadequate number of viral copies(<138 copies/mL). A negative result must be combined with clinical observations, patient history, and epidemiological information. The expected result is Negative.  Fact Sheet for Patients:  BloggerCourse.com  Fact Sheet for Healthcare Providers:  SeriousBroker.it  This test is no t yet approved or cleared by the [place]  FDA and  has been authorized for detection and/or diagnosis of SARS-CoV-2 by FDA under an Emergency Use Authorization (EUA). This EUA will remain  in effect (meaning this test can be used) for the duration of the COVID-19 declaration under Section 564(b)(1) of the Act, 21 U.S.C.section 360bbb-3(b)(1), unless the authorization is terminated  or revoked sooner.       Influenza A by PCR NEGATIVE NEGATIVE Final   Influenza B by PCR NEGATIVE NEGATIVE Final    Comment: (NOTE) The Xpert Xpress SARS-CoV-2/FLU/RSV plus assay is intended as an aid in the diagnosis of influenza from Nasopharyngeal swab specimens and should not be used as a sole basis for treatment. Nasal washings and aspirates are unacceptable for Xpert Xpress SARS-CoV-2/FLU/RSV testing.  Fact Sheet for  Patients: BloggerCourse.com  Fact Sheet for Healthcare Providers: SeriousBroker.it  This test is not yet approved or cleared by the [place]  FDA and has been authorized for detection and/or diagnosis of SARS-CoV-2 by FDA under an Emergency Use Authorization (EUA). This EUA will remain in effect (meaning this test can be used) for the duration of the COVID-19 declaration under Section 564(b)(1) of the Act, 21 U.S.C. section 360bbb-3(b)(1), unless the authorization is terminated or revoked.     Resp Syncytial Virus by PCR NEGATIVE NEGATIVE Final    Comment: (NOTE) Fact Sheet for Patients: BloggerCourse.com  Fact Sheet for Healthcare Providers: SeriousBroker.it  This test is not yet approved or cleared by the [place]  FDA and has been authorized for detection and/or diagnosis of SARS-CoV-2 by FDA under an Emergency Use Authorization (EUA). This EUA will remain in effect (meaning this test can be used) for the duration of the COVID-19 declaration under Section 564(b)(1) of the Act, 21 U.S.C. section 360bbb-3(b)(1), unless the authorization is terminated or revoked.  Performed at [facility], [address] [place], [place] [address]      Labs: BNP (last 3 results) Recent Labs    11/29/23 2000  BNP 95.1   Basic Metabolic Panel: Recent Labs  Lab 12/17/23 0809 12/18/23 0533 12/19/23 0733 12/20/23 0401 12/21/23 0454 12/22/23 0453  NA 140 142 141 137 139 141  K 4.7 3.8 3.7 3.7 3.4* 4.0  CL 109 105 101 99 101 103  CO2 23 26 23 26 26 27   GLUCOSE 96 100* 85 92 106* 102*  BUN 17 23 32* 32* 33* 39*  CREATININE 1.22* 0.95 1.05* 1.08* 0.88 1.11*  CALCIUM  9.1 9.0 9.3 9.2 9.4 9.5  MG 2.4  --   --  2.3 2.5* 2.5*   Liver Function Tests: Recent Labs  Lab 12/17/23 0809  AST 17  ALT 16  ALKPHOS 74  BILITOT 1.4*  PROT 6.9  ALBUMIN 3.3*    Recent  Labs  Lab 12/17/23 0809  AMMONIA 21   CBC: Recent Labs  Lab 12/17/23 0809 12/19/23 0733 12/20/23 0401 12/21/23 0454 12/22/23 0453  WBC 4.9 4.3 4.9 4.1 5.6  NEUTROABS 3.5  --   --   --   --   HGB 10.9* 12.5 12.8 13.3 13.1  HCT 34.1* 38.7 39.6 40.0 39.8  MCV 100.3* 97.2 95.2 96.2 96.4  PLT 234 257 297 270 275   CBG: Recent Labs  Lab 12/17/23 0815  GLUCAP 103*   Urinalysis    Component Value Date/Time   COLORURINE AMBER (A) 12/17/2023 0808   APPEARANCEUR CLOUDY (A) 12/17/2023 0808   LABSPEC 1.019 12/17/2023 0808   PHURINE 5.0 12/17/2023 0808   GLUCOSEU NEGATIVE 12/17/2023 0808   HGBUR NEGATIVE 12/17/2023 0808   BILIRUBINUR NEGATIVE 12/17/2023 0808   PROTEINUR NEGATIVE 12/17/2023 0808   NITRITE NEGATIVE 12/17/2023 0808   LEUKOCYTESUR MODERATE (A) 12/17/2023 0808   Sepsis Labs Recent Labs  Lab 12/19/23 0733 12/20/23 0401 12/21/23 0454 12/22/23 0453  WBC 4.3 4.9 4.1 5.6    Procedures/Studies: CT HEAD WO CONTRAST ( ) Result Date: 12/17/2023 CLINICAL DATA:  Head trauma, minor (Age >= 65y); Neck trauma (Age >= 65y) EXAM: CT HEAD WITHOUT CONTRAST CT CERVICAL SPINE WITHOUT CONTRAST TECHNIQUE: Multidetector CT imaging of the head and cervical spine was performed following the standard protocol without intravenous contrast. Multiplanar CT image reconstructions of the cervical spine were also generated. RADIATION DOSE REDUCTION: This exam was performed according to the departmental dose-optimization program which includes automated exposure control, adjustment of the mA and/or kV according to patient size and/or use of iterative reconstruction technique. COMPARISON:  CT scan head from 11/29/2023 and CT scan head and cervical spine from 02/10/2023. FINDINGS: CT HEAD FINDINGS Brain: No evidence of acute infarction, hemorrhage, hydrocephalus, extra-axial collection or mass lesion/mass effect. There is bilateral periventricular hypodensity, which is non-specific but most likely  seen in the settings of microvascular ischemic changes. Mild in extent. Otherwise normal appearance of brain parenchyma. Cerebral volume loss with enlargement of the ventricles. Vascular: No hyperdense vessel or unexpected calcification. Intracranial arteriosclerosis. Skull: Normal. Negative for fracture or focal lesion. Sinuses/Orbits: No acute finding. Asymmetrically thickened wall of visualized small portion of right maxillary sinus may suggest sequela of chronic sinusitis. Other: Visualized mastoid air cells are unremarkable. No mastoid effusion. CT CERVICAL SPINE FINDINGS Alignment: There is reversal of cervical lordosis. There is minimal/grade 1 anterolisthesis of C3 over C4 and C4 over C5. There is minimal retrolisthesis of C5 over C6. These are likely degenerative. Please note, this examination does not assess for ligamentous injury or stability. Skull base and vertebrae: No acute fracture. No primary bone lesion or focal pathologic process. Soft tissues and spinal canal: No prevertebral fluid or swelling. No visible canal hematoma. Disc levels: Moderate multilevel degenerative changes characterized by reduced intervertebral disc height, facet arthropathy and marginal osteophyte formation. Upper chest: Negative. Other: Asymmetrically enlarged left thyroid  lobe secondary to at least 2.1 x 3.1 cm hypoattenuating nodule, which is incompletely imaged on the current exam. This is incompletely characterized on the current exam and appears grossly similar to the prior CT scan. Please refer to prior thyroid  ultrasound and biopsy from year 2018 for details. IMPRESSION: 1. No acute intracranial abnormality. 2. No acute osseous injury or traumatic listhesis of the cervical spine. 3. Multiple other nonacute observations, as described above. Electronically Signed   By: Beula Brunswick M.D.   On:  12/17/2023 09:33   CT Cervical Spine Wo Contrast Result Date: 12/17/2023 CLINICAL DATA:  Head trauma, minor (Age >= 65y); Neck  trauma (Age >= 65y) EXAM: CT HEAD WITHOUT CONTRAST CT CERVICAL SPINE WITHOUT CONTRAST TECHNIQUE: Multidetector CT imaging of the head and cervical spine was performed following the standard protocol without intravenous contrast. Multiplanar CT image reconstructions of the cervical spine were also generated. RADIATION DOSE REDUCTION: This exam was performed according to the departmental dose-optimization program which includes automated exposure control, adjustment of the mA and/or kV according to patient size and/or use of iterative reconstruction technique. COMPARISON:  CT scan head from 11/29/2023 and CT scan head and cervical spine from 02/10/2023. FINDINGS: CT HEAD FINDINGS Brain: No evidence of acute infarction, hemorrhage, hydrocephalus, extra-axial collection or mass lesion/mass effect. There is bilateral periventricular hypodensity, which is non-specific but most likely seen in the settings of microvascular ischemic changes. Mild in extent. Otherwise normal appearance of brain parenchyma. Cerebral volume loss with enlargement of the ventricles. Vascular: No hyperdense vessel or unexpected calcification. Intracranial arteriosclerosis. Skull: Normal. Negative for fracture or focal lesion. Sinuses/Orbits: No acute finding. Asymmetrically thickened wall of visualized small portion of right maxillary sinus may suggest sequela of chronic sinusitis. Other: Visualized mastoid air cells are unremarkable. No mastoid effusion. CT CERVICAL SPINE FINDINGS Alignment: There is reversal of cervical lordosis. There is minimal/grade 1 anterolisthesis of C3 over C4 and C4 over C5. There is minimal retrolisthesis of C5 over C6. These are likely degenerative. Please note, this examination does not assess for ligamentous injury or stability. Skull base and vertebrae: No acute fracture. No primary bone lesion or focal pathologic process. Soft tissues and spinal canal: No prevertebral fluid or swelling. No visible canal hematoma.  Disc levels: Moderate multilevel degenerative changes characterized by reduced intervertebral disc height, facet arthropathy and marginal osteophyte formation. Upper chest: Negative. Other: Asymmetrically enlarged left thyroid  lobe secondary to at least 2.1 x 3.1 cm hypoattenuating nodule, which is incompletely imaged on the current exam. This is incompletely characterized on the current exam and appears grossly similar to the prior CT scan. Please refer to prior thyroid  ultrasound and biopsy from year 2018 for details. IMPRESSION: 1. No acute intracranial abnormality. 2. No acute osseous injury or traumatic listhesis of the cervical spine. 3. Multiple other nonacute observations, as described above. Electronically Signed   By: Beula Brunswick M.D.   On: 12/17/2023 09:33   CT Renal Stone Study Result Date: 12/17/2023 CLINICAL DATA:  Abdominal/flank pain, stone suspected. EXAM: CT ABDOMEN AND PELVIS WITHOUT CONTRAST TECHNIQUE: Multidetector CT imaging of the abdomen and pelvis was performed following the standard protocol without IV contrast. RADIATION DOSE REDUCTION: This exam was performed according to the departmental dose-optimization program which includes automated exposure control, adjustment of the mA and/or kV according to patient size and/or use of iterative reconstruction technique. COMPARISON:  CT scan abdomen and pelvis from 03/11/2020. FINDINGS: Lower chest: Redemonstration of moderate-to-large posteromedial diaphragmatic hernia containing majority of the stomach. There are atelectatic changes in the bilateral lower lobes with associated trace pleural effusions. No mass or consolidation. No suspicious lung nodule. The heart is normal in size. No pericardial effusion. Hepatobiliary: The liver is normal in size. Non-cirrhotic configuration. No suspicious mass. No intrahepatic or extrahepatic bile duct dilation. Small amount of layering gallstones/sludge noted without imaging signs of acute cholecystitis.  Normal gallbladder wall thickness. No pericholecystic inflammatory changes. Pancreas: Unremarkable. No pancreatic ductal dilatation or surrounding inflammatory changes. Spleen: Within normal limits. No focal lesion. Adrenals/Urinary Tract:  Adrenal glands are unremarkable. No suspicious renal mass within the limitations of this unenhanced exam. No hydroureteronephrosis or nephroureterolithiasis. Unremarkable urinary bladder. Stomach/Bowel: No disproportionate dilation of the small or large bowel loops. No evidence of abnormal bowel wall thickening or inflammatory changes. The appendix was not visualized; however there is no acute inflammatory process in the right lower quadrant. Moderate-to-large amount of stool burden noted throughout the colon. Vascular/Lymphatic: No ascites or pneumoperitoneum. No abdominal or pelvic lymphadenopathy, by size criteria. No aneurysmal dilation of the major abdominal arteries. There are moderate peripheral atherosclerotic vascular calcifications of the aorta and its major branches. Reproductive: The uterus is surgically absent. No large adnexal mass. Other: There is a small fat containing periumbilical hernia. There also bilateral small fat containing inguinal hernias. The soft tissues and abdominal wall are otherwise unremarkable. Musculoskeletal: No suspicious osseous lesions. There are moderate multilevel degenerative changes in the visualized spine. IMPRESSION: 1. No nephroureterolithiasis or obstructive uropathy. 2. No acute inflammatory process identified within the abdomen or pelvis. 3. Atelectatic changes in the bilateral lung lower lobes with associated trace bilateral pleural effusions. No mass or consolidation. 4. Multiple chronic nonemergent observations, as described above. Aortic Atherosclerosis (ICD10-I70.0). Electronically Signed   By: Beula Brunswick M.D.   On: 12/17/2023 09:26   DG Chest Port 1 View Result Date: 12/03/2023 CLINICAL DATA:  Tachycardia EXAM: PORTABLE  CHEST 1 VIEW COMPARISON:  Chest radiograph dated 11/29/2023 FINDINGS: Low lung volumes with bronchovascular crowding. Again seen is large hiatal hernia within the left lower thorax. Increased dense left retrocardiac opacity. No pleural effusion or pneumothorax. Similar cardiomediastinal silhouette. No acute osseous abnormality. IMPRESSION: 1. Increased dense left retrocardiac opacity, likely atelectasis. Aspiration or pneumonia can be considered in the appropriate clinical setting. 2. Large hiatal hernia. Electronically Signed   By: Limin  Xu M.D.   On: 12/03/2023 14:28   CT Head Wo Contrast Result Date: 11/29/2023 CLINICAL DATA:  Altered mental status EXAM: CT HEAD WITHOUT CONTRAST TECHNIQUE: Contiguous axial images were obtained from the base of the skull through the vertex without intravenous contrast. RADIATION DOSE REDUCTION: This exam was performed according to the departmental dose-optimization program which includes automated exposure control, adjustment of the mA and/or kV according to patient size and/or use of iterative reconstruction technique. COMPARISON:  02/10/2023 FINDINGS: Brain: No evidence of acute infarction, hemorrhage, hydrocephalus, extra-axial collection or mass lesion/mass effect. Mild atrophic and chronic white matter ischemic changes are noted. Vascular: No hyperdense vessel or unexpected calcification. Skull: Normal. Negative for fracture or focal lesion. Sinuses/Orbits: No acute finding. Other: None. IMPRESSION: Chronic atrophic and ischemic changes. Electronically Signed   By: Violeta Grey M.D.   On: 11/29/2023 22:08   DG Chest 1 View Result Date: 11/29/2023 CLINICAL DATA:  Altered mental status EXAM: CHEST  1 VIEW COMPARISON:  Chest x-ray 02/10/2023 FINDINGS: Heart is enlarged. There is a small left pleural effusion and minimal left basilar patchy opacities. Right lung is clear. No pneumothorax or acute fracture. IMPRESSION: 1. Small left pleural effusion and minimal left basilar  patchy opacities, likely atelectasis. 2. Cardiomegaly. Electronically Signed   By: Tyron Gallon M.D.   On: 11/29/2023 21:49    Time coordinating discharge: 55 mins  SIGNED:  Unk Garb, DO [facility] 12/22/23, 1:06 PM

## 2023-12-22 NOTE — Progress Notes (Signed)
 Mobility Specialist - Progress Note   12/22/23 1100  Mobility  Activity Ambulated with assistance in hallway;Turned to right side;Turned to left side;Turned to back - supine  Level of Assistance Contact guard assist, steadying assist  Assistive Device Front wheel walker  Distance Ambulated (ft) 180 ft  Activity Response Tolerated well  Mobility visit 1 Mobility     Pt lying in bed upon arrival, utilizing RA. Pt agreeable to activity. Completed bed mobility with maxA +1. STS with minA and ambulation in hallway with CGA. Pt completed 1 lap around nurses station. VC for maintaining hand placement onto RW and direction. No LOB. Pt returned to bed with alarm set, needs in reach. Sitter at bedside.    Searcy Czech Mobility Specialist 12/22/23, 11:09 AM

## 2023-12-22 NOTE — Assessment & Plan Note (Signed)
 12-18-2023 through 12-21-2023 Acute exacerbation of chronic HFpEF - Responded well to Lasix  IV, diuresing greater than 4 L.  Strict I's and O's.  Appears euvolemic.  Decreased diuretic to Lasix  40 mg IV daily.  Not hypoxic.  Continue to monitor closely.  12-22-2023 stop IVF lasix . Scr up to 1.1 today.

## 2023-12-22 NOTE — TOC Progression Note (Signed)
 Transition of Care [facility]) - Progression Note    Patient Details  Name: Jill Snow MRN: [id number] Date of Birth: 1934/07/30  [facility] CM/SW Contact  Loman Risk, RN Phone Number: 12/22/2023, 11:30 AM  Clinical Narrative:     Spoke with son [name] and provided SNF bed offers  He states they have decided for patient to return home with home health services through [facility]. Per Randel Buss with [facility] patient active with PT, OT, aide.  MD notified        Expected Discharge Plan and Services                                               Social Determinants of Health (SDOH) Interventions SDOH Screenings   Food Insecurity: Patient Unable To Answer (12/18/2023)  Housing: Patient Unable To Answer (12/18/2023)  Transportation Needs: Patient Unable To Answer (12/18/2023)  Utilities: Patient Unable To Answer (12/18/2023)  Depression (PHQ2-9): Low Risk  (11/10/2022)  Financial Resource Strain: Low Risk  (06/02/2021)  Physical Activity: Insufficiently Active (06/02/2021)  Social Connections: Patient Unable To Answer (12/18/2023)  Recent Concern: Social Connections - Socially Isolated (11/30/2023)  Stress: No Stress Concern Present (06/02/2021)  Tobacco Use: Low Risk  (12/17/2023)    Readmission Risk Interventions     No data to display

## 2023-12-22 NOTE — Assessment & Plan Note (Signed)
 12-22-2023 chronic. Family choosing to take pt home despite recommendation for SNF placement.

## 2023-12-24 ENCOUNTER — Telehealth: Payer: Self-pay | Admitting: Cardiovascular Disease

## 2023-12-24 NOTE — Telephone Encounter (Signed)
 Physical therapist Patricio Boop called stating the patient has not urinated since leaving the hospital on Wednesday. Physical Therapist Patricio Boop stated the swelling has gone down and the patient is drinking plenty of water . Patricio Boop would like verbal orders for the patient to complete physical therapy. Jill Snow mentioned he will have a tooth extracted today and may not be able to speak. Jill Snow requested we call the patient in regard to not being able to urinate. Please advise.

## 2023-12-24 NOTE — Telephone Encounter (Signed)
 Left voicemail message to call back.   Cesario needs to reach out to patients PCP in regards to needing PT orders and urination problems.

## 2023-12-27 ENCOUNTER — Emergency Department

## 2023-12-27 ENCOUNTER — Inpatient Hospital Stay
Admission: EM | Admit: 2023-12-27 | Discharge: 2023-12-31 | DRG: 871 | Disposition: A | Discharge status: Hospice | Attending: Internal Medicine | Admitting: Internal Medicine

## 2023-12-27 ENCOUNTER — Observation Stay

## 2023-12-27 ENCOUNTER — Other Ambulatory Visit: Payer: Self-pay

## 2023-12-27 DIAGNOSIS — R531 Weakness: Secondary | ICD-10-CM

## 2023-12-27 DIAGNOSIS — F05 Delirium due to known physiological condition: Secondary | ICD-10-CM | POA: Diagnosis present

## 2023-12-27 DIAGNOSIS — Z66 Do not resuscitate: Secondary | ICD-10-CM | POA: Diagnosis present

## 2023-12-27 DIAGNOSIS — Z604 Social exclusion and rejection: Secondary | ICD-10-CM | POA: Diagnosis present

## 2023-12-27 DIAGNOSIS — I452 Bifascicular block: Secondary | ICD-10-CM | POA: Diagnosis present

## 2023-12-27 DIAGNOSIS — Z7901 Long term (current) use of anticoagulants: Secondary | ICD-10-CM

## 2023-12-27 DIAGNOSIS — Z83438 Family history of other disorder of lipoprotein metabolism and other lipidemia: Secondary | ICD-10-CM

## 2023-12-27 DIAGNOSIS — A4181 Sepsis due to Enterococcus: Secondary | ICD-10-CM | POA: Diagnosis not present

## 2023-12-27 DIAGNOSIS — Z833 Family history of diabetes mellitus: Secondary | ICD-10-CM

## 2023-12-27 DIAGNOSIS — Z823 Family history of stroke: Secondary | ICD-10-CM

## 2023-12-27 DIAGNOSIS — Z5971 Insufficient health insurance coverage: Secondary | ICD-10-CM

## 2023-12-27 DIAGNOSIS — A419 Sepsis, unspecified organism: Secondary | ICD-10-CM | POA: Diagnosis present

## 2023-12-27 DIAGNOSIS — Z880 Allergy status to penicillin: Secondary | ICD-10-CM

## 2023-12-27 DIAGNOSIS — Z809 Family history of malignant neoplasm, unspecified: Secondary | ICD-10-CM

## 2023-12-27 DIAGNOSIS — R4182 Altered mental status, unspecified: Secondary | ICD-10-CM | POA: Diagnosis not present

## 2023-12-27 DIAGNOSIS — I340 Nonrheumatic mitral (valve) insufficiency: Secondary | ICD-10-CM | POA: Diagnosis present

## 2023-12-27 DIAGNOSIS — N390 Urinary tract infection, site not specified: Secondary | ICD-10-CM | POA: Diagnosis present

## 2023-12-27 DIAGNOSIS — G4730 Sleep apnea, unspecified: Secondary | ICD-10-CM | POA: Diagnosis present

## 2023-12-27 DIAGNOSIS — Z8249 Family history of ischemic heart disease and other diseases of the circulatory system: Secondary | ICD-10-CM

## 2023-12-27 DIAGNOSIS — I4892 Unspecified atrial flutter: Secondary | ICD-10-CM | POA: Diagnosis present

## 2023-12-27 DIAGNOSIS — I110 Hypertensive heart disease with heart failure: Secondary | ICD-10-CM | POA: Diagnosis present

## 2023-12-27 DIAGNOSIS — E7800 Pure hypercholesterolemia, unspecified: Secondary | ICD-10-CM | POA: Diagnosis present

## 2023-12-27 DIAGNOSIS — Z888 Allergy status to other drugs, medicaments and biological substances status: Secondary | ICD-10-CM

## 2023-12-27 DIAGNOSIS — Z87440 Personal history of urinary (tract) infections: Secondary | ICD-10-CM

## 2023-12-27 DIAGNOSIS — R680 Hypothermia, not associated with low environmental temperature: Secondary | ICD-10-CM | POA: Diagnosis present

## 2023-12-27 DIAGNOSIS — G9341 Metabolic encephalopathy: Secondary | ICD-10-CM | POA: Diagnosis not present

## 2023-12-27 DIAGNOSIS — F0390 Unspecified dementia without behavioral disturbance: Secondary | ICD-10-CM | POA: Diagnosis present

## 2023-12-27 DIAGNOSIS — Z8261 Family history of arthritis: Secondary | ICD-10-CM

## 2023-12-27 DIAGNOSIS — R4701 Aphasia: Principal | ICD-10-CM

## 2023-12-27 DIAGNOSIS — Z79899 Other long term (current) drug therapy: Secondary | ICD-10-CM

## 2023-12-27 DIAGNOSIS — T68XXXA Hypothermia, initial encounter: Secondary | ICD-10-CM

## 2023-12-27 DIAGNOSIS — I480 Paroxysmal atrial fibrillation: Secondary | ICD-10-CM | POA: Diagnosis present

## 2023-12-27 DIAGNOSIS — Z885 Allergy status to narcotic agent status: Secondary | ICD-10-CM

## 2023-12-27 DIAGNOSIS — Z515 Encounter for palliative care: Secondary | ICD-10-CM

## 2023-12-27 DIAGNOSIS — I5032 Chronic diastolic (congestive) heart failure: Secondary | ICD-10-CM | POA: Diagnosis present

## 2023-12-27 DIAGNOSIS — Z7189 Other specified counseling: Secondary | ICD-10-CM

## 2023-12-27 DIAGNOSIS — I959 Hypotension, unspecified: Secondary | ICD-10-CM

## 2023-12-27 DIAGNOSIS — R001 Bradycardia, unspecified: Secondary | ICD-10-CM

## 2023-12-27 LAB — CBC
HCT: 35.6 % — ABNORMAL LOW (ref 36.0–46.0)
Hemoglobin: 11.7 g/dL — ABNORMAL LOW (ref 12.0–15.0)
MCH: 32.1 pg (ref 26.0–34.0)
MCHC: 32.9 g/dL (ref 30.0–36.0)
MCV: 97.8 fL (ref 80.0–100.0)
Platelets: 213 10*3/uL (ref 150–400)
RBC: 3.64 MIL/uL — ABNORMAL LOW (ref 3.87–5.11)
RDW: 12.4 % (ref 11.5–15.5)
WBC: 3.1 10*3/uL — ABNORMAL LOW (ref 4.0–10.5)
nRBC: 0 % (ref 0.0–0.2)

## 2023-12-27 LAB — BLOOD GAS, ARTERIAL
Acid-base deficit: 0.4 mmol/L (ref 0.0–2.0)
Bicarbonate: 24.8 mmol/L (ref 20.0–28.0)
FIO2: 21 %
O2 Saturation: 97 %
Patient temperature: 33.9
pCO2 arterial: 37 mmHg (ref 32–48)
pH, Arterial: 7.43 (ref 7.35–7.45)
pO2, Arterial: 62 mmHg — ABNORMAL LOW (ref 83–108)

## 2023-12-27 LAB — URINALYSIS, W/ REFLEX TO CULTURE (INFECTION SUSPECTED)
Bilirubin Urine: NEGATIVE
Glucose, UA: NEGATIVE mg/dL
Hgb urine dipstick: NEGATIVE
Ketones, ur: NEGATIVE mg/dL
Nitrite: NEGATIVE
Protein, ur: NEGATIVE mg/dL
Specific Gravity, Urine: 1.02 (ref 1.005–1.030)
WBC, UA: >50 WBC/hpf (ref 0–5)
pH: 5 (ref 5.0–8.0)

## 2023-12-27 LAB — CBC WITH DIFFERENTIAL/PLATELET
Abs Immature Granulocytes: 0.01 10*3/uL (ref 0.00–0.07)
Basophils Absolute: 0 10*3/uL (ref 0.0–0.1)
Basophils Relative: 1 %
Eosinophils Absolute: 0.1 10*3/uL (ref 0.0–0.5)
Eosinophils Relative: 2 %
HCT: 37.7 % (ref 36.0–46.0)
Hemoglobin: 11.9 g/dL — ABNORMAL LOW (ref 12.0–15.0)
Immature Granulocytes: 0 %
Lymphocytes Relative: 17 %
Lymphs Abs: 0.6 10*3/uL — ABNORMAL LOW (ref 0.7–4.0)
MCH: 31.4 pg (ref 26.0–34.0)
MCHC: 31.6 g/dL (ref 30.0–36.0)
MCV: 99.5 fL (ref 80.0–100.0)
Monocytes Absolute: 0.2 10*3/uL (ref 0.1–1.0)
Monocytes Relative: 6 %
Neutro Abs: 2.6 10*3/uL (ref 1.7–7.7)
Neutrophils Relative %: 74 %
Platelets: 221 10*3/uL (ref 150–400)
RBC: 3.79 MIL/uL — ABNORMAL LOW (ref 3.87–5.11)
RDW: 12.4 % (ref 11.5–15.5)
WBC: 3.5 10*3/uL — ABNORMAL LOW (ref 4.0–10.5)
nRBC: 0 % (ref 0.0–0.2)

## 2023-12-27 LAB — COMPREHENSIVE METABOLIC PANEL WITH GFR
ALT: 34 U/L (ref 0–44)
ALT: 31 U/L (ref 0–44)
AST: 34 U/L (ref 15–41)
AST: 31 U/L (ref 15–41)
Albumin: 3.7 g/dL (ref 3.5–5.0)
Albumin: 3.4 g/dL — ABNORMAL LOW (ref 3.5–5.0)
Alkaline Phosphatase: 86 U/L (ref 38–126)
Alkaline Phosphatase: 80 U/L (ref 38–126)
Anion gap: 5 (ref 5–15)
Anion gap: 9 (ref 5–15)
BUN: 20 mg/dL (ref 8–23)
BUN: 19 mg/dL (ref 8–23)
CO2: 27 mmol/L (ref 22–32)
CO2: 24 mmol/L (ref 22–32)
Calcium: 9.3 mg/dL (ref 8.9–10.3)
Calcium: 9.3 mg/dL (ref 8.9–10.3)
Chloride: 102 mmol/L (ref 98–111)
Chloride: 103 mmol/L (ref 98–111)
Creatinine, Ser: 1.06 mg/dL — ABNORMAL HIGH (ref 0.44–1.00)
Creatinine, Ser: 0.91 mg/dL (ref 0.44–1.00)
GFR, Estimated: 51 mL/min — ABNORMAL LOW
GFR, Estimated: >60 mL/min (ref >60)
Glucose, Bld: 134 mg/dL — ABNORMAL HIGH (ref 70–99)
Glucose, Bld: 119 mg/dL — ABNORMAL HIGH (ref 70–99)
Potassium: 5.2 mmol/L — ABNORMAL HIGH (ref 3.5–5.1)
Potassium: 4.8 mmol/L (ref 3.5–5.1)
Sodium: 134 mmol/L — ABNORMAL LOW (ref 135–145)
Sodium: 136 mmol/L (ref 135–145)
Total Bilirubin: 0.9 mg/dL (ref 0.0–1.2)
Total Bilirubin: 1 mg/dL (ref 0.0–1.2)
Total Protein: 7.1 g/dL (ref 6.5–8.1)
Total Protein: 6.8 g/dL (ref 6.5–8.1)

## 2023-12-27 LAB — TSH
TSH: 1.241 u[IU]/mL (ref 0.350–4.500)
TSH: 1.342 u[IU]/mL (ref 0.350–4.500)

## 2023-12-27 LAB — GLUCOSE, CAPILLARY: Glucose-Capillary: 117 mg/dL — ABNORMAL HIGH (ref 70–99)

## 2023-12-27 LAB — PHOSPHORUS
Phosphorus: 3.7 mg/dL (ref 2.5–4.6)
Phosphorus: 3.7 mg/dL (ref 2.5–4.6)

## 2023-12-27 LAB — TROPONIN I (HIGH SENSITIVITY)
Troponin I (High Sensitivity): 3 ng/L (ref <18)
Troponin I (High Sensitivity): 3 ng/L (ref <18)

## 2023-12-27 LAB — MRSA NEXT GEN BY PCR, NASAL: MRSA by PCR Next Gen: NOT DETECTED

## 2023-12-27 LAB — PROTIME-INR
INR: 1.3 — ABNORMAL HIGH (ref 0.8–1.2)
Prothrombin Time: 16.6 s — ABNORMAL HIGH (ref 11.4–15.2)

## 2023-12-27 LAB — LACTIC ACID, PLASMA
Lactic Acid, Venous: 1.2 mmol/L (ref 0.5–1.9)
Lactic Acid, Venous: 1.1 mmol/L (ref 0.5–1.9)

## 2023-12-27 LAB — MAGNESIUM
Magnesium: 2.5 mg/dL — ABNORMAL HIGH (ref 1.7–2.4)
Magnesium: 2.6 mg/dL — ABNORMAL HIGH (ref 1.7–2.4)

## 2023-12-27 LAB — AMMONIA: Ammonia: <13 umol/L (ref 9–35)

## 2023-12-27 MED ORDER — SODIUM CHLORIDE 0.9 % IV BOLUS
1000.0000 mL | Freq: Once | INTRAVENOUS | Status: AC
  Administered 2023-12-27: 1000 mL via INTRAVENOUS

## 2023-12-27 MED ORDER — SODIUM CHLORIDE 0.9 % IV SOLN
1.0000 g | Freq: Once | INTRAVENOUS | Status: AC
  Administered 2023-12-27: 1 g via INTRAVENOUS
  Filled 2023-12-27: qty 10

## 2023-12-27 MED ORDER — SODIUM CHLORIDE 0.9 % IV SOLN
1.0000 g | INTRAVENOUS | Status: DC
  Administered 2023-12-28: 1 g via INTRAVENOUS
  Filled 2023-12-27: qty 10

## 2023-12-27 MED ORDER — CHLORHEXIDINE GLUCONATE CLOTH 2 % EX PADS
6.0000 | MEDICATED_PAD | Freq: Every day | CUTANEOUS | Status: DC
  Administered 2023-12-27: 6 via TOPICAL

## 2023-12-27 MED ORDER — SENNOSIDES-DOCUSATE SODIUM 8.6-50 MG PO TABS: 1.0000 | ORAL_TABLET | Freq: Every evening | ORAL | Status: DC | PRN

## 2023-12-27 MED ORDER — SODIUM CHLORIDE 0.9 % IV SOLN: INTRAVENOUS | Status: DC

## 2023-12-27 MED ORDER — ACETAMINOPHEN 325 MG PO TABS: 650.0000 mg | ORAL_TABLET | Freq: Four times a day (QID) | ORAL | Status: DC | PRN

## 2023-12-27 MED ORDER — ACETAMINOPHEN 650 MG RE SUPP: 650.0000 mg | Freq: Four times a day (QID) | RECTAL | Status: DC | PRN

## 2023-12-27 MED ORDER — ONDANSETRON HCL 4 MG PO TABS: 4.0000 mg | ORAL_TABLET | Freq: Four times a day (QID) | ORAL | Status: DC | PRN

## 2023-12-27 MED ORDER — ONDANSETRON HCL 4 MG/2ML IJ SOLN: 4.0000 mg | Freq: Four times a day (QID) | INTRAMUSCULAR | Status: DC | PRN

## 2023-12-27 NOTE — ED Provider Notes (Addendum)
 [name] Provider Note    Event Date/Time   First MD Initiated Contact with Patient 12/27/23 1220     (approximate)   History   Altered Mental Status   HPI  Jill Snow is a 89 y.o. female with history of dementia, paroxysmal A-fib, on Eliquis , history of CHF, presenting with generalized weakness and aphasia.  Per EMS patient has had the symptoms for last 3 to 4 days.  History was obtained from son who lives with patient.  States that when she gets UTIs, she will present this way, weakness and inability to talk.  No fevers at home.  No known falls.  She is able to follow commands.  Independent history obtained from EMS as above.  On independent review, she was admitted in early May for generalized weakness, was admitted for inability to walk, symptoms thought secondary to UTI, also presented with acute encephalopathy that was exacerbated by her UTI.  During her prior mission she was able to state her name.    Physical Exam   Triage Vital Signs: ED Triage Vitals  Encounter Vitals Group     BP      Systolic BP Percentile      Diastolic BP Percentile      Pulse      Resp      Temp      Temp src      SpO2      Weight      Height      Head Circumference      Peak Flow      Pain Score      Pain Loc      Pain Education      Exclude from Growth Chart     Most recent vital signs: Vitals:   12/27/23 1430 12/27/23 1500  BP: (!) 110/54 (!) 110/54  Pulse: (!) 47 (!) 47  Resp: 19 11  Temp:    SpO2: 100% 100%     General: Awake, no distress.  CV:  Good peripheral perfusion.  Resp:  Normal effort.  Clear Abd:  No distention.  Soft nontender Other:  Equal grip strength, able to move toes bilaterally, no sensory deficits, no obvious facial droop, she is able to follow commands, slightly sleepy, not talking at this time   ED Results / Procedures / Treatments   Labs (all labs ordered are listed, but only abnormal results are  displayed) Labs Reviewed  COMPREHENSIVE METABOLIC PANEL WITH GFR - Abnormal; Notable for the following components:      Result Value   Sodium 134 (*)    Potassium 5.2 (*)    Glucose, Bld 134 (*)    Creatinine, Ser 1.06 (*)    GFR, Estimated 51 (*)    All other components within normal limits  CBC WITH DIFFERENTIAL/PLATELET - Abnormal; Notable for the following components:   WBC 3.5 (*)    RBC 3.79 (*)    Hemoglobin 11.9 (*)    Lymphs Abs 0.6 (*)    All other components within normal limits  URINALYSIS, W/ REFLEX TO CULTURE (INFECTION SUSPECTED) - Abnormal; Notable for the following components:   Color, Urine YELLOW (*)    APPearance HAZY (*)    Leukocytes,Ua SMALL (*)    Bacteria, UA RARE (*)    All other components within normal limits  PROTIME-INR - Abnormal; Notable for the following components:   Prothrombin Time 16.6 (*)    INR 1.3 (*)  All other components within normal limits  URINE CULTURE  AMMONIA  TROPONIN I (HIGH SENSITIVITY)  TROPONIN I (HIGH SENSITIVITY)     EKG  EKG shows, sinus bradycardia, rate 46, right bundle branch block, normal QTc, T wave inversion in 2 3, T wave flattening to aVF, V5, V6, no ischemic ST elevation, not significantly changed compared to prior   RADIOLOGY On my independent interpretation, x-ray without focal consolidation   PROCEDURES:  Critical Care performed: No  Procedures   MEDICATIONS ORDERED IN ED: Medications  cefTRIAXone  (ROCEPHIN ) 1 g in sodium chloride  0.9 % 100 mL IVPB (has no administration in time range)     IMPRESSION / MDM / ASSESSMENT AND PLAN / ED COURSE  I reviewed the triage vital signs and the nursing notes.                              Differential diagnosis includes, but is not limited to, UTI, pneumonia, electrolyte derangements, atypical ACS, metabolic encephalopathy, hyperammonemia, also considered CVA given that she is not talking at this time, reported last normal was 3 to 4 days ago, she is  outside of the tPA window, stroke alert was not activated.  Will get labs, EKG, troponin, chest x-ray, CT head, UA.  She will likely need to be admitted for further management and MRI.  Patient's presentation is most consistent with acute presentation with potential threat to life or bodily function.  Independent interpretation of labs and imaging below.  Given her aphasia that appears new in last 3 days, put in for MRI brain.  Consult hospitalist is agreeable plan for admission will evaluate the patient.  She is admitted.  UA consistent with UTI, independent review of prior urine culture is susceptible to the IV ceftriaxone  we will give her first dose here.  The patient is on the cardiac monitor to evaluate for evidence of arrhythmia and/or significant heart rate changes.   Clinical Course as of 12/27/23 1519  Mon Dec 27, 2023  1314 DG Chest 2 View IMPRESSION: Underinflation.  Basilar atelectasis or scar.  Known large hiatal hernia.   [TT]  1436 No leukopenia, ammonia levels normal, mild hyperkalemia, mild AKI, LFTs are normal. [TT]  1519 CT Head Wo Contrast IMPRESSION: 1. No acute intracranial abnormality. 2. Stable atrophy and chronic small vessel ischemia.   [TT]  1519 Urinalysis, w/ Reflex to Culture (Infection Suspected) -Urine, Clean Catch(!) Consistent with UTI, will start her on IV antibiotics. [TT]    Clinical Course User Index [TT] Drenda Gentle Richard Champion, MD     FINAL CLINICAL IMPRESSION(S) / ED DIAGNOSES   Final diagnoses:  Altered mental status, unspecified altered mental status type  Aphasia  Weakness  Urinary tract infection without hematuria, site unspecified     Rx / DC Orders   ED Discharge Orders     None        Note:  This document was prepared using Dragon voice recognition software and may include unintentional dictation errors.    Shane Darling, MD 12/27/23 1437    Shane Darling, MD 12/27/23 1520

## 2023-12-27 NOTE — H&P (Signed)
 History and Physical  SARAHANNE DANIELE ZOX:[id number] DOB: 12-25-1933 DOA: 12/27/2023  PCP: Devorah Fonder, MD   Chief Complaint: Generalized weakness, AMS  HPI: Jill Snow is a 89 y.o. female with medical history significant for PAF on Eliquis , advanced dementia, HLD, HTN, chronic HFpEF, and recurrent UTI who presented to the ED for evaluation of generalized weakness and altered mental status. Per son, patient did well for few days after discharge but over the last 3 to 4 days, she has been very weak and refused to work with PT. This morning, patient seemed very lethargic and was not responding to him so he called 911.  Reports that patient has a history of altered mental status in the setting of UTIs. Patient has had 3 hospitalizations in the last 4 weeks due to generalized weakness, altered mental status and UTI.  ED Course: Initial vitals show temp 98.4, RR 13, HR 45, BP 114/69, SpO2 94% on room air. Initial labs showed sodium 134, K+ 5.2, creatinine 1.06, WBC 3.5, Hgb 11.9, platelet 221, PT/INR 60.6/1.3, normal troponin, ammonia, TSH, mag and Phos. UA shows small leuks, negative nitrite, WBC >50, and rare bacteria. EKG shows sinus bradycardia with RBBB and LAFB. CXR with no acute cardiopulmonary disease. CT head with no acute intracranial abnormality.  Patient received IV Rocephin . TRH was consulted for admission.  Review of Systems: Please see HPI for pertinent positives and negatives. A complete 10 system review of systems are otherwise negative.  Past Medical History:  Diagnosis Date   Atrial flutter (HCC)    a. s/p successful TEE/DCCV in 2014; b. TEE 2014 showed EF 45-50%, mild bi-atrial enlargement, mod MR   Dementia (HCC)    Dyspnea    History of chicken pox    Hypercholesterolemia    Hypertension    Mitral regurgitation    a. echo 2014: EF 40-45%, mildly dilated RV, mod reduced RV systolic fxn, mod dilated LA, Mild to mod MR, mod TR, mildly elevated PASP   PAF  (paroxysmal atrial fibrillation) (HCC)    a. initial episode 2011; b. not on long term anticoagulation since 06/2013   RBBB (right bundle branch block)    Seasonal allergies    Sleep apnea    a. on CPAP   Thyroid  nodule 05/19/2017   Past Surgical History:  Procedure Laterality Date   ABDOMINAL HYSTERECTOMY     TONSILLECTOMY AND ADENOIDECTOMY  1947   tummy tuck     Social History:  reports that she has never smoked. She has never used smokeless tobacco. She reports that she does not drink alcohol and does not use drugs.  Allergies  Allergen Reactions   Amoxicillin  Nausea Only   Augmentin  [Amoxicillin -Pot Clavulanate] Nausea And Vomiting   Codeine Anaphylaxis   Crestor  [Rosuvastatin ] Other (See Comments)    Myalgias     Penicillins Other (See Comments)    Unknown reaction Tolerates cephalosporins     Family History  Problem Relation Age of Onset   Stroke Mother    Heart attack Mother    Arthritis Mother    Hyperlipidemia Mother    Heart disease Mother    Diabetes Mother    Diabetes Sister    Cancer Daughter    Heart disease Daughter    Diabetes Daughter    Diabetes Son    Diabetes Maternal Grandmother    Diabetes Maternal Grandfather      Prior to Admission medications   Medication Sig Start Date End Date Taking? Authorizing Provider  acetaminophen  (TYLENOL ) 650 MG CR tablet Take 1,300 mg by mouth every 8 (eight) hours as needed for pain.   Yes [provider]  amiodarone  (PACERONE ) 200 MG tablet Take 1 tablet (200 mg total) by mouth 2 (two) times daily. 12/10/23 02/08/24 Yes Ree Candy, MD  apixaban  (ELIQUIS ) 5 MG TABS tablet Take 1 tablet (5 mg total) by mouth 2 (two) times daily. 04/26/23  Yes Gollan, Timothy J, MD  atorvastatin  (LIPITOR ) 40 MG tablet TAKE 1 TABLET BY MOUTH EVERY DAY 07/12/23  Yes Kent Pear, MD  Cholecalciferol  (VITAMIN D -3 PO) Take 1 capsule by mouth at bedtime.   Yes [provider]  diltiazem  (CARDIZEM  CD) 120  MG 24 hr capsule Take 1 capsule (120 mg total) by mouth 2 (two) times daily. 12/10/23 02/08/24 Yes Ree Candy, MD  furosemide  (LASIX ) 20 MG tablet Take 20 mg by mouth.   Yes [provider]  montelukast  (SINGULAIR ) 10 MG tablet TAKE 1 TABLET BY MOUTH EVERY DAY 07/12/23  Yes Kent Pear, MD  omeprazole  (PRILOSEC) 20 MG capsule Take 1 capsule (20 mg total) by mouth daily. 08/05/23  Yes Gollan, Timothy J, MD  polyethylene glycol (MIRALAX  / GLYCOLAX ) 17 g packet Take 17 g by mouth daily as needed for mild constipation. 12/27/22  Yes Doroteo Gasmen, MD  potassium chloride  SA (KLOR-CON  M20) 20 MEQ tablet Take 1 tablet (20 mEq total) by mouth daily. May take an extra tablet (20 Meq) daily as needed with extra lasix . 04/26/23  Yes Gollan, Timothy J, MD  QUEtiapine  (SEROQUEL ) 50 MG tablet Take 1 tablet (50 mg total) by mouth at bedtime. 12/10/23 02/08/24 Yes Ree Candy, MD  VITAMIN E  PO Take 1 capsule by mouth daily.   Yes [provider]    Physical Exam: BP 137/62 (BP Location: Right Arm)   Pulse (!) 45   Temp 98.4 F (36.9 C) (Axillary)   Resp 16   Wt 75 kg   SpO2 100%   BMI 26.69 kg/m  General: Pleasant, chronically ill and sleepy elderly woman laying in bed. No acute distress. HEENT: [place]/AT. Anicteric sclera. Dry mucous membrane. CV: Bradycardic. Regular rhythm. No murmurs, rubs, or gallops. No LE edema Pulmonary: Lungs CTAB. Normal effort. No wheezing or rales. Abdominal: Soft, nontender, nondistended. Normal bowel sounds. Extremities: Palpable radial and DP pulses. Normal ROM. Skin: Warm and dry. No obvious rash or lesions. Decreased skin turgor. Neuro: Somnolent. Eyes closed but open spontaneously. Able to tell me her name.  Disoriented to person place and time. Follows simple instructions intermittently. Moving all extremities.         Labs on Admission:  Basic Metabolic Panel: Recent Labs  Lab 12/21/23 0454 12/22/23 0453 12/27/23 1223  12/27/23 1638 12/27/23 1722  NA 139 141 134*  --  136  K 3.4* 4.0 5.2*  --  4.8  CL 101 103 102  --  103  CO2 26 27 27   --  24  GLUCOSE 106* 102* 134*  --  119*  BUN 33* 39* 20  --  19  CREATININE 0.88 1.11* 1.06*  --  0.91  CALCIUM  9.4 9.5 9.3  --  9.3  MG 2.5* 2.5*  --  2.6* 2.5*  PHOS  --   --   --  3.7 3.7   Liver Function Tests: Recent Labs  Lab 12/27/23 1223  AST 34  ALT 34  ALKPHOS 86  BILITOT 0.9  PROT 7.1  ALBUMIN 3.7  No results for input(s): "LIPASE", "AMYLASE" in the last 168 hours. Recent Labs  Lab 12/27/23 1234  AMMONIA <13   CBC: Recent Labs  Lab 12/21/23 0454 12/22/23 0453 12/27/23 1223 12/27/23 1722  WBC 4.1 5.6 3.5* 3.1*  NEUTROABS  --   --  2.6  --   HGB 13.3 13.1 11.9* 11.7*  HCT 40.0 39.8 37.7 35.6*  MCV 96.2 96.4 99.5 97.8  PLT 270 275 221 213   Cardiac Enzymes: No results for input(s): "CKTOTAL", "CKMB", "CKMBINDEX", "TROPONINI" in the last 168 hours. BNP (last 3 results) Recent Labs    11/29/23 2000  BNP 95.1    ProBNP (last 3 results) No results for input(s): "PROBNP" in the last 8760 hours.  CBG: No results for input(s): "GLUCAP" in the last 168 hours.  Radiological Exams on Admission: CT Head Wo Contrast Result Date: 12/27/2023 CLINICAL DATA:  Mental status change, unknown cause Lethargy. EXAM: CT HEAD WITHOUT CONTRAST TECHNIQUE: Contiguous axial images were obtained from the base of the skull through the vertex without intravenous contrast. RADIATION DOSE REDUCTION: This exam was performed according to the departmental dose-optimization program which includes automated exposure control, adjustment of the mA and/or kV according to patient size and/or use of iterative reconstruction technique. COMPARISON:  12/17/2023 FINDINGS: Brain: No intracranial hemorrhage, mass effect, or midline shift. Stable degree of atrophy and chronic small vessel ischemia. No hydrocephalus. The basilar cisterns are patent. No evidence of territorial  infarct or acute ischemia. No extra-axial or intracranial fluid collection. Vascular: Atherosclerosis of skullbase vasculature without hyperdense vessel or abnormal calcification. Skull: No fracture or focal lesion. Sinuses/Orbits: No acute findings. Mild chronic cortical thickening of the right maxillary sinus. Other: None. IMPRESSION: 1. No acute intracranial abnormality. 2. Stable atrophy and chronic small vessel ischemia. Electronically Signed   By: Chadwick Colonel M.D.   On: 12/27/2023 15:10   DG Chest 2 View Result Date: 12/27/2023 CLINICAL DATA:  Weakness.  Possibly TI EXAM: CHEST - 2 VIEW COMPARISON:  X-ray 12/03/2023 FINDINGS: Enlarged cardiopericardial silhouette. No pneumothorax or edema. There is some linear opacity lung bases likely scar or atelectasis. No consolidation. Known large hiatal hernia. Air-fluid level along the herniated stomach. Degenerative changes along the spine. IMPRESSION: Underinflation.  Basilar atelectasis or scar. Known large hiatal hernia. Electronically Signed   By: Adrianna Horde M.D.   On: 12/27/2023 13:11   Assessment/Plan SHERRILYNN ROSELLE is a 89 y.o. female with medical history significant for PAF on Eliquis , advanced dementia, HLD, HTN, chronic HFpEF, and recurrent UTI who presented to the ED for evaluation of generalized weakness and altered mental status and admitted for sepsis secondary to UTI.   # Acute metabolic encephalopathy - Has had significant decline in functional status over the last month with recurrent hospitalization - Oriented to self only at baseline but has become less responsive over the last 3 to 4 days - CT head negative, no toxic, electrolyte abnormalities - Likely secondary to recurrent UTI compounded by her advanced dementia - Admit to med telemetry - Follow-up brain MRI - SLP consulted for evaluation, keep n.p.o. - Delirium precautions  Addendum: After patient was moved to the floor, RN noticed patient less responsive with  temperature down to 92. I went to the bedside to evaluate patient and ordered a Lawyer. On assessment, patient slightly less responsive but occasionally opens eyes on command and able to squeeze fingers. Stat labs shows normal ABG, normal lactic acid, normal CMP and stable hemoglobin and WBC. - Transferred to  stepdown unit for close monitoring  # Hypothermia - Patient presented with axillary temp of 98.4, temp dropped to 92-93 while on the floor - Drop in temperature likely contributed to slight change in mental status change. - Geophysicist/field seismologist until temp consistently above 97  # Sepsis secondary to UTI # Recurrent UTI - Has had multiple UTIs complicated by altered mental status requiring hospitalizations over the last month - UA shows signs of infection but improved compared to UA from last hospitalization - Urine culture 10 days ago grew E. coli sensitive to Rocephin  - After admission, patient met sepsis criteria with hypotension, hypothermia, bradycardia, leukopenia and evidence of UTI - Continue IV Rocephin  - IV hydration as below - Follow-up urine cultures - Consider discharge home with prophylactic antibiotics  # Hypotension - BP stable on admission but dropped with SBP in the 80s to 100s while on the floor - Lactic acid within normal limits. - BP still low after 1 L IV NS bolus - Repeat IV LR 1 L bolus followed by 75 cc/h - Watch respiratory status closely with history of diastolic HF  # Paroxysmal A-fib # Bradycardia - Presented with HR in the 40s with some improvement to the 50s - Last hospitalization, patient heart rate gradually decreased to the 50s close to the discharge - TSH, mag and Phos unremarkable - Avoid nodal blockade - Telemetry  # HFpEF - Patient dry on exam - Hold diuretics for now - Supplemental O2 as needed  # Physical deconditioning # Advanced dementia # Goals of care - Patient has had significant decline in her functional status over the  last month - Has had multiple hospitalizations in the setting of altered mental status and generalized deconditioning - Due to her advanced dementia, discussed with son and daughter about involving palliative care to assist with goals of care and consider discharge from this hospitalization with home hospice. Family agreeable to this plan. - Patient is DNR - Palliative care consulted, appreciate recs - PT/OT eval  DVT prophylaxis: SCDs    Code Status: Limited: Do not attempt resuscitation (DNR) -DNR-LIMITED -Do Not Intubate/DNI   Consults called: None  Family Communication: Discussed admission with daughter at bedside and son over the phone  Severity of Illness: The appropriate patient status for this patient is OBSERVATION. Observation status is judged to be reasonable and necessary in order to provide the required intensity of service to ensure the patient's safety. The patient's presenting symptoms, physical exam findings, and initial radiographic and laboratory data in the context of their medical condition is felt to place them at decreased risk for further clinical deterioration. Furthermore, it is anticipated that the patient will be medically stable for discharge from the hospital within 2 midnights of admission.   Level of care: Telemetry Medical   This record has been created using Conservation officer, historic buildings. Errors have been sought and corrected, but may not always be located. Such creation errors do not reflect on the standard of care.   Vita Grip, MD 12/27/2023, 5:05 PM [facility] Pager: [phone] Isaiah 41:10   If 7PM-7AM, please contact night-coverage [email or web address] Password [email or web address]

## 2023-12-27 NOTE — ED Triage Notes (Signed)
 Pt arrived via EMS from home. Per MD, EMS reports that pt has had several days of being lethargic and not talking. MD continues to sts that pt normally gets this way when she gets her UTI's per EMS report.

## 2023-12-27 NOTE — Progress Notes (Signed)
 Called by [facility] RN to come assess patient. She had just gotten to 217 from ED. Pt minimally responsive and rectal temp 92. MD on his way to bedside also. On my arrival pt will open eyes to name being called. MD at bedside ordering labs and fluid bolus. Bair hugger also applied to pt. MD placed transfer orders to PCU. Spoke with Rudol[facility] Coss and no PCU beds available. Transfer order changed to stepdown and pt will transfer to ICU 15.

## 2023-12-28 ENCOUNTER — Ambulatory Visit: Admitting: Cardiology

## 2023-12-28 DIAGNOSIS — G9341 Metabolic encephalopathy: Secondary | ICD-10-CM | POA: Diagnosis present

## 2023-12-28 DIAGNOSIS — Z833 Family history of diabetes mellitus: Secondary | ICD-10-CM | POA: Diagnosis not present

## 2023-12-28 DIAGNOSIS — Z7901 Long term (current) use of anticoagulants: Secondary | ICD-10-CM | POA: Diagnosis not present

## 2023-12-28 DIAGNOSIS — R4182 Altered mental status, unspecified: Secondary | ICD-10-CM | POA: Diagnosis not present

## 2023-12-28 DIAGNOSIS — R4701 Aphasia: Principal | ICD-10-CM

## 2023-12-28 DIAGNOSIS — Z7189 Other specified counseling: Secondary | ICD-10-CM | POA: Diagnosis not present

## 2023-12-28 DIAGNOSIS — E7800 Pure hypercholesterolemia, unspecified: Secondary | ICD-10-CM | POA: Diagnosis present

## 2023-12-28 DIAGNOSIS — Z87440 Personal history of urinary (tract) infections: Secondary | ICD-10-CM | POA: Diagnosis not present

## 2023-12-28 DIAGNOSIS — Z8249 Family history of ischemic heart disease and other diseases of the circulatory system: Secondary | ICD-10-CM | POA: Diagnosis not present

## 2023-12-28 DIAGNOSIS — N390 Urinary tract infection, site not specified: Secondary | ICD-10-CM | POA: Diagnosis present

## 2023-12-28 DIAGNOSIS — Z66 Do not resuscitate: Secondary | ICD-10-CM | POA: Diagnosis present

## 2023-12-28 DIAGNOSIS — A419 Sepsis, unspecified organism: Secondary | ICD-10-CM

## 2023-12-28 DIAGNOSIS — Z515 Encounter for palliative care: Secondary | ICD-10-CM | POA: Diagnosis not present

## 2023-12-28 DIAGNOSIS — Z888 Allergy status to other drugs, medicaments and biological substances status: Secondary | ICD-10-CM | POA: Diagnosis not present

## 2023-12-28 DIAGNOSIS — Z880 Allergy status to penicillin: Secondary | ICD-10-CM | POA: Diagnosis not present

## 2023-12-28 DIAGNOSIS — R680 Hypothermia, not associated with low environmental temperature: Secondary | ICD-10-CM | POA: Diagnosis present

## 2023-12-28 DIAGNOSIS — R531 Weakness: Secondary | ICD-10-CM

## 2023-12-28 DIAGNOSIS — A4181 Sepsis due to Enterococcus: Secondary | ICD-10-CM | POA: Diagnosis present

## 2023-12-28 DIAGNOSIS — I4892 Unspecified atrial flutter: Secondary | ICD-10-CM | POA: Diagnosis present

## 2023-12-28 DIAGNOSIS — F05 Delirium due to known physiological condition: Secondary | ICD-10-CM | POA: Diagnosis present

## 2023-12-28 DIAGNOSIS — I5032 Chronic diastolic (congestive) heart failure: Secondary | ICD-10-CM | POA: Diagnosis present

## 2023-12-28 DIAGNOSIS — Z885 Allergy status to narcotic agent status: Secondary | ICD-10-CM | POA: Diagnosis not present

## 2023-12-28 DIAGNOSIS — I110 Hypertensive heart disease with heart failure: Secondary | ICD-10-CM | POA: Diagnosis present

## 2023-12-28 DIAGNOSIS — G4730 Sleep apnea, unspecified: Secondary | ICD-10-CM | POA: Diagnosis present

## 2023-12-28 DIAGNOSIS — F0390 Unspecified dementia without behavioral disturbance: Secondary | ICD-10-CM | POA: Diagnosis present

## 2023-12-28 DIAGNOSIS — I340 Nonrheumatic mitral (valve) insufficiency: Secondary | ICD-10-CM | POA: Diagnosis present

## 2023-12-28 DIAGNOSIS — I480 Paroxysmal atrial fibrillation: Secondary | ICD-10-CM | POA: Diagnosis present

## 2023-12-28 DIAGNOSIS — I452 Bifascicular block: Secondary | ICD-10-CM | POA: Diagnosis present

## 2023-12-28 DIAGNOSIS — N3000 Acute cystitis without hematuria: Secondary | ICD-10-CM | POA: Diagnosis not present

## 2023-12-28 LAB — GLUCOSE, CAPILLARY: Glucose-Capillary: 125 mg/dL — ABNORMAL HIGH (ref 70–99)

## 2023-12-28 MED ORDER — HYDROMORPHONE HCL 1 MG/ML IJ SOLN
1.0000 mg | INTRAMUSCULAR | Status: DC | PRN
  Administered 2023-12-29 (×2): 1 mg via INTRAVENOUS
  Filled 2023-12-28 (×2): qty 1

## 2023-12-28 MED ORDER — HYDROMORPHONE BOLUS VIA INFUSION: 1.0000 mg | INTRAVENOUS | Status: DC | PRN

## 2023-12-28 MED ORDER — ACETAMINOPHEN 650 MG RE SUPP: 650.0000 mg | Freq: Four times a day (QID) | RECTAL | Status: DC | PRN

## 2023-12-28 MED ORDER — DIPHENHYDRAMINE HCL 50 MG/ML IJ SOLN: 25.0000 mg | INTRAMUSCULAR | Status: DC | PRN

## 2023-12-28 MED ORDER — HYDROMORPHONE HCL-NACL 50-0.9 MG/50ML-% IV SOLN: 0.0000 mg/h | INTRAVENOUS | Status: DC

## 2023-12-28 MED ORDER — GLYCOPYRROLATE 0.2 MG/ML IJ SOLN: 0.2000 mg | INTRAMUSCULAR | Status: DC | PRN

## 2023-12-28 MED ORDER — LORAZEPAM 2 MG/ML IJ SOLN
1.0000 mg | INTRAMUSCULAR | Status: DC | PRN
  Filled 2023-12-28: qty 1

## 2023-12-28 MED ORDER — HYDROCORTISONE SOD SUC (PF) 100 MG IJ SOLR
100.0000 mg | Freq: Once | INTRAMUSCULAR | Status: AC
  Administered 2023-12-28: 100 mg via INTRAVENOUS
  Filled 2023-12-28: qty 2

## 2023-12-28 MED ORDER — ACETAMINOPHEN 325 MG PO TABS: 650.0000 mg | ORAL_TABLET | Freq: Four times a day (QID) | ORAL | Status: DC | PRN

## 2023-12-28 MED ORDER — POLYVINYL ALCOHOL 1.4 % OP SOLN
1.0000 [drp] | Freq: Four times a day (QID) | OPHTHALMIC | Status: DC | PRN
  Filled 2023-12-28: qty 15

## 2023-12-28 MED ORDER — ONDANSETRON 4 MG PO TBDP: 4.0000 mg | ORAL_TABLET | Freq: Four times a day (QID) | ORAL | Status: DC | PRN

## 2023-12-28 MED ORDER — HALOPERIDOL 0.5 MG PO TABS: 0.5000 mg | ORAL_TABLET | ORAL | Status: DC | PRN

## 2023-12-28 MED ORDER — SODIUM CHLORIDE 0.9% FLUSH
3.0000 mL | Freq: Two times a day (BID) | INTRAVENOUS | Status: DC
  Administered 2023-12-28 (×2): 3 mL via INTRAVENOUS
  Administered 2023-12-29: 10 mL via INTRAVENOUS
  Administered 2023-12-29: 3 mL via INTRAVENOUS
  Administered 2023-12-30: 5 mL via INTRAVENOUS
  Administered 2023-12-30: 3 mL via INTRAVENOUS
  Administered 2023-12-31: 10 mL via INTRAVENOUS

## 2023-12-28 MED ORDER — SODIUM CHLORIDE 0.9% FLUSH: 3.0000 mL | INTRAVENOUS | Status: DC | PRN

## 2023-12-28 MED ORDER — HALOPERIDOL LACTATE 5 MG/ML IJ SOLN
0.5000 mg | INTRAMUSCULAR | Status: DC | PRN
  Administered 2023-12-31: 0.5 mg via INTRAVENOUS
  Filled 2023-12-28: qty 1

## 2023-12-28 MED ORDER — ONDANSETRON HCL 4 MG/2ML IJ SOLN: 4.0000 mg | Freq: Four times a day (QID) | INTRAMUSCULAR | Status: DC | PRN

## 2023-12-28 MED ORDER — HALOPERIDOL LACTATE 2 MG/ML PO CONC: 0.5000 mg | ORAL | Status: DC | PRN

## 2023-12-28 MED ORDER — ALBUMIN HUMAN 25 % IV SOLN
12.5000 g | Freq: Once | INTRAVENOUS | Status: AC
  Administered 2023-12-28: 12.5 g via INTRAVENOUS
  Filled 2023-12-28: qty 50

## 2023-12-28 MED ORDER — LORAZEPAM 1 MG PO TABS
1.0000 mg | ORAL_TABLET | ORAL | Status: DC | PRN
  Administered 2023-12-31: 1 mg via ORAL
  Filled 2023-12-28: qty 1

## 2023-12-28 MED ORDER — GLYCOPYRROLATE 1 MG PO TABS: 1.0000 mg | ORAL_TABLET | ORAL | Status: DC | PRN

## 2023-12-28 MED ORDER — LORAZEPAM 2 MG/ML PO CONC: 1.0000 mg | ORAL | Status: DC | PRN

## 2023-12-28 NOTE — Progress Notes (Signed)
  Chaplain On-Call responded to Spiritual Care Consult Order from Brenna Cam, MD. The request was for end-of-life support for patient and family.  Chaplain met patient's daughter [name] at bedside. [name] described the course of health challenges that her Mother has experienced recently. [name] also stated that the plan of care is pointing toward discharge with Hospice care.  Chaplain provided spiritual and emotional support and prayer.  Chaplain Dean Every., [facility]

## 2023-12-28 NOTE — Progress Notes (Signed)
 SLP Cancellation Note  Patient Details Name: Jill Snow MRN: [id number] DOB: 10-Sep-1933   Cancelled treatment:       Reason Eval/Treat Not Completed: Medical issues which prohibited therapy;Patient's level of consciousness. Per chart review, pt with reduced LOA, responding only to pain. Pt not appropriate for meaningful participation in a swallowing evaluation or cognitive-linguistic evaluation at this time. Will continue efforts as appropriate.  Jill Snow, M.S., CCC-SLP Speech-Language Pathologist [facility] [phone] [place])  [name] 12/28/2023, 7:41 AM

## 2023-12-28 NOTE — IPAL (Signed)
  Interdisciplinary Goals of Care Family Meeting   Date carried out: 12/28/2023  Location of the meeting: Bedside  Member's involved: Physician and Family Member or next of kin  Durable Power of Attorney or acting medical decision maker: Jill Snow    Discussion: We discussed goals of care for Jill Snow   Code status:   Code Status: Do not attempt resuscitation (DNR) - Comfort care   Disposition: In-patient comfort care  Time spent for the meeting: 35 mins    Brenna Cam, MD  12/28/2023, 10:15 AM

## 2023-12-28 NOTE — Progress Notes (Signed)
 SLP Cancellation Note  Patient Details Name: SIOBAHN ADEN MRN: [id number] DOB: 01-Sep-1933   Cancelled treatment:       Reason Eval/Treat Not Completed: Other (comment) (Per chart review, pt's GOC are now comfort. SLP to s/o.)  Dia Forget, M.S., CCC-SLP Speech-Language Pathologist [facility] [phone] [place])  [name] 12/28/2023, 11:40 AM

## 2023-12-28 NOTE — Progress Notes (Signed)
 1      PROGRESS NOTE    Jill Snow  WUJ:[id number] DOB: 28-Dec-1933 DOA: 12/27/2023 PCP: Devorah Fonder, MD   Brief Narrative:   89 y.o. female with medical history significant for PAF on Eliquis , advanced dementia, HLD, HTN, chronic HFpEF, and recurrent UTI who presented to the ED for evaluation of generalized weakness and altered mental status   5/13: Family decided comfort care/hospice   Assessment & Plan:   Principal Problem:   Sepsis secondary to UTI [facility]) Active Problems:   UTI (urinary tract infection)   Acute metabolic encephalopathy   Goals of care, counseling/discussion   Generalized weakness   Aphasia   # Acute metabolic encephalopathy - Has had significant decline in functional status over the last month with recurrent hospitalization - CT head negative, no toxic, electrolyte abnormalities - Likely secondary to recurrent UTI compounded by her advanced dementia   # Hypothermia # Sepsis secondary to UTI # Recurrent UTI # Hypotension # Paroxysmal A-fib # Bradycardia # HFpEF # Physical deconditioning # Advanced dementia # Goals of care  Family/son requested comfort care.  Hospice screening.  They would prefer for her to be placed at hospice home if she qualifies.  DVT prophylaxis: Comfort care      Code Status: DNR/comfort care Family Communication: Discussed with son at bedside Disposition Plan: Waiting for hospice home evaluation and bed availability   Consultants:  Palliative care     Subjective:  Lethargic, unable to provide history son at bedside  Objective: Vitals:   12/28/23 0900 12/28/23 1000 12/28/23 1100 12/28/23 1200  BP: (!) 97/46 (!) 95/48 (!) 102/52 (!) 105/53  Pulse: 61 (!) 59 (!) 59 (!) 59  Resp: 14 13 14 12   Temp:      TempSrc:      SpO2: 94% 100% 100% 99%  Weight:      Height:        Intake/Output Summary (Last 24 hours) at 12/28/2023 1723 Last data filed at 12/28/2023 1500 Gross per 24 hour  Intake  1050.94 ml  Output 600 ml  Net 450.94 ml   Filed Weights   12/27/23 1304 12/27/23 1800  Weight: 75 kg 70.9 kg    Examination:  General: 89 year old chronically ill and lethargic looking elderly woman laying in bed.  CV: Bradycardic. Regular rhythm. No murmurs, rubs, or gallops. No LE edema Pulmonary: Lungs CTAB. Normal effort. No wheezing or rales. Abdo: Soft, benign Skin: Warm and dry. No obvious rash or lesions. Decreased skin turgor. Neuro: Somnolent. Eyes closed but open spontaneously. Follows simple instructions intermittently. Moving all extremities.   Data Reviewed: I have personally reviewed following labs and imaging studies  CBC: Recent Labs  Lab 12/22/23 0453 12/27/23 1223 12/27/23 1722  WBC 5.6 3.5* 3.1*  NEUTROABS  --  2.6  --   HGB 13.1 11.9* 11.7*  HCT 39.8 37.7 35.6*  MCV 96.4 99.5 97.8  PLT 275 221 213   Basic Metabolic Panel: Recent Labs  Lab 12/22/23 0453 12/27/23 1223 12/27/23 1638 12/27/23 1722  NA 141 134*  --  136  K 4.0 5.2*  --  4.8  CL 103 102  --  103  CO2 27 27  --  24  GLUCOSE 102* 134*  --  119*  BUN 39* 20  --  19  CREATININE 1.11* 1.06*  --  0.91  CALCIUM  9.5 9.3  --  9.3  MG 2.5*  --  2.6* 2.5*  PHOS  --   --  3.7 3.7   GFR: Estimated Creatinine Clearance: 40 mL/min (by C-G formula based on SCr of 0.91 mg/dL). Liver Function Tests: Recent Labs  Lab 12/27/23 1223 12/27/23 1722  AST 34 31  ALT 34 31  ALKPHOS 86 80  BILITOT 0.9 1.0  PROT 7.1 6.8  ALBUMIN 3.7 3.4*   No results for input(s): "LIPASE", "AMYLASE" in the last 168 hours. Recent Labs  Lab 12/27/23 1234  AMMONIA <13   Coagulation Profile: Recent Labs  Lab 12/27/23 1247  INR 1.3*   Cardiac Enzymes: No results for input(s): "CKTOTAL", "CKMB", "CKMBINDEX", "TROPONINI" in the last 168 hours. BNP (last 3 results) No results for input(s): "PROBNP" in the last 8760 hours. HbA1C: No results for input(s): "HGBA1C" in the last 72 hours. CBG: Recent Labs   Lab 12/27/23 1706 12/27/23 1803  GLUCAP 117* 125*   Lipid Profile: No results for input(s): "CHOL", "HDL", "LDLCALC", "TRIG", "CHOLHDL", "LDLDIRECT" in the last 72 hours. Thyroid  Function Tests: Recent Labs    12/27/23 1722  TSH 1.241   Anemia Panel: No results for input(s): "VITAMINB12", "FOLATE", "FERRITIN", "TIBC", "IRON", "RETICCTPCT" in the last 72 hours. Sepsis Labs: Recent Labs  Lab 12/27/23 1722 12/27/23 2055  LATICACIDVEN 1.2 1.1    Recent Results (from the past 240 hours)  Urine Culture     Status: None (Preliminary result)   Collection Time: 12/27/23  2:42 PM   Specimen: Urine, Random  Result Value Ref Range Status   Specimen Description   Final    URINE, RANDOM Performed at [facility], [address] [place], [place] [address]    Special Requests   Final    NONE Reflexed from [id number] Performed at [facility], [address] [place], [place] [address]    Culture   Final    CULTURE REINCUBATED FOR BETTER GROWTH Performed at [facility], [address], [place], [place] [address]    Report Status PENDING  Incomplete  MRSA Next Gen by PCR, Nasal     Status: None   Collection Time: 12/27/23  6:08 PM   Specimen: Nasal Mucosa; Nasal Swab  Result Value Ref Range Status   MRSA by PCR Next Gen NOT DETECTED NOT DETECTED Final    Comment: (NOTE) The GeneXpert MRSA Assay (FDA approved for NASAL specimens only), is one component of a comprehensive MRSA colonization surveillance program. It is not intended to diagnose MRSA infection nor to guide or monitor treatment for MRSA infections. Test performance is not FDA approved in patients less than 32 years old. Performed at [facility], [address] [place], [place] [address]          Radiology Studies: MR BRAIN WO CONTRAST Result Date: 12/28/2023 CLINICAL DATA:  Generalized weakness and aphasia EXAM: MRI HEAD WITHOUT CONTRAST TECHNIQUE: Multiplanar, multiecho  pulse sequences of the brain and surrounding structures were obtained without intravenous contrast. COMPARISON:  03/22/2023 FINDINGS: Brain: No acute infarct, mass effect or extra-axial collection. No acute or chronic hemorrhage. There is multifocal hyperintense T2-weighted signal within the white matter. Generalized volume loss. The midline structures are normal. Vascular: Normal flow voids. Skull and upper cervical spine: Normal calvarium and skull base. Visualized upper cervical spine and soft tissues are normal. Sinuses/Orbits:No paranasal sinus fluid levels or advanced mucosal thickening. No mastoid or middle ear effusion. Normal orbits. Markedly motion degraded examination. IMPRESSION: 1. No acute intracranial abnormality. 2. Findings of chronic small vessel ischemia and volume loss. Electronically Signed   By: Ernestina Headland  Richardean Chancellor M.D.   On: 12/28/2023 01:25   CT Head Wo Contrast Result Date: 12/27/2023 CLINICAL DATA:  Mental status change, unknown cause Lethargy. EXAM: CT HEAD WITHOUT CONTRAST TECHNIQUE: Contiguous axial images were obtained from the base of the skull through the vertex without intravenous contrast. RADIATION DOSE REDUCTION: This exam was performed according to the departmental dose-optimization program which includes automated exposure control, adjustment of the mA and/or kV according to patient size and/or use of iterative reconstruction technique. COMPARISON:  12/17/2023 FINDINGS: Brain: No intracranial hemorrhage, mass effect, or midline shift. Stable degree of atrophy and chronic small vessel ischemia. No hydrocephalus. The basilar cisterns are patent. No evidence of territorial infarct or acute ischemia. No extra-axial or intracranial fluid collection. Vascular: Atherosclerosis of skullbase vasculature without hyperdense vessel or abnormal calcification. Skull: No fracture or focal lesion. Sinuses/Orbits: No acute findings. Mild chronic cortical thickening of the right maxillary sinus.  Other: None. IMPRESSION: 1. No acute intracranial abnormality. 2. Stable atrophy and chronic small vessel ischemia. Electronically Signed   By: Chadwick Colonel M.D.   On: 12/27/2023 15:10   DG Chest 2 View Result Date: 12/27/2023 CLINICAL DATA:  Weakness.  Possibly TI EXAM: CHEST - 2 VIEW COMPARISON:  X-ray 12/03/2023 FINDINGS: Enlarged cardiopericardial silhouette. No pneumothorax or edema. There is some linear opacity lung bases likely scar or atelectasis. No consolidation. Known large hiatal hernia. Air-fluid level along the herniated stomach. Degenerative changes along the spine. IMPRESSION: Underinflation.  Basilar atelectasis or scar. Known large hiatal hernia. Electronically Signed   By: Adrianna Horde M.D.   On: 12/27/2023 13:11        Scheduled Meds:  sodium chloride  flush  3-10 mL Intravenous Q12H   Continuous Infusions:   LOS: 0 days    Time spent: 35 minutes    Jancarlo Biermann Mason Sole, MD [facility] Pager [phone]  If 7PM-7AM, please contact night-coverage [email or web address]  12/28/2023, 5:23 PM

## 2023-12-28 NOTE — Consult Note (Signed)
 Consultation Note Date: 12/28/2023   Patient Name: Jill Snow  DOB: June 01, 1934  MRN: [id number]  Age / Sex: 89 y.o., female  PCP: Devorah Fonder, MD Referring Physician: Brenna Cam, MD  Reason for Consultation: Establishing goals of care  HPI/Patient Profile: PER H&P: "Jill Snow is a 89 y.o. female with medical history significant for PAF on Eliquis , advanced dementia, HLD, HTN, chronic HFpEF, and recurrent UTI who presented to the ED for evaluation of generalized weakness and altered mental status. Per son, patient did well for few days after discharge but over the last 3 to 4 days, she has been very weak and refused to work with PT. This morning, patient seemed very lethargic and was not responding to him so he called 911.  Reports that patient has a history of altered mental status in the setting of UTIs. Patient has had 3 hospitalizations in the last 4 weeks due to generalized weakness, altered mental status and UTI."    Clinical Assessment and Goals of Care: Notes and labs reviewed.  Patient has now been transitioned to comfort care by primary team.  Into see patient.  She opens her eyes when asked to do so.  She does not try to communicate otherwise.  Patient's daughter [name] is sitting at bedside.  Please advises that patient is widowed and there are 3 of 4 children living.  She states patient does not have an advance directive and so they will make decisions together.  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  She states her mother has had dementia for around 6 to 7 years.  Daughter discusses that her mother was always a person on the go, and liked to be busy.  [name] shares that her mother stayed with her Monday through Saturday during the day, and she would stay with patient's son at night.  She states her mother was restless and wandering  through the house during the day and late into the night.  She states she has not been happy with her quality of life.  She discusses her decline over the past weeks.  She states her mother has been talking about wanting to go home.  She states her mother is a woman of faith and has been talking about seeing people who have died and wanting to leave this earth.  Daughter discusses that she plan has been put in place for patient to transfer to hospice facility if able.  Patient appears comfortable at this time.    SUMMARY OF RECOMMENDATIONS   Patient transition to comfort care by attending team; patient appears comfortable at this time.  Per family they are waiting to speak with hospice liaison.  Prognosis:  poor      Primary Diagnoses: Present on Admission:  Sepsis secondary to UTI (HCC)  Acute metabolic encephalopathy  UTI (urinary tract infection)   I have reviewed the medical record, interviewed the patient and family, and examined the patient. The following aspects are pertinent.  Past Medical History:  Diagnosis Date   Atrial flutter (HCC)    a. s/p successful TEE/DCCV in 2014; b. TEE 2014 showed EF 45-50%, mild bi-atrial enlargement, mod MR   Dementia (HCC)    Dyspnea    History of chicken pox    Hypercholesterolemia    Hypertension    Mitral regurgitation    a. echo 2014: EF 40-45%, mildly dilated RV, mod reduced RV systolic fxn, mod dilated LA, Mild to mod MR, mod TR, mildly elevated PASP   PAF (paroxysmal atrial fibrillation) (HCC)    a. initial episode 2011; b. not on long term anticoagulation since 06/2013   RBBB (right bundle branch block)    Seasonal allergies    Sleep apnea    a. on CPAP   Thyroid  nodule 05/19/2017   Social History   Socioeconomic History   Marital status: Widowed    Spouse name: Not on file   Number of children: 2   Years of education: Not on file   Highest education level: Not on file  Occupational History   Occupation: REALTOR     Employer: [employer]  Tobacco Use   Smoking status: Never   Smokeless tobacco: Never  Vaping Use   Vaping status: Never Used  Substance and Sexual Activity   Alcohol use: No   Drug use: No   Sexual activity: Never  Other Topics Concern   Not on file  Social History Narrative   Not on file   Social Drivers of Health   Financial Resource Strain: Low Risk  (06/02/2021)   Overall Financial Resource Strain (CARDIA)    Difficulty of Paying Living Expenses: Not hard at all  Food Insecurity: No Food Insecurity (12/27/2023)   Hunger Vital Sign    Worried About Running Out of Food in the Last Year: Never true    Ran Out of Food in the Last Year: Never true  Transportation Needs: No Transportation Needs (12/27/2023)   PRAPARE - Administrator, Civil Service (Medical): No    Lack of Transportation (Non-Medical): No  Physical Activity: Insufficiently Active (06/02/2021)   Exercise Vital Sign    Days of Exercise per Week: 3 days    Minutes of Exercise per Session: 20 min  Stress: No Stress Concern Present (06/02/2021)   [employer] of Occupational Health - Occupational Stress Questionnaire    Feeling of Stress : Not at all  Social Connections: Socially Isolated (12/27/2023)   Social Connection and Isolation Panel [NHANES]    Frequency of Communication with Friends and Family: More than three times a week    Frequency of Social Gatherings with Friends and Family: More than three times a week    Attends Religious Services: Never    Database administrator or Organizations: No    Attends Banker Meetings: Never    Marital Status: Widowed   Family History  Problem Relation Age of Onset   Stroke Mother    Heart attack Mother    Arthritis Mother    Hyperlipidemia Mother    Heart disease Mother    Diabetes Mother    Diabetes Sister    Cancer Daughter    Heart disease Daughter    Diabetes Daughter    Diabetes Son    Diabetes Maternal Grandmother     Diabetes Maternal Grandfather    Scheduled Meds:  sodium chloride  flush  3-10 mL Intravenous Q12H   Continuous Infusions: PRN Meds:.acetaminophen  **OR** acetaminophen , diphenhydrAMINE, glycopyrrolate **OR** glycopyrrolate **  OR** glycopyrrolate, HYDROmorphone  (DILAUDID ) injection, ondansetron  **OR** ondansetron  (ZOFRAN ) IV, polyvinyl alcohol, sodium chloride  flush Medications Prior to Admission:  Prior to Admission medications   Medication Sig Start Date End Date Taking? Authorizing Provider  acetaminophen  (TYLENOL ) 650 MG CR tablet Take 1,300 mg by mouth every 8 (eight) hours as needed for pain.   Yes [provider]  amiodarone  (PACERONE ) 200 MG tablet Take 1 tablet (200 mg total) by mouth 2 (two) times daily. 12/10/23 02/08/24 Yes Ree Candy, MD  apixaban  (ELIQUIS ) 5 MG TABS tablet Take 1 tablet (5 mg total) by mouth 2 (two) times daily. 04/26/23  Yes Gollan, Timothy J, MD  atorvastatin  (LIPITOR ) 40 MG tablet TAKE 1 TABLET BY MOUTH EVERY DAY 07/12/23  Yes Kent Pear, MD  Cholecalciferol  (VITAMIN D -3 PO) Take 1 capsule by mouth at bedtime.   Yes [provider]  diltiazem  (CARDIZEM  CD) 120 MG 24 hr capsule Take 1 capsule (120 mg total) by mouth 2 (two) times daily. 12/10/23 02/08/24 Yes Ree Candy, MD  furosemide  (LASIX ) 20 MG tablet Take 20 mg by mouth.   Yes [provider]  montelukast  (SINGULAIR ) 10 MG tablet TAKE 1 TABLET BY MOUTH EVERY DAY 07/12/23  Yes Kent Pear, MD  omeprazole  (PRILOSEC) 20 MG capsule Take 1 capsule (20 mg total) by mouth daily. 08/05/23  Yes Gollan, Timothy J, MD  polyethylene glycol (MIRALAX  / GLYCOLAX ) 17 g packet Take 17 g by mouth daily as needed for mild constipation. 12/27/22  Yes Doroteo Gasmen, MD  potassium chloride  SA (KLOR-CON  M20) 20 MEQ tablet Take 1 tablet (20 mEq total) by mouth daily. May take an extra tablet (20 Meq) daily as needed with extra lasix . 04/26/23  Yes Gollan, Timothy J, MD   QUEtiapine  (SEROQUEL ) 50 MG tablet Take 1 tablet (50 mg total) by mouth at bedtime. 12/10/23 02/08/24 Yes Ree Candy, MD  VITAMIN E  PO Take 1 capsule by mouth daily.   Yes [provider]   Allergies  Allergen Reactions   Amoxicillin  Nausea Only   Augmentin  [Amoxicillin -Pot Clavulanate] Nausea And Vomiting   Codeine Anaphylaxis   Crestor  [Rosuvastatin ] Other (See Comments)    Myalgias     Penicillins Other (See Comments)    Unknown reaction Tolerates cephalosporins    Review of Systems  Unable to perform ROS   Physical Exam Constitutional:      Comments: Opens her eyes when asked to.  Pulmonary:     Effort: Pulmonary effort is normal.  Skin:    General: Skin is warm and dry.     Vital Signs: BP (!) 105/53   Pulse (!) 59   Temp 98.4 F (36.9 C) (Axillary)   Resp 12   Ht 5\' 6"  (1.676 m)   Wt 70.9 kg   SpO2 99%   BMI 25.23 kg/m  Pain Scale: PAINAD   Pain Score: Asleep   SpO2: SpO2: 99 % O2 Device:SpO2: 99 % O2 Flow Rate: .   IO: Intake/output summary:  Intake/Output Summary (Last 24 hours) at 12/28/2023 1547 Last data filed at 12/28/2023 1500 Gross per 24 hour  Intake 1050.94 ml  Output 600 ml  Net 450.94 ml    LBM: Last BM Date : 12/17/23 Baseline Weight: Weight: 75 kg Most recent weight: Weight: 70.9 kg       Signed by: Meribeth Standard, NP   Please contact Palliative Medicine Team phone at [phone] for questions and concerns.  For individual provider: See Tilford Foley

## 2023-12-28 NOTE — TOC Progression Note (Signed)
 Transition of Care [facility]) - Progression Note    Patient Details  Name: Jill Snow MRN: [id number] Date of Birth: 1934-02-22  Transition of Care [facility]) CM/SW Contact  Kelsey Edman A Adya Wirz, RN Phone Number: 12/28/2023, 3:48 PM  Clinical Narrative:    Chart reviewed.  Noted that family has decided to make patient comfort care.  I have spoken to patient's son [name].  He informs me that he would like to consider comfort care for his mother and consider Hospice services.  He reports that he would like to use [facility].  I have consulted Jackie/Misty with [facility] to see patient and family.    Noted that [facility] will meet with family on tomorrow morning.    TOC will continue to follow for discharge planning.          Expected Discharge Plan and Services                                               Social Determinants of Health (SDOH) Interventions SDOH Screenings   Food Insecurity: No Food Insecurity (12/27/2023)  Housing: Low Risk  (12/27/2023)  Transportation Needs: No Transportation Needs (12/27/2023)  Utilities: Not At Risk (12/27/2023)  Depression (PHQ2-9): Low Risk  (11/10/2022)  Financial Resource Strain: Low Risk  (06/02/2021)  Physical Activity: Insufficiently Active (06/02/2021)  Social Connections: Socially Isolated (12/27/2023)  Stress: No Stress Concern Present (06/02/2021)  Tobacco Use: Low Risk  (12/27/2023)    Readmission Risk Interventions     No data to display

## 2023-12-28 NOTE — Progress Notes (Signed)
 [facility] rm [address] [facility] liaison note:  Referral received for the hospice home. Chart under review and at this time approval is pending.   Unfortunately the hospice home does not have a bed to offer today. Family meeting planned at bedside for tomorrow at 9am.   Please don't hesitate to reach out for any hospice related questions or concerns.  Dwane Gitelman, BSN, RN Hospice hospital liaison [phone]

## 2023-12-29 DIAGNOSIS — N3000 Acute cystitis without hematuria: Secondary | ICD-10-CM

## 2023-12-29 DIAGNOSIS — R531 Weakness: Secondary | ICD-10-CM | POA: Diagnosis not present

## 2023-12-29 DIAGNOSIS — N390 Urinary tract infection, site not specified: Secondary | ICD-10-CM | POA: Diagnosis not present

## 2023-12-29 DIAGNOSIS — A419 Sepsis, unspecified organism: Secondary | ICD-10-CM | POA: Diagnosis not present

## 2023-12-29 DIAGNOSIS — Z7189 Other specified counseling: Secondary | ICD-10-CM | POA: Diagnosis not present

## 2023-12-29 DIAGNOSIS — G9341 Metabolic encephalopathy: Secondary | ICD-10-CM | POA: Diagnosis not present

## 2023-12-29 LAB — URINE CULTURE: Culture: >=100000 — AB

## 2023-12-29 NOTE — Progress Notes (Signed)
 [facility] Liaison Note  Family meeting this am with patient's son and two daughters.   They requested that patient be evaluated for the Hospice Home. Hospice MD did not approved patient for admission to the Hospice Home.  Hospital team and family aware.  HL team will be happy to follow patient's care and re-evaluate if there are changes in patient's condition.  If patient discharges to a LTC facility.  [facility] can follow patient there.    Please call with any Hospice related questions or concerns.    Thank you for the opportunity to participate in this patient's care  [facility] Liaison [phone]

## 2023-12-29 NOTE — Progress Notes (Signed)
 1      PROGRESS NOTE    Jill Snow  ZOX:[id number] DOB: 06/04/1934 DOA: 12/27/2023 PCP: Devorah Fonder, MD   Brief Narrative:   89 y.o. female with medical history significant for PAF on Eliquis , advanced dementia, HLD, HTN, chronic HFpEF, and recurrent UTI who presented to the ED for evaluation of generalized weakness and altered mental status   5/13: Family decided comfort care/hospice  5/14: Patient did not qualify for hospice facility.  Family does not want to take her home with hospice stating that they will not be able to take care of her.  She does not have insurance for long-term care.  Difficult social situation.   Assessment & Plan:   Principal Problem:   Sepsis secondary to UTI [facility]) Active Problems:   UTI (urinary tract infection)   Acute metabolic encephalopathy   Goals of care, counseling/discussion   Generalized weakness   Aphasia   # Acute metabolic encephalopathy - Has had significant decline in functional status over the last month with recurrent hospitalization - CT head negative, no toxic, electrolyte abnormalities - Likely secondary to recurrent UTI compounded by her advanced dementia -Patient is currently full comfort care   # Hypothermia # Sepsis secondary to UTI # Recurrent UTI # Hypotension # Paroxysmal A-fib # Bradycardia # HFpEF # Physical deconditioning # Advanced dementia # Goals of care  Family/son requested comfort care.  Hospice screening.  They would prefer for her to be placed at hospice home if she qualifies.  Unfortunately she did not qualify for hospice facility  DVT prophylaxis: Comfort care     Code Status: DNR/comfort care Family Communication: Talked with daughter on phone. Disposition Plan:   Consultants:  Palliative care  Subjective: Patient was quite lethargic, open eyes when called her name but did not participated with any meaningful history.  Following some simple commands but appears very  weak.  Objective: Vitals:   12/28/23 1100 12/28/23 1200 12/28/23 2025 12/29/23 0802  BP: (!) 102/52 (!) 105/53 (!) 124/54 (!) 99/50  Pulse: (!) 59 (!) 59 (!) 58 61  Resp: 14 12 17 16   Temp:   97.9 F (36.6 C) 98 F (36.7 C)  TempSrc:    Oral  SpO2: 100% 99% 100% 97%  Weight:      Height:        Intake/Output Summary (Last 24 hours) at 12/29/2023 1614 Last data filed at 12/28/2023 2345 Gross per 24 hour  Intake --  Output 200 ml  Net -200 ml   Filed Weights   12/27/23 1304 12/27/23 1800  Weight: 75 kg 70.9 kg    Examination:  General.  Frail and ill-appearing elderly lady, in no acute distress. Pulmonary.  Lungs clear bilaterally, normal respiratory effort. CV.  Regular rate and rhythm, no JVD, rub or murmur. Abdomen.  Soft, nontender, nondistended, BS positive. CNS.  Appears lethargic, following some simple commands Extremities.  No edema, no cyanosis, pulses intact and symmetrical.  Data Reviewed: I have personally reviewed following labs and imaging studies  CBC: Recent Labs  Lab 12/27/23 1223 12/27/23 1722  WBC 3.5* 3.1*  NEUTROABS 2.6  --   HGB 11.9* 11.7*  HCT 37.7 35.6*  MCV 99.5 97.8  PLT 221 213   Basic Metabolic Panel: Recent Labs  Lab 12/27/23 1223 12/27/23 1638 12/27/23 1722  NA 134*  --  136  K 5.2*  --  4.8  CL 102  --  103  CO2 27  --  24  GLUCOSE 134*  --  119*  BUN 20  --  19  CREATININE 1.06*  --  0.91  CALCIUM  9.3  --  9.3  MG  --  2.6* 2.5*  PHOS  --  3.7 3.7   GFR: Estimated Creatinine Clearance: 40 mL/min (by C-G formula based on SCr of 0.91 mg/dL). Liver Function Tests: Recent Labs  Lab 12/27/23 1223 12/27/23 1722  AST 34 31  ALT 34 31  ALKPHOS 86 80  BILITOT 0.9 1.0  PROT 7.1 6.8  ALBUMIN 3.7 3.4*   No results for input(s): "LIPASE", "AMYLASE" in the last 168 hours. Recent Labs  Lab 12/27/23 1234  AMMONIA <13   Coagulation Profile: Recent Labs  Lab 12/27/23 1247  INR 1.3*   Cardiac Enzymes: No results  for input(s): "CKTOTAL", "CKMB", "CKMBINDEX", "TROPONINI" in the last 168 hours. BNP (last 3 results) No results for input(s): "PROBNP" in the last 8760 hours. HbA1C: No results for input(s): "HGBA1C" in the last 72 hours. CBG: Recent Labs  Lab 12/27/23 1706 12/27/23 1803  GLUCAP 117* 125*   Lipid Profile: No results for input(s): "CHOL", "HDL", "LDLCALC", "TRIG", "CHOLHDL", "LDLDIRECT" in the last 72 hours. Thyroid  Function Tests: Recent Labs    12/27/23 1722  TSH 1.241   Anemia Panel: No results for input(s): "VITAMINB12", "FOLATE", "FERRITIN", "TIBC", "IRON", "RETICCTPCT" in the last 72 hours. Sepsis Labs: Recent Labs  Lab 12/27/23 1722 12/27/23 2055  LATICACIDVEN 1.2 1.1    Recent Results (from the past 240 hours)  Urine Culture     Status: Abnormal   Collection Time: 12/27/23  2:42 PM   Specimen: Urine, Random  Result Value Ref Range Status   Specimen Description   Final    URINE, RANDOM Performed at [facility], [address] [place], [place] [address]    Special Requests   Final    NONE Reflexed from [id number] Performed at [facility], [address] [place], [place] [address]    Culture >=100,000 COLONIES/mL ENTEROCOCCUS FAECALIS (A)  Final   Report Status 12/29/2023 FINAL  Final   Organism ID, Bacteria ENTEROCOCCUS FAECALIS (A)  Final      Susceptibility   Enterococcus faecalis - MIC*    AMPICILLIN <=2 SENSITIVE Sensitive     NITROFURANTOIN <=16 SENSITIVE Sensitive     VANCOMYCIN  1 SENSITIVE Sensitive     * >=100,000 COLONIES/mL ENTEROCOCCUS FAECALIS  MRSA Next Gen by PCR, Nasal     Status: None   Collection Time: 12/27/23  6:08 PM   Specimen: Nasal Mucosa; Nasal Swab  Result Value Ref Range Status   MRSA by PCR Next Gen NOT DETECTED NOT DETECTED Final    Comment: (NOTE) The GeneXpert MRSA Assay (FDA approved for NASAL specimens only), is one component of a comprehensive MRSA colonization surveillance program. It is not  intended to diagnose MRSA infection nor to guide or monitor treatment for MRSA infections. Test performance is not FDA approved in patients less than 64 years old. Performed at [facility], [address] [place], [place] [address]      Radiology Studies: MR BRAIN WO CONTRAST Result Date: 12/28/2023 CLINICAL DATA:  Generalized weakness and aphasia EXAM: MRI HEAD WITHOUT CONTRAST TECHNIQUE: Multiplanar, multiecho pulse sequences of the brain and surrounding structures were obtained without intravenous contrast. COMPARISON:  03/22/2023 FINDINGS: Brain: No acute infarct, mass effect or extra-axial collection. No acute or chronic hemorrhage. There is multifocal hyperintense T2-weighted signal within the white matter. Generalized volume loss. The midline  structures are normal. Vascular: Normal flow voids. Skull and upper cervical spine: Normal calvarium and skull base. Visualized upper cervical spine and soft tissues are normal. Sinuses/Orbits:No paranasal sinus fluid levels or advanced mucosal thickening. No mastoid or middle ear effusion. Normal orbits. Markedly motion degraded examination. IMPRESSION: 1. No acute intracranial abnormality. 2. Findings of chronic small vessel ischemia and volume loss. Electronically Signed   By: Juanetta Nordmann M.D.   On: 12/28/2023 01:25   Scheduled Meds:  sodium chloride  flush  3-10 mL Intravenous Q12H   Continuous Infusions:   LOS: 1 day    Time spent: 50 minutes  This record has been created using Conservation officer, historic buildings. Errors have been sought and corrected,but may not always be located. Such creation errors do not reflect on the standard of care.   Luna Salinas, MD [facility] Pager [phone]  If 7PM-7AM, please contact night-coverage [email or web address]  12/29/2023, 4:14 PM

## 2023-12-29 NOTE — TOC Progression Note (Signed)
 Transition of Care [facility]) - Progression Note    Patient Details  Name: Jill Snow MRN: [id number] Date of Birth: 17-Oct-1933  Transition of Care [facility]) CM/SW Contact  Crayton Docker, RN 12/29/2023, 1:29 PM  Clinical Narrative:     Alert received from Jackie, [facility] regarding hospice referral. Per Fredrik Jensen, patient was not approved for hospice home. Per Jackie, [facility] can follow patient for status changes. CM to patient's room, patient's son, [name] is at patient's bedside. CM and patient discussed discharge care planning. Per patient's son, cannot take patient home and can no longer provide caregiver services for patient. Noted, no Medicaid on file for patient for long term care. CM and patient discussed homecare services, [employer], applications. Patient's son, [name] states cannot afford expenses to provide homecare services. Patient's son, requests CM discuss discharge care planning with patient's daughter, [name]. Patient's son, left room. CM will follow up .  Expected Discharge Plan and Services    Home with family/caregiver support   Social Determinants of Health (SDOH) Interventions SDOH Screenings   Food Insecurity: No Food Insecurity (12/27/2023)  Housing: Low Risk  (12/27/2023)  Transportation Needs: No Transportation Needs (12/27/2023)  Utilities: Not At Risk (12/27/2023)  Depression (PHQ2-9): Low Risk  (11/10/2022)  Financial Resource Strain: Low Risk  (06/02/2021)  Physical Activity: Insufficiently Active (06/02/2021)  Social Connections: Socially Isolated (12/27/2023)  Stress: No Stress Concern Present (06/02/2021)  Tobacco Use: Low Risk  (12/27/2023)    Readmission Risk Interventions     No data to display

## 2023-12-29 NOTE — Plan of Care (Signed)
  Problem: Education: Goal: Knowledge of General Education information will improve Description: Including pain rating scale, medication(s)/side effects and non-pharmacologic comfort measures Outcome: Progressing   Problem: Health Behavior/Discharge Planning: Goal: Ability to manage health-related needs will improve Outcome: Progressing   Problem: Clinical Measurements: Goal: Ability to maintain clinical measurements within normal limits will improve Outcome: Progressing Goal: Will remain free from infection Outcome: Progressing Goal: Diagnostic test results will improve Outcome: Progressing Goal: Respiratory complications will improve Outcome: Progressing Goal: Cardiovascular complication will be avoided Outcome: Progressing   Problem: Activity: Goal: Risk for activity intolerance will decrease Outcome: Progressing   Problem: Nutrition: Goal: Adequate nutrition will be maintained Outcome: Progressing   Problem: Coping: Goal: Level of anxiety will decrease Outcome: Progressing   Problem: Elimination: Goal: Will not experience complications related to bowel motility Outcome: Progressing Goal: Will not experience complications related to urinary retention Outcome: Progressing   Problem: Pain Managment: Goal: General experience of comfort will improve and/or be controlled Outcome: Progressing   Problem: Safety: Goal: Ability to remain free from injury will improve Outcome: Progressing   Problem: Skin Integrity: Goal: Risk for impaired skin integrity will decrease Outcome: Progressing   Problem: Clinical Measurements: Goal: Quality of life will improve Outcome: Progressing   Problem: Respiratory: Goal: Verbalizations of increased ease of respirations will increase Outcome: Progressing   Problem: Education: Goal: Knowledge of the prescribed therapeutic regimen will improve Outcome: Progressing   Problem: Role Relationship: Goal: Family's ability to cope with  current situation will improve Outcome: Progressing Goal: Ability to verbalize concerns, feelings, and thoughts to partner or family member will improve Outcome: Progressing   Problem: Pain Management: Goal: Satisfaction with pain management regimen will improve Outcome: Progressing

## 2023-12-29 NOTE — Progress Notes (Signed)
 Daily Progress Note   Patient Name: Jill Snow       Date: 12/29/2023 DOB: 03/16/1934  Age: 89 y.o. MRN#: [id number] Attending Physician: Luna Salinas, MD Primary Care Physician: Devorah Fonder, MD Admit Date: 12/27/2023  Reason for Consultation/Follow-up: Establishing goals of care  Subjective: Notes reviewed.  Patient was shifted to comfort care by attending team yesterday.  Into see patient.  She is currently resting in bed with eyes closed at this time, no family at bedside.  No distress noted at this time.  Upon speaking to her she opened her eyes and smiled.  She denies complaint at this time.  Goals are currently set for comfort focused care as implemented by attending team and discharge with hospice to follow.  PMT will shadow for needs.  Length of Stay: 1  Current Medications: Scheduled Meds:   sodium chloride  flush  3-10 mL Intravenous Q12H    Continuous Infusions:   PRN Meds: acetaminophen  **OR** acetaminophen , diphenhydrAMINE, glycopyrrolate **OR** glycopyrrolate **OR** glycopyrrolate, haloperidol  **OR** haloperidol  **OR** haloperidol  lactate, HYDROmorphone  (DILAUDID ) injection, LORazepam  **OR** LORazepam  **OR** LORazepam , ondansetron  **OR** ondansetron  (ZOFRAN ) IV, polyvinyl alcohol, sodium chloride  flush  Physical Exam Pulmonary:     Effort: Pulmonary effort is normal.  Abdominal:     Tenderness: There is no abdominal tenderness.  Skin:    General: Skin is warm and dry.  Neurological:     Mental Status: She is alert.             Vital Signs: BP (!) 99/50 (BP Location: Left Arm)   Pulse 61   Temp 98 F (36.7 C) (Oral)   Resp 16   Ht 5\' 6"  (1.676 m)   Wt 70.9 kg   SpO2 97%   BMI 25.23 kg/m  SpO2: SpO2: 97 % O2 Device: O2 Device: Room Air O2  Flow Rate:    Intake/output summary:  Intake/Output Summary (Last 24 hours) at 12/29/2023 1200 Last data filed at 12/28/2023 2345 Gross per 24 hour  Intake --  Output 500 ml  Net -500 ml   LBM: Last BM Date : 12/17/23 Baseline Weight: Weight: 75 kg Most recent weight: Weight: 70.9 kg      Patient Active Problem List   Diagnosis Date Noted   Aphasia 12/28/2023   Generalized weakness 12/27/2023   Acute metabolic  encephalopathy 12/22/2023   Impaired ambulation 12/17/2023   Chronic a-fib (HCC) 12/10/2023   Goals of care, counseling/discussion 08/23/2023   Sepsis secondary to UTI (HCC) 12/26/2022   UTI (urinary tract infection) 12/25/2022   Venous insufficiency 11/10/2022   Late onset Alzheimer's dementia with behavioral disturbance (HCC) 06/03/2021   Tremor of right hand 05/26/2021   Dry eyes 12/24/2020   Chronic diastolic CHF (congestive heart failure) (HCC) 12/16/2020   History of non anemic vitamin B12 deficiency 06/27/2018   Aortic atherosclerosis (HCC) 04/06/2018   Allergic rhinitis 05/19/2017   Lung nodule 12/10/2016   Fatty liver 04/22/2016   Neuropathy 02/11/2016   Mitral regurgitation    Vitamin D  deficiency 08/29/2014   Obstructive sleep apnea on CPAP 03/07/2014   HTN (hypertension)    Hypercholesterolemia    RBBB (right bundle branch block)     Palliative Care Assessment & Plan    Recommendations/Plan: Plans in place for patient To Discharge with Hospice care.  Code Status:    Code Status Orders  (From admission, onward)           Start     Ordered   12/28/23 2025  Do not attempt resuscitation (DNR) - Comfort care  Continuous       Question Answer Comment  If patient has no pulse and is not breathing Do Not Attempt Resuscitation   In Pre-Arrest Conditions (Patient Is Breathing and Has a Pulse) Provide comfort measures. Relieve any mechanical airway obstruction. Avoid transfer unless required for comfort.   Consent: Discussion documented in EHR or  advanced directives reviewed      12/28/23 2026           Code Status History     Date Active Date Inactive Code Status Order ID Comments User Context   12/28/2023 1015 12/28/2023 2026 Do not attempt resuscitation (DNR) - Comfort care [id number]  Brenna Cam, MD Inpatient   12/27/2023 1537 12/28/2023 1015 Limited: Do not attempt resuscitation (DNR) -DNR-LIMITED -Do Not Intubate/DNI  [id number]  Vita Grip, MD ED   12/17/2023 1159 12/23/2023 0101 Limited: Do not attempt resuscitation (DNR) -DNR-LIMITED -Do Not Intubate/DNI  [id number]  [place], MD ED   11/30/2023 0102 12/11/2023 0103 Limited: Do not attempt resuscitation (DNR) -DNR-LIMITED -Do Not Intubate/DNI  [id number]  Fidencio Hue, MD ED   11/30/2023 0012 11/30/2023 0102 Full Code [id number]  Fidencio Hue, MD ED   02/10/2023 1425 02/14/2023 1531 Full Code [id number]  [place], MD ED   12/25/2022 1615 12/27/2022 2053 Full Code [id number]  Johnetta Nab, MD ED   12/16/2020 1459 12/17/2020 1610 Full Code [id number]  Lorita Rosa, MD ED   04/23/2020 1548 04/25/2020 2239 Full Code [id number]  Cleve Dale, MD ED   11/30/2016 2340 12/03/2016 2213 Full Code [id number]  Enrigue Harvard, DO Inpatient       Thank you for allowing the Palliative Medicine Team to assist in the care of this patient.    Meribeth Standard, NP  Please contact Palliative Medicine Team phone at [phone] for questions and concerns.

## 2023-12-30 DIAGNOSIS — A419 Sepsis, unspecified organism: Secondary | ICD-10-CM | POA: Diagnosis not present

## 2023-12-30 DIAGNOSIS — N390 Urinary tract infection, site not specified: Secondary | ICD-10-CM | POA: Diagnosis not present

## 2023-12-30 NOTE — Progress Notes (Signed)
 1      PROGRESS NOTE    Jill Snow  BJY:[id number] DOB: 02-17-1934 DOA: 12/27/2023 PCP: Devorah Fonder, MD   Brief Narrative:   89 y.o. female with medical history significant for PAF on Eliquis , advanced dementia, HLD, HTN, chronic HFpEF, and recurrent UTI who presented to the ED for evaluation of generalized weakness and altered mental status   5/13: Family decided comfort care/hospice  5/14: Patient did not qualify for hospice facility.  Family does not want to take her home with hospice stating that they will not be able to take care of her.  She does not have insurance for long-term care.  Difficult social situation.  5/15: Patient was little more alert and responsive today, asking for food.  Did not qualify for hospice facility and will be going home tomorrow with hospice health.  Family is preparing the house for her.   Assessment & Plan:   Principal Problem:   Sepsis secondary to UTI [facility]) Active Problems:   UTI (urinary tract infection)   Acute metabolic encephalopathy   Goals of care, counseling/discussion   Generalized weakness   Aphasia   # Acute metabolic encephalopathy - Has had significant decline in functional status over the last month with recurrent hospitalization - CT head negative, no toxic, electrolyte abnormalities - Likely secondary to recurrent UTI compounded by her advanced dementia -Patient is currently full comfort care   # Hypothermia # Sepsis secondary to UTI # Recurrent UTI # Hypotension # Paroxysmal A-fib # Bradycardia # HFpEF # Physical deconditioning # Advanced dementia # Goals of care  Family/son requested comfort care.  Hospice screening.  They would prefer for her to be placed at hospice home if she qualifies.  Unfortunately she did not qualify for hospice facility Will be going home tomorrow with hospice  DVT prophylaxis: Comfort care     Code Status: DNR/comfort care Family Communication: Talked with daughter on  phone. Disposition Plan:   Consultants:  Palliative care  Subjective: Patient is more alert and sitting upright when seen today.  Quite pleasant elderly lady, asking for food and feeling hungry.  She wants to go home.  Objective: Vitals:   12/28/23 2025 12/29/23 0802 12/30/23 0000 12/30/23 0744  BP: (!) 124/54 (!) 99/50 (!) 135/56 (!) 114/45  Pulse: (!) 58 61 (!) 50 (!) 54  Resp: 17 16  16   Temp: 97.9 F (36.6 C) 98 F (36.7 C) 97.6 F (36.4 C) (!) 97.5 F (36.4 C)  TempSrc:  Oral  Oral  SpO2: 100% 97% 98% 100%  Weight:      Height:        Intake/Output Summary (Last 24 hours) at 12/30/2023 1254 Last data filed at 12/30/2023 0865 Gross per 24 hour  Intake 5 ml  Output 1 ml  Net 4 ml   Filed Weights   12/27/23 1304 12/27/23 1800  Weight: 75 kg 70.9 kg    Examination:  General.  Frail elderly lady, in no acute distress. Pulmonary.  Lungs clear bilaterally, normal respiratory effort. CV.  Regular rate and rhythm, no JVD, rub or murmur. Abdomen.  Soft, nontender, nondistended, BS positive. CNS.  Alert and oriented to self.  No focal neurologic deficit. Extremities.  No edema, no cyanosis, pulses intact and symmetrical.  Data Reviewed: I have personally reviewed following labs and imaging studies  CBC: Recent Labs  Lab 12/27/23 1223 12/27/23 1722  WBC 3.5* 3.1*  NEUTROABS 2.6  --   HGB 11.9* 11.7*  HCT  37.7 35.6*  MCV 99.5 97.8  PLT 221 213   Basic Metabolic Panel: Recent Labs  Lab 12/27/23 1223 12/27/23 1638 12/27/23 1722  NA 134*  --  136  K 5.2*  --  4.8  CL 102  --  103  CO2 27  --  24  GLUCOSE 134*  --  119*  BUN 20  --  19  CREATININE 1.06*  --  0.91  CALCIUM  9.3  --  9.3  MG  --  2.6* 2.5*  PHOS  --  3.7 3.7   GFR: Estimated Creatinine Clearance: 40 mL/min (by C-G formula based on SCr of 0.91 mg/dL). Liver Function Tests: Recent Labs  Lab 12/27/23 1223 12/27/23 1722  AST 34 31  ALT 34 31  ALKPHOS 86 80  BILITOT 0.9 1.0  PROT 7.1  6.8  ALBUMIN 3.7 3.4*   No results for input(s): "LIPASE", "AMYLASE" in the last 168 hours. Recent Labs  Lab 12/27/23 1234  AMMONIA <13   Coagulation Profile: Recent Labs  Lab 12/27/23 1247  INR 1.3*   Cardiac Enzymes: No results for input(s): "CKTOTAL", "CKMB", "CKMBINDEX", "TROPONINI" in the last 168 hours. BNP (last 3 results) No results for input(s): "PROBNP" in the last 8760 hours. HbA1C: No results for input(s): "HGBA1C" in the last 72 hours. CBG: Recent Labs  Lab 12/27/23 1706 12/27/23 1803  GLUCAP 117* 125*   Lipid Profile: No results for input(s): "CHOL", "HDL", "LDLCALC", "TRIG", "CHOLHDL", "LDLDIRECT" in the last 72 hours. Thyroid  Function Tests: Recent Labs    12/27/23 1722  TSH 1.241   Anemia Panel: No results for input(s): "VITAMINB12", "FOLATE", "FERRITIN", "TIBC", "IRON", "RETICCTPCT" in the last 72 hours. Sepsis Labs: Recent Labs  Lab 12/27/23 1722 12/27/23 2055  LATICACIDVEN 1.2 1.1    Recent Results (from the past 240 hours)  Urine Culture     Status: Abnormal   Collection Time: 12/27/23  2:42 PM   Specimen: Urine, Random  Result Value Ref Range Status   Specimen Description   Final    URINE, RANDOM Performed at [facility], [address] [place], [place] [address]    Special Requests   Final    NONE Reflexed from [id number] Performed at [facility], [address] [place], [place] [address]    Culture >=100,000 COLONIES/mL ENTEROCOCCUS FAECALIS (A)  Final   Report Status 12/29/2023 FINAL  Final   Organism ID, Bacteria ENTEROCOCCUS FAECALIS (A)  Final      Susceptibility   Enterococcus faecalis - MIC*    AMPICILLIN <=2 SENSITIVE Sensitive     NITROFURANTOIN <=16 SENSITIVE Sensitive     VANCOMYCIN  1 SENSITIVE Sensitive     * >=100,000 COLONIES/mL ENTEROCOCCUS FAECALIS  MRSA Next Gen by PCR, Nasal     Status: None   Collection Time: 12/27/23  6:08 PM   Specimen: Nasal Mucosa; Nasal Swab  Result Value Ref  Range Status   MRSA by PCR Next Gen NOT DETECTED NOT DETECTED Final    Comment: (NOTE) The GeneXpert MRSA Assay (FDA approved for NASAL specimens only), is one component of a comprehensive MRSA colonization surveillance program. It is not intended to diagnose MRSA infection nor to guide or monitor treatment for MRSA infections. Test performance is not FDA approved in patients less than 56 years old. Performed at [facility], [address] [place], [place] [address]      Radiology Studies: No results found.  Scheduled Meds:  sodium chloride  flush  3-10 mL Intravenous  Q12H   Continuous Infusions:   LOS: 2 days    Time spent: 45 minutes  This record has been created using Conservation officer, historic buildings. Errors have been sought and corrected,but may not always be located. Such creation errors do not reflect on the standard of care.   Luna Salinas, MD [facility] Pager [phone]  If 7PM-7AM, please contact night-coverage [email or web address]  12/30/2023, 12:54 PM

## 2023-12-30 NOTE — Progress Notes (Addendum)
 [facility] Hospice Liaison Note  Family has decided that they will take patient to daughter's home [facility])  with Hospice follow up tomorrow. Patient will need a hospital bed, over bed table and WC.  Patient already has a lift chair, BSC, walker and shower chair.  Referral for Hospice follow up at home has been submitted. Hospital team notified.       Please send signed and completed DNR home with the patient/family.  Please provide prescriptions at discharge as needed to ensure ongoing symptom management.   [facility] information and contact numbers given to daughter.   Above information shared with Lysbeth Sauger, RN, Transitions of Care Manager.     Please call with any Hospice related questions or concerns.  Thank you for the opportunity to participate in this patient's care.  Helmut Lobe, [facility] Liaison [phone]

## 2023-12-30 NOTE — Plan of Care (Signed)
  Problem: Coping: Goal: Level of anxiety will decrease Outcome: Progressing   Problem: Elimination: Goal: Will not experience complications related to bowel motility Outcome: Progressing Goal: Will not experience complications related to urinary retention Outcome: Progressing   Problem: Pain Managment: Goal: General experience of comfort will improve and/or be controlled Outcome: Progressing   Problem: Education: Goal: Knowledge of the prescribed therapeutic regimen will improve Outcome: Progressing   Problem: Coping: Goal: Ability to identify and develop effective coping behavior will improve Outcome: Progressing   Problem: Clinical Measurements: Goal: Quality of life will improve Outcome: Progressing   Problem: Role Relationship: Goal: Family's ability to cope with current situation will improve Outcome: Progressing

## 2023-12-30 NOTE — Progress Notes (Signed)
 Daily Progress Note   Patient Name: Jill Snow       Date: 12/30/2023 DOB: 07/04/1934  Age: 89 y.o. MRN#: [id number] Attending Physician: Luna Salinas, MD Primary Care Physician: Devorah Fonder, MD Admit Date: 12/27/2023  Reason for Consultation/Follow-up: Establishing goals of care  Subjective: Notes reviewed.  In to see patient to assess for needs or issues with symptom management.  She is currently resting in bed with son at bedside.  She is awake and alert and smiling today.  She denies complaint at this time.  Son discusses plans for patient to go to his sister's (patient's daughter) house moving forward.  Recapped plans for hospice level care.  Discussed plans with patient, and patient nor son had any questions at this time.  Spoke with attending regarding discussions and updates.  PMT will shadow peripherally for needs.  Length of Stay: 2  Current Medications: Scheduled Meds:   sodium chloride  flush  3-10 mL Intravenous Q12H    Continuous Infusions:   PRN Meds: acetaminophen  **OR** acetaminophen , diphenhydrAMINE, glycopyrrolate **OR** glycopyrrolate **OR** glycopyrrolate, haloperidol  **OR** haloperidol  **OR** haloperidol  lactate, HYDROmorphone  (DILAUDID ) injection, LORazepam  **OR** LORazepam  **OR** LORazepam , ondansetron  **OR** ondansetron  (ZOFRAN ) IV, polyvinyl alcohol, sodium chloride  flush  Physical Exam Pulmonary:     Effort: Pulmonary effort is normal.  Skin:    General: Skin is warm and dry.  Neurological:     Mental Status: She is alert.             Vital Signs: BP (!) 114/45 (BP Location: Left Arm)   Pulse (!) 54   Temp (!) 97.5 F (36.4 C) (Oral)   Resp 16   Ht 5\' 6"  (1.676 m)   Wt 70.9 kg   SpO2 100%   BMI 25.23 kg/m  SpO2: SpO2: 100  % O2 Device: O2 Device: Nasal Cannula O2 Flow Rate:    Intake/output summary:  Intake/Output Summary (Last 24 hours) at 12/30/2023 1157 Last data filed at 12/30/2023 0903 Gross per 24 hour  Intake 5 ml  Output 1 ml  Net 4 ml   LBM: Last BM Date : 12/17/23 Baseline Weight: Weight: 75 kg Most recent weight: Weight: 70.9 kg        Patient Active Problem List   Diagnosis Date Noted   Aphasia 12/28/2023   Generalized weakness 12/27/2023  Acute metabolic encephalopathy 12/22/2023   Impaired ambulation 12/17/2023   Chronic a-fib (HCC) 12/10/2023   Goals of care, counseling/discussion 08/23/2023   Sepsis secondary to UTI (HCC) 12/26/2022   UTI (urinary tract infection) 12/25/2022   Venous insufficiency 11/10/2022   Late onset Alzheimer's dementia with behavioral disturbance (HCC) 06/03/2021   Tremor of right hand 05/26/2021   Dry eyes 12/24/2020   Chronic diastolic CHF (congestive heart failure) (HCC) 12/16/2020   History of non anemic vitamin B12 deficiency 06/27/2018   Aortic atherosclerosis (HCC) 04/06/2018   Allergic rhinitis 05/19/2017   Lung nodule 12/10/2016   Fatty liver 04/22/2016   Neuropathy 02/11/2016   Mitral regurgitation    Vitamin D  deficiency 08/29/2014   Obstructive sleep apnea on CPAP 03/07/2014   HTN (hypertension)    Hypercholesterolemia    RBBB (right bundle branch block)     Palliative Care Assessment & Plan    Recommendations/Plan: Patient and family working with [facility] and hospice for discharge.  PMT will shadow peripherally for needs as goals are set and patient is comfortable.   Code Status:    Code Status Orders  (From admission, onward)           Start     Ordered   12/28/23 2025  Do not attempt resuscitation (DNR) - Comfort care  Continuous       Question Answer Comment  If patient has no pulse and is not breathing Do Not Attempt Resuscitation   In Pre-Arrest Conditions (Patient Is Breathing and Has a Pulse) Provide comfort  measures. Relieve any mechanical airway obstruction. Avoid transfer unless required for comfort.   Consent: Discussion documented in EHR or advanced directives reviewed      12/28/23 2026           Code Status History     Date Active Date Inactive Code Status Order ID Comments User Context   12/28/2023 1015 12/28/2023 2026 Do not attempt resuscitation (DNR) - Comfort care [id number]  Brenna Cam, MD Inpatient   12/27/2023 1537 12/28/2023 1015 Limited: Do not attempt resuscitation (DNR) -DNR-LIMITED -Do Not Intubate/DNI  [id number]  Vita Grip, MD ED   12/17/2023 1159 12/23/2023 0101 Limited: Do not attempt resuscitation (DNR) -DNR-LIMITED -Do Not Intubate/DNI  [id number]  [place], MD ED   11/30/2023 0102 12/11/2023 0103 Limited: Do not attempt resuscitation (DNR) -DNR-LIMITED -Do Not Intubate/DNI  [id number]  Fidencio Hue, MD ED   11/30/2023 0012 11/30/2023 0102 Full Code [id number]  Fidencio Hue, MD ED   02/10/2023 1425 02/14/2023 1531 Full Code [id number]  [place], MD ED   12/25/2022 1615 12/27/2022 2053 Full Code [id number]  Johnetta Nab, MD ED   12/16/2020 1459 12/17/2020 1610 Full Code [id number]  Lorita Rosa, MD ED   04/23/2020 1548 04/25/2020 2239 Full Code [id number]  Cleve Dale, MD ED   11/30/2016 2340 12/03/2016 2213 Full Code [id number]  Enrigue Harvard, DO Inpatient       Thank you for allowing the Palliative Medicine Team to assist in the care of this patient.    Meribeth Standard, NP  Please contact Palliative Medicine Team phone at [phone] for questions and concerns.

## 2023-12-31 DIAGNOSIS — R4182 Altered mental status, unspecified: Secondary | ICD-10-CM

## 2023-12-31 DIAGNOSIS — N390 Urinary tract infection, site not specified: Secondary | ICD-10-CM | POA: Diagnosis not present

## 2023-12-31 DIAGNOSIS — Z7189 Other specified counseling: Secondary | ICD-10-CM | POA: Diagnosis not present

## 2023-12-31 DIAGNOSIS — A419 Sepsis, unspecified organism: Secondary | ICD-10-CM | POA: Diagnosis not present

## 2023-12-31 DIAGNOSIS — R531 Weakness: Secondary | ICD-10-CM | POA: Diagnosis not present

## 2023-12-31 DIAGNOSIS — R4701 Aphasia: Secondary | ICD-10-CM | POA: Diagnosis not present

## 2023-12-31 MED ORDER — HALOPERIDOL LACTATE 2 MG/ML PO CONC: 0.5000 mg | ORAL | 0 refills | Status: AC | PRN

## 2023-12-31 MED ORDER — LORAZEPAM 2 MG/ML PO CONC: 1.0000 mg | ORAL | 0 refills | Status: AC | PRN

## 2023-12-31 MED ORDER — ONDANSETRON 4 MG PO TBDP: 4.0000 mg | ORAL_TABLET | Freq: Four times a day (QID) | ORAL | 0 refills | Status: AC | PRN

## 2023-12-31 MED ORDER — POLYVINYL ALCOHOL 1.4 % OP SOLN: 1.0000 [drp] | Freq: Four times a day (QID) | OPHTHALMIC | 0 refills | Status: AC | PRN

## 2023-12-31 MED ORDER — GLYCOPYRROLATE 1 MG PO TABS: 1.0000 mg | ORAL_TABLET | ORAL | 2 refills | Status: AC | PRN

## 2023-12-31 MED ORDER — HYDROMORPHONE HCL 2 MG PO TABS: 2.0000 mg | ORAL_TABLET | ORAL | 0 refills | Status: AC | PRN

## 2023-12-31 NOTE — Plan of Care (Signed)
  Problem: Education: Goal: Knowledge of General Education information will improve Description: Including pain rating scale, medication(s)/side effects and non-pharmacologic comfort measures Outcome: Progressing   Problem: Health Behavior/Discharge Planning: Goal: Ability to manage health-related needs will improve Outcome: Progressing   Problem: Clinical Measurements: Goal: Ability to maintain clinical measurements within normal limits will improve Outcome: Progressing Goal: Will remain free from infection Outcome: Progressing Goal: Diagnostic test results will improve Outcome: Progressing Goal: Respiratory complications will improve Outcome: Progressing Goal: Cardiovascular complication will be avoided Outcome: Progressing   Problem: Activity: Goal: Risk for activity intolerance will decrease Outcome: Progressing   Problem: Nutrition: Goal: Adequate nutrition will be maintained Outcome: Progressing   Problem: Coping: Goal: Level of anxiety will decrease Outcome: Progressing   Problem: Elimination: Goal: Will not experience complications related to bowel motility Outcome: Progressing Goal: Will not experience complications related to urinary retention Outcome: Progressing   Problem: Pain Managment: Goal: General experience of comfort will improve and/or be controlled Outcome: Progressing   Problem: Safety: Goal: Ability to remain free from injury will improve Outcome: Progressing   Problem: Skin Integrity: Goal: Risk for impaired skin integrity will decrease Outcome: Progressing   Problem: Education: Goal: Knowledge of the prescribed therapeutic regimen will improve Outcome: Progressing   Problem: Coping: Goal: Ability to identify and develop effective coping behavior will improve Outcome: Progressing   Problem: Clinical Measurements: Goal: Quality of life will improve Outcome: Progressing   Problem: Role Relationship: Goal: Family's ability to cope  with current situation will improve Outcome: Progressing Goal: Ability to verbalize concerns, feelings, and thoughts to partner or family member will improve Outcome: Progressing   Problem: Pain Management: Goal: Satisfaction with pain management regimen will improve Outcome: Progressing

## 2023-12-31 NOTE — Discharge Summary (Signed)
 Physician Discharge Summary   Patient: Jill Snow MRN: [id number] DOB: Apr 11, 1934  Admit date:     12/27/2023  Discharge date: 12/31/23  Discharge Physician: Luna Salinas   PCP: Gollan, Timothy J, MD   Recommendations at discharge:  Patient is being discharged home with hospice care  Discharge Diagnoses: Principal Problem:   Sepsis secondary to UTI [facility]) Active Problems:   UTI (urinary tract infection)   Acute metabolic encephalopathy   Altered mental status   Goals of care, counseling/discussion   Weakness   Generalized weakness   Aphasia   Hospital Course: 89 y.o. female with medical history significant for PAF on Eliquis , advanced dementia, HLD, HTN, chronic HFpEF, and recurrent UTI who presented to the ED for evaluation of generalized weakness and altered mental status.  Patient was initially treated for sepsis secondary to UTI.  Has an history of recurrent UTIs.  Urine cultures with pansensitive Enterococcus faecalis.  Patient received 2 days of ceftriaxone . Due to overall declining health and recurrent hospitalization family decided to transition her to full comfort care and withdrawing active medical management.  Comfort care measures were started and patient remained stable.  Intermittent agitation more consistent with sundowning.  She was evaluated by hospice services as family was interested in hospice facility but unfortunately did not qualify.  Patient is currently medically stable and is being discharged home with hospice services.  End-of-life care comfort medications were provided and hospice services will take over from here.  Consultants: Palliative care Procedures performed: None Disposition: Hospice care Diet recommendation:  Discharge Diet Orders (From admission, onward)     Start     Ordered   12/31/23 0000  Diet - low sodium heart healthy        12/31/23 1017           Regular diet DISCHARGE MEDICATION: Allergies as of 12/31/2023        Reactions   Amoxicillin  Nausea Only   Augmentin  [amoxicillin -pot Clavulanate] Nausea And Vomiting   Codeine Anaphylaxis   Crestor  [rosuvastatin ] Other (See Comments)   Myalgias    Penicillins Other (See Comments)   Unknown reaction Tolerates cephalosporins         Medication List     STOP taking these medications    amiodarone  200 MG tablet Commonly known as: PACERONE    apixaban  5 MG Tabs tablet Commonly known as: Eliquis    atorvastatin  40 MG tablet Commonly known as: LIPITOR    diltiazem  120 MG 24 hr capsule Commonly known as: CARDIZEM  CD   furosemide  20 MG tablet Commonly known as: LASIX    montelukast  10 MG tablet Commonly known as: SINGULAIR    potassium chloride  SA 20 MEQ tablet Commonly known as: Klor-Con  M20   QUEtiapine  50 MG tablet Commonly known as: SEROQUEL    VITAMIN D -3 PO   VITAMIN E  PO       TAKE these medications    acetaminophen  650 MG CR tablet Commonly known as: TYLENOL  Take 1,300 mg by mouth every 8 (eight) hours as needed for pain.   glycopyrrolate 1 MG tablet Commonly known as: ROBINUL Take 1 tablet (1 mg total) by mouth every 4 (four) hours as needed (excessive secretions).   haloperidol  2 MG/ML solution Commonly known as: HALDOL  Place 0.3 mLs (0.6 mg total) under the tongue every 4 (four) hours as needed for agitation (or delirium).   HYDROmorphone  2 MG tablet Commonly known as: DILAUDID  Take 1 tablet (2 mg total) by mouth every 4 (four) hours as needed for severe pain (  pain score 7-10). May crush, mix with water  and give sublingually if needed.   LORazepam  2 MG/ML concentrated solution Commonly known as: ATIVAN  Place 0.5 mLs (1 mg total) under the tongue every 4 (four) hours as needed for anxiety.   omeprazole  20 MG capsule Commonly known as: PRILOSEC Take 1 capsule (20 mg total) by mouth daily.   ondansetron  4 MG disintegrating tablet Commonly known as: ZOFRAN -ODT Take 1 tablet (4 mg total) by mouth every 6 (six)  hours as needed for nausea.   polyethylene glycol 17 g packet Commonly known as: MIRALAX  / GLYCOLAX  Take 17 g by mouth daily as needed for mild constipation.   polyvinyl alcohol 1.4 % ophthalmic solution Commonly known as: LIQUIFILM TEARS Place 1 drop into both eyes 4 (four) times daily as needed for dry eyes.        Follow-up Information     Gollan, Timothy J, MD. Schedule an appointment as soon as possible for a visit.   Specialty: Cardiology Contact information: [address] [place] [address] [phone]                Discharge Exam: Jill Snow Weights   12/27/23 1304 12/27/23 1800  Weight: 75 kg 70.9 kg   General.  Frail and ill-appearing elderly lady, in no acute distress. Pulmonary.  Lungs clear bilaterally, normal respiratory effort. CV.  Regular rate and rhythm, no JVD, rub or murmur. Abdomen.  Soft, nontender, nondistended, BS positive. CNS.  Sleeping today. Extremities.  No edema, no cyanosis, pulses intact and symmetrical.  Condition at discharge: stable  The results of significant diagnostics from this hospitalization (including imaging, microbiology, ancillary and laboratory) are listed below for reference.   Imaging Studies: MR BRAIN WO CONTRAST Result Date: 12/28/2023 CLINICAL DATA:  Generalized weakness and aphasia EXAM: MRI HEAD WITHOUT CONTRAST TECHNIQUE: Multiplanar, multiecho pulse sequences of the brain and surrounding structures were obtained without intravenous contrast. COMPARISON:  03/22/2023 FINDINGS: Brain: No acute infarct, mass effect or extra-axial collection. No acute or chronic hemorrhage. There is multifocal hyperintense T2-weighted signal within the white matter. Generalized volume loss. The midline structures are normal. Vascular: Normal flow voids. Skull and upper cervical spine: Normal calvarium and skull base. Visualized upper cervical spine and soft tissues are normal. Sinuses/Orbits:No paranasal sinus fluid  levels or advanced mucosal thickening. No mastoid or middle ear effusion. Normal orbits. Markedly motion degraded examination. IMPRESSION: 1. No acute intracranial abnormality. 2. Findings of chronic small vessel ischemia and volume loss. Electronically Signed   By: Juanetta Nordmann M.D.   On: 12/28/2023 01:25   CT Head Wo Contrast Result Date: 12/27/2023 CLINICAL DATA:  Mental status change, unknown cause Lethargy. EXAM: CT HEAD WITHOUT CONTRAST TECHNIQUE: Contiguous axial images were obtained from the base of the skull through the vertex without intravenous contrast. RADIATION DOSE REDUCTION: This exam was performed according to the departmental dose-optimization program which includes automated exposure control, adjustment of the mA and/or kV according to patient size and/or use of iterative reconstruction technique. COMPARISON:  12/17/2023 FINDINGS: Brain: No intracranial hemorrhage, mass effect, or midline shift. Stable degree of atrophy and chronic small vessel ischemia. No hydrocephalus. The basilar cisterns are patent. No evidence of territorial infarct or acute ischemia. No extra-axial or intracranial fluid collection. Vascular: Atherosclerosis of skullbase vasculature without hyperdense vessel or abnormal calcification. Skull: No fracture or focal lesion. Sinuses/Orbits: No acute findings. Mild chronic cortical thickening of the right maxillary sinus. Other: None. IMPRESSION: 1. No acute intracranial abnormality. 2. Stable  atrophy and chronic small vessel ischemia. Electronically Signed   By: Chadwick Colonel M.D.   On: 12/27/2023 15:10   DG Chest 2 View Result Date: 12/27/2023 CLINICAL DATA:  Weakness.  Possibly TI EXAM: CHEST - 2 VIEW COMPARISON:  X-ray 12/03/2023 FINDINGS: Enlarged cardiopericardial silhouette. No pneumothorax or edema. There is some linear opacity lung bases likely scar or atelectasis. No consolidation. Known large hiatal hernia. Air-fluid level along the herniated stomach.  Degenerative changes along the spine. IMPRESSION: Underinflation.  Basilar atelectasis or scar. Known large hiatal hernia. Electronically Signed   By: Adrianna Horde M.D.   On: 12/27/2023 13:11   CT HEAD WO CONTRAST ( ) Result Date: 12/17/2023 CLINICAL DATA:  Head trauma, minor (Age >= 65y); Neck trauma (Age >= 65y) EXAM: CT HEAD WITHOUT CONTRAST CT CERVICAL SPINE WITHOUT CONTRAST TECHNIQUE: Multidetector CT imaging of the head and cervical spine was performed following the standard protocol without intravenous contrast. Multiplanar CT image reconstructions of the cervical spine were also generated. RADIATION DOSE REDUCTION: This exam was performed according to the departmental dose-optimization program which includes automated exposure control, adjustment of the mA and/or kV according to patient size and/or use of iterative reconstruction technique. COMPARISON:  CT scan head from 11/29/2023 and CT scan head and cervical spine from 02/10/2023. FINDINGS: CT HEAD FINDINGS Brain: No evidence of acute infarction, hemorrhage, hydrocephalus, extra-axial collection or mass lesion/mass effect. There is bilateral periventricular hypodensity, which is non-specific but most likely seen in the settings of microvascular ischemic changes. Mild in extent. Otherwise normal appearance of brain parenchyma. Cerebral volume loss with enlargement of the ventricles. Vascular: No hyperdense vessel or unexpected calcification. Intracranial arteriosclerosis. Skull: Normal. Negative for fracture or focal lesion. Sinuses/Orbits: No acute finding. Asymmetrically thickened wall of visualized small portion of right maxillary sinus may suggest sequela of chronic sinusitis. Other: Visualized mastoid air cells are unremarkable. No mastoid effusion. CT CERVICAL SPINE FINDINGS Alignment: There is reversal of cervical lordosis. There is minimal/grade 1 anterolisthesis of C3 over C4 and C4 over C5. There is minimal retrolisthesis of C5 over C6. These  are likely degenerative. Please note, this examination does not assess for ligamentous injury or stability. Skull base and vertebrae: No acute fracture. No primary bone lesion or focal pathologic process. Soft tissues and spinal canal: No prevertebral fluid or swelling. No visible canal hematoma. Disc levels: Moderate multilevel degenerative changes characterized by reduced intervertebral disc height, facet arthropathy and marginal osteophyte formation. Upper chest: Negative. Other: Asymmetrically enlarged left thyroid  lobe secondary to at least 2.1 x 3.1 cm hypoattenuating nodule, which is incompletely imaged on the current exam. This is incompletely characterized on the current exam and appears grossly similar to the prior CT scan. Please refer to prior thyroid  ultrasound and biopsy from year 2018 for details. IMPRESSION: 1. No acute intracranial abnormality. 2. No acute osseous injury or traumatic listhesis of the cervical spine. 3. Multiple other nonacute observations, as described above. Electronically Signed   By: Beula Brunswick M.D.   On: 12/17/2023 09:33   CT Cervical Spine Wo Contrast Result Date: 12/17/2023 CLINICAL DATA:  Head trauma, minor (Age >= 65y); Neck trauma (Age >= 65y) EXAM: CT HEAD WITHOUT CONTRAST CT CERVICAL SPINE WITHOUT CONTRAST TECHNIQUE: Multidetector CT imaging of the head and cervical spine was performed following the standard protocol without intravenous contrast. Multiplanar CT image reconstructions of the cervical spine were also generated. RADIATION DOSE REDUCTION: This exam was performed according to the departmental dose-optimization program which includes automated exposure control, adjustment of  the mA and/or kV according to patient size and/or use of iterative reconstruction technique. COMPARISON:  CT scan head from 11/29/2023 and CT scan head and cervical spine from 02/10/2023. FINDINGS: CT HEAD FINDINGS Brain: No evidence of acute infarction, hemorrhage, hydrocephalus,  extra-axial collection or mass lesion/mass effect. There is bilateral periventricular hypodensity, which is non-specific but most likely seen in the settings of microvascular ischemic changes. Mild in extent. Otherwise normal appearance of brain parenchyma. Cerebral volume loss with enlargement of the ventricles. Vascular: No hyperdense vessel or unexpected calcification. Intracranial arteriosclerosis. Skull: Normal. Negative for fracture or focal lesion. Sinuses/Orbits: No acute finding. Asymmetrically thickened wall of visualized small portion of right maxillary sinus may suggest sequela of chronic sinusitis. Other: Visualized mastoid air cells are unremarkable. No mastoid effusion. CT CERVICAL SPINE FINDINGS Alignment: There is reversal of cervical lordosis. There is minimal/grade 1 anterolisthesis of C3 over C4 and C4 over C5. There is minimal retrolisthesis of C5 over C6. These are likely degenerative. Please note, this examination does not assess for ligamentous injury or stability. Skull base and vertebrae: No acute fracture. No primary bone lesion or focal pathologic process. Soft tissues and spinal canal: No prevertebral fluid or swelling. No visible canal hematoma. Disc levels: Moderate multilevel degenerative changes characterized by reduced intervertebral disc height, facet arthropathy and marginal osteophyte formation. Upper chest: Negative. Other: Asymmetrically enlarged left thyroid  lobe secondary to at least 2.1 x 3.1 cm hypoattenuating nodule, which is incompletely imaged on the current exam. This is incompletely characterized on the current exam and appears grossly similar to the prior CT scan. Please refer to prior thyroid  ultrasound and biopsy from year 2018 for details. IMPRESSION: 1. No acute intracranial abnormality. 2. No acute osseous injury or traumatic listhesis of the cervical spine. 3. Multiple other nonacute observations, as described above. Electronically Signed   By: Beula Brunswick  M.D.   On: 12/17/2023 09:33   CT Renal Stone Study Result Date: 12/17/2023 CLINICAL DATA:  Abdominal/flank pain, stone suspected. EXAM: CT ABDOMEN AND PELVIS WITHOUT CONTRAST TECHNIQUE: Multidetector CT imaging of the abdomen and pelvis was performed following the standard protocol without IV contrast. RADIATION DOSE REDUCTION: This exam was performed according to the departmental dose-optimization program which includes automated exposure control, adjustment of the mA and/or kV according to patient size and/or use of iterative reconstruction technique. COMPARISON:  CT scan abdomen and pelvis from 03/11/2020. FINDINGS: Lower chest: Redemonstration of moderate-to-large posteromedial diaphragmatic hernia containing majority of the stomach. There are atelectatic changes in the bilateral lower lobes with associated trace pleural effusions. No mass or consolidation. No suspicious lung nodule. The heart is normal in size. No pericardial effusion. Hepatobiliary: The liver is normal in size. Non-cirrhotic configuration. No suspicious mass. No intrahepatic or extrahepatic bile duct dilation. Small amount of layering gallstones/sludge noted without imaging signs of acute cholecystitis. Normal gallbladder wall thickness. No pericholecystic inflammatory changes. Pancreas: Unremarkable. No pancreatic ductal dilatation or surrounding inflammatory changes. Spleen: Within normal limits. No focal lesion. Adrenals/Urinary Tract: Adrenal glands are unremarkable. No suspicious renal mass within the limitations of this unenhanced exam. No hydroureteronephrosis or nephroureterolithiasis. Unremarkable urinary bladder. Stomach/Bowel: No disproportionate dilation of the small or large bowel loops. No evidence of abnormal bowel wall thickening or inflammatory changes. The appendix was not visualized; however there is no acute inflammatory process in the right lower quadrant. Moderate-to-large amount of stool burden noted throughout the  colon. Vascular/Lymphatic: No ascites or pneumoperitoneum. No abdominal or pelvic lymphadenopathy, by size criteria. No aneurysmal dilation of  the major abdominal arteries. There are moderate peripheral atherosclerotic vascular calcifications of the aorta and its major branches. Reproductive: The uterus is surgically absent. No large adnexal mass. Other: There is a small fat containing periumbilical hernia. There also bilateral small fat containing inguinal hernias. The soft tissues and abdominal wall are otherwise unremarkable. Musculoskeletal: No suspicious osseous lesions. There are moderate multilevel degenerative changes in the visualized spine. IMPRESSION: 1. No nephroureterolithiasis or obstructive uropathy. 2. No acute inflammatory process identified within the abdomen or pelvis. 3. Atelectatic changes in the bilateral lung lower lobes with associated trace bilateral pleural effusions. No mass or consolidation. 4. Multiple chronic nonemergent observations, as described above. Aortic Atherosclerosis (ICD10-I70.0). Electronically Signed   By: Beula Brunswick M.D.   On: 12/17/2023 09:26   DG Chest Port 1 View Result Date: 12/03/2023 CLINICAL DATA:  Tachycardia EXAM: PORTABLE CHEST 1 VIEW COMPARISON:  Chest radiograph dated 11/29/2023 FINDINGS: Low lung volumes with bronchovascular crowding. Again seen is large hiatal hernia within the left lower thorax. Increased dense left retrocardiac opacity. No pleural effusion or pneumothorax. Similar cardiomediastinal silhouette. No acute osseous abnormality. IMPRESSION: 1. Increased dense left retrocardiac opacity, likely atelectasis. Aspiration or pneumonia can be considered in the appropriate clinical setting. 2. Large hiatal hernia. Electronically Signed   By: Limin  Xu M.D.   On: 12/03/2023 14:28    Microbiology: Results for orders placed or performed during the hospital encounter of 12/27/23  Urine Culture     Status: Abnormal   Collection Time: 12/27/23   2:42 PM   Specimen: Urine, Random  Result Value Ref Range Status   Specimen Description   Final    URINE, RANDOM Performed at [facility], [address] [place], [place] [address]    Special Requests   Final    NONE Reflexed from [id number] Performed at [facility], [address] [place], [place] [address]    Culture >=100,000 COLONIES/mL ENTEROCOCCUS FAECALIS (A)  Final   Report Status 12/29/2023 FINAL  Final   Organism ID, Bacteria ENTEROCOCCUS FAECALIS (A)  Final      Susceptibility   Enterococcus faecalis - MIC*    AMPICILLIN <=2 SENSITIVE Sensitive     NITROFURANTOIN <=16 SENSITIVE Sensitive     VANCOMYCIN  1 SENSITIVE Sensitive     * >=100,000 COLONIES/mL ENTEROCOCCUS FAECALIS  MRSA Next Gen by PCR, Nasal     Status: None   Collection Time: 12/27/23  6:08 PM   Specimen: Nasal Mucosa; Nasal Swab  Result Value Ref Range Status   MRSA by PCR Next Gen NOT DETECTED NOT DETECTED Final    Comment: (NOTE) The GeneXpert MRSA Assay (FDA approved for NASAL specimens only), is one component of a comprehensive MRSA colonization surveillance program. It is not intended to diagnose MRSA infection nor to guide or monitor treatment for MRSA infections. Test performance is not FDA approved in patients less than 77 years old. Performed at [facility], [address] [place], [place] [address]     Labs: CBC: Recent Labs  Lab 12/27/23 1223 12/27/23 1722  WBC 3.5* 3.1*  NEUTROABS 2.6  --   HGB 11.9* 11.7*  HCT 37.7 35.6*  MCV 99.5 97.8  PLT 221 213   Basic Metabolic Panel: Recent Labs  Lab 12/27/23 1223 12/27/23 1638 12/27/23 1722  NA 134*  --  136  K 5.2*  --  4.8  CL 102  --  103  CO2 27  --  24  GLUCOSE 134*  --  119*  BUN 20  --  19  CREATININE 1.06*  --  0.91  CALCIUM  9.3  --  9.3  MG  --  2.6* 2.5*  PHOS  --  3.7 3.7   Liver Function Tests: Recent Labs  Lab 12/27/23 1223 12/27/23 1722  AST 34 31  ALT 34 31  ALKPHOS 86 80   BILITOT 0.9 1.0  PROT 7.1 6.8  ALBUMIN 3.7 3.4*   CBG: Recent Labs  Lab 12/27/23 1706 12/27/23 1803  GLUCAP 117* 125*    Discharge time spent: greater than 30 minutes.  This record has been created using Conservation officer, historic buildings. Errors have been sought and corrected,but may not always be located. Such creation errors do not reflect on the standard of care.   Signed: Luna Salinas, MD [facility] 12/31/2023

## 2023-12-31 NOTE — TOC Transition Note (Addendum)
 Transition of Care [facility]) - Discharge Note   Patient Details  Name: Jill Snow MRN: [id number] Date of Birth: [birth year]  [facility] CM/SW Contact:  Crayton Docker, RN 12/31/2023, 10:48 AM   Clinical Narrative:     Discharge orders noted for home with hospice. [facility] is following for hospice care at home. Per Rebbecca Campion, DME will arrive between 1200-1400 and RN hospice visit scheduled today for 1430. Patient will discharge to patient's daughter's [name] residence, [address] [place], [place] [address]. Patient's daughter, [name] will provide transportation home at 1130. CM alert to [employer].   Alert received from [employer], patient is mental status is altered and request now is for BLS transport. CM call to [facility]. CM spoke to [place]. BLS transport scheduled for 1300. CM alert to Dr. Ariel Begun and RN Ennis Hart regarding BLS transport. CM call to patient's daughter, [name] regarding BLS transport pick up time of 1300. Patient's daughter verbalized understanding.  Final next level of care: Home w Hospice Care Barriers to Discharge: No Barriers Identified   Patient Goals and CMS Choice    Home with hospice  Discharge Placement     Home with hospice             Discharge Plan and Services Additional resources added to the After Visit Summary for     Social Drivers of Health (SDOH) Interventions SDOH Screenings   Food Insecurity: No Food Insecurity (12/27/2023)  Housing: Low Risk  (12/27/2023)  Transportation Needs: No Transportation Needs (12/27/2023)  Utilities: Not At Risk (12/27/2023)  Depression (PHQ2-9): Low Risk  (11/10/2022)  Financial Resource Strain: Low Risk  (06/02/2021)  Physical Activity: Insufficiently Active (06/02/2021)  Social Connections: Socially Isolated (12/27/2023)  Stress: No Stress Concern Present (06/02/2021)  Tobacco Use: Low Risk  (12/27/2023)     Readmission Risk Interventions     No data to display

## 2023-12-31 NOTE — Progress Notes (Signed)
 Daily Progress Note   Patient Name: Jill Snow       Date: 12/31/2023 DOB: 05/26/1934  Age: 89 y.o. MRN#: [id number] Attending Physician: Luna Salinas, MD Primary Care Physician: Devorah Fonder, MD Admit Date: 12/27/2023  Reason for Consultation/Follow-up: Establishing goals of care  Subjective: Notes reviewed.  Spoke with nursing for 1C who states patient had a difficult night and was agitated.  Into see patient at this time.  She is calm and cooperative, with no distress noted.  She opened her eyes briefly upon speaking to her and then closed them again.  Currently patient's granddaughter is at bedside.  Questions answered regarding hospice level care at home.  Attending into speak with patient and family regarding discharge planning.  Discussed discharge symptom management.  PMT will sign off as goals are set.  Length of Stay: 3  Current Medications: Scheduled Meds:   sodium chloride  flush  3-10 mL Intravenous Q12H    Continuous Infusions:   PRN Meds: acetaminophen  **OR** acetaminophen , diphenhydrAMINE, glycopyrrolate **OR** glycopyrrolate **OR** glycopyrrolate, haloperidol  **OR** haloperidol  **OR** haloperidol  lactate, HYDROmorphone  (DILAUDID ) injection, LORazepam  **OR** LORazepam  **OR** LORazepam , ondansetron  **OR** ondansetron  (ZOFRAN ) IV, polyvinyl alcohol, sodium chloride  flush  Physical Exam Constitutional:      Comments: Opens eyes briefly to voice.  No distress noted.  Pulmonary:     Effort: Pulmonary effort is normal.  Skin:    General: Skin is warm and dry.             Vital Signs: BP (!) 131/48 (BP Location: Left Arm)   Pulse (!) 54   Temp 97.6 F (36.4 C)   Resp 16   Ht 5\' 6"  (1.676 m)   Wt 70.9 kg   SpO2 97%   BMI 25.23 kg/m  SpO2: SpO2: 97  % O2 Device: O2 Device: Room Air O2 Flow Rate:    Intake/output summary:  Intake/Output Summary (Last 24 hours) at 12/31/2023 1256 Last data filed at 12/31/2023 1050 Gross per 24 hour  Intake 0 ml  Output --  Net 0 ml   LBM: Last BM Date : 12/17/23 Baseline Weight: Weight: 75 kg Most recent weight: Weight: 70.9 kg       Patient Active Problem List   Diagnosis Date Noted   Aphasia 12/28/2023   Generalized weakness 12/27/2023   Acute  metabolic encephalopathy 12/22/2023   Impaired ambulation 12/17/2023   Chronic a-fib (HCC) 12/10/2023   Weakness 12/08/2023   Goals of care, counseling/discussion 08/23/2023   Sepsis secondary to UTI (HCC) 12/26/2022   UTI (urinary tract infection) 12/25/2022   Altered mental status 12/25/2022   Venous insufficiency 11/10/2022   Late onset Alzheimer's dementia with behavioral disturbance (HCC) 06/03/2021   Tremor of right hand 05/26/2021   Dry eyes 12/24/2020   Chronic diastolic CHF (congestive heart failure) (HCC) 12/16/2020   History of non anemic vitamin B12 deficiency 06/27/2018   Aortic atherosclerosis (HCC) 04/06/2018   Allergic rhinitis 05/19/2017   Lung nodule 12/10/2016   Fatty liver 04/22/2016   Neuropathy 02/11/2016   Mitral regurgitation    Vitamin D  deficiency 08/29/2014   Obstructive sleep apnea on CPAP 03/07/2014   HTN (hypertension)    Hypercholesterolemia    RBBB (right bundle branch block)     Palliative Care Assessment & Plan   Recommendations/Plan: Home with hospice today. PMT sign off.  Code Status:    Code Status Orders  (From admission, onward)           Start     Ordered   12/28/23 2025  Do not attempt resuscitation (DNR) - Comfort care  Continuous       Question Answer Comment  If patient has no pulse and is not breathing Do Not Attempt Resuscitation   In Pre-Arrest Conditions (Patient Is Breathing and Has a Pulse) Provide comfort measures. Relieve any mechanical airway obstruction. Avoid transfer  unless required for comfort.   Consent: Discussion documented in EHR or advanced directives reviewed      12/28/23 2026           Code Status History     Date Active Date Inactive Code Status Order ID Comments User Context   12/28/2023 1015 12/28/2023 2026 Do not attempt resuscitation (DNR) - Comfort care [id number]  Brenna Cam, MD Inpatient   12/27/2023 1537 12/28/2023 1015 Limited: Do not attempt resuscitation (DNR) -DNR-LIMITED -Do Not Intubate/DNI  [id number]  Vita Grip, MD ED   12/17/2023 1159 12/23/2023 0101 Limited: Do not attempt resuscitation (DNR) -DNR-LIMITED -Do Not Intubate/DNI  [id number]  [place], MD ED   11/30/2023 0102 12/11/2023 0103 Limited: Do not attempt resuscitation (DNR) -DNR-LIMITED -Do Not Intubate/DNI  [id number]  Fidencio Hue, MD ED   11/30/2023 0012 11/30/2023 0102 Full Code [id number]  Fidencio Hue, MD ED   02/10/2023 1425 02/14/2023 1531 Full Code [id number]  [place], MD ED   12/25/2022 1615 12/27/2022 2053 Full Code [id number]  Johnetta Nab, MD ED   12/16/2020 1459 12/17/2020 1610 Full Code [id number]  Lorita Rosa, MD ED   04/23/2020 1548 04/25/2020 2239 Full Code [id number]  Cleve Dale, MD ED   11/30/2016 2340 12/03/2016 2213 Full Code [id number]  Enrigue Harvard, DO Inpatient       Care plan was discussed with attending MD  Thank you for allowing the Palliative Medicine Team to assist in the care of this patient.   Meribeth Standard, NP  Please contact Palliative Medicine Team phone at [phone] for questions and concerns.

## 2023-12-31 NOTE — Care Management Important Message (Signed)
 Important Message  Patient Details  Name: DIAMANI BERGES MRN: [id number] Date of Birth: September 05, 1933   Important Message Given:  Yes - Medicare IM     Anise Kerns 12/31/2023, 3:28 PM

## 2024-01-13 ENCOUNTER — Ambulatory Visit: Attending: Cardiology | Admitting: Cardiology

## 2024-01-13 NOTE — Progress Notes (Deleted)
 Cardiology Office Note:  .   Date:  01/13/2024  ID:  Jill Snow, DOB 1934-04-15, MRN [id number] PCP: Devorah Fonder, MD  [facility] Providers Cardiologist:  None { Click to update primary MD,subspecialty MD or APP then REFRESH:1}   History of Present Illness: .   Jill Snow is a 89 y.o. female with a past medical history of advanced dementia, hypertension, hyperlipidemia, diastolic dysfunction, atrial fibrillation/flutter on chronic apixaban , chronic right bundle branch block, who is being seen today after recent hospitalization for atrial fibrillation flutter with RVR.   She has longstanding history of atrial fibrillation dating back to 04/2013.  At that time she required a TEE cardioversion due to difficult to control rates.  Echocardiogram revealed LVEF of 45 to 50% with mildly dilated LA/RA, moderate MR.  Was started on DOAC at that time but that was later discontinued.  Restarted on DOAC 01/2020.  Hospitalized 04/2020 for atrial fibrillation with RVR.  Converted to normal sinus rhythm with IV amiodarone .  She was discharged on oral amiodarone  and apixaban .  She was last seen in clinic by Dr. Gollan in 9/24 and was overall doing well from a cardiac perspective.  She was maintaining sinus rhythm.  She little longer was on beta-blocker secondary to bradycardia but was continued on apixaban  and amiodarone .   She presented to the [facility] emergency department/14/25 for weakness and increased confusion with agitation in addition to foul-smelling urine.  She was admitted for UTI with urine culture positive for multiple speciation.  She been treated with antibiotics.  Hospitalization was complicated by increased confusion and behavioral disturbances requiring as needed Haldol  and Ativan .  She was noted to have tachycardia overnight on 4/17.  EKG done read as sinus tachycardia rate of 140 bpm although could be due to 1 atrial flutter.  Repeat EKG done on 4/20 showed atrial  fibrillation.  She was initially given IV diltiazem  with improvement in rates however went back in RVR was started on IV diltiazem  drip.  Patient converted back into sinus rhythm on 4/21 and she was tapered off of the total drip.  Comfort care measures were started and patient remained stable.  Intermittent agitation more consistent with sundowning.  She was evaluated by hospice services during hospitalization as family was interested in hospice facility but unfortunately she did not qualify.  She was considered stable for discharge on 12/31/23  ROS: ***  Studies Reviewed: .        02/11/2023 Echo complete 1. Left ventricular ejection fraction, by estimation, is 60 to 65%. The  left ventricle has normal function. The left ventricle has no regional  wall motion abnormalities. Left ventricular diastolic parameters are  consistent with Grade II diastolic  dysfunction (pseudonormalization).   2. Right ventricular systolic function is normal. The right ventricular  size is normal. Tricuspid regurgitation signal is inadequate for assessing  PA pressure.   3. Left atrial size was moderately dilated.   4. The mitral valve is degenerative. Mild to moderate mitral valve  regurgitation.   5. The aortic valve is tricuspid. Aortic valve regurgitation is not  visualized. Aortic valve sclerosis/calcification is present, without any  evidence of aortic stenosis.  Risk Assessment/Calculations:    CHA2DS2-VASc Score =    {Click here to calculate score.  REFRESH note before signing. :1} This indicates a  % annual risk of stroke. The patient's score is based upon:      No BP recorded.  {Refresh Note OR Click here to enter  BP  :1}***       Physical Exam:   VS:  There were no vitals taken for this visit.   Wt Readings from Last 3 Encounters:  12/27/23 156 lb 4.9 oz (70.9 kg)  12/17/23 163 lb 5.8 oz (74.1 kg)  12/10/23 158 lb 8 oz (71.9 kg)    GEN: Well nourished, well developed in no acute  distress NECK: No JVD; No carotid bruits CARDIAC: ***RRR, no murmurs, rubs, gallops RESPIRATORY:  Clear to auscultation without rales, wheezing or rhonchi  ABDOMEN: Soft, non-tender, non-distended EXTREMITIES:  No edema; No deformity   ASSESSMENT AND PLAN: .   ***    {Are you ordering a CV Procedure (e.g. stress test, cath, DCCV, TEE, etc)?   Press F2        :[id number]}  Dispo: ***  Signed, Ahuva Poynor, NP

## 2024-01-17 ENCOUNTER — Encounter: Payer: Self-pay | Admitting: Cardiovascular Disease

## 2024-01-21 ENCOUNTER — Telehealth: Payer: Self-pay | Admitting: Cardiovascular Disease

## 2024-01-21 NOTE — Telephone Encounter (Signed)
 Jill Snow is requesting a callback regarding home health order number [id number] and resumption of care order number [id number]. Both faxed to [phone] on 5/8 and 5/19. She'd like to f/u. Please advise.

## 2024-01-21 NOTE — Telephone Encounter (Signed)
 Called [facility], advised that forms were more than likely in the box for MD to sign, however he was out of office return date on the 16th of June, and we would get him to sign once he was back in office.   Wallene Gum, verbalized understanding and would place a note in the chart.

## 2024-02-03 NOTE — Telephone Encounter (Signed)
 Form given to Dr. Gollan for review.

## 2024-02-11 NOTE — Telephone Encounter (Signed)
Form faxed to [facility]

## 2024-02-21 ENCOUNTER — Encounter: Payer: Medicare HMO | Admitting: Family Medicine

## 2024-04-06 DIAGNOSIS — T68XXXA Hypothermia, initial encounter: Secondary | ICD-10-CM

## 2024-04-06 DIAGNOSIS — R001 Bradycardia, unspecified: Secondary | ICD-10-CM

## 2024-04-06 DIAGNOSIS — I959 Hypotension, unspecified: Secondary | ICD-10-CM
